# Patient Record
Sex: Female | Born: 1935 | Race: White | Hispanic: No | Marital: Married | Smoking: Never smoker
Health system: Southern US, Community
[De-identification: ages and names within clinical notes are randomized; demographics above are authoritative.]

## PROBLEM LIST (undated history)

## (undated) DIAGNOSIS — N281 Cyst of kidney, acquired: Secondary | ICD-10-CM

## (undated) DIAGNOSIS — I10 Essential (primary) hypertension: Secondary | ICD-10-CM

## (undated) DIAGNOSIS — E785 Hyperlipidemia, unspecified: Secondary | ICD-10-CM

## (undated) DIAGNOSIS — D649 Anemia, unspecified: Secondary | ICD-10-CM

## (undated) DIAGNOSIS — K449 Diaphragmatic hernia without obstruction or gangrene: Secondary | ICD-10-CM

## (undated) DIAGNOSIS — I2510 Atherosclerotic heart disease of native coronary artery without angina pectoris: Secondary | ICD-10-CM

## (undated) DIAGNOSIS — M545 Low back pain, unspecified: Secondary | ICD-10-CM

## (undated) DIAGNOSIS — R51 Headache: Secondary | ICD-10-CM

## (undated) DIAGNOSIS — F419 Anxiety disorder, unspecified: Secondary | ICD-10-CM

## (undated) DIAGNOSIS — K219 Gastro-esophageal reflux disease without esophagitis: Secondary | ICD-10-CM

## (undated) DIAGNOSIS — K5790 Diverticulosis of intestine, part unspecified, without perforation or abscess without bleeding: Secondary | ICD-10-CM

## (undated) DIAGNOSIS — T7840XA Allergy, unspecified, initial encounter: Secondary | ICD-10-CM

## (undated) DIAGNOSIS — I872 Venous insufficiency (chronic) (peripheral): Secondary | ICD-10-CM

## (undated) DIAGNOSIS — D126 Benign neoplasm of colon, unspecified: Secondary | ICD-10-CM

## (undated) DIAGNOSIS — K56609 Unspecified intestinal obstruction, unspecified as to partial versus complete obstruction: Secondary | ICD-10-CM

## (undated) DIAGNOSIS — M1990 Unspecified osteoarthritis, unspecified site: Secondary | ICD-10-CM

## (undated) DIAGNOSIS — G8929 Other chronic pain: Secondary | ICD-10-CM

## (undated) DIAGNOSIS — K589 Irritable bowel syndrome without diarrhea: Secondary | ICD-10-CM

## (undated) HISTORY — DX: Cyst of kidney, acquired: N28.1

## (undated) HISTORY — DX: Gastro-esophageal reflux disease without esophagitis: K21.9

## (undated) HISTORY — DX: Irritable bowel syndrome without diarrhea: K58.9

## (undated) HISTORY — DX: Low back pain, unspecified: M54.50

## (undated) HISTORY — DX: Hyperlipidemia, unspecified: E78.5

## (undated) HISTORY — DX: Low back pain: M54.5

## (undated) HISTORY — DX: Diaphragmatic hernia without obstruction or gangrene: K44.9

## (undated) HISTORY — DX: Headache: R51

## (undated) HISTORY — DX: Essential (primary) hypertension: I10

## (undated) HISTORY — DX: Anemia, unspecified: D64.9

## (undated) HISTORY — DX: Anxiety disorder, unspecified: F41.9

## (undated) HISTORY — DX: Unspecified osteoarthritis, unspecified site: M19.90

## (undated) HISTORY — DX: Atherosclerotic heart disease of native coronary artery without angina pectoris: I25.10

## (undated) HISTORY — DX: Diverticulosis of intestine, part unspecified, without perforation or abscess without bleeding: K57.90

## (undated) HISTORY — PX: ABDOMINAL HYSTERECTOMY: SHX81

## (undated) HISTORY — PX: VARICOSE VEIN SURGERY: SHX832

## (undated) HISTORY — PX: COLONOSCOPY W/ BIOPSIES: SHX1374

## (undated) HISTORY — DX: Benign neoplasm of colon, unspecified: D12.6

## (undated) HISTORY — DX: Allergy, unspecified, initial encounter: T78.40XA

## (undated) HISTORY — DX: Venous insufficiency (chronic) (peripheral): I87.2

## (undated) HISTORY — PX: BLADDER REPAIR: SHX76

## (undated) HISTORY — DX: Unspecified intestinal obstruction, unspecified as to partial versus complete obstruction: K56.609

## (undated) HISTORY — DX: Other chronic pain: G89.29

## (undated) HISTORY — PX: CATARACT EXTRACTION: SUR2

---

## 1997-09-10 ENCOUNTER — Ambulatory Visit (HOSPITAL_COMMUNITY): Admission: RE | Admit: 1997-09-10 | Discharge: 1997-09-10 | Payer: Self-pay | Admitting: *Deleted

## 1998-02-02 ENCOUNTER — Other Ambulatory Visit: Admission: RE | Admit: 1998-02-02 | Discharge: 1998-02-02 | Payer: Self-pay | Admitting: Obstetrics and Gynecology

## 1999-03-12 ENCOUNTER — Encounter: Admission: RE | Admit: 1999-03-12 | Discharge: 1999-03-12 | Payer: Self-pay | Admitting: Obstetrics and Gynecology

## 1999-03-12 ENCOUNTER — Encounter: Payer: Self-pay | Admitting: Obstetrics and Gynecology

## 1999-12-16 ENCOUNTER — Ambulatory Visit (HOSPITAL_COMMUNITY): Admission: RE | Admit: 1999-12-16 | Discharge: 1999-12-16 | Payer: Self-pay

## 2000-01-14 ENCOUNTER — Other Ambulatory Visit: Admission: RE | Admit: 2000-01-14 | Discharge: 2000-01-14 | Payer: Self-pay | Admitting: *Deleted

## 2000-03-13 ENCOUNTER — Encounter: Admission: RE | Admit: 2000-03-13 | Discharge: 2000-03-13 | Payer: Self-pay | Admitting: Obstetrics and Gynecology

## 2000-03-13 ENCOUNTER — Encounter: Payer: Self-pay | Admitting: Obstetrics and Gynecology

## 2000-06-30 ENCOUNTER — Encounter (INDEPENDENT_AMBULATORY_CARE_PROVIDER_SITE_OTHER): Payer: Self-pay | Admitting: Specialist

## 2000-06-30 ENCOUNTER — Other Ambulatory Visit: Admission: RE | Admit: 2000-06-30 | Discharge: 2000-06-30 | Payer: Self-pay | Admitting: Gastroenterology

## 2001-03-15 ENCOUNTER — Encounter: Payer: Self-pay | Admitting: Obstetrics and Gynecology

## 2001-03-15 ENCOUNTER — Encounter: Admission: RE | Admit: 2001-03-15 | Discharge: 2001-03-15 | Payer: Self-pay | Admitting: Obstetrics and Gynecology

## 2001-09-10 ENCOUNTER — Other Ambulatory Visit: Admission: RE | Admit: 2001-09-10 | Discharge: 2001-09-10 | Payer: Self-pay | Admitting: Obstetrics and Gynecology

## 2002-01-12 ENCOUNTER — Ambulatory Visit (HOSPITAL_COMMUNITY): Admission: RE | Admit: 2002-01-12 | Discharge: 2002-01-13 | Payer: Self-pay | Admitting: Cardiology

## 2002-01-12 ENCOUNTER — Encounter: Payer: Self-pay | Admitting: Cardiology

## 2002-01-12 HISTORY — PX: CARDIAC CATHETERIZATION: SHX172

## 2002-03-16 ENCOUNTER — Encounter: Payer: Self-pay | Admitting: Obstetrics and Gynecology

## 2002-03-16 ENCOUNTER — Encounter: Admission: RE | Admit: 2002-03-16 | Discharge: 2002-03-16 | Payer: Self-pay | Admitting: Obstetrics and Gynecology

## 2003-05-12 ENCOUNTER — Encounter: Admission: RE | Admit: 2003-05-12 | Discharge: 2003-05-12 | Payer: Self-pay | Admitting: Obstetrics and Gynecology

## 2003-09-26 ENCOUNTER — Encounter (INDEPENDENT_AMBULATORY_CARE_PROVIDER_SITE_OTHER): Payer: Self-pay | Admitting: Gastroenterology

## 2003-11-17 ENCOUNTER — Ambulatory Visit (HOSPITAL_COMMUNITY): Admission: RE | Admit: 2003-11-17 | Discharge: 2003-11-17 | Payer: Self-pay | Admitting: Gastroenterology

## 2003-11-18 ENCOUNTER — Emergency Department (HOSPITAL_COMMUNITY): Admission: EM | Admit: 2003-11-18 | Discharge: 2003-11-18 | Payer: Self-pay | Admitting: Emergency Medicine

## 2003-11-24 ENCOUNTER — Ambulatory Visit (HOSPITAL_COMMUNITY): Admission: RE | Admit: 2003-11-24 | Discharge: 2003-11-24 | Payer: Self-pay | Admitting: Gastroenterology

## 2004-02-14 ENCOUNTER — Ambulatory Visit: Payer: Self-pay | Admitting: Pulmonary Disease

## 2004-05-13 ENCOUNTER — Encounter: Admission: RE | Admit: 2004-05-13 | Discharge: 2004-05-13 | Payer: Self-pay | Admitting: Obstetrics and Gynecology

## 2004-08-20 ENCOUNTER — Ambulatory Visit: Payer: Self-pay | Admitting: Pulmonary Disease

## 2004-09-02 ENCOUNTER — Ambulatory Visit: Payer: Self-pay | Admitting: Pulmonary Disease

## 2005-02-04 ENCOUNTER — Ambulatory Visit: Payer: Self-pay | Admitting: Pulmonary Disease

## 2005-03-03 ENCOUNTER — Ambulatory Visit: Payer: Self-pay | Admitting: Pulmonary Disease

## 2005-04-30 ENCOUNTER — Ambulatory Visit: Payer: Self-pay | Admitting: Pulmonary Disease

## 2005-05-14 ENCOUNTER — Encounter: Admission: RE | Admit: 2005-05-14 | Discharge: 2005-05-14 | Payer: Self-pay | Admitting: Obstetrics and Gynecology

## 2005-07-14 ENCOUNTER — Ambulatory Visit: Payer: Self-pay | Admitting: Pulmonary Disease

## 2005-07-21 ENCOUNTER — Ambulatory Visit: Payer: Self-pay | Admitting: Pulmonary Disease

## 2005-10-21 ENCOUNTER — Ambulatory Visit: Payer: Self-pay | Admitting: Pulmonary Disease

## 2006-02-03 ENCOUNTER — Ambulatory Visit: Payer: Self-pay | Admitting: Pulmonary Disease

## 2006-02-13 ENCOUNTER — Ambulatory Visit: Payer: Self-pay | Admitting: Pulmonary Disease

## 2006-05-27 ENCOUNTER — Encounter: Admission: RE | Admit: 2006-05-27 | Discharge: 2006-05-27 | Payer: Self-pay | Admitting: Obstetrics and Gynecology

## 2006-07-07 ENCOUNTER — Ambulatory Visit: Payer: Self-pay | Admitting: Pulmonary Disease

## 2006-09-15 ENCOUNTER — Ambulatory Visit: Payer: Self-pay | Admitting: Internal Medicine

## 2006-12-11 ENCOUNTER — Ambulatory Visit: Payer: Self-pay | Admitting: Pulmonary Disease

## 2006-12-28 ENCOUNTER — Ambulatory Visit: Payer: Self-pay | Admitting: Pulmonary Disease

## 2007-02-22 ENCOUNTER — Ambulatory Visit: Payer: Self-pay | Admitting: Pulmonary Disease

## 2007-03-08 ENCOUNTER — Ambulatory Visit: Payer: Self-pay | Admitting: Pulmonary Disease

## 2007-03-22 ENCOUNTER — Encounter: Payer: Self-pay | Admitting: Pulmonary Disease

## 2007-03-22 HISTORY — PX: CARDIOVASCULAR STRESS TEST: SHX262

## 2007-05-07 ENCOUNTER — Encounter: Admission: RE | Admit: 2007-05-07 | Discharge: 2007-05-07 | Payer: Self-pay | Admitting: Specialist

## 2007-06-16 ENCOUNTER — Encounter: Admission: RE | Admit: 2007-06-16 | Discharge: 2007-06-16 | Payer: Self-pay | Admitting: Obstetrics and Gynecology

## 2007-07-19 ENCOUNTER — Ambulatory Visit: Payer: Self-pay | Admitting: Internal Medicine

## 2007-07-19 DIAGNOSIS — M545 Low back pain, unspecified: Secondary | ICD-10-CM | POA: Insufficient documentation

## 2007-07-19 DIAGNOSIS — F411 Generalized anxiety disorder: Secondary | ICD-10-CM

## 2007-07-19 DIAGNOSIS — Z9861 Coronary angioplasty status: Secondary | ICD-10-CM

## 2007-07-19 DIAGNOSIS — E785 Hyperlipidemia, unspecified: Secondary | ICD-10-CM

## 2007-07-19 DIAGNOSIS — I10 Essential (primary) hypertension: Secondary | ICD-10-CM

## 2007-07-19 DIAGNOSIS — I2510 Atherosclerotic heart disease of native coronary artery without angina pectoris: Secondary | ICD-10-CM

## 2007-07-19 DIAGNOSIS — R51 Headache: Secondary | ICD-10-CM

## 2007-07-19 DIAGNOSIS — R519 Headache, unspecified: Secondary | ICD-10-CM | POA: Insufficient documentation

## 2007-07-19 DIAGNOSIS — I872 Venous insufficiency (chronic) (peripheral): Secondary | ICD-10-CM | POA: Insufficient documentation

## 2007-07-19 DIAGNOSIS — M1990 Unspecified osteoarthritis, unspecified site: Secondary | ICD-10-CM | POA: Insufficient documentation

## 2007-07-21 ENCOUNTER — Telehealth: Payer: Self-pay | Admitting: Adult Health

## 2007-08-25 ENCOUNTER — Telehealth: Payer: Self-pay | Admitting: Internal Medicine

## 2007-08-27 ENCOUNTER — Telehealth: Payer: Self-pay | Admitting: Internal Medicine

## 2007-09-03 ENCOUNTER — Telehealth: Payer: Self-pay | Admitting: Internal Medicine

## 2007-10-06 ENCOUNTER — Ambulatory Visit: Payer: Self-pay | Admitting: Internal Medicine

## 2007-10-06 DIAGNOSIS — J309 Allergic rhinitis, unspecified: Secondary | ICD-10-CM

## 2007-11-04 ENCOUNTER — Telehealth: Payer: Self-pay | Admitting: Pulmonary Disease

## 2007-11-09 ENCOUNTER — Ambulatory Visit: Payer: Self-pay | Admitting: Pulmonary Disease

## 2007-11-16 LAB — CONVERTED CEMR LAB
Albumin: 3.9 g/dL (ref 3.5–5.2)
Basophils Absolute: 0 10*3/uL (ref 0.0–0.1)
Cholesterol: 154 mg/dL (ref 0–200)
Eosinophils Absolute: 0.1 10*3/uL (ref 0.0–0.7)
Eosinophils Relative: 2.6 % (ref 0.0–5.0)
HDL: 41.3 mg/dL (ref >39.0)
Hemoglobin, Urine: NEGATIVE
Leukocytes, UA: NEGATIVE
Lymphocytes Relative: 36 % (ref 12.0–46.0)
MCV: 92.1 fL (ref 78.0–100.0)
Monocytes Absolute: 0.4 10*3/uL (ref 0.1–1.0)
Monocytes Relative: 7.9 % (ref 3.0–12.0)
Neutro Abs: 2.8 10*3/uL (ref 1.4–7.7)
Nitrite: NEGATIVE
RDW: 14.1 % (ref 11.5–14.6)
Sodium: 144 meq/L (ref 135–145)
TSH: 1.89 microintl units/mL (ref 0.35–5.50)
Total Protein, Urine: NEGATIVE mg/dL
Total Protein: 6.5 g/dL (ref 6.0–8.3)
VLDL: 26 mg/dL (ref 0–40)
WBC: 5.2 10*3/uL (ref 4.5–10.5)

## 2007-11-18 ENCOUNTER — Ambulatory Visit: Payer: Self-pay | Admitting: Pulmonary Disease

## 2007-11-18 DIAGNOSIS — K5730 Diverticulosis of large intestine without perforation or abscess without bleeding: Secondary | ICD-10-CM | POA: Insufficient documentation

## 2007-11-18 DIAGNOSIS — Z860100 Personal history of colon polyps, unspecified: Secondary | ICD-10-CM | POA: Insufficient documentation

## 2007-11-18 DIAGNOSIS — N281 Cyst of kidney, acquired: Secondary | ICD-10-CM | POA: Insufficient documentation

## 2007-11-18 DIAGNOSIS — M810 Age-related osteoporosis without current pathological fracture: Secondary | ICD-10-CM | POA: Insufficient documentation

## 2007-12-30 ENCOUNTER — Telehealth: Payer: Self-pay | Admitting: Pulmonary Disease

## 2008-01-05 ENCOUNTER — Telehealth (INDEPENDENT_AMBULATORY_CARE_PROVIDER_SITE_OTHER): Payer: Self-pay | Admitting: *Deleted

## 2008-01-12 ENCOUNTER — Ambulatory Visit: Payer: Self-pay | Admitting: Pulmonary Disease

## 2008-01-25 ENCOUNTER — Telehealth (INDEPENDENT_AMBULATORY_CARE_PROVIDER_SITE_OTHER): Payer: Self-pay | Admitting: *Deleted

## 2008-03-09 ENCOUNTER — Ambulatory Visit: Payer: Self-pay | Admitting: Pulmonary Disease

## 2008-03-09 DIAGNOSIS — R059 Cough, unspecified: Secondary | ICD-10-CM | POA: Insufficient documentation

## 2008-03-09 DIAGNOSIS — R05 Cough: Secondary | ICD-10-CM | POA: Insufficient documentation

## 2008-04-25 ENCOUNTER — Telehealth (INDEPENDENT_AMBULATORY_CARE_PROVIDER_SITE_OTHER): Payer: Self-pay | Admitting: *Deleted

## 2008-05-08 ENCOUNTER — Telehealth: Payer: Self-pay | Admitting: Pulmonary Disease

## 2008-05-16 ENCOUNTER — Telehealth: Payer: Self-pay | Admitting: Pulmonary Disease

## 2008-06-23 ENCOUNTER — Encounter: Admission: RE | Admit: 2008-06-23 | Discharge: 2008-06-23 | Payer: Self-pay | Admitting: Obstetrics and Gynecology

## 2008-07-10 ENCOUNTER — Encounter: Payer: Self-pay | Admitting: Pulmonary Disease

## 2008-07-28 ENCOUNTER — Telehealth (INDEPENDENT_AMBULATORY_CARE_PROVIDER_SITE_OTHER): Payer: Self-pay | Admitting: *Deleted

## 2008-08-03 ENCOUNTER — Telehealth (INDEPENDENT_AMBULATORY_CARE_PROVIDER_SITE_OTHER): Payer: Self-pay | Admitting: *Deleted

## 2008-08-03 ENCOUNTER — Ambulatory Visit: Payer: Self-pay | Admitting: Internal Medicine

## 2008-08-10 ENCOUNTER — Encounter: Payer: Self-pay | Admitting: Pulmonary Disease

## 2008-08-17 ENCOUNTER — Encounter: Payer: Self-pay | Admitting: Internal Medicine

## 2008-08-17 ENCOUNTER — Ambulatory Visit: Payer: Self-pay | Admitting: Internal Medicine

## 2008-08-17 HISTORY — PX: COLONOSCOPY: SHX174

## 2008-08-29 ENCOUNTER — Encounter: Payer: Self-pay | Admitting: Internal Medicine

## 2008-08-31 ENCOUNTER — Telehealth (INDEPENDENT_AMBULATORY_CARE_PROVIDER_SITE_OTHER): Payer: Self-pay | Admitting: *Deleted

## 2008-09-01 ENCOUNTER — Ambulatory Visit: Payer: Self-pay | Admitting: Internal Medicine

## 2008-09-04 DIAGNOSIS — J069 Acute upper respiratory infection, unspecified: Secondary | ICD-10-CM | POA: Insufficient documentation

## 2008-09-05 ENCOUNTER — Telehealth: Payer: Self-pay | Admitting: Pulmonary Disease

## 2008-09-26 ENCOUNTER — Telehealth (INDEPENDENT_AMBULATORY_CARE_PROVIDER_SITE_OTHER): Payer: Self-pay | Admitting: *Deleted

## 2008-12-14 ENCOUNTER — Ambulatory Visit: Payer: Self-pay | Admitting: Pulmonary Disease

## 2008-12-19 ENCOUNTER — Ambulatory Visit: Payer: Self-pay | Admitting: Pulmonary Disease

## 2008-12-20 LAB — CONVERTED CEMR LAB
Alkaline Phosphatase: 103 units/L (ref 39–117)
Basophils Absolute: 0 10*3/uL (ref 0.0–0.1)
Basophils Relative: 0.6 % (ref 0.0–3.0)
CO2: 31 meq/L (ref 19–32)
Calcium: 9.1 mg/dL (ref 8.4–10.5)
Chloride: 106 meq/L (ref 96–112)
Cholesterol: 211 mg/dL — ABNORMAL HIGH (ref 0–200)
Direct LDL: 160.4 mg/dL
GFR calc non Af Amer: 86.26 mL/min (ref >60)
HDL: 37.4 mg/dL — ABNORMAL LOW (ref >39.00)
Lymphocytes Relative: 30.9 % (ref 12.0–46.0)
Lymphs Abs: 2.1 10*3/uL (ref 0.7–4.0)
Monocytes Absolute: 0.5 10*3/uL (ref 0.1–1.0)
Monocytes Relative: 7.8 % (ref 3.0–12.0)
Neutro Abs: 4 10*3/uL (ref 1.4–7.7)
Neutrophils Relative %: 57.8 % (ref 43.0–77.0)
Platelets: 224 10*3/uL (ref 150.0–400.0)
Potassium: 4.2 meq/L (ref 3.5–5.1)
RBC: 4.76 M/uL (ref 3.87–5.11)
Sodium: 143 meq/L (ref 135–145)
TSH: 1.07 microintl units/mL (ref 0.35–5.50)
Total Bilirubin: 0.9 mg/dL (ref 0.3–1.2)
Total CHOL/HDL Ratio: 6
VLDL: 27.2 mg/dL (ref 0.0–40.0)

## 2008-12-25 ENCOUNTER — Telehealth: Payer: Self-pay | Admitting: Pulmonary Disease

## 2009-01-15 ENCOUNTER — Telehealth (INDEPENDENT_AMBULATORY_CARE_PROVIDER_SITE_OTHER): Payer: Self-pay | Admitting: *Deleted

## 2009-01-22 ENCOUNTER — Telehealth: Payer: Self-pay | Admitting: Internal Medicine

## 2009-02-08 ENCOUNTER — Ambulatory Visit: Payer: Self-pay | Admitting: Pulmonary Disease

## 2009-02-28 ENCOUNTER — Ambulatory Visit: Payer: Self-pay | Admitting: Internal Medicine

## 2009-02-28 ENCOUNTER — Telehealth: Payer: Self-pay | Admitting: Internal Medicine

## 2009-04-30 ENCOUNTER — Telehealth: Payer: Self-pay | Admitting: Internal Medicine

## 2009-05-14 ENCOUNTER — Ambulatory Visit: Payer: Self-pay | Admitting: Pulmonary Disease

## 2009-05-15 LAB — CONVERTED CEMR LAB
ALT: 21 units/L (ref 0–35)
AST: 24 units/L (ref 0–37)
CO2: 31 meq/L (ref 19–32)
Creatinine, Ser: 0.8 mg/dL (ref 0.4–1.2)
GFR calc non Af Amer: 73.87 mL/min (ref >60)
Glucose, Bld: 102 mg/dL — ABNORMAL HIGH (ref 70–99)
HDL: 49.6 mg/dL (ref >39.00)
Total CHOL/HDL Ratio: 3
VLDL: 24.6 mg/dL (ref 0.0–40.0)

## 2009-06-04 ENCOUNTER — Telehealth: Payer: Self-pay | Admitting: Pulmonary Disease

## 2009-06-13 ENCOUNTER — Encounter: Payer: Self-pay | Admitting: Pulmonary Disease

## 2009-06-26 ENCOUNTER — Encounter: Admission: RE | Admit: 2009-06-26 | Discharge: 2009-06-26 | Payer: Self-pay | Admitting: Obstetrics and Gynecology

## 2009-06-26 ENCOUNTER — Telehealth: Payer: Self-pay | Admitting: Pulmonary Disease

## 2009-08-06 ENCOUNTER — Telehealth (INDEPENDENT_AMBULATORY_CARE_PROVIDER_SITE_OTHER): Payer: Self-pay | Admitting: *Deleted

## 2009-09-05 ENCOUNTER — Encounter: Payer: Self-pay | Admitting: Pulmonary Disease

## 2009-09-26 ENCOUNTER — Telehealth: Payer: Self-pay | Admitting: Pulmonary Disease

## 2009-10-11 ENCOUNTER — Telehealth: Payer: Self-pay | Admitting: Pulmonary Disease

## 2009-11-09 ENCOUNTER — Telehealth: Payer: Self-pay | Admitting: Pulmonary Disease

## 2009-11-20 ENCOUNTER — Emergency Department (HOSPITAL_COMMUNITY): Admission: EM | Admit: 2009-11-20 | Discharge: 2009-11-20 | Payer: Self-pay | Admitting: Emergency Medicine

## 2010-02-05 ENCOUNTER — Ambulatory Visit: Payer: Self-pay | Admitting: Pulmonary Disease

## 2010-02-06 ENCOUNTER — Ambulatory Visit: Payer: Self-pay | Admitting: Pulmonary Disease

## 2010-02-07 LAB — CONVERTED CEMR LAB
ALT: 19 units/L (ref 0–35)
Alkaline Phosphatase: 111 units/L (ref 39–117)
Basophils Absolute: 0 10*3/uL (ref 0.0–0.1)
Basophils Relative: 0.5 % (ref 0.0–3.0)
Bilirubin, Direct: 0.1 mg/dL (ref 0.0–0.3)
CO2: 29 meq/L (ref 19–32)
Chloride: 107 meq/L (ref 96–112)
Cholesterol: 146 mg/dL (ref 0–200)
Eosinophils Absolute: 0.2 10*3/uL (ref 0.0–0.7)
Eosinophils Relative: 2.2 % (ref 0.0–5.0)
Glucose, Bld: 101 mg/dL — ABNORMAL HIGH (ref 70–99)
LDL Cholesterol: 84 mg/dL (ref 0–99)
Lymphocytes Relative: 29.8 % (ref 12.0–46.0)
Lymphs Abs: 2.1 10*3/uL (ref 0.7–4.0)
MCHC: 34.4 g/dL (ref 30.0–36.0)
Monocytes Absolute: 0.5 10*3/uL (ref 0.1–1.0)
Monocytes Relative: 7.1 % (ref 3.0–12.0)
Neutro Abs: 4.2 10*3/uL (ref 1.4–7.7)
Neutrophils Relative %: 60.4 % (ref 43.0–77.0)
Potassium: 6.1 meq/L (ref 3.5–5.1)
RDW: 14.8 % — ABNORMAL HIGH (ref 11.5–14.6)
Sodium: 143 meq/L (ref 135–145)
Total CHOL/HDL Ratio: 4
Total Protein: 6.1 g/dL (ref 6.0–8.3)
Triglycerides: 120 mg/dL (ref 0.0–149.0)
Vit D, 25-Hydroxy: 45 ng/mL (ref 30–89)

## 2010-03-20 ENCOUNTER — Telehealth: Payer: Self-pay | Admitting: Pulmonary Disease

## 2010-05-02 NOTE — Progress Notes (Signed)
Summary: cough  Phone Note Call from Patient   Caller: Patient Call For: Geni Skorupski Summary of Call: need something for cough would like hydrocodone [facility] Initial call taken by: Rickard Patience,  September 26, 2009 1:49 PM  Follow-up for Phone Call        called and spoke with pt and she stated that she still has a cough--dry x 2-3 weeks-- requesting rx for hydromet to be called in.   please advise if ok for the hydromet.  thanks Randell Loop CMA  September 26, 2009 1:54 PM   Additional Follow-up for Phone Call Additional follow up Details #1::        per SN---ok for pt to have hydromet cough meds---#4oz  1 tsp by mouth every 6 hours as needed for cough.  refill x 2.  this has been called into the pharmacy.  called and spoke with pt and she is aware of meds at pharmacy Randell Loop CMA  September 26, 2009 4:16 PM      New/Updated Medications: HYDROMET 5-1.5 MG/5ML SYRP (HYDROCODONE-HOMATROPINE) 1 tsp by mouth every 6 hours as needed for cough Prescriptions: HYDROMET 5-1.5 MG/5ML SYRP (HYDROCODONE-HOMATROPINE) 1 tsp by mouth every 6 hours as needed for cough  #4 oz x 2   Entered by:   Randell Loop CMA   Authorized by:   Michele Mcalpine MD   Signed by:   Randell Loop CMA on 09/26/2009   Method used:   Telephoned to ...       [id number] (retail)       [address]       [place], [place]  [address]       Ph: [phone] or [phone]       Fax: [phone]   RxID:   [id number]

## 2010-05-02 NOTE — Progress Notes (Signed)
Summary: reaction to med  Phone Note Call from Patient   Caller: Patient Call For: nadel Summary of Call: generic for zocor is causing leg cramps and swelling have not taken in 2 days Initial call taken by: Rickard Patience,  Aug 06, 2009 9:06 AM  Follow-up for Phone Call        Spoke with pt.  She states that she has been taking zocor x several months and has been having leg cramps ever since.  Also, she has noticed some swelling in her ankles for the past 2 wks and wants to know if this is coming from med.  She wants to switch back to crestor.  Please advise, thanks Follow-up by: Vernie Murders,  Aug 06, 2009 9:17 AM  Additional Follow-up for Phone Call Additional follow up Details #1::        per SN=---ok to stop the zocor--switch back to crestor---use previous dose please- and call this in for pt.--for the swelling  no salt, elevate legs and use the support hose.  thanks Randell Loop CMA  Aug 06, 2009 11:00 AM     Additional Follow-up for Phone Call Additional follow up Details #2::    Spoke with pt and advised of recs per SN.  Pt verbalized understanding.  Crestor was sent to [facility]. Follow-up by: Vernie Murders,  Aug 06, 2009 11:05 AM  New/Updated Medications: CRESTOR 10 MG TABS (ROSUVASTATIN CALCIUM) 1 by mouth at bedtime Prescriptions: CRESTOR 10 MG TABS (ROSUVASTATIN CALCIUM) 1 by mouth at bedtime  #90 x 3   Entered by:   Vernie Murders   Authorized by:   Michele Mcalpine MD   Signed by:   Vernie Murders on 08/06/2009   Method used:   Electronically to        [facility]* (mail-order)             ,          Ph: [phone]       Fax: [phone]   RxID:   [id number]

## 2010-05-02 NOTE — Assessment & Plan Note (Signed)
Summary: 9 MONTH FOLLOW UP/LA   Primary Care Provider:  Alroy Dust, MD  CC:  9 month ROV & review of mult medical problems....  History of Present Illness: 77 y/o [name] here for a follow up visit... she has multiple medical problems as noted below...    ~  May 14, 2009:  add-on w/ cough, congestion, & yellow sinus drainage... denies fever, no c/s, no CP or SOB, etc... also c/o legs aching "from the Simvastatin 20mg "- but it is tolerable & she had worse symptoms on mult prev statins in the past... BP controlled on meds;  cardiac f/u Krystal [name]- stable;  GI eval Krystal [name] for GERD & bloating (Pantoprazole & Bentyl help)...   ~  February 05, 2010:  she's had a good yr- no new complaints or concerns... saw Krystal [name] 6/11- stable, no angina, BP controlled, no changes made...     Current Problem List   ALLERGIC RHINITIS (ICD-477.9) - she uses OTC antihistamines + saline nasal spray Prn... "I have alot of mucous"- Rx w/ antihist, mucinex, cough syrups (wants Tussionex)... she had some left ear discomfort, dry skin & DrBates suggested mineral oil drop or two on occas Prn.  HYPERTENSION (ICD-401.9) - controlled on TOPROL XL 25mg /d, & NORVASC 10mg /d... BP= 130/86 here today & even better at home- feeling well, tolerating meds well... denies HA, fatigue, visual changes, CP, palipit, dizziness, syncope, dyspnea, edema, etc...  ~  CXR 12/09 showed some hyperaeration w/ flattening of diaphragms, NAD...  CORONARY ARTERY DISEASE (ICD-414.00) - followed by Krystal [name] & [facility]... on the above + ASA 81mg /d, & Statin for her Chol... she denies CP, palpit, change in SOB, edema, etc...  ~  baseline EKG shows a chr LBBB...  ~  cath 10/03 w/ single vessel CAD- 90%CIRC treated w/ PTCA/ stent...  ~  Cardiolite 10/06 showed no ischemia... repeat 12/08 showed no change- no ischemia, +diaph attenuation, EF= 64% w/ LBBB...  ~  2DEcho 12/08 showed mild conc LVH, mild calcif AoV, no stenosis, RVsys sl up at 40-50  mmHg...  ~  2DEcho 4/10 showed borderline conc LVH, norm LVF w/ EF= 55%, mild LA dil, mod MR, RVsys=42...  VENOUS INSUFFICIENCY (ICD-459.81) - she knows to elim sodium, elevate legs, wear support hose (she is not on a diuretic)... she had [facility] surg in 2001 by [facility]...  HYPERLIPIDEMIA (ICD-272.4) -  insurance company will not cover Crestor, only generics... she notes musc "cramps" on mult statins prev... now on SIMVASTATIN 20mg /d w/ some symptoms but tolerable...  ~  FLP 8/09 on Crestor10 = TChol 154, Tg 132, HDL 41, LDL 86 (goal is <70 for secondary prevent).  ~  FLP 9/10 on Prav20 showed TChol 211, TG 136, HDL 37, LDL 160... rec> try to incr Prav to 40mg /d> intol.  ~  FLP 2/11 on Simva20 showed TChol 168, TG 123, HDL 50, LDL 94... continue same.  ~  FLP 11/11 on Simva20 showed TChol 146, TG 120, HDL 38, LDL 84  HIATAL HERNIA (ICD-553.3) & GERD (ICD-530.81) - followed by Krystal [name] on ?LANTOPRAZOLE 15-30mg /d  ~  last EGD 1/98 showed 3-4cmHH, mod reflux esophagitis, mild duodenitis...   ~  12/10:  seen by GI w/ GERD & bloating- Rx w/ Pantoprazole & Bentyl...  DIVERTICULOSIS OF COLON (ICD-562.10) & COLONIC POLYPS (ICD-211.3) - on BENTYL 20mg  Prn.  ~  colonoscopy 6/05 by Krystal [name] showed divertics and two 4-58mm polyps= adenomatous...  ~  colonosco[py 5/10 by Krystal [name] showed divertics, hems, tiny cecal polyp= adenoma... f/u planned 3yrs.  RENAL CYST (ICD-593.2) - [facility]) and last sonar 4/08 was stable...  OSTEOARTHRITIS (ICD-715.90) - eval by Krystal [name] 2/09 w/ stress fracture to 4th metatarsal left foot... she takes MOBIC 7.5mg  Prn for hand arthritis & prev saw DrMyerdierks for this... ***she notes that the Mobic helps her BP!  LOW BACK PAIN SYNDROME (ICD-724.2) - she states that her prev LBP has resolved and she is active w/ exercise 3-4 days per week... she had a partial T7 & T8 compression fractures noted in 2003... she took Forteo but only for  8 months per her GYN.  OSTEOPOROSIS (ICD-733.00) - as above>  this is managed and followed by her GYN Krystal Dus, NP) w/ BMD's and currently on calcium and VitD 2000 daily... we donot have copies of her BMD's...  HEADACHE (ICD-784.0)  ANXIETY (ICD-300.00) - on LORAZEPAM 1mg - 1/2 to 1 tab three times a day as needed.  ANEMIA (ICD-285.9) - hx of anemia in the past... Hg= 14.6 on 11/09/07...  Health Maintenance - she had Pneumovax in the past w/ a reaction that she indicates lasted for 76yr...   Preventive Screening-Counseling & Management  Alcohol-Tobacco     Smoking Status: never  Allergies: 1)  ! Penicillin 2)  ! Prilosec (Omeprazole) 3)  ! Nexium (Esomeprazole Magnesium) 4)  ! Fosamax (Alendronate Sodium) 5)  ! * Cefdinir 6)  ! Levaquin 7)  ! * Shellfish  Comments:  Nurse/Medical Assistant: The patient's medications and allergies were reviewed with the patient and were updated in the Medication and Allergy Lists.  Past History:  Past Medical History: ALLERGIC RHINITIS (ICD-477.9) HYPERTENSION (ICD-401.9) CORONARY ARTERY DISEASE (ICD-414.00) VENOUS INSUFFICIENCY (ICD-459.81) HYPERLIPIDEMIA (ICD-272.4) HIATAL HERNIA (ICD-553.3) GERD (ICD-530.81) DIVERTICULOSIS OF COLON (ICD-562.10) COLONIC POLYPS (ICD-211.3) RENAL CYST (ICD-593.2) OSTEOARTHRITIS (ICD-715.90) LOW BACK PAIN SYNDROME (ICD-724.2) OSTEOPOROSIS (ICD-733.00) HEADACHE (ICD-784.0) ANXIETY (ICD-300.00) ANEMIA (ICD-285.9)   Past Surgical History: S/P hysterectomy Varicous veins Bladder Repair  Family History: Reviewed history from 02/28/2009 and no changes required. Family History of Heart Disease: Father Mother died at 30 of Natural causes  Social History: Reviewed history from 02/28/2009 and no changes required. Occupation: Retired Patient has never smoked.  Alcohol Use - yes Illicit Drug Use - no  Review of Systems      See HPI  The patient denies anorexia, fever, weight loss, weight gain,  vision loss, decreased hearing, hoarseness, chest pain, syncope, dyspnea on exertion, peripheral edema, prolonged cough, headaches, hemoptysis, abdominal pain, melena, hematochezia, severe indigestion/heartburn, hematuria, incontinence, muscle weakness, suspicious skin lesions, transient blindness, difficulty walking, depression, unusual weight change, abnormal bleeding, enlarged lymph nodes, and angioedema.    Vital Signs:  Patient profile:   77 year old female Height:      65 inches Weight:      164 pounds BMI:     27.39 O2 Sat:      96 % on Room air Temp:     97.2 degrees F oral Pulse rate:   60 / minute BP sitting:   130 / 72  (left arm) Cuff size:   regular  Vitals Entered By: Randell Loop CMA (February 05, 2010 1:55 PM)  O2 Sat at Rest %:  96 O2 Flow:  Room air CC: 9 month ROV & review of mult medical problems... Is Patient Diabetic? No Pain Assessment Patient in pain? no      Comments meds updated today with pt   Physical Exam  Additional Exam:  WD, WN, 77 y/o [name]  in NAD... GENERAL:  Alert & oriented; pleasant & cooperative... HEENT:  [place]/AT, EOM-wnl, PERRLA, EACs-clear, TMs-wnl, NOSE-sl drainage, THROAT-clear & wnl. NECK:  Supple w/ fairROM; no JVD; normal carotid impulses w/o bruits; no thyromegaly or nodules palpated; no lymphadenopathy. CHEST:  Clear to P & A; without wheezes/ rales/ or rhonchi heard... HEART:  Regular Rhythm; gr 1/6 SEM at LSB, no rubs, no gallops apprec... ABDOMEN:  Soft & nontender; normal bowel sounds; no organomegaly or masses detected. EXT: without deformities, mild arthritic changes; hx varicose veins/ +venous insuffic/ no edema. no muscle tenderness etc... NEURO:  CN's intact; no focal neuro deficits... DERM:  No lesions noted; no rash etc...    MISC. Report  Procedure date:  02/06/2010  Findings:      Lipid Panel (LIPID)   Cholesterol               146 mg/dL                   1-610   Triglycerides             120.0 mg/dL                  9.6-045.4   HDL                  [L]  09.81 mg/dL                 >19.14   LDL Cholesterol           84 mg/dL                    7-82  BMP (METABOL)   Sodium                    143 mEq/L                   135-145   Potassium            [HH] 6.1 mEq/L                   3.5-5.1   Chloride                  107 mEq/L                   96-112   Carbon Dioxide            29 mEq/L                    19-32   Glucose              [H]  101 mg/dL                   95-62   BUN                       18 mg/dL                    1-30   Creatinine                0.9 mg/dL                   8.6-5.7   Calcium                   9.2 mg/dL  8.4-10.5   GFR                       68.74 mL/min                >60  Hepatic/Liver Function Panel (HEPATIC)   Total Bilirubin           0.6 mg/dL                   7.4-2.5   Direct Bilirubin          0.1 mg/dL                   9.5-6.3   Alkaline Phosphatase      111 U/L                     39-117   AST                       25 U/L                      0-37   ALT                       19 U/L                      0-35   Total Protein             6.1 g/dL                    8.7-5.6   Albumin                   3.8 g/dL                    4.3-3.2  Comments:      CBC Platelet w/Diff (CBCD)   White Cell Count          6.9 K/uL                    4.5-10.5   Red Cell Count            4.40 Mil/uL                 3.87-5.11   Hemoglobin                14.4 g/dL                   95.1-88.4   Hematocrit                41.8 %                      36.0-46.0   MCV                       95.0 fl                     78.0-100.0   Platelet Count            211.0 K/uL                  150.0-400.0   Neutrophil %              60.4 %  43.0-77.0   Lymphocyte %              29.8 %                      12.0-46.0   Monocyte %                7.1 %                       3.0-12.0   Eosinophils%              2.2 %                       0.0-5.0   Basophils  %               0.5 %                       0.0-3.0  TSH (TSH)   FastTSH                   1.72 uIU/mL                 0.35-5.50  Vitamin D (25-Hydroxy) ([address])  Vitamin D (25-Hydroxy)                             45 ng/mL                    30-89   Impression & Recommendations:  Problem # 1:  ALLERGIC RHINITIS (ICD-477.9) Meds refilled>  & she wants Tussionex...  Problem # 2:  HYPERTENSION (ICD-401.9) Controlled>  same Rx... Her updated medication list for this problem includes:    Toprol Xl 25 Mg Tb24 (Metoprolol succinate) .Marland Kitchen... Take 1 tablet by mouth once a day    Norvasc 10 Mg Tabs (Amlodipine besylate) .Marland Kitchen... Take 1 tablet by mouth once a day  Problem # 3:  CORONARY ARTERY DISEASE (ICD-414.00) Followed by DrRW>  stable, no angina, same meds. Her updated medication list for this problem includes:    Aspirin 81 Mg Tabs (Aspirin) .Marland Kitchen... Take one by mouth once daily    Toprol Xl 25 Mg Tb24 (Metoprolol succinate) .Marland Kitchen... Take 1 tablet by mouth once a day    Norvasc 10 Mg Tabs (Amlodipine besylate) .Marland Kitchen... Take 1 tablet by mouth once a day  Problem # 4:  HYPERLIPIDEMIA (ICD-272.4) Stable on the Simva20... Her updated medication list for this problem includes:    Zocor 20 Mg Tabs (Simvastatin) .Marland Kitchen... Take one tablet by mouth at bedtime  Problem # 5:  GERD (ICD-530.81) GI is stable on meds>  continue same. Her updated medication list for this problem includes:    Prevacid 30 Mg Cpdr (Lansoprazole) .Marland Kitchen... Take 1 tablet by mouth once a day    Dicyclomine Hcl 10 Mg Caps (Dicyclomine hcl) .Marland Kitchen... 1 by mouth q 6 hours as needed pain  Problem # 6:  OSTEOARTHRITIS (ICD-715.90) Stable on the Mobic>  reminded to use just Prn... Her updated medication list for this problem includes:    Aspirin 81 Mg Tabs (Aspirin) .Marland Kitchen... Take one by mouth once daily    Meloxicam 7.5 Mg Tabs (Meloxicam) .Marland Kitchen... Take one by mouth once daily as needed pain in joints  Problem # 7:  ANXIETY (ICD-300.00) Meds  refilled... Her updated medication list for this problem includes:  Lorazepam 1 Mg Tabs (Lorazepam) .Marland Kitchen... Take 1 tab by mouth at bedtime as needed for sleep...  Problem # 8:  OTHER MEDICAL PROBLEMS AS NOTED>>>  Complete Medication List: 1)  Mucinex Maximum Strength 1200 Mg Xr12h-tab (Guaifenesin) .... Take 1 tab by mouth two times a day w/ plenty of fluids.Marland KitchenMarland Kitchen 2)  Aspirin 81 Mg Tabs (Aspirin) .... Take one by mouth once daily 3)  Toprol Xl 25 Mg Tb24 (Metoprolol succinate) .... Take 1 tablet by mouth once a day 4)  Norvasc 10 Mg Tabs (Amlodipine besylate) .... Take 1 tablet by mouth once a day 5)  Zocor 20 Mg Tabs (Simvastatin) .... Take one tablet by mouth at bedtime 6)  Prevacid 30 Mg Cpdr (Lansoprazole) .... Take 1 tablet by mouth once a day 7)  Dicyclomine Hcl 10 Mg Caps (Dicyclomine hcl) .Marland Kitchen.. 1 by mouth q 6 hours as needed pain 8)  Meloxicam 7.5 Mg Tabs (Meloxicam) .... Take one by mouth once daily as needed pain in joints 9)  Caltrate 600+d 600-400 Mg-unit Tabs (Calcium carbonate-vitamin d) .... One tablet twice daily 10)  Vitamin D3 2000 Unit Caps (Cholecalciferol) .... Take one by mouth once daily 11)  Lorazepam 1 Mg Tabs (Lorazepam) .... Take 1 tab by mouth at bedtime as needed for sleep... 12)  Tussionex Pennkinetic Er 10-8 Mg/60ml Lqcr (Hydrocod polst-chlorphen polst) .Marland Kitchen.. 1 tsp by mouth every 12 h as needed for cough...  Other Orders: Influenza Vaccine NON MCR ([address])  Patient Instructions: 1)  Today we updated your med list- see below.... 2)  We refilled the meds your equested... 3)  Keep up the good job w/ diet + exercise... 4)  Please return to our lab one morning this week for your FASTING labs... then please call the "phone tree" in a few days for your lab results.Marland KitchenMarland Kitchen  5)  Please schedule a follow-up appointment in 1 year, sooner as needed. Prescriptions: TUSSIONEX PENNKINETIC ER 10-8 MG/5ML LQCR (HYDROCOD POLST-CHLORPHEN POLST) 1 tsp by mouth every 12 H as needed for  cough...  #4 oz x 12   Entered and Authorized by:   Michele Mcalpine MD   Signed by:   Michele Mcalpine MD on 02/05/2010   Method used:   Print then Give to Patient   RxID:   [id number] LORAZEPAM 1 MG  TABS (LORAZEPAM) take 1 tab by mouth at bedtime as needed for sleep...  #90 x 4   Entered and Authorized by:   Michele Mcalpine MD   Signed by:   Michele Mcalpine MD on 02/05/2010   Method used:   Print then Give to Patient   RxID:   [id number] MELOXICAM 7.5 MG  TABS (MELOXICAM) Take one by mouth once daily as needed pain in joints  #90 x 4   Entered and Authorized by:   Michele Mcalpine MD   Signed by:   Michele Mcalpine MD on 02/05/2010   Method used:   Print then Give to Patient   RxID:   [id number]    Immunizations Administered:  Influenza Vaccine # 1:    Vaccine Type: Fluvax Non-MCR    Site: left deltoid    Mfr: novartis    Dose: 0.5 ml    Route: IM    Given by: Randell Loop CMA    Exp. Date: 08/2010    Lot #: [id number]    VIS given: 10/23/09 version given February 05, 2010.  Flu Vaccine Consent Questions:    Do  you have a history of severe allergic reactions to this vaccine? no    Any prior history of allergic reactions to egg and/or gelatin? no    Do you have a sensitivity to the preservative Thimersol? no    Do you have a past history of Guillan-Barre Syndrome? no    Do you currently have an acute febrile illness? no    Have you ever had a severe reaction to latex? no    Vaccine information given and explained to patient? yes    Are you currently pregnant? no

## 2010-05-02 NOTE — Progress Notes (Signed)
Summary: sinus problem  Phone Note Call from Patient   Caller: Patient Call For: nadel Summary of Call: sinus problem would like something called to pharmacy [facility] Initial call taken by: Rickard Patience,  November 09, 2009 11:30 AM  Follow-up for Phone Call        Spoke with pt who c/o pain across sinuses and in forehead and prod cough with thick, clear mucus "off and on all summer."  Using netti pot, saline nasal spray with little relief.  [facility]ing [facility]  States she really thinks she needs prednsione.  Will forward to Dr. Kriste Basque, pls advise.  Gweneth Dimitri RN  November 09, 2009 11:53 AM    Additional Follow-up for Phone Call Additional follow up Details #1::        per SN---zpak  #1  sterapred dosepak  10mg    6 day pack  take as directed.  thanks Randell Loop CMA  November 09, 2009 12:43 PM     Additional Follow-up for Phone Call Additional follow up Details #2::    called and spoke with pt and she is aware of meds sent to the pharmacy. Randell Loop CMA  November 09, 2009 2:25 PM   New/Updated Medications: ZITHROMAX Z-PAK 250 MG TABS (AZITHROMYCIN) take as directed PREDNISONE (PAK) 10 MG TABS (PREDNISONE) take as directed---needs a 6 day pack Prescriptions: PREDNISONE (PAK) 10 MG TABS (PREDNISONE) take as directed---needs a 6 day pack  #1 x 0   Entered by:   Randell Loop CMA   Authorized by:   Michele Mcalpine MD   Signed by:   Randell Loop CMA on 11/09/2009   Method used:   Electronically to        [id number] ([facility]ail)       [address]       [place], [place]  [address]       Ph: [phone] or [phone]       Fax: [phone]   RxID:   [id number] ZITHROMAX Z-PAK 250 MG TABS (AZITHROMYCIN) take as directed  #1 x 0   Entered by:   Vernie Murders   Authorized by:   Michele Mcalpine MD   Signed by:   Vernie Murders on 11/09/2009   Method used:   Electronically to        [id number] ([facility]ail)       [address]  [place], [place]  [address]       Ph: [phone] or [phone]       Fax: [phone]   RxID:   [id number]

## 2010-05-02 NOTE — Progress Notes (Signed)
Summary: exposure to sick friend  Phone Note Call from Patient Call back at [facility] Phone [phone]   Caller: Patient Call For: Kidada Ging Summary of Call: pt was with a friend sun who ended up at the er c/o diarrhea/ vomiting. pt is "fine" but wants to know what she can do to keep from getting this. (i explained that if she were to get this from her friend who she was only around for 10 mins) there's nothing she can do. however, she needs to juast be vigilant re: hand washing, etc. but pt wants to hear from nurse as to how she can boost her immune system, etc.  Initial call taken by: Tivis Ringer, CNA,  June 04, 2009 11:33 AM  Follow-up for Phone Call        Advised pt there was nothing she really could do if she has already been exposed, but she could increase Vit C in  her diet, eat well balanced diet with plenty of veggies and fruit. pt states she is taking airborne. I advised this was fine because it had vit c in it. also advised to make sure she washes her hands instead of using alcohol gel. She states she is doins so. Carron Curie CMA  June 04, 2009 11:50 AM

## 2010-05-02 NOTE — Progress Notes (Signed)
Summary: triage  Medications Added HYOSCYAMINE SULFATE CR 0.375 MG XR12H-TAB (HYOSCYAMINE SULFATE) 1 by mouth two times a day       Phone Note Call from Patient Call back at [facility] Phone [phone]   Caller: Patient Call For: Leone Payor Reason for Call: Talk to Nurse Summary of Call: Flare up diverticulities Initial call taken by: Tawni Levy,  April 30, 2009 8:52 AM  Follow-up for Phone Call        patient having some lower abdominal cramping and has an old expired rx for hyoscyamine 0.375.  Patient  is requesting a refill of this.  [facility] Follow-up by: Darcey Nora RN, CGRN,  April 30, 2009 9:01 AM    New/Updated Medications: HYOSCYAMINE SULFATE CR 0.375 MG XR12H-TAB (HYOSCYAMINE SULFATE) 1 by mouth two times a day Prescriptions: HYOSCYAMINE SULFATE CR 0.375 MG XR12H-TAB (HYOSCYAMINE SULFATE) 1 by mouth two times a day  #60 x 1   Entered by:   Darcey Nora RN, CGRN   Authorized by:   Iva Boop MD, [facility]   Signed by:   Darcey Nora RN, CGRN on 04/30/2009   Method used:   Electronically to        [id number] ([facility]ail[facility]       [address]       [place], [place]  [address]       Ph: [phone] or [phone]       Fax: [phone]   RxID:   [id number]

## 2010-05-02 NOTE — Progress Notes (Signed)
Summary: med change  Phone Note Call from Patient   Caller: Patient Summary of Call: called and spoke with pt about her husband and she stated that her crestor is going to cost her $200 month and she can not afford this.  requesting for zocor 20mg --what she had been on before--to be sent to [facility] for refill--and her toprol for refills.  when pt was on zocor before she c/o leg cramps and swelling, and i brought that up to her and she stated that SN told her to use the coq10 and this has helped.  please advise if this is ok to switch her back.  pt also c/o severe sinus pressure and she has been doing the netti pot, mucinex, 2 nasal sprays but this is not helping.  offered appt today with TP but pt declined.   thanks Randell Loop CMA  October 11, 2009 9:21 AM   Follow-up for Phone Call        it is fine to change to zocor 20mg  at bedtime #30 as needed refills.  have follow up w/ Dr. Kriste Basque as shceduled w/ follow up labs  cont w/ otc meds for sinus congesiton  mucinex, saline , claritin. , if not improving ov as needed  Follow-up by: Tammy Parrett NP,  October 11, 2009 9:40 AM  Additional Follow-up for Phone Call Additional follow up Details #1::        called and spoke with pt and she is aware of TP recs for the sinus pressure--she will cont with these and if not better by tomorrow she will call back. she is aware the crestor has been changed to zocor per her request and has been sent to [facility]. Randell Loop CMA  October 11, 2009 9:49 AM     New/Updated Medications: ZOCOR 20 MG TABS (SIMVASTATIN) take one tablet by mouth at bedtime Prescriptions: TOPROL XL 25 MG  TB24 (METOPROLOL SUCCINATE) Take 1 tablet by mouth once a day  #90 x 3   Entered by:   Randell Loop CMA   Authorized by:   Michele Mcalpine MD   Signed by:   Randell Loop CMA on 10/11/2009   Method used:   Faxed to ...       [facility]* (retail)             ,          Ph: [phone]       Fax: [phone]   RxID:    [id number] ZOCOR 20 MG TABS (SIMVASTATIN) take one tablet by mouth at bedtime  #90 x 3   Entered by:   Randell Loop CMA   Authorized by:   Michele Mcalpine MD   Signed by:   Randell Loop CMA on 10/11/2009   Method used:   Faxed to ...       [facility]* (retail)             ,          Ph: [phone]       Fax: [phone]   RxID:   [id number]

## 2010-05-02 NOTE — Progress Notes (Signed)
Summary: pantoprazole refill  Phone Note From Pharmacy   Caller: [facility]* Summary of Call: received a fax from [facility] requesting a 90 day supply of pantoprazole 20mg .  I do not see where SN has ever filled this med inthe past. Please advise if ok to fill. med is on pt med lsit. Initial call taken by: Carron Curie CMA,  June 26, 2009 11:06 AM  Follow-up for Phone Call        if its on her med list then its ok to fill this since he is her primary care.  thanks Randell Loop CMA  June 26, 2009 11:19 AM   refill sent. Carron Curie CMA  June 26, 2009 11:31 AM     Prescriptions: PROTONIX 20 MG TBEC (PANTOPRAZOLE SODIUM) take 1 tab by mouth once daily - 30 min before the 1st meal of the day...  #90 x 3   Entered by:   Carron Curie CMA   Authorized by:   Michele Mcalpine MD   Signed by:   Carron Curie CMA on 06/26/2009   Method used:   Electronically to        [facility]* (mail-order)             ,          Ph: [phone]       Fax: [phone]   RxID:   [id number]

## 2010-05-02 NOTE — Letter (Signed)
Summary: [facility]   Imported By: Sherian Rein 09/17/2009 11:05:45  _____________________________________________________________________  External Attachment:    Type:   Image     Comment:   External Document

## 2010-05-02 NOTE — Progress Notes (Signed)
Summary: sinus infection  Phone Note Call from Patient Call back at [facility] Phone [phone]   Caller: Patient Call For: nadel Reason for Call: Talk to Nurse Summary of Call: Patient calling asking for zpack. She says she has drainage, cough, weak x3 weeks.  Patient has been taking mucinex which has not been helping.  [facility] Initial call taken by: Lehman Prom,  March 20, 2010 9:39 AM  Follow-up for Phone Call        Called, spoke with pt.  She c/o sinus pressure/congestion and cough - both with dark mucus, HA, PND, some wheezing x 3-4 wks.  Low grade fever yesterday.  Denies chest tightness, increased SOB.  Tried liquid mucinex and moist/warm compresses.    [facility] Allergies (verified):  1.  ! PENICILLIN 2.  ! PRILOSEC (OMEPRAZOLE) 3.  ! NEXIUM (ESOMEPRAZOLE MAGNESIUM) 4.  ! FOSAMAX (ALENDRONATE SODIUM) 5.  ! * CEFDINIR 6.  ! LEVAQUIN 7.  ! * SHELLFISH  Dr. Kriste Basque, pls advise.  Thanks! Follow-up by: Gweneth Dimitri RN,  March 20, 2010 10:06 AM  Additional Follow-up for Phone Call Additional follow up Details #1::        per SN---ok for pt to have zpak #1  take as directed with 2 refills.  this has been sent in to pts pharmacy and pt is aware  Randell Loop CMA  March 20, 2010 4:46 PM     New/Updated Medications: ZITHROMAX Z-PAK 250 MG TABS (AZITHROMYCIN) take as directed Prescriptions: ZITHROMAX Z-PAK 250 MG TABS (AZITHROMYCIN) take as directed  #1 x 2   Entered by:   Randell Loop CMA   Authorized by:   Michele Mcalpine MD   Signed by:   Randell Loop CMA on 03/20/2010   Method used:   Electronically to        [id number] (retail)       [address]       [place], [place]  [address]       Ph: [phone] or [phone]       Fax: [phone]   RxID:   [id number]

## 2010-05-02 NOTE — Progress Notes (Signed)
Summary: ins coverage   Phone Note Call from Patient Call back at Home Phone [phone]   Caller: husband, Chrissie Noa Call For: Dr. Leone Payor Reason for Call: Talk to Nurse Summary of Call: ins will no longer cover Hyoscyamine Initial call taken by: Vallarie Mare,  April 30, 2009 11:09 AM  Follow-up for Phone Call        Insurance will not pay for hyoscyamine.  Pt is willing to switch to Bentyl and receive from [facility] for $4.  Is this OK? Follow-up by: Francee Piccolo CMA Duncan Dull),  May 03, 2009 10:00 AM  Additional Follow-up for Phone Call Additional follow up Details #1::        Dicyclomine 10 mg every 6 hrs as needed for cramps #60 no refills  her problems persist neds REV before more refills etc Additional Follow-up by: Iva Boop MD, Clementeen Graham,  May 03, 2009 10:12 AM    Additional Follow-up for Phone Call Additional follow up Details #2::    patient aware Follow-up by: Darcey Nora RN, CGRN,  May 03, 2009 11:51 AM  New/Updated Medications: DICYCLOMINE HCL 10 MG  CAPS (DICYCLOMINE HCL) 1 by mouth q 6 hours as needed pain Prescriptions: DICYCLOMINE HCL 10 MG  CAPS (DICYCLOMINE HCL) 1 by mouth q 6 hours as needed pain  #60 x 0   Entered by:   Darcey Nora RN, CGRN   Authorized by:   Iva Boop MD, [facility]   Signed by:   Darcey Nora RN, CGRN on 05/03/2009   Method used:   Electronically to        [facility] [address] (retail)       [phone]       [place], [place]  [address]       Ph: [phone]       Fax: [phone]   RxID:   [id number]

## 2010-05-02 NOTE — Consult Note (Signed)
Summary: [facility]  [facility]   Imported By: Sherian Rein 06/20/2009 11:41:38  _____________________________________________________________________  External Attachment:    Type:   Image     Comment:   External Document

## 2010-05-02 NOTE — Assessment & Plan Note (Signed)
Summary: ROV//MBW   Primary Care Provider:  Alroy Dust, MD  CC:  5 month ROV & review of mult medical problems....  History of Present Illness: 77 y/o WF here for a follow up visit... she has multiple medical problems as noted below...    ~  December 14, 2008:  here for a 99yr follow up visit- doing reasonably well overall... she notes some cough & "congestion" & we discussed Mucinex Max Bid + Fluids & cough syrups as needed...  she is followed for Cards by DrWeintraub & last seen 4/10 w/ Crestor intol so he switched her to Pravachol & she is due for f/u labs... she states that she will get the Flu shot in Nov (wants to wait until the heat wave is over)...   ~  May 14, 2009:  add-on w/ cough, congestion, & yellow sinus drainage... denies fever, no c/s, no CP or SOB, etc... also c/o legs aching "from the Simvastatin 20mg "- but it is tolerable & she had worse symptoms on mult prev statins in the past... BP controlled on meds;  cardiac f/u DrWeintraub- stable;  GI eval drGessner for GERD & bloating ()Pantoprazole & Bentyl help...    Current Problem List   ALLERGIC RHINITIS (ICD-477.9) - she uses OTC antihistamines + saline nasal spray Prn... "I have alot of mucous"- Rx w/ antihist, mucinex, cough syrups...  HYPERTENSION (ICD-401.9) - controlled on TOPROL XL 25mg /d, & NORVASC 10mg /d... BP= 130/86 here today & even better at home- feeling well, tolerating meds well... denies HA, fatigue, visual changes, CP, palipit, dizziness, syncope, dyspnea, edema, etc...  ~  CXR 12/09 showed some hyperaeration w/ flattening of diaphragms, NAD...  CORONARY ARTERY DISEASE (ICD-414.00) - followed by DrWeintraub & [facility]... on the above + ASA 81mg /d, & Statin for her Chol... she denies CP, palpit, change in SOB, edema, etc...  ~  baseline EKG shows a chr LBBB...  ~  cath 10/03 w/ single vessel CAD- 90%CIRC treated w/ PTCA/ stent...  ~  Cardiolite 10/06 showed no ischemia... repeat 12/08 showed no change- no  ischemia, +diaph attenuation, EF= 64% w/ LBBB...  ~  2DEcho 12/08 showed mild conc LVH, mild calcif AoV, no stenosis, RVsys sl up at 40-50 mmHg...  ~  saw DrRW 4/10- note reviewed: repeat 2DEcho showed borderline conc LVH, norm LVF w/ EF= 55%, mild LA dil, mod MR, RVsys=42...  VENOUS INSUFFICIENCY (ICD-459.81) - she knows to elim sodium, elevate legs, wear support hose (she is not on a diuretic)... she had VV surg in 2001 by [facility]...  HYPERLIPIDEMIA (ICD-272.4) -  insurance company will not cover Crestor, only generics... she notes musc "cramps" on mult statins prev... no on SIMVASTATIN 20mg /d w/ some symptoms but tolerable...  ~  FLP 8/09 on Crestor10 = TChol 154, Tg 132, HDL 41, LDL 86 (goal is <70 for secondary prevent).  ~  FLP 9/10 on Prav20 showed TChol 211, TG 136, HDL 37, LDL 160... rec> try to incr Prav to 40mg /d> intol.  ~  FLP 2/11 on Simva20 showed TChol 168, TG 123, HDL 50, LDL 94... continue same.  HIATAL HERNIA (ICD-553.3) & GERD (ICD-530.81) - followed by DrGessner on PROTONIX 20mg /d...  ~  last EGD 1/98 showed 3-4cmHH, mod reflux esophagitis, mild duodenitis...   ~  12/10:  seen by GI w/ GERD & bloating- Rx w/ Pantoprazole & Bentyl...  DIVERTICULOSIS OF COLON (ICD-562.10) & COLONIC POLYPS (ICD-211.3) - on BENTYL 20mg  Prn.  ~  colonoscopy 6/05 by DrSam showed divertics and two 4-44mm  polyps= adenomatous...  ~  colonosco[py 5/10 by DrGessner showed divertics, hems, tiny cecal polyp= adenoma... f/u planned 21yrs.  RENAL CYST (ICD-593.2) - [facility] and last sonar 4/08 was stable...  OSTEOARTHRITIS (ICD-715.90) - eval by Susann Givens 2/09 w/ stress fracture to 4th metatarsal left foot... she takes MOBIC 7.5mg  Prn for hand arthritis & prev saw DrMyerdierks for this... ***she notes that the Mobic helps her BP!  LOW BACK PAIN SYNDROME (ICD-724.2) - she states that her prev LBP has resolved and she is active w/ exercise 3-4 days per  week... she had a partial T7 & T8 compression fractures noted in 2003... she took Forteo but only for 8 months per her GYN.  OSTEOPOROSIS (ICD-733.00) - as above>  this is managed and followed by her GYN Debbora Dus, NP) w/ BMD's and currently on calcium and VitD 2000 daily... we donot have copies of her BMD's...  HEADACHE (ICD-784.0)  ANXIETY (ICD-300.00) - on LORAZEPAM 1mg - 1/2 to 1 tab three times a day as needed.  ANEMIA (ICD-285.9) - hx of anemia in the past... Hg= 14.6 on 11/09/07...  Health Maintenance - she had Pneumovax in the past w/ a reaction that she indicates lasted for 60yr...   Allergies: 1)  ! Penicillin 2)  ! Prilosec (Omeprazole) 3)  ! Nexium (Esomeprazole Magnesium) 4)  ! Fosamax (Alendronate Sodium) 5)  ! * Cefdinir 6)  ! Levaquin 7)  ! * Shellfish  Past History:  Past Medical History:  ALLERGIC RHINITIS (ICD-477.9) HYPERTENSION (ICD-401.9) CORONARY ARTERY DISEASE (ICD-414.00) VENOUS INSUFFICIENCY (ICD-459.81) HYPERLIPIDEMIA (ICD-272.4) HIATAL HERNIA (ICD-553.3) GERD (ICD-530.81) DIVERTICULOSIS OF COLON (ICD-562.10) COLONIC POLYPS (ICD-211.3) RENAL CYST (ICD-593.2) OSTEOARTHRITIS (ICD-715.90) LOW BACK PAIN SYNDROME (ICD-724.2) OSTEOPOROSIS (ICD-733.00) HEADACHE (ICD-784.0) ANXIETY (ICD-300.00) ANEMIA (ICD-285.9)  Past Surgical History: S/P hysterectomy Varicous veins Bladder Repair  Family History: Reviewed history from 02/28/2009 and no changes required. Family History of Heart Disease: Father Mother died at 2 of Natural causes  Social History: Reviewed history from 02/28/2009 and no changes required. Occupation: Retired Patient has never smoked.  Alcohol Use - yes Illicit Drug Use - no  Review of Systems       The patient complains of prolonged cough.  The patient denies anorexia, fever, weight loss, weight gain, vision loss, decreased hearing, hoarseness, chest pain, syncope, dyspnea on exertion, peripheral edema, headaches,  hemoptysis, abdominal pain, melena, hematochezia, severe indigestion/heartburn, hematuria, incontinence, muscle weakness, suspicious skin lesions, transient blindness, difficulty walking, depression, unusual weight change, abnormal bleeding, enlarged lymph nodes, and angioedema.         C/o cough, congestion, drainage... occas leg cramps that she thinks are related to Simva20 but "not too bad"...  Vital Signs:  Patient profile:   77 year old female Height:      65 inches Weight:      167.25 pounds O2 Sat:      96 % on Room air Temp:     97.0 degrees F oral Pulse rate:   78 / minute BP sitting:   130 / 86  (right arm) Cuff size:   regular  Vitals Entered By: Randell Loop CMA (May 14, 2009 9:46 AM)  O2 Sat at Rest %:  96 O2 Flow:  Room air CC: 5 month ROV & review of mult medical problems... Is Patient Diabetic? No Pain Assessment Patient in pain? no      Comments MEDS UPDATED TODAY   Physical Exam  Additional Exam:  WD, WN, 77 y/o WF  in NAD... GENERAL:  Alert & oriented; pleasant & cooperative... HEENT:  [place]amp[place]/AT, EOM-wnl, PERRLA, EACs-clear, TMs-wnl, NOSE-sl drainage, THROAT-clear & wnl. NECK:  Supple w/ fairROM; no JVD; normal carotid impulses w/o bruits; no thyromegaly or nodules palpated; no lymphadenopathy. CHEST:  Clear to P & A; without wheezes/ rales/ or rhonchi heard... HEART:  Regular Rhythm; gr 1/6 SEM at LSB, no rubs, no gallops apprec... ABDOMEN:  Soft & nontender; normal bowel sounds; no organomegaly or masses detected. EXT: without deformities, mild arthritic changes; hx varicose veins/ +venous insuffic/ no edema. no muscle tenderness etc... NEURO:  CN's intact; no focal neuro deficits... DERM:  No lesions noted; no rash etc...    MISC. Report  Procedure date:  05/14/2009  Findings:      Lipid Panel (LIPID)   Cholesterol               168 mg/dL                   9-562   Triglycerides             123.0 mg/dL                 1.3-086.5   HDL                        49.60 mg/dL                 >78.46   LDL Cholesterol           94 mg/dL                    9-62  BMP (METABOL)   Sodium                    144 mEq/L                   135-145   Potassium                 4.2 mEq/L                   3.5-5.1   Chloride                  107 mEq/L                   96-112   Carbon Dioxide            31 mEq/L                    19-32   Glucose              [H]  102 mg/dL                   95-28   BUN                       13 mg/dL                    4-13   Creatinine                0.8 mg/dL                   2.4-4.0   Calcium                   9.2 mg/dL  8.4-10.5   GFR                       73.87 mL/min                >60  Hepatic/Liver Function Panel (HEPATIC)   Total Bilirubin           0.6 mg/dL                   4.1-6.6   Direct Bilirubin          0.2 mg/dL                   0.6-3.0   Alkaline Phosphatase      116 U/L                     39-117   AST                       24 U/L                      0-37   ALT                       21 U/L                      0-35   Total Protein             7.0 g/dL                    1.6-0.1   Albumin                   4.1 g/dL                    0.9-3.2   Impression & Recommendations:  Problem # 1:  UPPER RESPIRATORY INFECTION (ICD-465.9) We discussed her URI & rec> rest, fluids, OTC antihist, Mucinex, etc... Her updated medication list for this problem includes:    Mucinex Maximum Strength 1200 Mg Xr12h-tab (Guaifenesin) .Marland Kitchen... Take 1 tab by mouth two times a day w/ plenty of fluids...    Aspirin 81 Mg Tabs (Aspirin) .Marland Kitchen... Take one by mouth once daily    Meloxicam 7.5 Mg Tabs (Meloxicam) .Marland Kitchen... Take one by mouth once daily as needed pain in joints  Problem # 2:  HYPERTENSION (ICD-401.9) Controlled on meds-  continue same... Her updated medication list for this problem includes:    Toprol Xl 25 Mg Tb24 (Metoprolol succinate) .Marland Kitchen... Take 1 tablet by mouth once a day    Norvasc 10 Mg  Tabs (Amlodipine besylate) .Marland Kitchen... Take 1 tablet by mouth once a day  Problem # 3:  CORONARY ARTERY DISEASE (ICD-414.00) Followed by DrRW-  stable, no angina... Her updated medication list for this problem includes:    Aspirin 81 Mg Tabs (Aspirin) .Marland Kitchen... Take one by mouth once daily    Toprol Xl 25 Mg Tb24 (Metoprolol succinate) .Marland Kitchen... Take 1 tablet by mouth once a day    Norvasc 10 Mg Tabs (Amlodipine besylate) .Marland Kitchen... Take 1 tablet by mouth once a day  Problem # 4:  HYPERLIPIDEMIA (ICD-272.4) Improved on the Simva20-  pt strongly encouraged to continue this Rx... she does not want to incr the dose due to leg cramps... Her updated medication list for this problem includes:    Simvastatin  20 Mg Tabs (Simvastatin) .Marland Kitchen... 1 by mouth at bedtime  Orders: TLB-Lipid Panel (80061-LIPID) TLB-BMP (Basic Metabolic Panel-BMET) (80048-METABOL) TLB-Hepatic/Liver Function Pnl (80076-HEPATIC)  Problem # 5:  GERD (ICD-530.81) GI is stable per [employer] [facility]... The following medications were removed from the medication list:    Hyoscyamine Sulfate Cr 0.375 Mg Xr12h-tab (Hyoscyamine sulfate) .Marland Kitchen... 1 by mouth two times a day Her updated medication list for this problem includes:    Protonix 20 Mg Tbec (Pantoprazole sodium) .Marland Kitchen... Take 1 tab by mouth once daily - 30 min before the 1st meal of the day...    Dicyclomine Hcl 10 Mg Caps (Dicyclomine hcl) .Marland Kitchen... 1 by mouth q 6 hours as needed pain  Problem # 6:  OSTEOARTHRITIS (ICD-715.90) Stable w/ the NSAID, exercise program, heat , etc... Her updated medication list for this problem includes:    Aspirin 81 Mg Tabs (Aspirin) .Marland Kitchen... Take one by mouth once daily    Meloxicam 7.5 Mg Tabs (Meloxicam) .Marland Kitchen... Take one by mouth once daily as needed pain in joints  Problem # 7:  OTHER MEDICAL PROBLEMS AS NOTED>>>  Complete Medication List: 1)  Mucinex Maximum Strength 1200 Mg Xr12h-tab (Guaifenesin) .... Take 1 tab by mouth two times a day w/ plenty of fluids.Marland KitchenMarland Kitchen 2)   Aspirin 81 Mg Tabs (Aspirin) .... Take one by mouth once daily 3)  Toprol Xl 25 Mg Tb24 (Metoprolol succinate) .... Take 1 tablet by mouth once a day 4)  Norvasc 10 Mg Tabs (Amlodipine besylate) .... Take 1 tablet by mouth once a day 5)  Simvastatin 20 Mg Tabs (Simvastatin) .Marland Kitchen.. 1 by mouth at bedtime 6)  Protonix 20 Mg Tbec (Pantoprazole sodium) .... Take 1 tab by mouth once daily - 30 min before the 1st meal of the day... 7)  Dicyclomine Hcl 10 Mg Caps (Dicyclomine hcl) .Marland Kitchen.. 1 by mouth q 6 hours as needed pain 8)  Caltrate 600+d 600-400 Mg-unit Tabs (Calcium carbonate-vitamin d) .... One tablet twice daily 9)  Vitamin D3 2000 Unit Caps (Cholecalciferol) .... Take one by mouth once daily 10)  Meloxicam 7.5 Mg Tabs (Meloxicam) .... Take one by mouth once daily as needed pain in joints 11)  Lorazepam 1 Mg Tabs (Lorazepam) .... Take 1 tab by mouth at bedtime as needed for sleep...  Patient Instructions: 1)  Today we updated your med list- see below.... 2)  Try adding CoQ10- 100mg  daily to help w/ the Simvastatin tolerability.Marland KitchenMarland Kitchen 3)  Today we did your follow up fasting blood work... please call the "phone tree" in a few days for your lab results.Marland KitchenMarland Kitchen 4)  Call for any questions.Marland KitchenMarland Kitchen 5)  Please schedule a follow-up appointment in 6 months, sooner as needed.

## 2010-05-13 ENCOUNTER — Telehealth: Payer: Self-pay | Admitting: Pulmonary Disease

## 2010-05-22 ENCOUNTER — Ambulatory Visit: Payer: Self-pay | Admitting: Pulmonary Disease

## 2010-05-22 NOTE — Progress Notes (Signed)
Summary: appt tpday with dr Kriste Basque  Phone Note Call from Krystal [name]   Caller: Krystal [name] Call For: Kriste Basque Summary of Call: Krystal [name] phoned stated that she is having sinus problems and wants to see dr Kriste Basque today. I offered her an appt with Tammy but she states that she has other issues that she wants to discuss with Dr. Kriste Basque  Krystal [name] can be reached at [phone]. Initial call taken by: Vedia Coffer,  May 13, 2010 8:54 AM  Follow-up for Phone Call        Pt c/o "head cold", T-101 last night but T-97.0 when checked while on the phone, productive cough with thick large amounts of yellow mucus worse in the AM and at night not as bad during the day, sinus pressure, headaches, sweats. Denies n/v/d, chills. Has been using Neti Pot, Mucinex Max two times a day last dose yesterday. Can only take Zyrtec x 3 to 4 days before her joints start hurting. Zpak has not worked well in the past. Pt is okay with SN calling in medication if no appointments available. Please advise. Zackery Barefoot CMA  May 13, 2010 10:52 AM   Allergies:  1)  ! Penicillin 2)  ! Prilosec (Omeprazole) 3)  ! Nexium (Esomeprazole Magnesium) 4)  ! Fosamax (Alendronate Sodium) 5)  ! * Cefdinir 6)  ! Levaquin 7)  ! * Shellfish  Additional Follow-up for Phone Call Additional follow up Details #1::        per SN---she will need appt to discuss other issues with SN---ok for her to have doxy 100mg   #20  1 by mouth two times a day  and pred dosepak 5 mg    6 day pack take as directed.  thanks Randell Loop CMA  May 13, 2010 11:28 AM    pt aware of recs and rx sent. Pt set to see SN 05-22-10 at 4pm. Carron Curie CMA  May 13, 2010 11:57 AM     New/Updated Medications: DOXYCYCLINE HYCLATE 100 MG TABS (DOXYCYCLINE HYCLATE) Take 1 tablet by mouth two times a day PREDNISONE 5 MG TABS (PREDNISONE) 6 day pack as directed Prescriptions: PREDNISONE 5 MG TABS (PREDNISONE) 6 day pack as directed  #1 pak x 0   Entered by:    Carron Curie CMA   Authorized by:   Michele Mcalpine MD   Signed by:   Carron Curie CMA on 05/13/2010   Method used:   Electronically to        [facility]       [address]       [place], [place]  [address]       Ph: [phone] or [phone]       Fax: [phone]   RxID:   [id number] DOXYCYCLINE HYCLATE 100 MG TABS (DOXYCYCLINE HYCLATE) Take 1 tablet by mouth two times a day  #20 x 0   Entered by:   Carron Curie CMA   Authorized by:   Michele Mcalpine MD   Signed by:   Carron Curie CMA on 05/13/2010   Method used:   Electronically to        [facility] ([facility]ail)       [address]       [place], [place]  [address]       Ph: [phone] or [phone]       Fax: [phone]   RxID:   [id number]

## 2010-05-23 ENCOUNTER — Other Ambulatory Visit: Payer: Self-pay | Admitting: Obstetrics and Gynecology

## 2010-05-23 DIAGNOSIS — Z1231 Encounter for screening mammogram for malignant neoplasm of breast: Secondary | ICD-10-CM

## 2010-05-24 ENCOUNTER — Ambulatory Visit: Payer: Self-pay | Admitting: Pulmonary Disease

## 2010-06-13 LAB — POCT I-STAT, CHEM 8
Chloride: 105 mEq/L (ref 96–112)
Glucose, Bld: 124 mg/dL — ABNORMAL HIGH (ref 70–99)
Potassium: 3.5 mEq/L (ref 3.5–5.1)

## 2010-06-25 ENCOUNTER — Encounter: Payer: Self-pay | Admitting: Internal Medicine

## 2010-06-25 ENCOUNTER — Ambulatory Visit (INDEPENDENT_AMBULATORY_CARE_PROVIDER_SITE_OTHER): Payer: Medicare Other | Admitting: Internal Medicine

## 2010-06-25 VITALS — BP 146/74 | HR 70 | Ht 66.0 in | Wt 162.0 lb

## 2010-06-25 DIAGNOSIS — R1319 Other dysphagia: Secondary | ICD-10-CM

## 2010-06-25 DIAGNOSIS — K219 Gastro-esophageal reflux disease without esophagitis: Secondary | ICD-10-CM

## 2010-06-25 MED ORDER — LANSOPRAZOLE 30 MG PO TBDP: 30.0000 mg | ORAL_TABLET | Freq: Every day | ORAL | Status: DC

## 2010-06-25 NOTE — Assessment & Plan Note (Signed)
Her symptoms do not appear controlled at this time. There is associated dysphagia. The dysphagia is somewhat vague. Given her symptoms and problems I think it is prudent to pursue an upper GI endoscopy and possible esophageal dilation. Will increase her lansoprazole 30 mg daily on a regular basis. Risks benefits and indications of the upper endoscopy are explained to the patient, including the risk of perforation and the risks of esophageal dilation.   Note some of her symptoms are atypical and could be related to allergies and cells and not necessarily from reflux I.e. The postnasal drip and production of phlegm that she has described to me. Would not go to twice a day PPI at this point that could be a consideration though I think therapy there were more directed at her allergies and rhinitis problems might be more beneficial.

## 2010-06-25 NOTE — Patient Instructions (Addendum)
Your lansoprazole (Prevacid was changed to 30 mg daily. Tale two 15 mg tablets until the 30 mg arrives. See you at your endoscopy.  Endoscopy was scheduled for 07/03/10 at 2:00 pm.  Arrive on 4th floor at 1:00 pm

## 2010-06-25 NOTE — Progress Notes (Signed)
  Subjective:    Patient ID: Krystal [name], female    DOB: 1929/12/06, 77 y.o.   MRN: [id number]  HPI Comments:  77 year old white woman with a history of GERD. She is in today complaining of intermittent dysphagia where she can feel like food is lodged in her suprasternal area. That's not a strong pressure or pain, she feels like if she pushed there in the suprasternal notch it would regurgitate. This is with solids only. She has had this problem for a few months it seems like. There is associated regurgitation of fluid but no classic pyrosis or typical heartburn. She is on lansoprazole 15 mg daily. Medication list indicates 2 tablets daily she is only taking it 1 tablet daily. There's a lot of sinus drainage and postnasal drip though her allergies are flaring and she is taking Zyrtec about 3 times a week, she says she cannot tolerate more than that. She does not believe she's ever had an upper endoscopy, I cannot find one in her records search. CT scan in 2005 did not describe a hiatal hernia though she thinks she may have one. She has not lost weight. There's been no bleeding reported.     Review of Systems  HENT: Positive for congestion and postnasal drip.   Respiratory: Negative for chest tightness and shortness of breath.   Cardiovascular: Negative for chest pain.       Objective:   Physical Exam  Constitutional: She is oriented to person, place, and time. She appears well-developed and well-nourished.  Neck: Neck supple. No thyromegaly present.  Cardiovascular: Normal rate and normal heart sounds.   No murmur heard. Pulmonary/Chest: Effort normal and breath sounds normal. She has no wheezes.  Abdominal: Soft. Bowel sounds are normal. She exhibits no distension and no mass. There is no tenderness.  Lymphadenopathy:    She has no cervical adenopathy.  Neurological: She is alert and oriented to person, place, and time.  Psychiatric: She has a normal mood and affect.           Assessment & Plan:

## 2010-06-26 ENCOUNTER — Telehealth: Payer: Self-pay | Admitting: Internal Medicine

## 2010-06-26 NOTE — Telephone Encounter (Signed)
Patient wanted Lansoprazole 30 mg called into [facility] because Prevacid was to expensive.  I called in RX to [facility] with [facility] for Lansoprazole 30 90 with 4 refills. Called patient and left a message that RX was done and she can call back if I can do anything else or if she does not receive her RX.

## 2010-06-28 ENCOUNTER — Other Ambulatory Visit: Payer: Self-pay | Admitting: Obstetrics and Gynecology

## 2010-06-28 DIAGNOSIS — Z1231 Encounter for screening mammogram for malignant neoplasm of breast: Secondary | ICD-10-CM

## 2010-07-01 ENCOUNTER — Ambulatory Visit
Admission: RE | Admit: 2010-07-01 | Discharge: 2010-07-01 | Disposition: A | Discharge status: Home / Self Care | Payer: Medicare Other | Source: Ambulatory Visit | Attending: Obstetrics and Gynecology | Admitting: Obstetrics and Gynecology

## 2010-07-01 DIAGNOSIS — Z1231 Encounter for screening mammogram for malignant neoplasm of breast: Secondary | ICD-10-CM

## 2010-07-03 ENCOUNTER — Other Ambulatory Visit: Payer: Self-pay | Admitting: Obstetrics and Gynecology

## 2010-07-03 ENCOUNTER — Ambulatory Visit (AMBULATORY_SURGERY_CENTER): Payer: Medicare Other | Admitting: Internal Medicine

## 2010-07-03 ENCOUNTER — Encounter: Payer: Self-pay | Admitting: Internal Medicine

## 2010-07-03 VITALS — BP 144/95 | HR 73 | Temp 98.6°F | Resp 21 | Ht 66.0 in | Wt 160.0 lb

## 2010-07-03 DIAGNOSIS — K449 Diaphragmatic hernia without obstruction or gangrene: Secondary | ICD-10-CM

## 2010-07-03 DIAGNOSIS — R928 Other abnormal and inconclusive findings on diagnostic imaging of breast: Secondary | ICD-10-CM

## 2010-07-03 DIAGNOSIS — K219 Gastro-esophageal reflux disease without esophagitis: Secondary | ICD-10-CM

## 2010-07-03 DIAGNOSIS — R1310 Dysphagia, unspecified: Secondary | ICD-10-CM

## 2010-07-03 HISTORY — PX: UPPER GASTROINTESTINAL ENDOSCOPY: SHX188

## 2010-07-03 MED ORDER — SODIUM CHLORIDE 0.9 % IV SOLN: 500.0000 mL | INTRAVENOUS | Status: DC

## 2010-07-03 NOTE — Patient Instructions (Signed)
Please make an appointment to see Dr. Kriste Basque and discuss your sinus drainage problems. Stay on the higher dose of Prevacid (lansoprazole) as prescribed. If you still have swallowing problems after 2 months and seeing Dr. Kriste Basque come back to Dr. Leone Payor. The esophagus was not dilated today as there was no narrow area seen and you do not have clearcut symptoms of food stopping in esophagus.

## 2010-07-04 ENCOUNTER — Telehealth: Payer: Self-pay | Admitting: *Deleted

## 2010-07-04 NOTE — Telephone Encounter (Signed)
Follow up Call- Patient questions:  Do you have a fever, pain , or abdominal swelling? no Pain Score  0 *  Have you tolerated food without any problems? yes  Have you been able to return to your normal activities? yes  Do you have any questions about your discharge instructions: Diet   no Medications  no Follow up visit  no  Do you have questions or concerns about your Care? yes  Actions: * If pain score is 4 or above: No action needed, pain <4.  Pt had concerns about Dr. Leone Payor prescribing her 30 mg lansoprazole and her legs aching. She said that Dr. Leone Payor encouraged her to follow up with Dr. Kriste Basque and increase her meds reiterated that she should follow Dr. Marvell Fuller instructions.

## 2010-07-05 ENCOUNTER — Ambulatory Visit
Admission: RE | Admit: 2010-07-05 | Discharge: 2010-07-05 | Disposition: A | Discharge status: Home / Self Care | Payer: Medicare Other | Source: Ambulatory Visit | Attending: Obstetrics and Gynecology | Admitting: Obstetrics and Gynecology

## 2010-07-05 DIAGNOSIS — R928 Other abnormal and inconclusive findings on diagnostic imaging of breast: Secondary | ICD-10-CM

## 2010-08-07 ENCOUNTER — Other Ambulatory Visit: Payer: Self-pay | Admitting: Obstetrics and Gynecology

## 2010-08-13 NOTE — Assessment & Plan Note (Signed)
[facility]                         GASTROENTEROLOGY OFFICE NOTE   ARVADA, SEABORN                        MRN:          [id number]  DATE:09/15/2006                            DOB:          1929-04-14    CHIEF COMPLAINT:  Followup of reflux, medication problems.   Ms. Ayler has been on Prevacid 15 mg a day for a number of years with  good control of her heartburn and acid reflux. She was pushed to try  omeprazole, which is a generic, recently. That made her ankles swell and  her feet hurt. Looking back, she has had the same problems with Nexium  and Prilosec. She went back to her Prevacid and she is okay. She would  like refills. She has previously been a patient of Dr.  Blossom Hoops.   MEDICATIONS:  Listed and reviewed in the chart.   PAST MEDICAL HISTORY/PROBLEMS:  1. Adenomatous colon polyps, three were removed in June 2005.  2. She also had diverticulosis.  3. Gastroesophageal reflux disease with hiatal hernia and gastritis.      Last EGD I see is from 1998.  4. Dyslipidemia.  5. Hypertension.  6. Asthmatic bronchitis.  7. Coronary artery disease with percutaneous transluminal coronary      angioplasty with stent.  8. Osteoarthritis.  9. ALLERGY TO OMEPRAZOLE AND ESOMEPRAZOLE.  10.ALLERGY TO PENICILLIN.  11.Presumed esophagitis due to Fosamax in the past.   REVIEW OF SYSTEMS:  As above. She has really done well otherwise.   PHYSICAL EXAMINATION:  Weight is 163 pounds. Pulse 80. Blood pressure  132/72.  ANKLES: No pedal edema. No discoloration of the skin, nontender.   ASSESSMENT:  1. Gastroesophageal reflux disease, well-controlled on Prevacid 15 mg      per day. This is a Tier 2 medicine for her.  2. ALLERGIES/INTOLERANCE TO OMEPRAZOLE AND ESOMEPRAZOLE.  3. She believes she has failed Protonix and other PPIs in the past.  4. History of adenomatous colon polyps. Per Dr.  Blossom Hoops plan, is      due for surveillance colonoscopy next  year.   PLAN:  1. Continue on her Prevacid. I refilled this. Hopefully, alerting the      pharmaceutical benefits      Drew Herman that she is ALLERGIC AND INTOLERANT OF OMEPRAZOLE will      take care of      the problem.  2. Plan for colonoscopy with me in June of 2009.     Iva Boop, MD,FACG  Electronically Signed    CEG/MedQ  DD: 09/15/2006  DT: 09/15/2006  Job #: [id number]

## 2010-08-16 NOTE — Cardiovascular Report (Signed)
Krystal [name], Krystal [name]                           ACCOUNT NO.:  [id number]   MEDICAL RECORD NO.:  [id number]                   PATIENT TYPE:  OIB   LOCATION:  2899                                 FACILITY:  [facility]   PHYSICIAN:  Charlies Constable, MD [facility]                DATE OF BIRTH:  04-Jun-1929   DATE OF PROCEDURE:  01/12/2002  DATE OF DISCHARGE:                              CARDIAC CATHETERIZATION   CLINICAL HISTORY:  The patient is 77 years old and has had exertional angina  for some time.  She has had a stress Cardiolite scan which showed no  definite ischemia.  Because of persistent symptoms, she was seen in  consultation by Dr. Tenny Craw who felt that she should be evaluated with coronary  angiography.   DESCRIPTION OF PROCEDURE:  The procedure was performed via the right femoral  artery using an arterial sheath and #6 [place] preformed coronary catheters.  A front wall arterial puncture was performed and Omnipaque contrast was  used.  At the completion of the diagnostic study, we made a decision to  proceed with intervention of the circumflex artery.   The patient was consented and enrolled in the JUMBO trial.  As part of this  trial, she was to receive either Plavix or a JUMBO study drug at the time of  intervention by mouth.  We also gave her weight-adjusted heparin to prolong  an ACT of greater than 200 seconds and we gave her double bolus Integrilin  and infusion.  We used a #6 [place] guiding catheter with sideholes and  a short luge wire.  We were able to cross the lesion in the proximal  circumflex artery with the wire without difficulty.  We direct-stented with  a 3.0 x 13-mm CYPHER stent deploying this with one inflation of 16  atmospheres for 35 seconds.  Repeat diagnostic studies were then performed  through the guiding catheter.  The patient tolerated the procedure well and  left the laboratory in satisfactory condition.   RESULTS:  1. Left main coronary artery:  The left  main coronary artery was free of     significant disease.  2. Left anterior descending artery:  The left anterior descending artery     gave rise to two diagonal branches and three septal perforators.  These     and the left anterior descending artery proper were free of significant     disease.  3. Left circumflex artery:  The left circumflex artery gave rise to a large     marginal branch and a small AV branch.  There was 90% stenosis right at     the bifurcation of the large marginal branch and small AV branch.  4. Right coronary artery:  The right coronary artery was a large vessel that     gave rise to a right ventricular branch, a posterior descending branch,  and two posterolateral branches.  These vessels were free of significant     disease.  5. Left ventriculogram:  The left ventriculogram performed in the RAO     projection showed overall good wall motion.  There was some mid inferior     wall hypokinesis.  The estimated ejection fraction was 55%.  The left     ventriculogram performed in the LAO projection showed slight hypokinesis     of the inferolateral wall.   Following stenting of the lesion in the proximal circumflex artery, the  stenosis improved from 90% to less than 10%.  We stented across the AV  branch which is a small vessel and did not compromise the flow although it  was pinched.   CONCLUSION:  1. Coronary artery disease with 90% stenosis in the proximal circumflex     artery, no major obstruction in the left anterior descending and right     coronary arteries, and inferior wall hypokinesis.  2.     Successful stenting of the proximal circumflex stenosis with improvement in      percent area narrowing from 90% to less than 10% using a CYPHER stent.   DISPOSITION:  The patient was transferred to the post angioplasty unit for  further observation.                                               Charlies Constable, MD [facility]    BB/MEDQ  D:  01/12/2002  T:   01/12/2002  Job:  [id number]   cc:   Lonzo Cloud. Kriste Basque, M.D. [facility]   Pricilla Riffle, M.D. [facility]   Cardiac Catheterization Lab

## 2010-08-16 NOTE — Discharge Summary (Signed)
NAME:  Krystal [name], DUN                           ACCOUNT NO.:  [id number]   MEDICAL RECORD NO.:  [id number]                   PATIENT TYPE:  OIB   LOCATION:  6529                                 FACILITY:  [facility]   PHYSICIAN:  Pricilla Riffle, M.D. [facility]             DATE OF BIRTH:  06-30-1929   DATE OF ADMISSION:  01/12/2002  DATE OF DISCHARGE:  01/13/2002                           DISCHARGE SUMMARY - REFERRING   PROCEDURES:  Coronary angiogram/stent CFX, January 12, 2002.   REASON FOR ADMISSION:  Please refer to dictated admission note.   LABORATORY DATA:  WBC 10.9, Hgb 10, HCT 31.4, platelets 197 at discharge.  Sodium 136, potassium 3.1, glucose 107, BUN 15, creatinine 0.8 at discharge.  CPK/MB negative x 3.   Admission CXR:  No active disease.   HOSPITAL COURSE:  The patient was admitted for elective coronary  angiography, performed January 12, 2002, by Dr. Charlies Constable (see operative  report for full details), following presentation to the office for  progressive exertional angina.  The patient has several cardiac risk  factors, notable for dyslipidemia and hypertension, and had had two previous  negative pharmacologic stress tests.   Coronary angiogram revealed significant single-vessel coronary artery  disease with normal left ventricular function.  Specifically, there was a  90% proximal CFX lesion which was reduced to less than 10% residual stenosis  after treatment with a CYPHER stent by Dr. Juanda Chance.  Of note, patient was  enrolled in the JUMBO study and will need to be on the study drug for 30  days.  Afterwards, she will need to return to the office for placement on  open-label Plavix for an additional two months.   The patient was cleared for discharge the following morning in  hemodynamically stable condition.  On physical examination, there was  evidence of ecchymosis around the incision site but no hematoma, oozing,  with intact distal pulses.   Of note, the patient  did have hypokalemia (3.1) at time of discharge.  She  was repleted with 40 mEq prior to discharge.   MEDICATION ADJUSTMENTS:  Reinitiation of a statin with plans to start Zocor  20 q.d.  The patient has reported prior history of Zocor and Zetia  intolerance.   MEDICATIONS AT DISCHARGE:  1. JUMBO study drug (x 30 days).  2. Coated aspirin 325 mg q.d.  3. Zocor 20 mg q.d.  4. Toprol XL 25 mg q.d.  5. Altace 2.5 mg b.i.d.  6. Nitrostat 0.4 mg as directed.   DISCHARGE INSTRUCTIONS:  No heavy lifting, driving, or strenuous activity x  2 days; low fat/cholesterol diet; patient is to call the office if there is  any bleeding/swelling of the groin.   FOLLOW UP:  The patient is scheduled to follow up with the [facility] on Friday, January 28, 2002, at 10:30 a.m.   DISCHARGE DIAGNOSES:  1. Single-vessel coronary  artery disease.     a. Status post stent (CYPHER) 90% proximal circumflex, January 12, 2002.     b. Normal left ventricular function.     c. Previous negative pharmacologic Cardiolites.  2. Dyslipidemia.     a. History of Zocor and Zetia intolerance.  3. Hypertension.  4. Hypokalemia.  5. Hiatal hernia/gastroesophageal reflux disease.  6. Chronic LVDD.     Gene Serpe, P.A. [facility]                      Pricilla Riffle, M.D. [facility]    GS/MEDQ  D:  01/13/2002  T:  01/13/2002  Job:  [id number]

## 2010-08-16 NOTE — Op Note (Signed)
[facility]  Patient:    TIANI, Krystal [name]                       MRN: [id number] Proc. Date: 12/16/99 Attending:  Zigmund Daniel, M.D.                           Operative Report  PREOPERATIVE DIAGNOSES:  Varicose veins of the left leg.  POSTOPERATIVE DIAGNOSES:  Varicose veins of the left leg.  OPERATION PERFORMED:  Ligation and division of the left long saphenous vein with removal of varicose vein clusters.  SURGEON:  Dr. Orson Slick.  ANESTHESIA:  General.  DESCRIPTION OF PROCEDURE:  Prior to going into the operating room with the patient erect, I marked all the visible palpable varicosities. After monitoring and adequate general anesthesia and routine preparation and draping of the left lower extremity, I made an incision over the highest varicosity which I felt would be associated with the long saphenous vein. I encountered the long saphenous vein and pulled it down about 3 cm and ligated it cephalad. I then dissected out the branch varicosities after dividing them from the long saphenous vein and ligating the long saphenous vein caudad toward the foot. I gained hemostasis after avulsion of varicose clusters using hemostasis. I cut down on and similarly treated the long saphenous vein in the proximal medial leg. There were other varicose clusters which I similarly removed and got hemostasis with pressure. After getting good hemostasis, I closed all incisions with intracuticular 4-0 Vicryl and Steri-Strips. I wrapped the leg with a bulky compressive bandage. Sponge, needle and instrument counts were correct. She tolerated the operation well. DD:  12/16/99 TD:  12/17/99 Job: [id number]

## 2010-08-23 ENCOUNTER — Telehealth: Payer: Self-pay | Admitting: Pulmonary Disease

## 2010-08-23 MED ORDER — AMLODIPINE BESYLATE 10 MG PO TABS: 10.0000 mg | ORAL_TABLET | Freq: Every day | ORAL | Status: DC

## 2010-08-23 MED ORDER — METOPROLOL SUCCINATE ER 25 MG PO TB24: 25.0000 mg | ORAL_TABLET | Freq: Every day | ORAL | Status: DC

## 2010-08-23 MED ORDER — SIMVASTATIN 20 MG PO TABS: 20.0000 mg | ORAL_TABLET | Freq: Every day | ORAL | Status: DC

## 2010-08-23 NOTE — Telephone Encounter (Signed)
Called pt to make her aware of meds sent in to [facility].  Pt is aware that she will need to follow up with SN in November.

## 2010-10-14 ENCOUNTER — Telehealth: Payer: Self-pay | Admitting: Pulmonary Disease

## 2010-10-14 NOTE — Telephone Encounter (Signed)
Pt aware of recs. Jennifer Castillo, CMA  

## 2010-10-14 NOTE — Telephone Encounter (Signed)
Recommend check b/p prior to walking at rest for at least  Keep log with bp check 3  xweek If >150/90 will need ov.  If symptoms remain will need ov.  Make sure she is drinking enough fluids  Please contact office for sooner follow up if symptoms do not improve or worsen or seek emergency care

## 2010-10-14 NOTE — Telephone Encounter (Signed)
lmtcb

## 2010-10-14 NOTE — Telephone Encounter (Signed)
Spoke with the pt and she states over the last 2 weeks her BP has been running between 160/80 and 140/75. She states she has been having some dizziness, slight headache, and fatigue. She states she checks her BP in the mornings after her morning walk.  I verified pt meds and she is taking  norvasc 10mg  daily and toprol 25mg  daily. I offered pt appt with TP, but pt wants to see only Dr. Kriste Basque.  Please advise. Carron Curie, CMA

## 2010-10-18 ENCOUNTER — Telehealth: Payer: Self-pay | Admitting: Pulmonary Disease

## 2010-10-18 MED ORDER — BUPROPION HCL ER (SR) 100 MG PO TB12: 100.0000 mg | ORAL_TABLET | Freq: Every day | ORAL | Status: DC

## 2010-10-18 MED ORDER — LOSARTAN POTASSIUM 50 MG PO TABS: 50.0000 mg | ORAL_TABLET | Freq: Every day | ORAL | Status: DC

## 2010-10-18 NOTE — Telephone Encounter (Signed)
Per SN--ok to start on wellbutrin sr  100mg    1 daily   #30.  thanks

## 2010-10-18 NOTE — Telephone Encounter (Signed)
Per SN-----she will need ARB added---start on losartan 50mg    1 tab daily #30    rov with SN    In the 2-3 wks.  thanks

## 2010-10-18 NOTE — Telephone Encounter (Signed)
I spoke with the pt and she states that she has been checking her BP as directed by Tammy before she does her morning walk and again after 15 minutes of rest at end of walk and it is still running 150/80's. I advised that Tammy advised to keep a log for 3 weeks and then if symptoms continued then she would need an OV. The pt states she is still having a headache, dizziness and nausea and this concerns her and she does not want to wait 3 weeks and request to be seen today. She only wants to see Dr. Kriste Basque. I advised he does not have any available appts so I will send him the message to get his recs. Pt is ok with this. Pt takes Toprol 25mg  daily and Norvasc 10mg  daily.  Please advise.Carron Curie, CMA Allergies  Allergen Reactions  . Alendronate Sodium     REACTION: pt states INTOL to Fosamax w/ esophagitis  . Cefdinir     REACTION: diarrhea  . Esomeprazole Magnesium     REACTION: pt states INTOL to Nexium  . Levofloxacin     REACTION: diarrhea  . Omeprazole     REACTION: pt states INTOL to Prilosec  . Penicillins   . Shellfish-Derived Products Nausea Only  \

## 2010-10-18 NOTE — Telephone Encounter (Signed)
Called, spoke with pt.  She is aware SN is ok with her starting wellbutrin sr 100 mg qd.  Rx sent to [facility] [address] --pt aware and verbalized understanding of these instructions.

## 2010-10-18 NOTE — Telephone Encounter (Signed)
Called, spoke with pt.  She is aware SN would like to start losartan 50mg  1 tab qd for her bp and see her in 2-3 wks.  His first available is Nov 11, 2010 -- pt ok with this and will call back if anything is needed before this appt.  I did instruct pt to continue to check her blood pressure and definitely call back if her symptoms continue after starting the losartan.  She verbalized understanding of this.  Pt also states she has been under a lot of stress recently .  She has heard Wellbutrin could work to help keep stress levels down.  She would like to know if SN thinks a low dose would work for her - requesting his thoughts on this.  Pls advise.  Thanks!

## 2010-10-24 ENCOUNTER — Telehealth: Payer: Self-pay | Admitting: Pulmonary Disease

## 2010-10-24 NOTE — Telephone Encounter (Signed)
Spoke with the patient and she states her BP is improved but she is still having dizziness. She states it is worse in the mornings when she first gets up. I advised the pt to get to try meclizine OTC. Pt states she will do so. Carron Curie, CMA

## 2010-11-11 ENCOUNTER — Ambulatory Visit (INDEPENDENT_AMBULATORY_CARE_PROVIDER_SITE_OTHER): Payer: Medicare Other | Admitting: Pulmonary Disease

## 2010-11-11 ENCOUNTER — Encounter: Payer: Self-pay | Admitting: Pulmonary Disease

## 2010-11-11 DIAGNOSIS — I2510 Atherosclerotic heart disease of native coronary artery without angina pectoris: Secondary | ICD-10-CM

## 2010-11-11 DIAGNOSIS — E785 Hyperlipidemia, unspecified: Secondary | ICD-10-CM

## 2010-11-11 DIAGNOSIS — K219 Gastro-esophageal reflux disease without esophagitis: Secondary | ICD-10-CM

## 2010-11-11 DIAGNOSIS — J309 Allergic rhinitis, unspecified: Secondary | ICD-10-CM

## 2010-11-11 DIAGNOSIS — F411 Generalized anxiety disorder: Secondary | ICD-10-CM

## 2010-11-11 DIAGNOSIS — D126 Benign neoplasm of colon, unspecified: Secondary | ICD-10-CM

## 2010-11-11 DIAGNOSIS — M545 Low back pain, unspecified: Secondary | ICD-10-CM

## 2010-11-11 DIAGNOSIS — M1990 Unspecified osteoarthritis, unspecified site: Secondary | ICD-10-CM

## 2010-11-11 DIAGNOSIS — M810 Age-related osteoporosis without current pathological fracture: Secondary | ICD-10-CM

## 2010-11-11 DIAGNOSIS — K5730 Diverticulosis of large intestine without perforation or abscess without bleeding: Secondary | ICD-10-CM

## 2010-11-11 DIAGNOSIS — I10 Essential (primary) hypertension: Secondary | ICD-10-CM

## 2010-11-11 DIAGNOSIS — I872 Venous insufficiency (chronic) (peripheral): Secondary | ICD-10-CM

## 2010-11-11 DIAGNOSIS — K449 Diaphragmatic hernia without obstruction or gangrene: Secondary | ICD-10-CM

## 2010-11-11 DIAGNOSIS — R42 Dizziness and giddiness: Secondary | ICD-10-CM

## 2010-11-11 MED ORDER — RANITIDINE HCL 150 MG PO TABS: 150.0000 mg | ORAL_TABLET | Freq: Two times a day (BID) | ORAL | Status: DC

## 2010-11-11 MED ORDER — PREDNISONE (PAK) 5 MG PO TABS: ORAL_TABLET | ORAL | Status: DC

## 2010-11-11 NOTE — Patient Instructions (Signed)
Today we updated your med list in EPIC...    We wrote for a Sterapred dosepak to treat the sinus inflammation...  For your sinus, drainage, dizziness:    Take an ANTIHISTAMINE in the AM (Claritin, Zyrtek, or Allegra)...    Spray a SALINE nasal mist in your nose every 1-2H while awake...    Use the MUCINEX 1-2 tabs twice daily w/ plenty of fluids...    Use the FLONASE 2sprays in each nostril at bedtime...  We also wrote a new prescription for ZANTAC 150mg  twice daily for the stomach acid...  Continue your other meds the same...  Call for any questions...  Let's plan a recheck in 6 months.Marland KitchenMarland Kitchen

## 2010-11-11 NOTE — Progress Notes (Signed)
Subjective:    Patient ID: Krystal [name], female    DOB: 10-15-1929, 77 y.o.   MRN: [id number]  HPI 77 y/o WF here for a follow up visit... she has multiple medical problems as noted below...   ~  May 14, 2009:  add-on w/ cough, congestion, & yellow sinus drainage... denies fever, no c/s, no CP or SOB, etc... also c/o legs aching "from the Simvastatin 20mg "- but it is tolerable & she had worse symptoms on mult prev statins in the past... BP controlled on meds;  cardiac f/u DrWeintraub- stable;  GI eval DrGessner for GERD & bloating (Pantoprazole & Bentyl help)...  ~  February 05, 2010:  she's had a good yr- no new complaints or concerns... saw DrWeintraub 6/11- stable, no angina, BP controlled, no changes made...   ~  November 11, 2010:  81mo ROV     She saw DrGessner 3/12> hx GERD, c/o intermittent dysphagia, & started on Prevacid 30mg /d; EGD 4/12 showed 3cmHH, otherw neg, no stricture found.    She's been c/o inner ear, balance off, dizzy for a month & saw DrBates last week; she reports that he thought it was "coming from my stomach" & gave her Flonase Rx; he also wanted her to stop the Prevacid due to her bones and suggested OTC Zantac ("he scared me"- she wants Rx for Zantac150mg  bid).    She saw DrWeintraub last week and had severe drainage & "he took pity on me" & gave her a Depo shot, sl improved but symptoms persist    We discussed Rx w/ Antihist in AM, spray Saline Q1-2H during the day, take Mucinex 1-2 Bid w/ Fluids and use the Flonase Qhs;  we also agreed to Rx acute symptoms w/ Sterapred dosepak...          PROBLEM LIST:  ALLERGIC RHINITIS (ICD-477.9) - she uses OTC antihistamines + saline nasal spray Prn... "I have alot of mucous"- Rx w/ antihist, mucinex, cough syrups (wants Tussionex)... she had some left ear discomfort, dry skin & DrBates suggested mineral oil drop or two on occas Prn.  HYPERTENSION (ICD-401.9) - controlled on TOPROL XL 25mg /d, NORVASC 10mg /d, & LOSARTAN  50mg - 1/2 tab daily... BP= 136/78 here today & even better at home- feeling well, tolerating meds well... denies HA, fatigue, visual changes, CP, palipit, dizziness, syncope, dyspnea, edema, etc... ~  CXR 12/09 showed some hyperaeration w/ flattening of diaphragms, NAD...  CORONARY ARTERY DISEASE (ICD-414.00) - followed by DrWeintraub & [facility]... on the above + ASA 81mg /d, & Statin for her Chol... she denies CP, palpit, change in SOB, edema, etc; she exercises by walking... ~  baseline EKG shows a chr LBBB... ~  cath 10/03 w/ single vessel CAD- 90%CIRC treated w/ PTCA/ stent... ~  Cardiolite 10/06 showed no ischemia... repeat 12/08 showed no change- no ischemia, +diaph attenuation, EF= 64% w/ LBBB... ~  2DEcho 12/08 showed mild conc LVH, mild calcif AoV, no stenosis, RVsys sl up at 40-50 mmHg... ~  2DEcho 4/10 showed borderline conc LVH, norm LVF w/ EF= 55%, mild LA dil, mod MR, RVsys=42... ~  8/12:  She tells me that she saw DrRW last week & he did LABS & ECHO ==> pending.  VENOUS INSUFFICIENCY (ICD-459.81) - she knows to elim sodium, elevate legs, wear support hose (she is not on a diuretic)... she had VV surg in 2001 by [facility]...  HYPERLIPIDEMIA (ICD-272.4) -  insurance company will not cover Crestor, only generics... she notes musc "cramps" on mult statins  prev... now on SIMVASTATIN 20mg /d w/ some symptoms but tolerable... ~  FLP 8/09 on Crestor10 = TChol 154, Tg 132, HDL 41, LDL 86 (goal is <70 for secondary prevent). ~  FLP 9/10 on Prav20 showed TChol 211, TG 136, HDL 37, LDL 160... rec> try to incr Prav to 40mg /d> intol. ~  FLP 2/11 on Simva20 showed TChol 168, TG 123, HDL 50, LDL 94... continue same. ~  FLP 11/11 on Simva20 showed TChol 146, TG 120, HDL 38, LDL 84  HIATAL HERNIA (ICD-553.3) & GERD (ICD-530.81) - followed by DrGessner & rec to incr PREVACID to 30mg /d; but she says "DrBates scared me & says I shouldn't be taking the Prevacid" therefore she wants ZANTAC 150mg  Bid... ~  last  EGD 1/98 showed 3-4cmHH, mod reflux esophagitis, mild duodenitis...  ~  12/10:  seen by GI w/ GERD & bloating- Rx w/ Pantoprazole & Bentyl... ~  4/12: Hx GERD, c/o intermittent dysphagia, & EGD showed 3cmHH, otherw neg, no stricture found; rec to incr Prevacid to 30mg /d, but she wants top change to [facility] as noted above...  DIVERTICULOSIS OF COLON (ICD-562.10) & COLONIC POLYPS (ICD-211.3) - on BENTYL 20mg  Prn. ~  colonoscopy 6/05 by DrSam showed divertics and two 4-6mm polyps= adenomatous... ~  colonosco[py 5/10 by DrGessner showed divertics, hems, tiny cecal polyp= adenoma... f/u planned 33yrs.  RENAL CYST (ICD-593.2) - [facility] and last sonar 4/08 was stable...  OSTEOARTHRITIS (ICD-715.90) - eval by Susann Givens 2/09 w/ stress fracture to 4th metatarsal left foot... she takes MOBIC 7.5mg  Prn for hand arthritis & prev saw DrMyerdierks for this... she notes that the Mobic helps her BP!  LOW BACK PAIN SYNDROME (ICD-724.2) - she states that her prev LBP has resolved and she is active w/ exercise 3-4 days per week... she had a partial T7 & T8 compression fractures noted in 2003... she took Forteo but only for 8 months per her GYN.  OSTEOPOROSIS (ICD-733.00) - as above>  this is managed and followed by her GYN Debbora Dus, NP) w/ BMD's and currently on calcium and VitD 2000 daily... we donot have copies of her BMD's...  HEADACHE (ICD-784.0)  ANXIETY (ICD-300.00) - on LORAZEPAM 1mg - 1/2 to 1 tab three times a day as needed.  ANEMIA (ICD-285.9) - hx of anemia in the past... Hg= 14.6 on 11/09/07...  Health Maintenance - she had Pneumovax in the past w/ a reaction that she indicates lasted for 21yr...   Past Surgical History  Procedure Date  . Abdominal hysterectomy   . Varicose vein surgery   . Bladder repair   . Colonoscopy 07/2008    small adenoma, previous polyps also  . Cataract extraction     Outpatient Encounter Prescriptions as of  11/11/2010  Medication Sig Dispense Refill  . amLODipine (NORVASC) 10 MG tablet Take 1 tablet (10 mg total) by mouth daily.  90 tablet  3  . aspirin 81 MG tablet Take 81 mg by mouth daily.        . Calcium Carbonate-Vitamin D 600-400 MG-UNIT per tablet Take 1 tablet by mouth 2 (two) times daily.        . cetirizine (ZYRTEC) 10 MG tablet Take 10 mg by mouth daily as needed.        . Cholecalciferol (VITAMIN D3) 2000 UNITS TABS Take 2,000 Units by mouth daily.        Marland Kitchen dicyclomine (BENTYL) 10 MG capsule Take 10 mg by mouth. 1 every 6  hours daily      . LORazepam (ATIVAN) 1 MG tablet Take 1 mg by mouth at bedtime as needed.        Marland Kitchen losartan (COZAAR) 50 MG tablet Take 1/2 tablet by mouth once daily       . meloxicam (MOBIC) 7.5 MG tablet Take 7.5 mg by mouth daily. As needed       . metoprolol succinate (TOPROL XL) 25 MG 24 hr tablet Take 1 tablet (25 mg total) by mouth daily.  90 tablet  3  . ranitidine (ZANTAC) 150 MG tablet Take 150 mg by mouth 2 (two) times daily.        . simvastatin (ZOCOR) 20 MG tablet Take 1 tablet (20 mg total) by mouth at bedtime.  90 tablet  3  . buPROPion (WELLBUTRIN SR) 100 MG 12 hr tablet Take 1 tablet (100 mg total) by mouth daily.  30 tablet  0  . lansoprazole (PREVACID SOLUTAB) 30 MG disintegrating tablet Take 1 tablet (30 mg total) by mouth daily before breakfast.  ==> CHANGED TO ZANTAC per request.     Facility-Administered Encounter Medications as of 11/11/2010  Medication Dose Route Frequency Provider Last Rate Last Dose  . 0.9 %  sodium chloride infusion  500 mL Intravenous Continuous Iva Boop, MD        Allergies  Allergen Reactions  . Alendronate Sodium     REACTION: pt states INTOL to Fosamax w/ esophagitis  . Cefdinir     REACTION: diarrhea  . Esomeprazole Magnesium     REACTION: pt states INTOL to Nexium  . Levofloxacin     REACTION: diarrhea  . Omeprazole     REACTION: pt states INTOL to Prilosec  . Penicillins   . Shellfish-Derived  Products Hives, Nausea Only and Rash    Current Medications, Allergies, Past Medical History, Past Surgical History, Family History, and Social History were reviewed in [employer] record.    Review of Systems      See HPI - all other systems neg except as noted...      The patient denies anorexia, fever, weight loss, weight gain, vision loss, decreased hearing, hoarseness, chest pain, syncope, dyspnea on exertion, peripheral edema, prolonged cough, headaches, hemoptysis, abdominal pain, melena, hematochezia, severe indigestion/heartburn, hematuria, incontinence, muscle weakness, suspicious skin lesions, transient blindness, difficulty walking, depression, unusual weight change, abnormal bleeding, enlarged lymph nodes, and angioedema.     Objective:   Physical Exam    WD, WN, 77 y/o WF in NAD... GENERAL:  Alert & oriented; pleasant & cooperative... HEENT:  [place]/AT, EOM-wnl, PERRLA, EACs-clear, TMs-wnl, NOSE-sl drainage, THROAT-clear & wnl. NECK:  Supple w/ fairROM; no JVD; normal carotid impulses w/o bruits; no thyromegaly or nodules palpated; no lymphadenopathy. CHEST:  Clear to P & A; without wheezes/ rales/ or rhonchi heard... HEART:  Regular Rhythm; gr 1/6 SEM at LSB, no rubs, no gallops apprec... ABDOMEN:  Soft & nontender; normal bowel sounds; no organomegaly or masses detected. EXT: without deformities, mild arthritic changes; hx varicose veins/ +venous insuffic/ no edema. no muscle tenderness etc... NEURO:  CN's intact; no focal neuro deficits... DERM:  No lesions noted; no rash etc...   Assessment & Plan:   AR, Chronic Sinus Problems>  We reviewed regimen w/ Antihist Qam, Saline nasal mist every 1-2H during the day, Mucinex 2 tabsBid, & Flonase Qhs...  HBP>  Controlled on meds, continue same...  CAD>  Followed by DrWeintraub on meds listed; seen recently & he did  labs according to the pt; copies pending.  Ven Insuffic>  She had VV surg in 2001; she  knows to elim salt, elevate, wear support hose...  HYPERLIPID>  Stable on Simva20, recent labs per DrRW pending...  GI> HH, GERD, Divertics, Polyps>  Wants change to H2Blockers eg- ZANTAC Rx...  DJD, LBP>  Evaluated by Susann Givens in past; prev hx partial T7-T8 compression...  Osteoporosis>  Managed by her GYN; on Calcium, MVI, Vit D; w/ the above compression she took Forteo in past but only for 8months...  Anxiety/ Depression>  On Wellbutrin for depression & requests refill;  On Lorazepam for nerves as well.Marland KitchenMarland Kitchen

## 2010-11-22 ENCOUNTER — Telehealth: Payer: Self-pay | Admitting: Pulmonary Disease

## 2010-11-22 MED ORDER — BUPROPION HCL ER (SR) 100 MG PO TB12: 100.0000 mg | ORAL_TABLET | Freq: Every day | ORAL | Status: DC

## 2010-11-22 MED ORDER — AZELASTINE HCL 0.1 % NA SOLN: NASAL | Status: DC

## 2010-11-22 MED ORDER — LOSARTAN POTASSIUM 50 MG PO TABS: ORAL_TABLET | ORAL | Status: DC

## 2010-11-22 NOTE — Telephone Encounter (Signed)
I spoke with the pt and she states she needs refill on cozaar and Wellbutrin sent to [facility]. This has been done. The pt also states she is still having a lot of nasal drainage and is requesting an rx for azelastine nasal spray to see if this helps. Please advise.Carron Curie, CMA

## 2010-11-22 NOTE — Telephone Encounter (Signed)
Per Sn---ok for astelin nasal spray  1-2 sprays in each nostril bid prn. thanks

## 2010-11-22 NOTE — Telephone Encounter (Signed)
Rx sent to [facility] -- pt aware.

## 2010-11-24 ENCOUNTER — Encounter: Payer: Self-pay | Admitting: Pulmonary Disease

## 2010-12-05 ENCOUNTER — Encounter: Payer: Self-pay | Admitting: Pulmonary Disease

## 2010-12-13 ENCOUNTER — Telehealth: Payer: Self-pay | Admitting: Pulmonary Disease

## 2010-12-13 MED ORDER — PREDNISONE 20 MG PO TABS: ORAL_TABLET | ORAL | Status: DC

## 2010-12-13 MED ORDER — AZITHROMYCIN 250 MG PO TABS: ORAL_TABLET | ORAL | Status: DC

## 2010-12-13 NOTE — Telephone Encounter (Signed)
Pt is aware of SN recs and prescriptions have been sent to [facility] on [address]. Pt will call if symptoms persist for ENT referral.

## 2010-12-13 NOTE — Telephone Encounter (Signed)
Per SN---zpak #1  Take as directed, pred 20mg    #15  1 po bid x 3 days, 1 daily x 3 days, 1/2 daily until gone, saline nasal spray every 1-2 hours prn and mucinex 600mg   2 po bid .  If this does not resolve then she will need eval with ENT.  thanks

## 2010-12-13 NOTE — Telephone Encounter (Signed)
Spoke with the pt and she was seen on 11-11-10 and given pred pak for sinus and she staets while she was on the medication she felt better, but once she stopped her symptoms returned. She is c/o sinus congestion and pressure, headache, nasal drainage, PND, and felt "feverish". Symptoms have gotten a lot worse over last 2 days. She states she is taking claritin, saline nasal mist, mucinex, and flonase daily as directed. Please advise. Carron Curie, CMA Allergies  Allergen Reactions  . Alendronate Sodium     REACTION: pt states INTOL to Fosamax w/ esophagitis  . Cefdinir     REACTION: diarrhea  . Esomeprazole Magnesium     REACTION: pt states INTOL to Nexium  . Levofloxacin     REACTION: diarrhea  . Omeprazole     REACTION: pt states INTOL to Prilosec  . Penicillins   . Shellfish-Derived Products Hives, Nausea Only and Rash

## 2010-12-17 ENCOUNTER — Ambulatory Visit (INDEPENDENT_AMBULATORY_CARE_PROVIDER_SITE_OTHER): Payer: Medicare Other | Admitting: Adult Health

## 2010-12-17 ENCOUNTER — Telehealth: Payer: Self-pay | Admitting: Pulmonary Disease

## 2010-12-17 ENCOUNTER — Encounter: Payer: Self-pay | Admitting: Adult Health

## 2010-12-17 DIAGNOSIS — J309 Allergic rhinitis, unspecified: Secondary | ICD-10-CM

## 2010-12-17 NOTE — Patient Instructions (Addendum)
Change Claritin to Zyrtec 10mg  At bedtime   Saline nasal Twice daily   Begin Patanase Nasal 2 puffs At bedtime   Continue on Flonase 2 puffs in am  Begin Pataday 1 drop to eye.  Mucinex DM Twice daily  As needed  Cough/congestion  Please contact office for sooner follow up if symptoms do not improve or worsen or seek emergency care

## 2010-12-17 NOTE — Telephone Encounter (Signed)
I spoke with pt and she states she thinks she may have the flu but is not sure. Pt is coming in to see TP at 3:30

## 2010-12-21 NOTE — Progress Notes (Signed)
Subjective:    Patient ID: Krystal [name], female    DOB: 06-Jan-1930, 77 y.o.   MRN: [id number]  HPI  77 y/o WF here for a follow up visit... she has multiple medical problems as noted below...   ~  May 14, 2009:  add-on w/ cough, congestion, & yellow sinus drainage... denies fever, no c/s, no CP or SOB, etc... also c/o legs aching "from the Simvastatin 20mg "- but it is tolerable & she had worse symptoms on mult prev statins in the past... BP controlled on meds;  cardiac f/u DrWeintraub- stable;  GI eval DrGessner for GERD & bloating (Pantoprazole & Bentyl help)...  ~  February 05, 2010:  she's had a good yr- no new complaints or concerns... saw DrWeintraub 6/11- stable, no angina, BP controlled, no changes made...   ~  November 11, 2010:  23mo ROV     She saw DrGessner 3/12> hx GERD, c/o intermittent dysphagia, & started on Prevacid 30mg /d; EGD 4/12 showed 3cmHH, otherw neg, no stricture found.    She's been c/o inner ear, balance off, dizzy for a month & saw DrBates last week; she reports that he thought it was "coming from my stomach" & gave her Flonase Rx; he also wanted her to stop the Prevacid due to her bones and suggested OTC Zantac ("he scared me"- she wants Rx for Zantac150mg  bid).    She saw DrWeintraub last week and had severe drainage & "he took pity on me" & gave her a Depo shot, sl improved but symptoms persist    We discussed Rx w/ Antihist in AM, spray Saline Q1-2H during the day, take Mucinex 1-2 Bid w/ Fluids and use the Flonase Qhs;  we also agreed to Rx acute symptoms w/ Sterapred dosepak...  12/17/10 Acute OV  Complains of cough w/ white phlem, post nasal drip, watery eyes, sinus headache, sore throat, body aches x 1 month. pt finished prednisone and zpak yesterday. Still has runny nose and itchy eyes.  No discolored mucus or fever.              PROBLEM LIST:  ALLERGIC RHINITIS (ICD-477.9) - she uses OTC antihistamines + saline nasal spray Prn...    HYPERTENSION  (ICD-401.9) - controlled on TOPROL XL 25mg /d, NORVASC 10mg /d, & LOSARTAN 50mg - 1/2 tab daily... ~  CXR 12/09 showed some hyperaeration w/ flattening of diaphragms, NAD...  CORONARY ARTERY DISEASE (ICD-414.00) - followed by DrWeintraub & [facility]... on the above + ASA 81mg /d, & Statin for her Chol...  ~  baseline EKG shows a chr LBBB... ~  cath 10/03 w/ single vessel CAD- 90%CIRC treated w/ PTCA/ stent... ~  Cardiolite 10/06 showed no ischemia... repeat 12/08 showed no change- no ischemia, +diaph attenuation, EF= 64% w/ LBBB... ~  2DEcho 12/08 showed mild conc LVH, mild calcif AoV, no stenosis, RVsys sl up at 40-50 mmHg... ~  2DEcho 4/10 showed borderline conc LVH, norm LVF w/ EF= 55%, mild LA dil, mod MR, RVsys=42... ~  8/12:  She tells me that she saw DrRW last week & he did LABS & ECHO ==> pending.  VENOUS INSUFFICIENCY (ICD-459.81) - she knows to elim sodium, elevate legs, wear support hose (she is not on a diuretic)... she had VV surg in 2001 by [facility]...  HYPERLIPIDEMIA (ICD-272.4) -  insurance company will not cover Crestor, only generics.  now on SIMVASTATIN 20mg /d w/ some symptoms but tolerable... ~  FLP 8/09 on Crestor10 = TChol 154, Tg 132, HDL 41, LDL 86 (goal  is <70 for secondary prevent). ~  FLP 9/10 on Prav20 showed TChol 211, TG 136, HDL 37, LDL 160... rec> try to incr Prav to 40mg /d> intol. ~  FLP 2/11 on Simva20 showed TChol 168, TG 123, HDL 50, LDL 94... continue same. ~  FLP 11/11 on Simva20 showed TChol 146, TG 120, HDL 38, LDL 84  HIATAL HERNIA (ICD-553.3) & GERD (ICD-530.81) - followed by DrGessner & rec to incr PREVACID to 30mg /d; but she says "DrBates scared me & says I shouldn't be taking the Prevacid" therefore she wants ZANTAC 150mg  Bid... ~  last EGD 1/98 showed 3-4cmHH, mod reflux esophagitis, mild duodenitis...  ~  12/10:  seen by GI w/ GERD & bloating- Rx w/ Pantoprazole & Bentyl... ~  4/12: Hx GERD, c/o intermittent dysphagia, & EGD showed 3cmHH, otherw neg, no  stricture found; rec to incr Prevacid to 30mg /d, but she wants top change to [facility] as noted above...  DIVERTICULOSIS OF COLON (ICD-562.10) & COLONIC POLYPS (ICD-211.3) - on BENTYL 20mg  Prn. ~  colonoscopy 6/05 by DrSam showed divertics and two 4-12mm polyps= adenomatous... ~  colonosco[py 5/10 by DrGessner showed divertics, hems, tiny cecal polyp= adenoma... f/u planned 45yrs.  RENAL CYST (ICD-593.2) - [facility] and last sonar 4/08 was stable...  OSTEOARTHRITIS (ICD-715.90) - eval by Susann Givens 2/09 w/ stress fracture to 4th metatarsal left foot... she takes [facility] 7.5mg  Prn for hand arthritis & prev saw DrMyerdierks for this... she notes that the Mobic helps her BP!  LOW BACK PAIN SYNDROME (ICD-724.2) - she states that her prev LBP has resolved and she is active w/ exercise 3-4 days per week... she had a partial T7 & T8 compression fractures noted in 2003... she took Forteo but only for 8 months per her GYN.  OSTEOPOROSIS (ICD-733.00) - as above>  this is managed and followed by her GYN Debbora Dus, NP) w/ BMD's and currently on calcium and VitD 2000 daily... we donot have copies of her BMD's...  HEADACHE (ICD-784.0)  ANXIETY (ICD-300.00) - on LORAZEPAM 1mg - 1/2 to 1 tab three times a day as needed.  ANEMIA (ICD-285.9) - hx of anemia in the past... Hg= 14.6 on 11/09/07...  Health Maintenance - she had Pneumovax in the past w/ a reaction that she indicates lasted for 73yr...   Past Surgical History  Procedure Date  . Abdominal hysterectomy   . Varicose vein surgery   . Bladder repair   . Colonoscopy 07/2008    small adenoma, previous polyps also  . Cataract extraction     Outpatient Encounter Prescriptions as of 11/11/2010  Medication Sig Dispense Refill  . amLODipine (NORVASC) 10 MG tablet Take 1 tablet (10 mg total) by mouth daily.  90 tablet  3  . aspirin 81 MG tablet Take 81 mg by mouth daily.        . Calcium Carbonate-Vitamin D  600-400 MG-UNIT per tablet Take 1 tablet by mouth 2 (two) times daily.        . cetirizine (ZYRTEC) 10 MG tablet Take 10 mg by mouth daily as needed.        . Cholecalciferol (VITAMIN D3) 2000 UNITS TABS Take 2,000 Units by mouth daily.        Marland Kitchen dicyclomine (BENTYL) 10 MG capsule Take 10 mg by mouth. 1 every 6 hours daily      . LORazepam (ATIVAN) 1 MG tablet Take 1 mg by mouth at bedtime as needed.        Marland Kitchen  losartan (COZAAR) 50 MG tablet Take 1/2 tablet by mouth once daily       . meloxicam (MOBIC) 7.5 MG tablet Take 7.5 mg by mouth daily. As needed       . metoprolol succinate (TOPROL XL) 25 MG 24 hr tablet Take 1 tablet (25 mg total) by mouth daily.  90 tablet  3  . ranitidine (ZANTAC) 150 MG tablet Take 150 mg by mouth 2 (two) times daily.        . simvastatin (ZOCOR) 20 MG tablet Take 1 tablet (20 mg total) by mouth at bedtime.  90 tablet  3  . buPROPion (WELLBUTRIN SR) 100 MG 12 hr tablet Take 1 tablet (100 mg total) by mouth daily.  30 tablet  0  . lansoprazole (PREVACID SOLUTAB) 30 MG disintegrating tablet Take 1 tablet (30 mg total) by mouth daily before breakfast.  ==> CHANGED TO ZANTAC per request.     Facility-Administered Encounter Medications as of 11/11/2010  Medication Dose Route Frequency Provider Last Rate Last Dose  . 0.9 %  sodium chloride infusion  500 mL Intravenous Continuous Iva Boop, MD        Allergies  Allergen Reactions  . Alendronate Sodium     REACTION: pt states INTOL to Fosamax w/ esophagitis  . Cefdinir     REACTION: diarrhea  . Esomeprazole Magnesium     REACTION: pt states INTOL to Nexium  . Levofloxacin     REACTION: diarrhea  . Omeprazole     REACTION: pt states INTOL to Prilosec  . Penicillins   . Shellfish-Derived Products Hives, Nausea Only and Rash    Current Medications, Allergies, Past Medical History, Past Surgical History, Family History, and Social History were reviewed in [employer] record.    Review of  Systems       See HPI - all other systems neg except as noted...      The patient denies anorexia, fever, weight loss, weight gain, vision loss, decreased hearing, hoarseness, chest pain, syncope, dyspnea on exertion, peripheral edema, prolonged cough, headaches, hemoptysis, abdominal pain, melena, hematochezia, severe indigestion/heartburn, hematuria, incontinence, muscle weakness, suspicious skin lesions, transient blindness, difficulty walking, depression, unusual weight change, abnormal bleeding, enlarged lymph nodes, and angioedema.     Objective:   Physical Exam     WD, WN, 76 y/o WF in NAD... GENERAL:  Alert & oriented; pleasant & cooperative... HEENT:  [place]/AT, EOM-wnl, PERRLA, EACs-clear, TMs-wnl, NOSE-sl drainage-clear  THROAT-clear & wnl. NECK:  Supple w/ fairROM; no JVD; normal carotid impulses w/o bruits; no thyromegaly or nodules palpated; no lymphadenopathy. CHEST:  Clear to P & A; without wheezes/ rales/ or rhonchi heard... HEART:  Regular Rhythm; gr 1/6 SEM at LSB, no rubs, no gallops apprec... ABDOMEN:  Soft & nontender; normal bowel sounds; no organomegaly or masses detected. EXT: without deformities, mild arthritic changes; hx varicose veins/ +venous insuffic/ no edema. no muscle tenderness etc... NEURO:  CN's intact; no focal neuro deficits... DERM:  No lesions noted; no rash etc...   Assessment & Plan:

## 2010-12-21 NOTE — Assessment & Plan Note (Signed)
Flare :   Change Claritin to Zyrtec 10mg  At bedtime   Saline nasal Twice daily   Begin Patanase Nasal 2 puffs At bedtime   Continue on Flonase 2 puffs in am  Begin Pataday 1 drop to eye.  Mucinex DM Twice daily  As needed  Cough/congestion  Please contact office for sooner follow up if symptoms do not improve or worsen or seek emergency care

## 2010-12-23 ENCOUNTER — Telehealth: Payer: Self-pay | Admitting: Pulmonary Disease

## 2010-12-23 MED ORDER — PREDNISONE (PAK) 5 MG PO TABS: ORAL_TABLET | ORAL | Status: DC

## 2010-12-23 NOTE — Telephone Encounter (Signed)
Spoke with pt and notified of recs per SN. Pt verbalized understanding and rx was sent to pharm.  

## 2010-12-23 NOTE — Telephone Encounter (Signed)
Per SN---sounds like severe allergy problems----recs for pred dosepak  5 mg   6 day pack  To take as directed.  thanks

## 2010-12-23 NOTE — Telephone Encounter (Signed)
Spoke with pt. She states not doing much better since last seen on 12/17/10- was advised to start taking zyrtec, pataday eye drops, saline rinse bid, mucinex and flonase. She states that she is taking all of these meds and still has terrible PND and watery eyes. She wants to know if ENT ref is needed and if SN would want to call in clarinex for her to try. Please advise, thanks! Allergies  Allergen Reactions  . Alendronate Sodium     REACTION: pt states INTOL to Fosamax w/ esophagitis  . Cefdinir     REACTION: diarrhea  . Esomeprazole Magnesium     REACTION: pt states INTOL to Nexium  . Levofloxacin     REACTION: diarrhea  . Omeprazole     REACTION: pt states INTOL to Prilosec  . Penicillins   . Shellfish-Derived Products Hives, Nausea Only and Rash

## 2011-02-04 ENCOUNTER — Ambulatory Visit (INDEPENDENT_AMBULATORY_CARE_PROVIDER_SITE_OTHER): Payer: Medicare Other

## 2011-02-04 DIAGNOSIS — Z23 Encounter for immunization: Secondary | ICD-10-CM

## 2011-02-18 ENCOUNTER — Telehealth: Payer: Self-pay | Admitting: Internal Medicine

## 2011-02-18 NOTE — Telephone Encounter (Signed)
Pt aware.

## 2011-02-18 NOTE — Telephone Encounter (Signed)
Patient reports that she is having a lot of gas, diarrhea,mucus and lower abdominal pain and cramping. She has had 3 episodes today. She reports mostly cramping , gas then a very small amount of loose stool.  She reports that this started about a week ago.  She denies rectal bleeding.  C/O temp of 100 yesterday, but no fever today.  She reports that she was treated about a month ago for a sinus problem with a medrol dose pack, but no antibiotics.  Patient reports she had a similar problem several years ago when she saw Dr Corinda Gubler  he treated her with antibiotics at the time, she has scheduled an appt for December and is requesting antibiotics.  She does have hyoscyamine and I have asked her to start on it.  Dr Leone Payor please advise

## 2011-02-18 NOTE — Telephone Encounter (Signed)
I recommend a probiotic at this time She should reduce fiber in her diet Use her dicyclomine Sounds like a flare of IBS - not convinced antibiotics are appropriate at this time Keep REV  Call back sooner if true diarrhea problems occur

## 2011-03-03 ENCOUNTER — Ambulatory Visit
Admission: RE | Admit: 2011-03-03 | Discharge: 2011-03-03 | Disposition: A | Discharge status: Home / Self Care | Payer: Medicare Other | Source: Ambulatory Visit | Attending: Obstetrics and Gynecology | Admitting: Obstetrics and Gynecology

## 2011-03-17 ENCOUNTER — Ambulatory Visit: Payer: Medicare Other | Admitting: Internal Medicine

## 2011-03-26 ENCOUNTER — Telehealth: Payer: Self-pay | Admitting: Pulmonary Disease

## 2011-03-26 MED ORDER — AZITHROMYCIN 250 MG PO TABS: ORAL_TABLET | ORAL | Status: AC

## 2011-03-26 NOTE — Telephone Encounter (Signed)
I spoke with pt and is aware of MW recs. She voiced ehr understanding and would like rx called into [facility] [address]

## 2011-03-26 NOTE — Telephone Encounter (Signed)
I spoke with pt and she c/o cough w/ green to yellow phlem, body aches, sore throat, headache, nasal congestion, chest congestion, chest feels sore from the cough x 1 day. Pt denies any nausea, vomiting, fever. Pt has been taking mucinex, delsym, and advil. Pt states her husband had the flu last week and was given tamiflu but she didn;t think she would get it. SN nor TP is here this week and no available openings. Will forward to soc of the day. Please advise Dr. Sherene Sires, thanks  Allergies  Allergen Reactions  . Alendronate Sodium     REACTION: pt states INTOL to Fosamax w/ esophagitis  . Cefdinir     REACTION: diarrhea  . Esomeprazole Magnesium     REACTION: pt states INTOL to Nexium  . Levofloxacin     REACTION: diarrhea  . Omeprazole     REACTION: pt states INTOL to Prilosec  . Penicillins   . Shellfish-Derived Products Hives, Nausea Only and Rash

## 2011-03-26 NOTE — Telephone Encounter (Signed)
Green mucus not typical of flu, rz zpack and ov by end of week if not turning the cornder

## 2011-04-04 ENCOUNTER — Ambulatory Visit: Payer: Medicare Other | Admitting: Adult Health

## 2011-04-04 ENCOUNTER — Telehealth: Payer: Self-pay | Admitting: Pulmonary Disease

## 2011-04-04 MED ORDER — PREDNISONE (PAK) 5 MG PO TABS: ORAL_TABLET | ORAL | Status: DC

## 2011-04-04 MED ORDER — SULFAMETHOXAZOLE-TRIMETHOPRIM 800-160 MG PO TABS: 1.0000 | ORAL_TABLET | Freq: Two times a day (BID) | ORAL | Status: AC

## 2011-04-04 NOTE — Telephone Encounter (Signed)
Pt c/o cough and congestion for 2-3 weeks. Dr. Sherene Sires had sent her a Zpak before Christmas and then she also went to [facility] and was prescribed Prednisone taper, Amoxicillin and Hycodan. She says her sxs never got any better. She still has cough with green mucus, headache and sinus congestion. She denies any fever or sore throat. She is using Mucinex DM once dailyand taking Advil as needed. She wants to see SN bur will take any recs he has for her via phone. Pls advise. Allergies  Allergen Reactions  . Alendronate Sodium     REACTION: pt states INTOL to Fosamax w/ esophagitis  . Cefdinir     REACTION: diarrhea  . Esomeprazole Magnesium     REACTION: pt states INTOL to Nexium  . Levofloxacin     REACTION: diarrhea  . Omeprazole     REACTION: pt states INTOL to Prilosec  . Penicillins   . Shellfish-Derived Products Hives, Nausea Only and Rash

## 2011-04-04 NOTE — Telephone Encounter (Signed)
Per SN--needs ov with SN next week---can use 1-8 AT 12.   Ok to call in septra ds  #14  1 po bid and increase the mucinex 2 po bid with plenty of fluids, dexpak 6 day pack to take as directed.  thanks

## 2011-04-04 NOTE — Telephone Encounter (Signed)
Spoke with pt and notified of recs per SN. She took the appt for 04/08/11. Rx for septra and dosepack were sent.

## 2011-04-08 ENCOUNTER — Encounter: Payer: Self-pay | Admitting: Pulmonary Disease

## 2011-04-08 ENCOUNTER — Ambulatory Visit (INDEPENDENT_AMBULATORY_CARE_PROVIDER_SITE_OTHER)
Admission: RE | Admit: 2011-04-08 | Discharge: 2011-04-08 | Disposition: A | Discharge status: Home / Self Care | Payer: Medicare Other | Source: Ambulatory Visit | Attending: Pulmonary Disease | Admitting: Pulmonary Disease

## 2011-04-08 ENCOUNTER — Other Ambulatory Visit (INDEPENDENT_AMBULATORY_CARE_PROVIDER_SITE_OTHER): Payer: Medicare Other

## 2011-04-08 ENCOUNTER — Ambulatory Visit (INDEPENDENT_AMBULATORY_CARE_PROVIDER_SITE_OTHER): Payer: Medicare Other | Admitting: Pulmonary Disease

## 2011-04-08 VITALS — BP 128/64 | HR 78 | Temp 97.4°F | Ht 65.0 in | Wt 165.2 lb

## 2011-04-08 DIAGNOSIS — E785 Hyperlipidemia, unspecified: Secondary | ICD-10-CM

## 2011-04-08 DIAGNOSIS — M545 Low back pain: Secondary | ICD-10-CM

## 2011-04-08 DIAGNOSIS — I2510 Atherosclerotic heart disease of native coronary artery without angina pectoris: Secondary | ICD-10-CM

## 2011-04-08 DIAGNOSIS — M810 Age-related osteoporosis without current pathological fracture: Secondary | ICD-10-CM

## 2011-04-08 DIAGNOSIS — K219 Gastro-esophageal reflux disease without esophagitis: Secondary | ICD-10-CM

## 2011-04-08 DIAGNOSIS — D126 Benign neoplasm of colon, unspecified: Secondary | ICD-10-CM

## 2011-04-08 DIAGNOSIS — J209 Acute bronchitis, unspecified: Secondary | ICD-10-CM

## 2011-04-08 DIAGNOSIS — I872 Venous insufficiency (chronic) (peripheral): Secondary | ICD-10-CM

## 2011-04-08 DIAGNOSIS — F411 Generalized anxiety disorder: Secondary | ICD-10-CM

## 2011-04-08 DIAGNOSIS — I10 Essential (primary) hypertension: Secondary | ICD-10-CM

## 2011-04-08 DIAGNOSIS — M1990 Unspecified osteoarthritis, unspecified site: Secondary | ICD-10-CM

## 2011-04-08 DIAGNOSIS — K5730 Diverticulosis of large intestine without perforation or abscess without bleeding: Secondary | ICD-10-CM

## 2011-04-08 LAB — CBC WITH DIFFERENTIAL/PLATELET
Basophils Relative: 0.4 % (ref 0.0–3.0)
Eosinophils Relative: 1.6 % (ref 0.0–5.0)
Hemoglobin: 14.4 g/dL (ref 12.0–15.0)
Lymphocytes Relative: 32.2 % (ref 12.0–46.0)
MCHC: 33.5 g/dL (ref 30.0–36.0)
Neutro Abs: 6.8 10*3/uL (ref 1.4–7.7)
Neutrophils Relative %: 60.1 % (ref 43.0–77.0)
RBC: 4.49 Mil/uL (ref 3.87–5.11)
WBC: 11.3 10*3/uL — ABNORMAL HIGH (ref 4.5–10.5)

## 2011-04-08 LAB — HEPATIC FUNCTION PANEL
ALT: 19 U/L (ref 0–35)
Bilirubin, Direct: 0.1 mg/dL (ref 0.0–0.3)
Total Bilirubin: 0.5 mg/dL (ref 0.3–1.2)
Total Protein: 6.8 g/dL (ref 6.0–8.3)

## 2011-04-08 LAB — BASIC METABOLIC PANEL
BUN: 18 mg/dL (ref 6–23)
Calcium: 8.6 mg/dL (ref 8.4–10.5)
Chloride: 102 mEq/L (ref 96–112)
Creatinine, Ser: 1 mg/dL (ref 0.4–1.2)

## 2011-04-08 MED ORDER — PREDNISONE 10 MG PO TABS: ORAL_TABLET | ORAL | Status: DC

## 2011-04-08 MED ORDER — LORAZEPAM 1 MG PO TABS: 1.0000 mg | ORAL_TABLET | Freq: Every evening | ORAL | Status: DC | PRN

## 2011-04-08 MED ORDER — HYDROCOD POLST-CHLORPHEN POLST 10-8 MG/5ML PO LQCR
5.0000 mL | Freq: Two times a day (BID) | ORAL | Status: DC
Start: 2011-04-08 — End: 2012-03-16

## 2011-04-08 MED ORDER — METHYLPREDNISOLONE ACETATE 80 MG/ML IJ SUSP
80.0000 mg | Freq: Once | INTRAMUSCULAR | Status: AC
  Administered 2011-04-08: 80 mg via INTRAMUSCULAR

## 2011-04-08 NOTE — Progress Notes (Signed)
Subjective:    Patient ID: Krystal [name], female    DOB: [birth year], 78 y.o.   MRN: [id number]  HPI 78 y/o WF here for a follow up visit... she has multiple medical problems as noted below...   ~  May 14, 2009:  add-on w/ cough, congestion, & yellow sinus drainage... denies fever, no c/s, no CP or SOB, etc... also c/o legs aching "from the Simvastatin 20mg "- but it is tolerable & she had worse symptoms on mult prev statins in the past... BP controlled on meds;  cardiac f/u DrWeintraub- stable;  GI eval DrGessner for GERD & bloating (Pantoprazole & Bentyl help)...  ~  February 05, 2010:  she's had a good yr- no new complaints or concerns... saw DrWeintraub 6/11- stable, no angina, BP controlled, no changes made...   ~  November 11, 2010:  40mo ROV     She saw DrGessner 3/12> hx GERD, c/o intermittent dysphagia, & started on Prevacid 30mg /d; EGD 4/12 showed 3cmHH, otherw neg, no stricture found.    She's been c/o inner ear, balance off, dizzy for a month & saw DrBates last week; she reports that he thought it was "coming from my stomach" & gave her Flonase Rx; he also wanted her to stop the Prevacid due to her bones and suggested OTC Zantac ("he scared me"- she wants Rx for Zantac150mg  bid).    She saw DrWeintraub last week and had severe drainage & "he took pity on me" & gave her a Depo shot, sl improved but symptoms persist    We discussed Rx w/ Antihist in AM, spray Saline Q1-2H during the day, take Mucinex 1-2 Bid w/ Fluids and use the Flonase Qhs;  we also agreed to Rx acute symptoms w/ Sterapred dosepak...  ~  April 08, 2011:  76mo ROV & add-on for bronchitic symptoms> c/o cough, congestion, green sput, some wheezing x 3wks; notes assoc HA, aches/ pains, & low grade temp ~100; denies CP, SOB, etc; ZPak called in + Mucinex but no better; went to [facility] & given Pred, ?Augmentin, cough syrup; now c/o persistent deep cough esp at night;  We discussed further eval w/ CXR (?COPD, chr changes, NAD) &  LABS (wnl);  Rx w/ SeptraDS (she lists allergy to Levaquin), Tussionex, Mucinex + Fluids, & Depo80/ Pred 10mg - 3d taper...    >She saw DrESL 10/12 for recurrent sinus infections, frontal HA, PND, dry cough; he did allergy testing- ?results (not indicated in his note); rec to take saline wash, Claritin, Flonase, Patanase, Medrol 4mg  prolonged course...    >She saw ENT DrByers 10/12 w/ CT sinuses reported to be normal & nothing found, he rec allergy eval...    >She is followed by DrGessner & needs a much more vigorous antireflux regimen w/ her chr symptoms...          PROBLEM LIST:  ALLERGIC RHINITIS (ICD-477.9) - she uses OTC antihistamines + saline nasal spray Prn... "I have alot of mucous"- Rx w/ antihist, mucinex, cough syrups (wants Tussionex)... she had some left ear discomfort, dry skin & DrBates suggested mineral oil drop or two on occas Prn. ~  10/12:  She saw DrByers for ENT eval- neg w/ normal CT sinuses;  She saw DrESL for allergy eval & ?result of skin tests but not rec for allergy shots- given saline wash, Claritin, Flonase, Patanase, Medrol 4mg  prolonged course...  HYPERTENSION (ICD-401.9) - controlled on TOPROL XL 25mg /d, NORVASC 10mg /d, & LOSARTAN 50mg - 1/2 tab daily... ~  CXR 12/09  showed some hyperaeration w/ flattening of diaphragms, NAD.Marland Kitchen. ~  8/12: BP= 136/78 here today & even better at home- feeling well, tolerating meds well; denies HA, fatigue, visual changes, CP, palipit, dizziness, syncope, dyspnea, edema, etc... ~  1/13: BP= 128/64 & she is essent asymptomatic...  CORONARY ARTERY DISEASE (ICD-414.00) - followed by DrWeintraub & [facility]... on the above + ASA 81mg /d, & Statin for her Chol... she denies CP, palpit, change in SOB, edema, etc; she exercises by walking... ~  baseline EKG shows a chr LBBB... ~  cath 10/03 w/ single vessel CAD- 90%CIRC treated w/ PTCA/ stent... ~  Cardiolite 10/06 showed no ischemia... repeat 12/08 showed no change- no ischemia, +diaph attenuation,  EF= 64% w/ LBBB... ~  2DEcho 12/08 showed mild conc LVH, mild calcif AoV, no stenosis, RVsys sl up at 40-50 mmHg... ~  2DEcho 4/10 showed borderline conc LVH, norm LVF w/ EF= 55%, mild LA dil, mod MR, RVsys=42... ~  8/12:  She tells me that she saw DrRW last week & he did LABS & ECHO ==> pending.  VENOUS INSUFFICIENCY (ICD-459.81) - she knows to elim sodium, elevate legs, wear support hose (she is not on a diuretic)... she had VV surg in 2001 by [facility]...  HYPERLIPIDEMIA (ICD-272.4) -  insurance company will not cover Crestor, only generics... she notes musc "cramps" on mult statins prev... now on SIMVASTATIN 20mg /d w/ some symptoms but tolerable... ~  FLP 8/09 on Crestor10 = TChol 154, Tg 132, HDL 41, LDL 86 (goal is <70 for secondary prevent). ~  FLP 9/10 on Prav20 showed TChol 211, TG 136, HDL 37, LDL 160... rec> try to incr Prav to 40mg /d> intol. ~  FLP 2/11 on Simva20 showed TChol 168, TG 123, HDL 50, LDL 94... continue same. ~  FLP 11/11 on Simva20 showed TChol 146, TG 120, HDL 38, LDL 84  HIATAL HERNIA (ICD-553.3) & GERD (ICD-530.81) - followed by DrGessner & rec to incr PREVACID to 30mg /d; but she says "DrBates scared me & says I shouldn't be taking the Prevacid" therefore she wants ZANTAC 150mg  Bid... ~  last EGD 1/98 showed 3-4cmHH, mod reflux esophagitis, mild duodenitis...  ~  12/10:  seen by GI w/ GERD & bloating- Rx w/ Pantoprazole & Bentyl... ~  4/12: Hx GERD, c/o intermittent dysphagia, & EGD showed 3cmHH, otherw neg, no stricture found; rec to incr Prevacid to 30mg /d, but she wants top change to [facility] as noted above...  DIVERTICULOSIS OF COLON (ICD-562.10) & COLONIC POLYPS (ICD-211.3) - on BENTYL 20mg  Prn. ~  colonoscopy 6/05 by DrSam showed divertics and two 4-29mm polyps= adenomatous... ~  colonosco[py 5/10 by DrGessner showed divertics, hems, tiny cecal polyp= adenoma... f/u planned 45yrs.  RENAL CYST (ICD-593.2) - [facility]  [facility] and last sonar 4/08 was stable...  OSTEOARTHRITIS (ICD-715.90) - eval by Susann Givens 2/09 w/ stress fracture to 4th metatarsal left foot... she takes [facility] 7.5mg  Prn for hand arthritis & prev saw DrMyerdierks for this... she notes that the Mobic helps her BP!  LOW BACK PAIN SYNDROME (ICD-724.2) - she states that her prev LBP has resolved and she is active w/ exercise 3-4 days per week... she had a partial T7 & T8 compression fractures noted in 2003... she took Forteo but only for 8 months per her GYN.  OSTEOPOROSIS (ICD-733.00) - as above>  this is managed and followed by her GYN Debbora Dus, NP) w/ BMD's and currently on calcium and VitD 2000 daily... we donot have  copies of her BMD's...  HEADACHE (ICD-784.0)  ANXIETY (ICD-300.00) - on LORAZEPAM 1mg - 1/2 to 1 tab three times a day as needed.  Hx of ANEMIA - hx of anemia in the past... Hg= 14.6 on 11/09/07... Hg= 14.4 on 04/08/11...  Health Maintenance - she had Pneumovax in the past w/ a reaction that she indicates lasted for 22yr...   Past Surgical History  Procedure Date  . Abdominal hysterectomy   . Varicose vein surgery   . Bladder repair   . Colonoscopy 08/17/2008    adenomatous polyp, diverticulosis, external hemorrhoids  . Cataract extraction   . Upper gastrointestinal endoscopy 07/03/2010    hiatal hernia     Outpatient Encounter Prescriptions as of 04/08/2011  Medication Sig Dispense Refill  . amLODipine (NORVASC) 10 MG tablet Take 1 tablet (10 mg total) by mouth daily.  90 tablet  3  . aspirin 81 MG tablet Take 81 mg by mouth daily.        Marland Kitchen azelastine (ASTELIN) 137 MCG/SPRAY nasal spray Use 1-2 sprays in each nostril two times daily as needed  30 mL  2  . buPROPion (WELLBUTRIN SR) 100 MG 12 hr tablet Take 1 tablet (100 mg total) by mouth daily.  90 tablet  3  . Calcium Carbonate-Vitamin D 600-400 MG-UNIT per tablet Take 1 tablet by mouth 2 (two) times daily.        . cetirizine (ZYRTEC) 10 MG tablet Take 10 mg by mouth daily as  needed.        . Cholecalciferol (VITAMIN D3) 2000 UNITS TABS Take 2,000 Units by mouth daily.        Marland Kitchen dicyclomine (BENTYL) 10 MG capsule Take 10 mg by mouth. 1 every 6 hours daily      . fluticasone (FLONASE) 50 MCG/ACT nasal spray Place 2 sprays into the nose daily.        . lansoprazole (PREVACID) 15 MG capsule Take 15 mg by mouth 2 (two) times daily.        Marland Kitchen LORazepam (ATIVAN) 1 MG tablet Take 1 tablet (1 mg total) by mouth at bedtime as needed.  30 tablet  5  . losartan (COZAAR) 50 MG tablet Take 1/2 tablet by mouth once daily  45 tablet  3  . meloxicam (MOBIC) 7.5 MG tablet Take 7.5 mg by mouth daily. As needed       . metoprolol succinate (TOPROL XL) 25 MG 24 hr tablet Take 1 tablet (25 mg total) by mouth daily.  90 tablet  3  . simvastatin (ZOCOR) 20 MG tablet Take 1 tablet (20 mg total) by mouth at bedtime.  90 tablet  3  . chlorpheniramine-HYDROcodone (TUSSIONEX PENNKINETIC ER) 10-8 MG/5ML LQCR Take 5 mLs by mouth every 12 (twelve) hours.  140 mL  2  . predniSONE (DELTASONE) 10 MG tablet Take 1 tab bid x 3 days, 1 daily x 3 days, 1/2 tablet daily until gone  12 tablet  0  . sulfamethoxazole-trimethoprim (BACTRIM DS) 800-160 MG per tablet Take 1 tablet by mouth 2 (two) times daily.  14 tablet  0    Allergies  Allergen Reactions  . Alendronate Sodium     REACTION: pt states INTOL to Fosamax w/ esophagitis  . Cefdinir     REACTION: diarrhea  . Esomeprazole Magnesium     REACTION: pt states INTOL to Nexium  . Levofloxacin     REACTION: diarrhea  . Omeprazole     REACTION: pt states INTOL to Prilosec  .  Penicillins   . Shellfish-Derived Products Hives, Nausea Only and Rash    Current Medications, Allergies, Past Medical History, Past Surgical History, Family History, and Social History were reviewed in [employer] record.    Review of Systems      See HPI - all other systems neg except as noted...      The patient denies anorexia, fever, weight loss,  weight gain, vision loss, decreased hearing, hoarseness, chest pain, syncope, dyspnea on exertion, peripheral edema, prolonged cough, headaches, hemoptysis, abdominal pain, melena, hematochezia, severe indigestion/heartburn, hematuria, incontinence, muscle weakness, suspicious skin lesions, transient blindness, difficulty walking, depression, unusual weight change, abnormal bleeding, enlarged lymph nodes, and angioedema.     Objective:   Physical Exam    WD, WN, 78 y/o WF in NAD... GENERAL:  Alert & oriented; pleasant & cooperative... HEENT:  [place]/AT, EOM-wnl, PERRLA, EACs-clear, TMs-wnl, NOSE-sl drainage, THROAT-clear & wnl. NECK:  Supple w/ fairROM; no JVD; normal carotid impulses w/o bruits; no thyromegaly or nodules palpated; no lymphadenopathy. CHEST:  Clear to P & A; without wheezes/ rales/ or rhonchi heard... HEART:  Regular Rhythm; gr 1/6 SEM at LSB, no rubs, no gallops apprec... ABDOMEN:  Soft & nontender; normal bowel sounds; no organomegaly or masses detected. EXT: without deformities, mild arthritic changes; hx varicose veins/ +venous insuffic/ no edema. no muscle tenderness etc... NEURO:  CN's intact; no focal neuro deficits... DERM:  No lesions noted; no rash etc...  RADIOLOGY DATA:  Reviewed in the EPIC EMR & discussed w/ the patient...    >>CXR today shows some hyperinflation, ectasia & tort Ao, some minor scarring, osteopenia w/ chr compression in mid TSpine, NAD...  LABORATORY DATA:  Reviewed in the EPIC EMR & discussed w/ the patient...    >>CBC, CMet, TSH> all essent wnl...   Assessment & Plan:   AR, Chronic Sinus Problems>  We reviewed regimen w/ Antihist Qam, Saline nasal mist every 1-2H during the day, Mucinex 2 tabsBid, & Flonase Qhs... She has now had both ENT & Allergy evals...  HBP>  Controlled on meds, continue same...  CAD>  Followed by DrWeintraub on meds listed; seen recently & he did labs according to the pt; copies pending.  Ven Insuffic>  She had VV  surg in 2001; she knows to elim salt, elevate, wear support hose...  HYPERLIPID>  Stable on Simva20, recent labs per DrRW pending...  GI> HH, GERD, Divertics, Polyps>  Wants change to H2Blockers eg- ZANTAC Rx...  DJD, LBP>  Evaluated by Susann Givens in past; prev hx partial T7-T8 compression...  Osteoporosis>  Managed by her GYN; on Calcium, MVI, Vit D; w/ the above compression she took Forteo in past but only for 8months...  Anxiety/ Depression>  On Wellbutrin for depression & requests refill;  On Lorazepam for nerves as well...   Patient's Medications  New Prescriptions   CHLORPHENIRAMINE-HYDROCODONE (TUSSIONEX PENNKINETIC ER) 10-8 MG/5ML LQCR    Take 5 mLs by mouth every 12 (twelve) hours.   PREDNISONE (DELTASONE) 10 MG TABLET    Take 1 tab bid x 3 days, 1 daily x 3 days, 1/2 tablet daily until gone  Previous Medications   AMLODIPINE (NORVASC) 10 MG TABLET    Take 1 tablet (10 mg total) by mouth daily.   ASPIRIN 81 MG TABLET    Take 81 mg by mouth daily.     AZELASTINE (ASTELIN) 137 MCG/SPRAY NASAL SPRAY    Use 1-2 sprays in each nostril two times daily as needed   BUPROPION [facility]  SR) 100 MG 12 HR TABLET    Take 1 tablet (100 mg total) by mouth daily.   CALCIUM CARBONATE-VITAMIN D 600-400 MG-UNIT PER TABLET    Take 1 tablet by mouth 2 (two) times daily.     CETIRIZINE (ZYRTEC) 10 MG TABLET    Take 10 mg by mouth daily as needed.     CHOLECALCIFEROL (VITAMIN D3) 2000 UNITS TABS    Take 2,000 Units by mouth daily.     DICYCLOMINE (BENTYL) 10 MG CAPSULE    Take 10 mg by mouth. 1 every 6 hours daily   FLUTICASONE (FLONASE) 50 MCG/ACT NASAL SPRAY    Place 2 sprays into the nose daily.     LANSOPRAZOLE (PREVACID) 15 MG CAPSULE    Take 15 mg by mouth 2 (two) times daily.     LOSARTAN (COZAAR) 50 MG TABLET    Take 1/2 tablet by mouth once daily   MELOXICAM (MOBIC) 7.5 MG TABLET    Take 7.5 mg by mouth daily. As needed    METOPROLOL SUCCINATE (TOPROL XL) 25 MG 24 HR TABLET    Take 1 tablet  (25 mg total) by mouth daily.   SIMVASTATIN (ZOCOR) 20 MG TABLET    Take 1 tablet (20 mg total) by mouth at bedtime.  Modified Medications   Modified Medication Previous Medication   LORAZEPAM (ATIVAN) 1 MG TABLET LORazepam (ATIVAN) 1 MG tablet      Take 1 tablet (1 mg total) by mouth at bedtime as needed.    Take 1 mg by mouth at bedtime as needed.    Discontinued Medications   PREDNISONE (STERAPRED UNI-PAK) 5 MG TABS    Take as directed   PREDNISONE (STERAPRED UNI-PAK) 5 MG TABS    6 day dose pack as directed   RANITIDINE (ZANTAC) 150 MG TABLET    Take 1 tablet (150 mg total) by mouth 2 (two) times daily.   RANITIDINE (ZANTAC) 150 MG TABLET    Take 1 tablet (150 mg total) by mouth 2 (two) times daily.

## 2011-04-08 NOTE — Patient Instructions (Signed)
Today we updated your med list in our EPIC system...    Continue your current medications the same...  We decided to treat the airway inflammation w/ a Depo shot & Prednisone tapering schedule...    We also wrote a prescription for TUSSIONEX to take every 12H as needed for cough...    You may take tylenol etc as needed...  Today we did your follow up CXR & blood work...    Please call the PHONE TREE in a few days for your results...    Dial [id number] & when prompted enter your patient number followed by the # symbol...    Your patient number is:  [phone]  Call for any questions...  Let's plan our routine f/u visit in 6 month's time.Marland KitchenMarland Kitchen

## 2011-04-30 ENCOUNTER — Other Ambulatory Visit: Payer: Self-pay | Admitting: Internal Medicine

## 2011-04-30 MED ORDER — LANSOPRAZOLE 15 MG PO CPDR: 15.0000 mg | DELAYED_RELEASE_CAPSULE | Freq: Two times a day (BID) | ORAL | Status: DC

## 2011-04-30 NOTE — Telephone Encounter (Signed)
Lansoprazole refilled for 1 year sent to [facility].

## 2011-05-01 NOTE — Telephone Encounter (Signed)
Medication came back as an e-prescribing error. Called in the rx to [facility] at [phone]. For Lansoprazole 15 mg 1 twice a day #180 with 3 refills.

## 2011-05-12 ENCOUNTER — Ambulatory Visit: Payer: Medicare Other | Admitting: Pulmonary Disease

## 2011-06-02 ENCOUNTER — Other Ambulatory Visit: Payer: Self-pay | Admitting: Obstetrics

## 2011-06-02 DIAGNOSIS — Z1231 Encounter for screening mammogram for malignant neoplasm of breast: Secondary | ICD-10-CM

## 2011-06-17 ENCOUNTER — Other Ambulatory Visit: Payer: Self-pay | Admitting: Orthopaedic Surgery

## 2011-06-17 DIAGNOSIS — M79606 Pain in leg, unspecified: Secondary | ICD-10-CM

## 2011-06-18 ENCOUNTER — Ambulatory Visit
Admission: RE | Admit: 2011-06-18 | Discharge: 2011-06-18 | Disposition: A | Discharge status: Home / Self Care | Payer: Medicare Other | Source: Ambulatory Visit | Attending: Orthopaedic Surgery | Admitting: Orthopaedic Surgery

## 2011-06-18 DIAGNOSIS — M79606 Pain in leg, unspecified: Secondary | ICD-10-CM

## 2011-06-18 HISTORY — PX: OTHER SURGICAL HISTORY: SHX169

## 2011-07-07 ENCOUNTER — Ambulatory Visit: Payer: Medicare Other

## 2011-07-28 ENCOUNTER — Telehealth: Payer: Self-pay | Admitting: Pulmonary Disease

## 2011-07-28 MED ORDER — SULFAMETHOXAZOLE-TMP DS 800-160 MG PO TABS: 1.0000 | ORAL_TABLET | Freq: Two times a day (BID) | ORAL | Status: AC

## 2011-07-28 NOTE — Telephone Encounter (Signed)
Per SN---ok for pt to have bactrim ds  #20  1 po bid until gone.  Called and left message with family for the pt to call back.

## 2011-07-28 NOTE — Telephone Encounter (Signed)
Pt  Called me back and she is aware of SN recs.

## 2011-07-28 NOTE — Telephone Encounter (Signed)
I spoke with pt and she c/o cough w/ thick yellow phlem, some wheezing, runny nose, nasal congestion, PND x about 3 weeks. Denies any f/c/s/n/v. Pt has been taking zyrtec. Pt is wanting bactrim ds called in for her and she does not want prednisone called in for her. Please advise Dr. Kriste Basque, thanks  Allergies  Allergen Reactions  . Alendronate Sodium     REACTION: pt states INTOL to Fosamax w/ esophagitis  . Cefdinir     REACTION: diarrhea  . Esomeprazole Magnesium     REACTION: pt states INTOL to Nexium  . Levofloxacin     REACTION: diarrhea  . Omeprazole     REACTION: pt states INTOL to Prilosec  . Penicillins   . Shellfish-Derived Products Hives, Nausea Only and Rash      [facility] [address]

## 2011-08-04 ENCOUNTER — Ambulatory Visit
Admission: RE | Admit: 2011-08-04 | Discharge: 2011-08-04 | Disposition: A | Discharge status: Home / Self Care | Payer: Medicare Other | Source: Ambulatory Visit | Attending: Obstetrics | Admitting: Obstetrics

## 2011-08-04 DIAGNOSIS — Z1231 Encounter for screening mammogram for malignant neoplasm of breast: Secondary | ICD-10-CM

## 2011-08-06 ENCOUNTER — Other Ambulatory Visit: Payer: Self-pay | Admitting: *Deleted

## 2011-08-06 MED ORDER — AZELASTINE HCL 0.1 % NA SOLN: NASAL | Status: DC

## 2011-09-04 ENCOUNTER — Telehealth: Payer: Self-pay | Admitting: Internal Medicine

## 2011-09-04 NOTE — Telephone Encounter (Signed)
Patient with mid abdominal pain for the last few weeks.  She reports that she has tried dicyclomine with no relief.  She reports pain is worse this week than it was last.  She will come in and see Mike Gip PA tomorrow.

## 2011-09-05 ENCOUNTER — Encounter: Payer: Self-pay | Admitting: Physician Assistant

## 2011-09-05 ENCOUNTER — Ambulatory Visit (INDEPENDENT_AMBULATORY_CARE_PROVIDER_SITE_OTHER): Payer: Medicare Other | Admitting: Physician Assistant

## 2011-09-05 VITALS — BP 130/70 | HR 64 | Ht 66.0 in | Wt 161.2 lb

## 2011-09-05 DIAGNOSIS — R109 Unspecified abdominal pain: Secondary | ICD-10-CM

## 2011-09-05 DIAGNOSIS — R197 Diarrhea, unspecified: Secondary | ICD-10-CM

## 2011-09-05 DIAGNOSIS — R1030 Lower abdominal pain, unspecified: Secondary | ICD-10-CM

## 2011-09-05 DIAGNOSIS — K5792 Diverticulitis of intestine, part unspecified, without perforation or abscess without bleeding: Secondary | ICD-10-CM

## 2011-09-05 DIAGNOSIS — K5732 Diverticulitis of large intestine without perforation or abscess without bleeding: Secondary | ICD-10-CM

## 2011-09-05 MED ORDER — DICYCLOMINE HCL 10 MG PO CAPS: ORAL_CAPSULE | ORAL | Status: DC

## 2011-09-05 MED ORDER — CIPROFLOXACIN HCL 500 MG PO TABS: 500.0000 mg | ORAL_TABLET | Freq: Two times a day (BID) | ORAL | Status: AC

## 2011-09-05 NOTE — Patient Instructions (Signed)
We have sent prescriptions to [facility] [address] 1. Dicyclomine 2. Cipro Continue the Align once tablet daily.  We have given you more samples.  Call us back when you have finished the antibiotic if you are not feeling better.

## 2011-09-05 NOTE — Progress Notes (Signed)
Agree with Ms. Esterwood's assessment and plan. Kaydyn Chism E. [name], MD, FACG   

## 2011-09-05 NOTE — Progress Notes (Signed)
Subjective:    Patient ID: Krystal [name], female    DOB: 18-Feb-1930, 78 y.o.   MRN: [id number]  HPI Krystal [name] is very nice 78 year old white female known to Dr. Leone Payor, and more remotely Dr. Victorino Dike. She has history of chronic GERD, currently controlled on Prevacid. She had last colonoscopy in April of 2010 at 1 diminutive cecal polyp which was adenomatous and moderate sigmoid diverticulosis. She is scheduled for 5 year followup.  She comes in today stating that she's been having problems over the past month or so with lower  Abdominal  Pain, mucus in her stools and more frequent bowel movements as well as some queasiness. She says she has had similar symptoms in the past and that this usually bothers her more in the spring time. She's also noted a lot of gas. He is not had any fever or chills and says that her appetite has been okay her weight has been stable. She describes her abdominal  pain as being in the lower abdomen and crampy and sore in nature. She has not noted any melena or hematochezia. She says she's been treated with antibiotics in the past and this has helped quite a bit.    Review of Systems  Constitutional: Negative.   HENT: Negative.   Eyes: Negative.   Respiratory: Negative.   Cardiovascular: Negative.   Gastrointestinal: Positive for abdominal pain and diarrhea.  Genitourinary: Negative.   Musculoskeletal: Negative.   Neurological: Negative.   Hematological: Negative.   Psychiatric/Behavioral: Negative.    Outpatient Prescriptions Prior to Visit  Medication Sig Dispense Refill  . amLODipine (NORVASC) 10 MG tablet Take 1 tablet (10 mg total) by mouth daily.  90 tablet  3  . aspirin 81 MG tablet Take 81 mg by mouth daily.        Marland Kitchen azelastine (ASTELIN) 137 MCG/SPRAY nasal spray Use 1-2 sprays in each nostril two times daily as needed  30 mL  5  . buPROPion (WELLBUTRIN SR) 100 MG 12 hr tablet Take 1 tablet (100 mg total) by mouth daily.  90 tablet  3  . Calcium  Carbonate-Vitamin D 600-400 MG-UNIT per tablet Take 1 tablet by mouth 2 (two) times daily.        . cetirizine (ZYRTEC) 10 MG tablet Take 10 mg by mouth daily as needed.        . chlorpheniramine-HYDROcodone (TUSSIONEX PENNKINETIC ER) 10-8 MG/5ML LQCR Take 5 mLs by mouth every 12 (twelve) hours.  140 mL  2  . Cholecalciferol (VITAMIN D3) 2000 UNITS TABS Take 2,000 Units by mouth daily.        Marland Kitchen dicyclomine (BENTYL) 10 MG capsule Take 10 mg by mouth. 1 every 6 hours daily      . fluticasone (FLONASE) 50 MCG/ACT nasal spray Place 2 sprays into the nose daily.        . lansoprazole (PREVACID) 15 MG capsule Take 1 capsule (15 mg total) by mouth 2 (two) times daily.  90 capsule  3  . LORazepam (ATIVAN) 1 MG tablet Take 1 tablet (1 mg total) by mouth at bedtime as needed.  30 tablet  5  . losartan (COZAAR) 50 MG tablet Take 1/2 tablet by mouth once daily  45 tablet  3  . meloxicam (MOBIC) 7.5 MG tablet Take 7.5 mg by mouth daily. As needed       . simvastatin (ZOCOR) 20 MG tablet Take 1 tablet (20 mg total) by mouth at bedtime.  90 tablet  3  .  metoprolol succinate (TOPROL XL) 25 MG 24 hr tablet Take 1 tablet (25 mg total) by mouth daily.  90 tablet  3  . predniSONE (DELTASONE) 10 MG tablet Take 1 tab bid x 3 days, 1 daily x 3 days, 1/2 tablet daily until gone  12 tablet  0   Facility-Administered Medications Prior to Visit  Medication Dose Route Frequency Provider Last Rate Last Dose  . 0.9 %  sodium chloride infusion  500 mL Intravenous Continuous Iva Boop, MD       Allergies  Allergen Reactions  . Alendronate Sodium     REACTION: pt states INTOL to Fosamax w/ esophagitis  . Cefdinir     REACTION: diarrhea  . Esomeprazole Magnesium     REACTION: pt states INTOL to Nexium  . Levofloxacin     REACTION: diarrhea  . Omeprazole     REACTION: pt states INTOL to Prilosec  . Penicillins   . Shellfish-Derived Products Hives, Nausea Only and Rash      Patient Active Problem List    Diagnoses  . COLONIC POLYPS  . HYPERLIPIDEMIA  . ANEMIA  . ANXIETY  . HYPERTENSION  . CORONARY ARTERY DISEASE  . VENOUS INSUFFICIENCY  . UPPER RESPIRATORY INFECTION  . ALLERGIC RHINITIS  . HIATAL HERNIA  . DIVERTICULOSIS OF COLON  . RENAL CYST  . OSTEOARTHRITIS  . LOW BACK PAIN SYNDROME  . OSTEOPOROSIS  . HEADACHE  . COUGH  . GERD (gastroesophageal reflux disease)  . Dizziness     History   Social History  . Marital Status: Married    Spouse Name: Chrissie Noa    Number of Children: N/A  . Years of Education: N/A   Occupational History  . retired    Social History Main Topics  . Smoking status: Never Smoker   . Smokeless tobacco: Never Used  . Alcohol Use: 0.0 oz/week     occasionally  . Drug Use: No  . Sexually Active: Not on file   Other Topics Concern  . Not on file   Social History Narrative  . No narrative on file    Objective:   Physical Exam well-developed elderly white female in no acute distress, pleasant blood pressure 130/70 pulse 64 height 5 foot 6 weight 161. HEENT; nontraumatic normocephalic EOMI PERRLA sclera anicteric,Neck; Supple no JVD, Cardiovascular; regular rate and rhythm with S1-S2 no murmur or gallop, Pulmonary; clear bilaterally, Abdomen; soft, she has mild tenderness in the right and left lower quadrants and across the suprapubic area- no guarding, no rebound ,no palpable mass or hepatosplenomegaly bowel sounds are active, Rectal; not done, Extremities; no clubbing cyanosis or edema skin warm dry, Psych; mood and affect normal and appropriate.        Assessment & Plan:  #41 78 year old female with history of chronic GERD, adenomatous colon polyps and diverticular disease with complaints of one month of lower Donald discomfort cramping soreness and more frequent mucoid stools. Suspect she may have a mild smoldering diverticulitis, other possibility is IBS and/or bacterial overgrowth.  Plan; Align  one by mouth daily, samples given Add  Bentyl 10 mg by mouth twice daily as needed for cramping/diarrhea  Trial of Cipro 500 mg by mouth twice daily x10 days. Patient is asked to call after she completes the course of antibiotic, if her symptoms have not improved she will need further diagnostic workup.

## 2011-09-10 ENCOUNTER — Telehealth: Payer: Self-pay | Admitting: Pulmonary Disease

## 2011-09-10 MED ORDER — LOSARTAN POTASSIUM 50 MG PO TABS: ORAL_TABLET | ORAL | Status: DC

## 2011-09-10 MED ORDER — AMLODIPINE BESYLATE 10 MG PO TABS: 10.0000 mg | ORAL_TABLET | Freq: Every day | ORAL | Status: DC

## 2011-09-10 NOTE — Telephone Encounter (Signed)
Refills have been sent in to [facility] per pts request.  Called and spoke with pt and she is aware.   Nothing further needed.

## 2011-10-08 ENCOUNTER — Ambulatory Visit (INDEPENDENT_AMBULATORY_CARE_PROVIDER_SITE_OTHER): Payer: Medicare Other | Admitting: Pulmonary Disease

## 2011-10-08 ENCOUNTER — Encounter: Payer: Self-pay | Admitting: Pulmonary Disease

## 2011-10-08 ENCOUNTER — Other Ambulatory Visit (INDEPENDENT_AMBULATORY_CARE_PROVIDER_SITE_OTHER): Payer: Medicare Other

## 2011-10-08 VITALS — BP 122/84 | HR 77 | Temp 97.0°F | Ht 65.0 in | Wt 164.2 lb

## 2011-10-08 DIAGNOSIS — D126 Benign neoplasm of colon, unspecified: Secondary | ICD-10-CM

## 2011-10-08 DIAGNOSIS — K5730 Diverticulosis of large intestine without perforation or abscess without bleeding: Secondary | ICD-10-CM

## 2011-10-08 DIAGNOSIS — J309 Allergic rhinitis, unspecified: Secondary | ICD-10-CM

## 2011-10-08 DIAGNOSIS — E785 Hyperlipidemia, unspecified: Secondary | ICD-10-CM

## 2011-10-08 DIAGNOSIS — N281 Cyst of kidney, acquired: Secondary | ICD-10-CM

## 2011-10-08 DIAGNOSIS — M1990 Unspecified osteoarthritis, unspecified site: Secondary | ICD-10-CM

## 2011-10-08 DIAGNOSIS — M545 Low back pain: Secondary | ICD-10-CM

## 2011-10-08 DIAGNOSIS — I10 Essential (primary) hypertension: Secondary | ICD-10-CM

## 2011-10-08 DIAGNOSIS — K219 Gastro-esophageal reflux disease without esophagitis: Secondary | ICD-10-CM

## 2011-10-08 DIAGNOSIS — F411 Generalized anxiety disorder: Secondary | ICD-10-CM

## 2011-10-08 DIAGNOSIS — K449 Diaphragmatic hernia without obstruction or gangrene: Secondary | ICD-10-CM

## 2011-10-08 DIAGNOSIS — I2510 Atherosclerotic heart disease of native coronary artery without angina pectoris: Secondary | ICD-10-CM

## 2011-10-08 LAB — LIPID PANEL
HDL: 48.4 mg/dL (ref >39.00)
LDL Cholesterol: 111 mg/dL — ABNORMAL HIGH (ref 0–99)
Total CHOL/HDL Ratio: 4
Triglycerides: 151 mg/dL — ABNORMAL HIGH (ref 0.0–149.0)
VLDL: 30.2 mg/dL (ref 0.0–40.0)

## 2011-10-08 NOTE — Patient Instructions (Addendum)
Today we updated your med list in our EPIC system...    Continue your current medications the same...  Today we did your follow up Fasting lipid profile...    We will call you w/ the results...  For your abdominal symptoms>    Take the Bentyl (Dicyclomine) up to 4 times daily...    Use the PHAZYME anti-gas med (avil OTC) up to 4 times daily for the gas...  Keep up the good work w/ diet & exercise...  Call for any questions...  Let's plan another follow up visit in  6 month's time.Marland KitchenMarland Kitchen

## 2011-10-08 NOTE — Progress Notes (Signed)
Subjective:    Patient ID: Krystal [name], female    DOB: 10/30/1929, 78 y.o.   MRN: [id number]  HPI 78 y/o WF here for a follow up visit... she has multiple medical problems as noted below...   ~  May 14, 2009:  add-on w/ cough, congestion, & yellow sinus drainage... denies fever, no c/s, no CP or SOB, etc... also c/o legs aching "from the Simvastatin 20mg "- but it is tolerable & she had worse symptoms on mult prev statins in the past... BP controlled on meds;  cardiac f/u DrWeintraub- stable;  GI eval DrGessner for GERD & bloating (Pantoprazole & Bentyl help)...  ~  February 05, 2010:  she's had a good yr- no new complaints or concerns... saw DrWeintraub 6/11- stable, no angina, BP controlled, no changes made...   ~  November 11, 2010:  70mo ROV     She saw DrGessner 3/12> hx GERD, c/o intermittent dysphagia, & started on Prevacid 30mg /d; EGD 4/12 showed 3cmHH, otherw neg, no stricture found.    She's been c/o inner ear, balance off, dizzy for a month & saw DrBates last week; she reports that he thought it was "coming from my stomach" & gave her Flonase Rx; he also wanted her to stop the Prevacid due to her bones and suggested OTC Zantac ("he scared me"- she wants Rx for Zantac150mg  bid).    She saw DrWeintraub last week and had severe drainage & "he took pity on me" & gave her a Depo shot, sl improved but symptoms persist    We discussed Rx w/ Antihist in AM, spray Saline Q1-2H during the day, take Mucinex 1-2 Bid w/ Fluids and use the Flonase Qhs;  we also agreed to Rx acute symptoms w/ Sterapred dosepak...  ~  April 08, 2011:  34mo ROV & add-on for bronchitic symptoms> c/o cough, congestion, green sput, some wheezing x 3wks; notes assoc HA, aches/ pains, & low grade temp ~100; denies CP, SOB, etc; ZPak called in + Mucinex but no better; went to [facility] & given Pred, ?Augmentin, cough syrup; now c/o persistent deep cough esp at night;  We discussed further eval w/ CXR (?COPD, chr changes, NAD) &  LABS (wnl);  Rx w/ SeptraDS (she lists allergy to Levaquin), Tussionex, Mucinex + Fluids, & Depo80/ Pred 10mg - 3d taper...    >She saw DrESL 10/12 for recurrent sinus infections, frontal HA, PND, dry cough; he did allergy testing- ?results (not indicated in his note); rec to take saline wash, Claritin, Flonase, Patanase, Medrol 4mg  prolonged course...    >She saw ENT DrByers 10/12 w/ CT sinuses reported to be normal & nothing found, he rec allergy eval...    >She is followed by DrGessner & needs a much more vigorous antireflux regimen w/ her chr symptoms...  ~  October 08, 2011:  14mo ROV & Krystal [name] is stable w/o new complaints or concerns; she had some sinus congestion but the recent rain has helped...    She saw GI 6/13 w/ chr GERD controlled on Prevacid; hx colon polyps w/ colon 4/10 showing divertics & one sm adenoma removed; c/o abd pain, incr BMs & mucus in stool> treated w/ Cipro, Bentyl, Align and improved overall...    She saw the Urology PA 5/13> f/u renal cysts & she uses Estrace cream w/ improved lower tract symptoms; Sonar was essentially unchanged, she voided well, no signif PVR; no meds- rec f/u one yr...    We reviewed prob list, meds, xrays and labs>  see below for updates>> LABS 7/13:  FLP- no at goals on Simva20 w/ TG=151 LDL 111...          PROBLEM LIST:  ALLERGIC RHINITIS (ICD-477.9) - she uses OTC antihistamines + saline nasal spray Prn... "I have alot of mucous"- Rx w/ antihist, mucinex, cough syrups (wants Tussionex)... she had some left ear discomfort, dry skin & DrBates suggested mineral oil drop or two on occas Prn. ~  10/12:  She saw DrByers for ENT eval- neg w/ normal CT sinuses;  She saw DrESL for allergy eval & ?result of skin tests but not rec for allergy shots- given saline wash, Claritin, Flonase, Patanase, Medrol 4mg  prolonged course...  HYPERTENSION (ICD-401.9) - controlled on TOPROL XL 25mg /d, NORVASC 10mg /d, & LOSARTAN 50mg - 1/2 tab daily... ~  CXR 12/09 showed some  hyperaeration w/ flattening of diaphragms, NAD.Marland Kitchen. ~  8/12: BP= 136/78 here today & even better at home- feeling well, tolerating meds well; denies HA, fatigue, visual changes, CP, palipit, dizziness, syncope, dyspnea, edema, etc... ~  CXR 1/13 showed heart at the upper lim of norm, tortuous/ ectatic Ao, underlying COPD w/ mild basilar scarring, osteopenia w/ compression fxs, degen spondylosis... ~  1/13: BP= 128/64 & she is essent asymptomatic... ~  7/13:  BP= 122/84 & she denies CP, palpit, dizzy, SOB, edema, etc...  CORONARY ARTERY DISEASE (ICD-414.00) - followed by DrWeintraub & [facility]... on the above + ASA 81mg /d, & Statin for her Chol... she denies CP, palpit, change in SOB, edema, etc; she exercises by walking... ~  baseline EKG shows a chr LBBB... ~  cath 10/03 w/ single vessel CAD- 90%CIRC treated w/ PTCA/ stent... ~  Cardiolite 10/06 showed no ischemia... repeat 12/08 showed no change- no ischemia, +diaph attenuation, EF= 64% w/ LBBB... ~  2DEcho 12/08 showed mild conc LVH, mild calcif AoV, no stenosis, RVsys sl up at 40-50 mmHg... ~  2DEcho 4/10 showed borderline conc LVH, norm LVF w/ EF= 55%, mild LA dil, mod MR, RVsys=42... ~  8/12:  She tells me that she saw DrRW last week & he did LABS & ECHO ==> pending. ~  7/13:  We do not have any recent notes from [facility]...  VENOUS INSUFFICIENCY (ICD-459.81) - she knows to elim sodium, elevate legs, wear support hose (she is not on a diuretic)... she had VV surg in 2001 by [facility]... ~  She had Ven Dopplers per DrBlackman 3/13> neg for DVT  HYPERLIPIDEMIA (ICD-272.4) -  insurance company will not cover Crestor, only generics... she notes musc "cramps" on mult statins prev... now on SIMVASTATIN 20mg /d w/ some symptoms but tolerable... ~  FLP 8/09 on Crestor10 = TChol 154, Tg 132, HDL 41, LDL 86 (goal is <70 for secondary prevent). ~  FLP 9/10 on Prav20 showed TChol 211, TG 136, HDL 37, LDL 160... rec> try to incr Prav to 40mg /d> intol. ~  FLP 2/11  on Simva20 showed TChol 168, TG 123, HDL 50, LDL 94... continue same. ~  FLP 11/11 on Simva20 showed TChol 146, TG 120, HDL 38, LDL 84 ~  FLP 7/13 on Simva20 showed TChol 190, TG 151, HDL 48, LDL 111  HIATAL HERNIA (ICD-553.3) & GERD (ICD-530.81) - followed by DrGessner & rec to incr PREVACID to 30mg /d; but she says "DrBates scared me & says I shouldn't be taking the Prevacid" therefore she wants ZANTAC 150mg  Bid... ~  last EGD 1/98 showed 3-4cmHH, mod reflux esophagitis, mild duodenitis...  ~  12/10:  seen by GI w/ GERD &  bloating- Rx w/ Pantoprazole & Bentyl... ~  4/12: Hx GERD, c/o intermittent dysphagia, & EGD showed 3cmHH, otherw neg, no stricture found; rec to incr Prevacid to 30mg /d, but she wants top change to [facility] as noted above...  DIVERTICULOSIS OF COLON (ICD-562.10) & COLONIC POLYPS (ICD-211.3) - on BENTYL 20mg  Prn. ~  colonoscopy 6/05 by DrSam showed divertics and two 4-39mm polyps= adenomatous... ~  colonosco[py 5/10 by DrGessner showed divertics, hems, tiny cecal polyp= adenoma... f/u planned 78yrs.  RENAL CYST (ICD-593.2) - [facility]) and sonars have been stable... ~  She saw the Urology PA 5/13> f/u renal cysts & she uses Estrace cream w/ improved lower tract symptoms; Sonar was essentially unchanged, she voided well, no signif PVR; no meds- rec f/u one yr...  OSTEOARTHRITIS (ICD-715.90) - eval by Susann Givens 2/09 w/ stress fracture to 4th metatarsal left foot... she takes MOBIC 7.5mg  Prn for hand arthritis & prev saw DrMyerdierks for this... she notes that the Mobic helps her BP!  LOW BACK PAIN SYNDROME (ICD-724.2) - she states that her prev LBP has resolved and she is active w/ exercise 3-4 days per week... she had a partial T7 & T8 compression fractures noted in 2003... she took Forteo but only for 8 months per her GYN.  OSTEOPOROSIS (ICD-733.00) - as above>  this is managed and followed by her GYN Debbora Dus, NP) w/ BMD's and  currently on calcium and VitD 2000 daily... we do not have copies of her recent BMD's...  HEADACHE (ICD-784.0)  ANXIETY (ICD-300.00) - on LORAZEPAM 1mg - 1/2 to 1 tab three times a day as needed.  Hx of ANEMIA - hx of anemia in the past... Hg= 14.6 on 11/09/07... Hg= 14.4 on 04/08/11...  Health Maintenance - she had Pneumovax in the past w/ a reaction that she indicates lasted for 34yr... ~  Mammogram 5/13 was neg, & f/u rec in 77yr...   Past Surgical History  Procedure Date  . Abdominal hysterectomy   . Varicose vein surgery   . Bladder repair   . Colonoscopy 08/17/2008    adenomatous polyp, diverticulosis, external hemorrhoids  . Cataract extraction     bilateral  . Upper gastrointestinal endoscopy 07/03/2010    hiatal hernia     Outpatient Encounter Prescriptions as of 10/08/2011  Medication Sig Dispense Refill  . amLODipine (NORVASC) 10 MG tablet Take 1 tablet (10 mg total) by mouth daily.  90 tablet  3  . aspirin 81 MG tablet Take 81 mg by mouth daily.        Marland Kitchen azelastine (ASTELIN) 137 MCG/SPRAY nasal spray Use 1-2 sprays in each nostril two times daily as needed  30 mL  5  . buPROPion (WELLBUTRIN SR) 100 MG 12 hr tablet Take 1 tablet (100 mg total) by mouth daily.  90 tablet  3  . Calcium Carbonate-Vitamin D 600-400 MG-UNIT per tablet Take 1 tablet by mouth 2 (two) times daily.        . chlorpheniramine-HYDROcodone (TUSSIONEX PENNKINETIC ER) 10-8 MG/5ML LQCR Take 5 mLs by mouth every 12 (twelve) hours.  140 mL  2  . Cholecalciferol (VITAMIN D3) 2000 UNITS TABS Take 2,000 Units by mouth daily.        Marland Kitchen dicyclomine (BENTYL) 10 MG capsule Take 1 tab twice daily as needed for cramping and spasms.  60 capsule  2  . fluticasone (FLONASE) 50 MCG/ACT nasal spray Place 2 sprays into the nose daily.        Marland Kitchen  lansoprazole (PREVACID) 15 MG capsule Take 1 capsule (15 mg total) by mouth 2 (two) times daily.  90 capsule  3  . loratadine (CLARITIN) 10 MG tablet Take 10 mg by mouth daily.      Marland Kitchen  LORazepam (ATIVAN) 1 MG tablet Take 1 tablet (1 mg total) by mouth at bedtime as needed.  30 tablet  5  . losartan (COZAAR) 50 MG tablet Take 1/2 tablet by mouth once daily  45 tablet  3  . meloxicam (MOBIC) 7.5 MG tablet Take 7.5 mg by mouth daily. As needed       . metoprolol succinate (TOPROL XL) 25 MG 24 hr tablet Take 1 tablet (25 mg total) by mouth daily.  90 tablet  3  . simvastatin (ZOCOR) 20 MG tablet Take 1 tablet (20 mg total) by mouth at bedtime.  90 tablet  3  . DISCONTD: cetirizine (ZYRTEC) 10 MG tablet Take 10 mg by mouth daily as needed.        Marland Kitchen DISCONTD: dicyclomine (BENTYL) 10 MG capsule Take 10 mg by mouth. 1 every 6 hours daily      . DISCONTD: predniSONE (DELTASONE) 10 MG tablet Take 1 tab bid x 3 days, 1 daily x 3 days, 1/2 tablet daily until gone  12 tablet  0   Facility-Administered Encounter Medications as of 10/08/2011  Medication Dose Route Frequency Provider Last Rate Last Dose  . 0.9 %  sodium chloride infusion  500 mL Intravenous Continuous Iva Boop, MD        Allergies  Allergen Reactions  . Alendronate Sodium     REACTION: pt states INTOL to Fosamax w/ esophagitis  . Cefdinir     REACTION: diarrhea  . Esomeprazole Magnesium     REACTION: pt states INTOL to Nexium  . Levofloxacin     REACTION: diarrhea  . Omeprazole     REACTION: pt states INTOL to Prilosec  . Penicillins   . Shellfish-Derived Products Hives, Nausea Only and Rash    Current Medications, Allergies, Past Medical History, Past Surgical History, Family History, and Social History were reviewed in [employer] record.    Review of Systems      See HPI - all other systems neg except as noted...      The patient denies anorexia, fever, weight loss, weight gain, vision loss, decreased hearing, hoarseness, chest pain, syncope, dyspnea on exertion, peripheral edema, prolonged cough, headaches, hemoptysis, abdominal pain, melena, hematochezia, severe  indigestion/heartburn, hematuria, incontinence, muscle weakness, suspicious skin lesions, transient blindness, difficulty walking, depression, unusual weight change, abnormal bleeding, enlarged lymph nodes, and angioedema.     Objective:   Physical Exam    WD, WN, 78 y/o WF in NAD... GENERAL:  Alert & oriented; pleasant & cooperative... HEENT:  Fowler/AT, EOM-wnl, PERRLA, EACs-clear, TMs-wnl, NOSE-sl drainage, THROAT-clear & wnl. NECK:  Supple w/ fairROM; no JVD; normal carotid impulses w/o bruits; no thyromegaly or nodules palpated; no lymphadenopathy. CHEST:  Clear to P & A; without wheezes/ rales/ or rhonchi heard... HEART:  Regular Rhythm; gr 1/6 SEM at LSB, no rubs, no gallops apprec... ABDOMEN:  Soft & nontender; normal bowel sounds; no organomegaly or masses detected. EXT: without deformities, mild arthritic changes; hx varicose veins/ +venous insuffic/ no edema. no muscle tenderness etc... NEURO:  CN's intact; no focal neuro deficits... DERM:  No lesions noted; no rash etc...  RADIOLOGY DATA:  Reviewed in the EPIC EMR & discussed w/ the patient...  LABORATORY DATA:  Reviewed  in the [facility] EMR & discussed w/ the patient...   Assessment & Plan:   AR, Chronic Sinus Problems>  We reviewed regimen w/ Antihist Qam, Saline nasal mist every 1-2H during the day, Mucinex 2 tabsBid, & Flonase Qhs... She has now had both ENT & Allergy evals...  HBP>  Controlled on meds, continue same...  CAD>  Followed by DrWeintraub on meds listed; seen recently & he did labs according to the pt; copies pending.  Ven Insuffic>  She had VV surg in 2001; she knows to elim salt, elevate, wear support hose...  HYPERLIPID>  Stable on Simva20, recent labs per DrRW pending...  GI> HH, GERD, Divertics, Polyps>  Wants change to H2Blockers eg- ZANTAC Rx...  DJD, LBP>  Evaluated by Susann Givens in past; prev hx partial T7-T8 compression...  Osteoporosis>  Managed by her GYN; on Calcium, MVI, Vit D; w/ the above  compression she took Forteo in past but only for 8months...  Anxiety/ Depression>  On Wellbutrin for depression & requests refill;  On Lorazepam for nerves as well...   Patient's Medications  New Prescriptions   METRONIDAZOLE (FLAGYL) 250 MG TABLET    Take 1 tablet (250 mg total) by mouth 3 (three) times daily.  Previous Medications   AMLODIPINE (NORVASC) 10 MG TABLET    Take 1 tablet (10 mg total) by mouth daily.   ASPIRIN 81 MG TABLET    Take 81 mg by mouth daily.     AZELASTINE (ASTELIN) 137 MCG/SPRAY NASAL SPRAY    Use 1-2 sprays in each nostril two times daily as needed   BUPROPION (WELLBUTRIN SR) 100 MG 12 HR TABLET    Take 1 tablet (100 mg total) by mouth daily.   CALCIUM CARBONATE-VITAMIN D 600-400 MG-UNIT PER TABLET    Take 1 tablet by mouth 2 (two) times daily.     CHLORPHENIRAMINE-HYDROCODONE (TUSSIONEX PENNKINETIC ER) 10-8 MG/5ML LQCR    Take 5 mLs by mouth every 12 (twelve) hours.   CHOLECALCIFEROL (VITAMIN D3) 2000 UNITS TABS    Take 2,000 Units by mouth daily.     DICYCLOMINE (BENTYL) 10 MG CAPSULE    Take 1 tab twice daily as needed for cramping and spasms.   FLUTICASONE (FLONASE) 50 MCG/ACT NASAL SPRAY    Place 2 sprays into the nose daily.     LANSOPRAZOLE (PREVACID) 15 MG CAPSULE    Take 1 capsule (15 mg total) by mouth 2 (two) times daily.   LORATADINE (CLARITIN) 10 MG TABLET    Take 10 mg by mouth daily.   LORAZEPAM (ATIVAN) 1 MG TABLET    Take 1 tablet (1 mg total) by mouth at bedtime as needed.   LOSARTAN (COZAAR) 50 MG TABLET    Take 1/2 tablet by mouth once daily   MELOXICAM (MOBIC) 7.5 MG TABLET    Take 7.5 mg by mouth daily. As needed    METOPROLOL SUCCINATE (TOPROL XL) 25 MG 24 HR TABLET    Take 1 tablet (25 mg total) by mouth daily.   PROBIOTIC PRODUCT (ALIGN) 4 MG CAPS    Take 1 capsule by mouth daily.   SIMETHICONE (MYLICON) 125 MG CHEWABLE TABLET    Chew 125 mg by mouth every 6 (six) hours as needed.   SIMVASTATIN (ZOCOR) 20 MG TABLET    Take 1 tablet (20 mg  total) by mouth at bedtime.  Modified Medications   No medications on file  Discontinued Medications   CETIRIZINE (ZYRTEC) 10 MG TABLET    Take 10  mg by mouth daily as needed.     DICYCLOMINE (BENTYL) 10 MG CAPSULE    Take 10 mg by mouth. 1 every 6 hours daily   PREDNISONE (DELTASONE) 10 MG TABLET    Take 1 tab bid x 3 days, 1 daily x 3 days, 1/2 tablet daily until gone

## 2011-10-10 ENCOUNTER — Telehealth: Payer: Self-pay | Admitting: Internal Medicine

## 2011-10-10 NOTE — Telephone Encounter (Signed)
Patient c/o continued LLQ pain, gas and bloating and mucous in her stool.  She was treated in early May for ? Diverticulitis with Cipro.  Her symptoms have never completely resolved.  She will come in on Monday and see Dr Leone Payor on 10/13/11

## 2011-10-13 ENCOUNTER — Encounter: Payer: Self-pay | Admitting: Internal Medicine

## 2011-10-13 ENCOUNTER — Ambulatory Visit (INDEPENDENT_AMBULATORY_CARE_PROVIDER_SITE_OTHER): Payer: Medicare Other | Admitting: Internal Medicine

## 2011-10-13 VITALS — BP 148/70 | HR 83 | Ht 65.0 in | Wt 162.0 lb

## 2011-10-13 DIAGNOSIS — R1030 Lower abdominal pain, unspecified: Secondary | ICD-10-CM

## 2011-10-13 DIAGNOSIS — K589 Irritable bowel syndrome without diarrhea: Secondary | ICD-10-CM

## 2011-10-13 DIAGNOSIS — R195 Other fecal abnormalities: Secondary | ICD-10-CM

## 2011-10-13 DIAGNOSIS — R109 Unspecified abdominal pain: Secondary | ICD-10-CM

## 2011-10-13 HISTORY — DX: Irritable bowel syndrome, unspecified: K58.9

## 2011-10-13 NOTE — Patient Instructions (Addendum)
Dr. Stan Head will be in touch and if you haven't heard from [place] in a week call us.   Thank you for choosing me and [facility].

## 2011-10-13 NOTE — Progress Notes (Signed)
  Subjective:    Patient ID: Krystal [name], female    DOB: 1930/01/14, 78 y.o.   MRN: [id number]  HPI The patient is an elderly white woman who presents for followup after an appointment with the physician assistant regarding abdominal pain and loose stools. She was prescribed align and Cipro. She had some partial benefit while on Cipro but essentially is about the same at this point, 6 weeks after that visit. She has loose gassy stools are somewhat explosive, especially upon arising in the morning. She may have some normal stools as well. Moving her bowels several times a day but not having nocturnal symptoms. Crampy bilateral lower quadrant, pain occurs, dicyclomine provide some relief for that. These symptoms have been chronic, recurrent though intermittent over the years it sounds like. She recall taking a certain antibiotic but is unable or the name, many years ago, prescribed by Dr. Corinda [name]. She is convinced that that will provide relief and would like me to look for that in the records.  She denies fever, chills or rectal bleeding.  Medications, allergies, past medical history, past surgical history, family history and social history are reviewed and updated in the EMR.   Review of Systems As per history of present illness    Objective:   Physical Exam General:  NAD Lungs:  clear Abdomen:  soft and nontender, BS+     Data Reviewed:  GI  office visits for the last 10 years, in the paper chart.     Assessment & Plan:   1. Loose stools   2. Lower abdominal pain   3. IBS (irritable bowel syndrome)    1. I will check the records, request the paper chart to see if I can determine what antibiotics she took in the past. If unable to do so, Xifaxan might be a good choice as it sounds like she has bacterial overgrowth associated with IBS.- This was done subsequent to her leaving the off this, in 2005 he gave her a course of metronidazole. I will ask her if she thinks that was what was  and we can try that again. 2. Consider pancreatic enzyme supplements as an alternative therapy 3. Lansoprazole controls her GERD symptoms quite well. It has been associated with loose watery diarrhea at times and we'll need to keep this in mind that she's been on that for years and these are intermittent episodic symptoms.

## 2011-10-14 ENCOUNTER — Telehealth: Payer: Self-pay

## 2011-10-14 MED ORDER — METRONIDAZOLE 250 MG PO TABS: 250.0000 mg | ORAL_TABLET | Freq: Three times a day (TID) | ORAL | Status: AC

## 2011-10-14 NOTE — Telephone Encounter (Signed)
Message copied by [place], Titus Drone E on Tue Oct 14, 2011 11:55 AM ------      Message from: Stan Head E      Created: Mon Oct 13, 2011  5:20 PM      Regarding: prior antibiotic       Ask her if it was metronidazole (Flagyl) that she was given by Dr. Corinda Gubler years ago - I found that he prescribed that in 2005            If so            Rx metronidazole 250 mg tid x 10 days and she can call us back with results of treatment

## 2011-10-14 NOTE — Telephone Encounter (Signed)
Pt informed that Dr. Doreatha Martin had given her this years ago, she is still unsure of the name.  She willing to try the Flagyl.  Sent to [facility].

## 2011-10-24 ENCOUNTER — Telehealth: Payer: Self-pay | Admitting: Internal Medicine

## 2011-10-24 NOTE — Telephone Encounter (Signed)
Patient calling to report she felt better after taking the Flagyl. Reports she did not feel as well on Wednesday but she thinks she picked up a stomach bug. She is doing better now, just a little nausea. She is eating a bland diet. She will call back if this continues next week

## 2011-10-24 NOTE — Telephone Encounter (Signed)
Patient has finished her antibiotics and was told to call with how she is doing.

## 2011-10-30 ENCOUNTER — Other Ambulatory Visit: Payer: Self-pay | Admitting: *Deleted

## 2011-10-30 MED ORDER — LORAZEPAM 1 MG PO TABS: 1.0000 mg | ORAL_TABLET | Freq: Every evening | ORAL | Status: DC | PRN

## 2011-12-08 ENCOUNTER — Telehealth: Payer: Self-pay | Admitting: Pulmonary Disease

## 2011-12-08 MED ORDER — METOPROLOL SUCCINATE ER 25 MG PO TB24: 25.0000 mg | ORAL_TABLET | Freq: Every day | ORAL | Status: DC

## 2011-12-08 NOTE — Telephone Encounter (Signed)
Pt notified that refill for Toprol was sent to [facility].

## 2011-12-18 ENCOUNTER — Telehealth: Payer: Self-pay | Admitting: Pulmonary Disease

## 2011-12-18 MED ORDER — SULFAMETHOXAZOLE-TRIMETHOPRIM 800-160 MG PO TABS: 1.0000 | ORAL_TABLET | Freq: Two times a day (BID) | ORAL | Status: DC

## 2011-12-18 NOTE — Telephone Encounter (Signed)
lmomtcb x1 for pt rx has been sent 

## 2011-12-18 NOTE — Telephone Encounter (Signed)
Per SN---ok to call in bactrim ds  #14  1 po bid until gone.  thanks

## 2011-12-18 NOTE — Telephone Encounter (Signed)
Pt returned triage's call.  Advised SN called in Rx for bactrim ds #14 - 1 tab by mouth 2 (two) times daily-take until finished/[facility] [address]  Pt verbalized understanding & stated nothing further needed at this time.  Antionette Fairy

## 2011-12-18 NOTE — Telephone Encounter (Signed)
I spoke with pt and she c/o slight wheezing, sneezing, PND, cough w/ clear phlem, HA, sinus pressure, nasal congestion, blowing out clear phlem x this week. Denies any f/c/s/n/v.  She has been taking mucinex daily. Pt does not want prednisone called in but she does want Bactrim DS. Please advise SN thanks  Allergies  Allergen Reactions  . Alendronate Sodium     REACTION: pt states INTOL to Fosamax w/ esophagitis  . Cefdinir     REACTION: diarrhea  . Esomeprazole Magnesium     REACTION: pt states INTOL to Nexium  . Levofloxacin     REACTION: diarrhea  . Omeprazole     REACTION: pt states INTOL to Prilosec  . Penicillins   . Shellfish-Derived Products Hives, Nausea Only and Rash

## 2012-01-22 ENCOUNTER — Ambulatory Visit (INDEPENDENT_AMBULATORY_CARE_PROVIDER_SITE_OTHER): Payer: Medicare Other

## 2012-01-22 DIAGNOSIS — Z23 Encounter for immunization: Secondary | ICD-10-CM

## 2012-03-02 ENCOUNTER — Telehealth: Payer: Self-pay | Admitting: Pulmonary Disease

## 2012-03-02 MED ORDER — AZITHROMYCIN 250 MG PO TABS: ORAL_TABLET | ORAL | Status: DC

## 2012-03-02 MED ORDER — METHYLPREDNISOLONE 4 MG PO KIT: PACK | ORAL | Status: DC

## 2012-03-02 NOTE — Telephone Encounter (Signed)
Per SN---ok to call in zpak #1  Take as directed with no refills and medrol dosepak  #1  Take as directed.  thanks

## 2012-03-02 NOTE — Telephone Encounter (Signed)
Pt called back and added that she had a shot for her bones (PROLIA) in Oct. Krystal [name]

## 2012-03-02 NOTE — Telephone Encounter (Signed)
i spoke with pt and is aware of SN recs. She voiced her understanding and needed nothing further. rx's have been sent to the pharmacy

## 2012-03-02 NOTE — Telephone Encounter (Signed)
I spoke with pt and she c/o cough w/ slight yellow phlem, PND, nasal congestion, facial pressure x 3 weeks off and on. No wheezing, chest tx, f/c/s/n/v. She has been using saline nasal psray and mucinex. She would like something called in. Please advise thanks Last OV 10/08/11 Pending 04/13/12  Allergies  Allergen Reactions  . Alendronate Sodium     REACTION: pt states INTOL to Fosamax w/ esophagitis  . Cefdinir     REACTION: diarrhea  . Esomeprazole Magnesium     REACTION: pt states INTOL to Nexium  . Levofloxacin     REACTION: diarrhea  . Omeprazole     REACTION: pt states INTOL to Prilosec  . Penicillins   . Shellfish-Derived Products Hives, Nausea Only and Rash

## 2012-03-16 ENCOUNTER — Other Ambulatory Visit: Payer: Self-pay | Admitting: Pulmonary Disease

## 2012-04-09 ENCOUNTER — Ambulatory Visit: Payer: Medicare Other | Admitting: Pulmonary Disease

## 2012-04-13 ENCOUNTER — Encounter: Payer: Self-pay | Admitting: Pulmonary Disease

## 2012-04-13 ENCOUNTER — Other Ambulatory Visit (INDEPENDENT_AMBULATORY_CARE_PROVIDER_SITE_OTHER): Payer: Medicare Other

## 2012-04-13 ENCOUNTER — Ambulatory Visit (INDEPENDENT_AMBULATORY_CARE_PROVIDER_SITE_OTHER): Payer: Medicare Other | Admitting: Pulmonary Disease

## 2012-04-13 VITALS — BP 132/70 | HR 78 | Temp 97.3°F | Ht 65.0 in | Wt 165.6 lb

## 2012-04-13 DIAGNOSIS — M1990 Unspecified osteoarthritis, unspecified site: Secondary | ICD-10-CM

## 2012-04-13 DIAGNOSIS — I10 Essential (primary) hypertension: Secondary | ICD-10-CM

## 2012-04-13 DIAGNOSIS — F411 Generalized anxiety disorder: Secondary | ICD-10-CM

## 2012-04-13 DIAGNOSIS — E785 Hyperlipidemia, unspecified: Secondary | ICD-10-CM

## 2012-04-13 DIAGNOSIS — I2510 Atherosclerotic heart disease of native coronary artery without angina pectoris: Secondary | ICD-10-CM

## 2012-04-13 DIAGNOSIS — K5730 Diverticulosis of large intestine without perforation or abscess without bleeding: Secondary | ICD-10-CM

## 2012-04-13 DIAGNOSIS — M810 Age-related osteoporosis without current pathological fracture: Secondary | ICD-10-CM

## 2012-04-13 DIAGNOSIS — K589 Irritable bowel syndrome without diarrhea: Secondary | ICD-10-CM

## 2012-04-13 DIAGNOSIS — K219 Gastro-esophageal reflux disease without esophagitis: Secondary | ICD-10-CM

## 2012-04-13 DIAGNOSIS — I872 Venous insufficiency (chronic) (peripheral): Secondary | ICD-10-CM

## 2012-04-13 DIAGNOSIS — M545 Low back pain: Secondary | ICD-10-CM

## 2012-04-13 DIAGNOSIS — J309 Allergic rhinitis, unspecified: Secondary | ICD-10-CM

## 2012-04-13 LAB — BASIC METABOLIC PANEL
BUN: 17 mg/dL (ref 6–23)
CO2: 26 mEq/L (ref 19–32)
Chloride: 105 mEq/L (ref 96–112)
GFR: 65.68 mL/min (ref >60.00)
Glucose, Bld: 107 mg/dL — ABNORMAL HIGH (ref 70–99)
Potassium: 4.6 mEq/L (ref 3.5–5.1)
Sodium: 139 mEq/L (ref 135–145)

## 2012-04-13 LAB — CBC WITH DIFFERENTIAL/PLATELET
Basophils Absolute: 0 10*3/uL (ref 0.0–0.1)
Eosinophils Relative: 1.2 % (ref 0.0–5.0)
HCT: 44.5 % (ref 36.0–46.0)
Lymphs Abs: 2.5 10*3/uL (ref 0.7–4.0)
MCV: 93.3 fl (ref 78.0–100.0)
Monocytes Absolute: 0.6 10*3/uL (ref 0.1–1.0)
Monocytes Relative: 6.7 % (ref 3.0–12.0)
Neutrophils Relative %: 64.4 % (ref 43.0–77.0)
Platelets: 232 10*3/uL (ref 150.0–400.0)
RDW: 14.5 % (ref 11.5–14.6)
WBC: 9 10*3/uL (ref 4.5–10.5)

## 2012-04-13 LAB — HEPATIC FUNCTION PANEL
ALT: 22 U/L (ref 0–35)
AST: 26 U/L (ref 0–37)
Albumin: 4.3 g/dL (ref 3.5–5.2)
Alkaline Phosphatase: 78 U/L (ref 39–117)

## 2012-04-13 LAB — TSH: TSH: 0.94 u[IU]/mL (ref 0.35–5.50)

## 2012-04-13 LAB — LIPID PANEL: Cholesterol: 161 mg/dL (ref 0–200)

## 2012-04-13 MED ORDER — MELOXICAM 7.5 MG PO TABS: 7.5000 mg | ORAL_TABLET | Freq: Every day | ORAL | Status: DC

## 2012-04-13 MED ORDER — LOSARTAN POTASSIUM 50 MG PO TABS: 50.0000 mg | ORAL_TABLET | Freq: Every day | ORAL | Status: DC

## 2012-04-13 NOTE — Patient Instructions (Addendum)
Today we updated your med list in our EPIC system...    Continue your current medications the same...    We refilled the meds you requested...  Today we did your follow up FASTING blood work...    We will contact you w/ the results when avail...  We wrote a prescription for the SHINGLES vaccine should you decide to get it...  Call for any questiopns or if we can be of service in any way...  Let's plan a similar follow up visit in 6 months.Marland KitchenMarland Kitchen

## 2012-04-13 NOTE — Progress Notes (Signed)
Subjective:    Patient ID: Krystal [name], female    DOB: [birth year], 79 y.o.   MRN: [id number]  HPI 79 y/o WF here for a follow up visit... she has multiple medical problems as noted below...   ~  April 08, 2011:  35mo ROV & add-on for bronchitic symptoms> c/o cough, congestion, green sput, some wheezing x 3wks; notes assoc HA, aches/ pains, & low grade temp ~100; denies CP, SOB, etc; ZPak called in + Mucinex but no better; went to [facility] & given Pred, ?Augmentin, cough syrup; now c/o persistent deep cough esp at night;  We discussed further eval w/ CXR (?COPD, chr changes, NAD) & LABS (wnl);  Rx w/ SeptraDS (she lists allergy to Levaquin), Tussionex, Mucinex + Fluids, & Depo80/ Pred 10mg - 3d taper...    >She saw Krystal [name] 10/12 for recurrent sinus infections, frontal HA, PND, dry cough; he did allergy testing- ?results (not indicated in his note); rec to take saline wash, Claritin, Flonase, Patanase, Medrol 4mg  prolonged course...    >She saw ENT Krystal [name] 10/12 w/ CT sinuses reported to be normal & nothing found, he rec allergy eval...    >She is followed by Krystal [name] & needs a much more vigorous antireflux regimen w/ her chr symptoms...  ~  October 08, 2011:  682mo ROV & Astra is stable w/o new complaints or concerns; she had some sinus congestion but the recent rain has helped...    She saw GI 6/13 w/ chr GERD controlled on Prevacid; hx colon polyps w/ colon 4/10 showing divertics & one sm adenoma removed; c/o abd pain, incr BMs & mucus in stool> treated w/ Cipro, Bentyl, Align and improved overall...    She saw the Urology PA 5/13> f/u renal cysts & she uses Estrace cream w/ improved lower tract symptoms; Sonar was essentially unchanged, she voided well, no signif PVR; no meds- rec f/u one yr...    We reviewed prob list, meds, xrays and labs> see below for updates>> LABS 7/13:  FLP- no at goals on Simva20 w/ TG=151 LDL 111...  ~  April 13, 2012:  682mo ROV & Krystal [name] persists w/ complaints of sinus  drainage, some cough, etc; she's been using the Astelin, Saline, & Zyrtek (hx intol to Allegra) and has seen Allergist & ENT in the past; offered change to [facility] but she felt everything is about stable & content to continue same Rx... We reviewed the following medical problems during today's office visit >> she did not bring her med bottles to review >>    HBP> on MetopER25, Amlod10, Losar50; BP= 132/70 & she denies CP, palpit, dizzy, SOB, edema...    CAD> on ASA81; followed by Krystal [name]- seen 8/13 & stable w/o angina, he increased her Simva20=>40mg /d...    VenInsuffic> on low sodium diet, not requiring diuretics; she knows to elev legs, wear support hose prn...    Hyperlipid> on Simva40 now; FLP on this dose shows TChol 161, TG 133, HDL 44, LDL 91    GI- GERD, Divertics, IBS, Polyps> on Prev15Bid, Bentyl20mg  prn, Align daily; states she is doing well w/o abd pain, n/v, c/d, blood seen...    DJD, LBP> on Mobic7.5 prn; all symptoms improved & exercising daily...     Osteoporosis> followed & managed by Gyn- part T7&8 compression fx, prev Rx w/ Forteo, now on Calcium, MVI, VitD; she reports that Krystal [name] treated her w/ Prolia but ?she states she only takes it when she needs it?    Anxiety> on Ativan 1mg -  takes it Qhs for sleep... We reviewed prob list, meds, xrays and labs> see below for updates >> she had the Flu shot in Oct 7 Rx written for Shingles vaccine should she decide to get it... LABS 1/14:  FLP- at goals on Simva40;  Chems- wnl;  CBC- wnl;  TSH=0.94;  VitD=41 on 2000u/d...          PROBLEM LIST:  ALLERGIC RHINITIS (ICD-477.9) - she uses OTC antihistamines + saline nasal spray Prn... "I have alot of mucous"- Rx w/ antihist, mucinex, cough syrups (wants Tussionex)... she had some left ear discomfort, dry skin & Krystal [name] suggested mineral oil drop or two on occas Prn. ~  10/12:  She saw Krystal [name] for ENT eval- neg w/ normal CT sinuses;  She saw Krystal [name] for allergy eval & ?result of skin tests but not  rec for allergy shots- given saline wash, Claritin, Flonase, Patanase, Medrol 4mg  prolonged course... ~  We discussed Rx w/ Antihist in AM, spray Saline Q1-2H during the day, take Mucinex 1-2 Bid w/ Fluids and use the Flonase Qhs;  we also agreed to Rx acute symptoms w/ Sterapred dosepak...  HYPERTENSION (ICD-401.9) - controlled on TOPROL XL 25mg /d, NORVASC 10mg /d, & LOSARTAN 50/d... ~  CXR 12/09 showed some hyperaeration w/ flattening of diaphragms, NAD.Marland Kitchen. ~  8/12: BP= 136/78 here today & even better at home- feeling well, tolerating meds well; denies HA, fatigue, visual changes, CP, palipit, dizziness, syncope, dyspnea, edema, etc... ~  CXR 1/13 showed heart at the upper lim of norm, tortuous/ ectatic Ao, underlying COPD w/ mild basilar scarring, osteopenia w/ compression fxs, degen spondylosis... ~  1/13: BP= 128/64 & she is essent asymptomatic... ~  7/13:  BP= 122/84 & she denies CP, palpit, dizzy, SOB, edema, etc... ~  1/14: on MetopER25, Amlod10, Losar50; BP= 132/70 & she denies CP, palpit, dizzy, SOB, edema...  CORONARY ARTERY DISEASE (ICD-414.00) - followed by Krystal [name] & [facility]... on the above + ASA 81mg /d, & Statin for her Chol... she denies CP, palpit, change in SOB, edema, etc; she exercises by walking... ~  baseline EKG shows a chr LBBB... ~  cath 10/03 w/ single vessel CAD- 90%CIRC treated w/ PTCA/ stent... ~  Cardiolite 10/06 showed no ischemia... repeat 12/08 showed no change- no ischemia, +diaph attenuation, EF= 64% w/ LBBB... ~  2DEcho 12/08 showed mild conc LVH, mild calcif AoV, no stenosis, RVsys sl up at 40-50 mmHg... ~  2DEcho 4/10 showed borderline conc LVH, norm LVF w/ EF= 55%, mild LA dil, mod MR, RVsys=42... ~  8/12:  She tells me that she saw Krystal [name] last week & he did LABS & ECHO ==> pending. ~  8/13:  She saw Krystal [name] for f/u HBP, CAD, LBBB, Hyperlipid; cath in 2003 w/ 90%LAD w/ stenting & doing well w/o angina; Myoview 2008 w/o ischemia; 2DEcho 8/12 w/ mild conc LVH, no valv  dis, norm LVF, & mild DD... He increased her Simva20=>40mg /d...  VENOUS INSUFFICIENCY (ICD-459.81) - she knows to elim sodium, elevate legs, wear support hose (she is not on a diuretic)... she had [facility] surg in [facility] by [facility]... ~  She had Ven Dopplers per DrBlackman 3/13> neg for DVT  HYPERLIPIDEMIA (ICD-272.4) -  insurance company will not cover Crestor, only generics... she notes musc "cramps" on mult statins prev... now on SIMVASTATIN 20mg /d w/ some symptoms but tolerable... ~  FLP 8/09 on Crestor10 = TChol 154, Tg 132, HDL 41, LDL 86 (goal is <70 for secondary prevent). ~  FLP 9/10 on Prav20  showed TChol 211, TG 136, HDL 37, LDL 160... rec> try to incr Prav to 40mg /d> intol. ~  FLP 2/11 on Simva20 showed TChol 168, TG 123, HDL 50, LDL 94... continue same. ~  FLP 11/11 on Simva20 showed TChol 146, TG 120, HDL 38, LDL 84 ~  FLP 7/13 on Simva20 showed TChol 190, TG 151, HDL 48, LDL 111 ~  FLP 1/14 on simva40 showed TChol 161, TG 133, HDL 44, LDL 91   HIATAL HERNIA (ICD-553.3) & GERD (ICD-530.81) - followed by Krystal [name] & rec to incr PREVACID to 30mg /d; but she says "Krystal [name] scared me & says I shouldn't be taking the Prevacid" therefore she wants ZANTAC 150mg  Bid... ~  last EGD 1/98 showed 3-4cmHH, mod reflux esophagitis, mild duodenitis...  ~  12/10:  seen by GI w/ GERD & bloating- Rx w/ Pantoprazole & Bentyl... ~  4/12: Hx GERD, c/o intermittent dysphagia, & EGD showed 3cmHH, otherw neg, no stricture found; rec to incr Prevacid to 30mg /d, but she wants top change to [facility] as noted above... ~  1/14: on Prev15Bid, Bentyl20mg  prn, Align daily; states she is doing well w/o abd pain, n/v, c/d, blood seen.   DIVERTICULOSIS OF COLON (ICD-562.10) & COLONIC POLYPS (ICD-211.3) - on BENTYL 20mg  Prn. ~  colonoscopy 6/05 by DrSam showed divertics and two 4-63mm polyps= adenomatous... ~  colonosco[py 5/10 by Krystal [name] showed divertics, hems, tiny cecal polyp= adenoma... f/u planned 71yrs.  RENAL CYST  (ICD-593.2) - [facility], [facility]) and sonars have been stable... ~  She saw the Urology PA 5/13> f/u renal cysts & she uses Estrace cream w/ improved lower tract symptoms; Sonar was essentially unchanged, she voided well, no signif PVR; no meds- rec f/u one yr...  OSTEOARTHRITIS (ICD-715.90) - eval by Susann Givens 2/09 w/ stress fracture to 4th metatarsal left foot... she takes MOBIC 7.5mg  Prn for hand arthritis & prev saw DrMyerdierks for this... she notes that the Mobic helps her BP!  LOW BACK PAIN SYNDROME (ICD-724.2) - she states that her prev LBP has resolved and she is active w/ exercise 3-4 days per week... she had a partial T7 & T8 compression fractures noted in 2003... she took Forteo but only for 8 months per her GYN.  OSTEOPOROSIS (ICD-733.00) - as above>  this is managed and followed by her GYN Debbora Dus, NP) w/ BMD's and currently on calcium and VitD 2000 daily... we do not have copies of her recent BMD's... ~  1/14: followed & managed by Gyn- part T7&8 compression fx, prev Rx w/ Forteo, now on Calcium, MVI, VitD; she reports that Krystal [name] treated her w/ Prolia but ?she states she only takes it when she needs it?   HEADACHE (ICD-784.0)  ANXIETY (ICD-300.00) - on LORAZEPAM 1mg - 1/2 to 1 tab three times a day as needed.  Hx of ANEMIA - hx of anemia in the past... Hg= 14.6 on 11/09/07... Hg= 14.4 on 04/08/11...  Health Maintenance - she had Pneumovax in the past w/ a reaction that she indicates lasted for 36yr... ~  Mammogram 5/13 was neg, & f/u rec in 41yr...   Past Surgical History  Procedure Date  . Abdominal hysterectomy   . Varicose vein surgery   . Bladder repair   . Colonoscopy 08/17/2008    adenomatous polyp, diverticulosis, external hemorrhoids  . Cataract extraction     bilateral  . Upper gastrointestinal endoscopy 07/03/2010    hiatal hernia     Outpatient Encounter Prescriptions as  of 04/13/2012  Medication Sig Dispense Refill    . amLODipine (NORVASC) 10 MG tablet Take 1 tablet (10 mg total) by mouth daily.  90 tablet  3  . aspirin 81 MG tablet Take 81 mg by mouth daily.        Marland Kitchen azelastine (ASTELIN) 137 MCG/SPRAY nasal spray Use 1-2 sprays in each nostril two times daily as needed  30 mL  5  . Calcium Carbonate-Vitamin D 600-400 MG-UNIT per tablet Take 1 tablet by mouth 2 (two) times daily.        . chlorpheniramine-HYDROcodone (TUSSIONEX) 10-8 MG/5ML LQCR TAKE 1 TEASPOONFUL EVERY 12 HOURS  140 mL  0  . Cholecalciferol (VITAMIN D3) 2000 UNITS TABS Take 2,000 Units by mouth daily.        Marland Kitchen dicyclomine (BENTYL) 10 MG capsule Take 1 tab twice daily as needed for cramping and spasms.  60 capsule  2  . lansoprazole (PREVACID) 15 MG capsule Take 1 capsule (15 mg total) by mouth 2 (two) times daily.  90 capsule  3  . LORazepam (ATIVAN) 1 MG tablet Take 1 tablet (1 mg total) by mouth at bedtime as needed.  30 tablet  5  . losartan (COZAAR) 50 MG tablet Take 50 mg by mouth daily.      . meloxicam (MOBIC) 7.5 MG tablet Take 7.5 mg by mouth daily. As needed       . metoprolol succinate (TOPROL XL) 25 MG 24 hr tablet Take 1 tablet (25 mg total) by mouth daily.  90 tablet  3  . Probiotic Product (ALIGN) 4 MG CAPS Take 1 capsule by mouth daily.      . simethicone (MYLICON) 125 MG chewable tablet Chew 125 mg by mouth every 6 (six) hours as needed.      . simvastatin (ZOCOR) 40 MG tablet Take 40 mg by mouth every evening.      . [DISCONTINUED] losartan (COZAAR) 50 MG tablet Take 1/2 tablet by mouth once daily  45 tablet  3  . [DISCONTINUED] azithromycin (ZITHROMAX) 250 MG tablet Take as directed  6 tablet  0  . [DISCONTINUED] buPROPion (WELLBUTRIN SR) 100 MG 12 hr tablet Take 1 tablet (100 mg total) by mouth daily.  90 tablet  3  . [DISCONTINUED] fluticasone (FLONASE) 50 MCG/ACT nasal spray Place 2 sprays into the nose daily.        . [DISCONTINUED] loratadine (CLARITIN) 10 MG tablet Take 10 mg by mouth daily.      . [DISCONTINUED]  methylPREDNISolone (MEDROL, PAK,) 4 MG tablet Take as directed  21 tablet  0  . [DISCONTINUED] simvastatin (ZOCOR) 20 MG tablet Take 1 tablet (20 mg total) by mouth at bedtime.  90 tablet  3  . [DISCONTINUED] sulfamethoxazole-trimethoprim (BACTRIM DS) 800-160 MG per tablet Take 1 tablet by mouth 2 (two) times daily.  14 tablet  0   Facility-Administered Encounter Medications as of 04/13/2012  Medication Dose Route Frequency Provider Last Rate Last Dose  . 0.9 %  sodium chloride infusion  500 mL Intravenous Continuous Iva Boop, MD        Allergies  Allergen Reactions  . Alendronate Sodium     REACTION: pt states INTOL to Fosamax w/ esophagitis  . Cefdinir     REACTION: diarrhea  . Esomeprazole Magnesium     REACTION: pt states INTOL to Nexium  . Levofloxacin     REACTION: diarrhea  . Omeprazole     REACTION: pt states INTOL to Prilosec  .  Penicillins   . Shellfish-Derived Products Hives, Nausea Only and Rash    Current Medications, Allergies, Past Medical History, Past Surgical History, Family History, and Social History were reviewed in [employer] record.    Review of Systems      See HPI - all other systems neg except as noted...      The patient denies anorexia, fever, weight loss, weight gain, vision loss, decreased hearing, hoarseness, chest pain, syncope, dyspnea on exertion, peripheral edema, prolonged cough, headaches, hemoptysis, abdominal pain, melena, hematochezia, severe indigestion/heartburn, hematuria, incontinence, muscle weakness, suspicious skin lesions, transient blindness, difficulty walking, depression, unusual weight change, abnormal bleeding, enlarged lymph nodes, and angioedema.     Objective:   Physical Exam    WD, WN, 79 y/o WF in NAD... GENERAL:  Alert & oriented; pleasant & cooperative... HEENT:  [place]/AT, EOM-wnl, PERRLA, EACs-clear, TMs-wnl, NOSE-sl drainage, THROAT-clear & wnl. NECK:  Supple w/ fairROM; no JVD; normal  carotid impulses w/o bruits; no thyromegaly or nodules palpated; no lymphadenopathy. CHEST:  Clear to P & A; without wheezes/ rales/ or rhonchi heard... HEART:  Regular Rhythm; gr 1/6 SEM at LSB, no rubs, no gallops apprec... ABDOMEN:  Soft & nontender; normal bowel sounds; no organomegaly or masses detected. EXT: without deformities, mild arthritic changes; hx varicose veins/ +venous insuffic/ no edema. no muscle tenderness etc... NEURO:  CN's intact; no focal neuro deficits... DERM:  No lesions noted; no rash etc...  RADIOLOGY DATA:  Reviewed in the EPIC EMR & discussed w/ the patient...  LABORATORY DATA:  Reviewed in the EPIC EMR & discussed w/ the patient...   Assessment & Plan:    AR, Chronic Sinus Problems>  We reviewed regimen w/ Antihist Qam, Saline nasal mist every 1-2H during the day, Mucinex 2 tabsBid, & Flonase Qhs... She has now had both ENT & Allergy evals...  HBP>  Controlled on meds, continue same...  CAD>  Followed by Krystal [name] on meds listed; seen recently & he did labs according to the pt; copies pending.  Ven Insuffic>  She had VV surg in 2001; she knows to elim salt, elevate, wear support hose...  HYPERLIPID>  Improved on Simva40, labs here 1/14 looked good...  GI> HH, GERD, Divertics, Polyps>  Wants change to H2Blockers eg- ZANTAC Rx...  DJD, LBP>  Evaluated by Susann Givens in past; prev hx partial T7-T8 compression...  Osteoporosis>  Managed by her GYN; on Calcium, MVI, Vit D; w/ the above compression she took Forteo in past but only for 8months...  Anxiety/ Depression>  On Wellbutrin for depression & requests refill;  On Lorazepam for nerves as well.Marland KitchenMarland Kitchen

## 2012-04-14 LAB — VITAMIN D 25 HYDROXY (VIT D DEFICIENCY, FRACTURES): Vit D, 25-Hydroxy: 41 ng/mL (ref 30–89)

## 2012-05-17 ENCOUNTER — Telehealth: Payer: Self-pay | Admitting: Pulmonary Disease

## 2012-05-17 MED ORDER — METHYLPREDNISOLONE 4 MG PO KIT: PACK | ORAL | Status: DC

## 2012-05-17 MED ORDER — DOXYCYCLINE HYCLATE 100 MG PO TABS: 100.0000 mg | ORAL_TABLET | Freq: Two times a day (BID) | ORAL | Status: DC

## 2012-05-17 NOTE — Telephone Encounter (Signed)
Spoke with pt c/o ST,Deep productive cough,headache,wheezing,denies any fever,sob,has been Using mucinex dm and delsym cough with no relief. Allergies  Allergen Reactions  . Alendronate Sodium     REACTION: pt states INTOL to Fosamax w/ esophagitis  . Cefdinir     REACTION: diarrhea  . Esomeprazole Magnesium     REACTION: pt states INTOL to Nexium  . Levofloxacin     REACTION: diarrhea  . Omeprazole     REACTION: pt states INTOL to Prilosec  . Penicillins   . Shellfish-Derived Products Hives, Nausea Only and Rash   Dr Kriste Basque please advise. Thank you

## 2012-05-17 NOTE — Telephone Encounter (Signed)
Per SN---  Ok for doxycycline 100 mg  #20  1 po bid mucinex 600 mg  2 po bid with plenty of fluids Medrol dosepak.  thanks

## 2012-05-17 NOTE — Telephone Encounter (Signed)
Spoke with pt and notified of recs per SN She verbalized understanding and states nothing further needed Rxs were sent to pharm   

## 2012-05-21 ENCOUNTER — Emergency Department (HOSPITAL_COMMUNITY)
Admission: EM | Admit: 2012-05-21 | Discharge: 2012-05-21 | Disposition: A | Discharge status: Home / Self Care | Payer: Medicare Other | Attending: Emergency Medicine | Admitting: Emergency Medicine

## 2012-05-21 ENCOUNTER — Emergency Department (HOSPITAL_COMMUNITY): Payer: Medicare Other

## 2012-05-21 ENCOUNTER — Encounter (HOSPITAL_COMMUNITY): Payer: Self-pay | Admitting: *Deleted

## 2012-05-21 DIAGNOSIS — M1990 Unspecified osteoarthritis, unspecified site: Secondary | ICD-10-CM | POA: Insufficient documentation

## 2012-05-21 DIAGNOSIS — Z8669 Personal history of other diseases of the nervous system and sense organs: Secondary | ICD-10-CM | POA: Insufficient documentation

## 2012-05-21 DIAGNOSIS — Z862 Personal history of diseases of the blood and blood-forming organs and certain disorders involving the immune mechanism: Secondary | ICD-10-CM | POA: Insufficient documentation

## 2012-05-21 DIAGNOSIS — R0989 Other specified symptoms and signs involving the circulatory and respiratory systems: Secondary | ICD-10-CM | POA: Insufficient documentation

## 2012-05-21 DIAGNOSIS — Z87448 Personal history of other diseases of urinary system: Secondary | ICD-10-CM | POA: Insufficient documentation

## 2012-05-21 DIAGNOSIS — R062 Wheezing: Secondary | ICD-10-CM | POA: Insufficient documentation

## 2012-05-21 DIAGNOSIS — R0682 Tachypnea, not elsewhere classified: Secondary | ICD-10-CM | POA: Insufficient documentation

## 2012-05-21 DIAGNOSIS — M545 Low back pain, unspecified: Secondary | ICD-10-CM | POA: Insufficient documentation

## 2012-05-21 DIAGNOSIS — F411 Generalized anxiety disorder: Secondary | ICD-10-CM | POA: Insufficient documentation

## 2012-05-21 DIAGNOSIS — I10 Essential (primary) hypertension: Secondary | ICD-10-CM | POA: Insufficient documentation

## 2012-05-21 DIAGNOSIS — Z860100 Personal history of colon polyps, unspecified: Secondary | ICD-10-CM | POA: Insufficient documentation

## 2012-05-21 DIAGNOSIS — E785 Hyperlipidemia, unspecified: Secondary | ICD-10-CM | POA: Insufficient documentation

## 2012-05-21 DIAGNOSIS — I2510 Atherosclerotic heart disease of native coronary artery without angina pectoris: Secondary | ICD-10-CM | POA: Insufficient documentation

## 2012-05-21 DIAGNOSIS — G8929 Other chronic pain: Secondary | ICD-10-CM | POA: Insufficient documentation

## 2012-05-21 DIAGNOSIS — J209 Acute bronchitis, unspecified: Secondary | ICD-10-CM | POA: Insufficient documentation

## 2012-05-21 DIAGNOSIS — R079 Chest pain, unspecified: Secondary | ICD-10-CM | POA: Insufficient documentation

## 2012-05-21 DIAGNOSIS — Z8679 Personal history of other diseases of the circulatory system: Secondary | ICD-10-CM | POA: Insufficient documentation

## 2012-05-21 DIAGNOSIS — R0609 Other forms of dyspnea: Secondary | ICD-10-CM | POA: Insufficient documentation

## 2012-05-21 DIAGNOSIS — J40 Bronchitis, not specified as acute or chronic: Secondary | ICD-10-CM

## 2012-05-21 DIAGNOSIS — Z79899 Other long term (current) drug therapy: Secondary | ICD-10-CM | POA: Insufficient documentation

## 2012-05-21 DIAGNOSIS — Z7982 Long term (current) use of aspirin: Secondary | ICD-10-CM | POA: Insufficient documentation

## 2012-05-21 DIAGNOSIS — Z8719 Personal history of other diseases of the digestive system: Secondary | ICD-10-CM | POA: Insufficient documentation

## 2012-05-21 DIAGNOSIS — K219 Gastro-esophageal reflux disease without esophagitis: Secondary | ICD-10-CM | POA: Insufficient documentation

## 2012-05-21 DIAGNOSIS — M810 Age-related osteoporosis without current pathological fracture: Secondary | ICD-10-CM | POA: Insufficient documentation

## 2012-05-21 LAB — BASIC METABOLIC PANEL
Calcium: 8.8 mg/dL (ref 8.4–10.5)
Chloride: 102 mEq/L (ref 96–112)
Creatinine, Ser: 0.72 mg/dL (ref 0.50–1.10)
GFR calc Af Amer: >90 mL/min (ref >90)

## 2012-05-21 LAB — CBC
MCV: 94.1 fL (ref 78.0–100.0)
Platelets: 213 10*3/uL (ref 150–400)
RDW: 13.7 % (ref 11.5–15.5)
WBC: 7.3 10*3/uL (ref 4.0–10.5)

## 2012-05-21 LAB — PRO B NATRIURETIC PEPTIDE: Pro B Natriuretic peptide (BNP): 370.6 pg/mL (ref 0–450)

## 2012-05-21 MED ORDER — PREDNISONE 20 MG PO TABS: 40.0000 mg | ORAL_TABLET | Freq: Once | ORAL | Status: AC

## 2012-05-21 MED ORDER — ALBUTEROL SULFATE (5 MG/ML) 0.5% IN NEBU
2.5000 mg | INHALATION_SOLUTION | RESPIRATORY_TRACT | Status: AC
  Administered 2012-05-21: 2.5 mg via RESPIRATORY_TRACT
  Filled 2012-05-21: qty 0.5

## 2012-05-21 MED ORDER — PREDNISONE 20 MG PO TABS
40.0000 mg | ORAL_TABLET | Freq: Once | ORAL | Status: AC
  Administered 2012-05-21: 40 mg via ORAL
  Filled 2012-05-21: qty 2

## 2012-05-21 MED ORDER — ALBUTEROL SULFATE HFA 108 (90 BASE) MCG/ACT IN AERS
2.0000 | INHALATION_SPRAY | Freq: Four times a day (QID) | RESPIRATORY_TRACT | Status: DC
  Administered 2012-05-21: 2 via RESPIRATORY_TRACT
  Filled 2012-05-21: qty 6.7

## 2012-05-21 MED ORDER — IPRATROPIUM BROMIDE 0.02 % IN SOLN
0.5000 mg | RESPIRATORY_TRACT | Status: AC
  Administered 2012-05-21: 0.5 mg via RESPIRATORY_TRACT
  Filled 2012-05-21: qty 2.5

## 2012-05-21 NOTE — ED Notes (Addendum)
Incentive spirometer education given and pt tolerated use well. Reached 

## 2012-05-21 NOTE — ED Provider Notes (Signed)
History     CSN: [id number]  Arrival date & time 05/21/12  0814   First MD Initiated Contact with Patient 05/21/12 [phone]      Chief Complaint  Patient presents with  . Cough  . chest tightness from wheezing     (Consider location/radiation/quality/duration/timing/severity/associated sxs/prior treatment) HPI The patient presents with dyspnea.  She states that symptoms began about one week ago, without clear precipitant.  Since onset she has had persistent dyspnea, wheezing, cough.  One week ago she started amoxicillin and low-dose steroids from her physician.  She states that over the interval week she continues to have the aforementioned complaints.  There is minimal pain, no fever, no vomiting.  The patient does endorse nausea.  She denies pleuritic or exertional changes.  She does also complain of mild headache, mild diffuse discomfort. Past Medical History  Diagnosis Date  . Hypertension   . CAD (coronary artery disease)   . Venous insufficiency   . Hyperlipidemia   . Hiatal hernia   . GERD (gastroesophageal reflux disease)   . Diverticulosis   . Adenomatous colon polyp   . Renal cysts, acquired, bilateral   . Osteoarthritis   . Chronic lower back pain   . Headache   . Anxiety   . Allergy     seasonal  . Anemia     pt. denies  . Cataract     cataracts removed left eye.  . Osteoporosis   . IBS (irritable bowel syndrome) 10/13/2011    Past Surgical History  Procedure Laterality Date  . Abdominal hysterectomy    . Varicose vein surgery    . Bladder repair    . Colonoscopy  08/17/2008    adenomatous polyp, diverticulosis, external hemorrhoids  . Cataract extraction      bilateral  . Upper gastrointestinal endoscopy  07/03/2010    hiatal hernia    Family History  Problem Relation Age of Onset  . Heart disease Father   . Colon cancer Neg Hx     History  Substance Use Topics  . Smoking status: Never Smoker   . Smokeless tobacco: Never Used  . Alcohol Use: 0.0  oz/week     Comment: occasionally    OB History   Grav Para Term Preterm Abortions TAB SAB Ect Mult Living                  Review of Systems  Constitutional:       Per HPI, otherwise negative  HENT:       Per HPI, otherwise negative  Respiratory:       Per HPI, otherwise negative  Cardiovascular:       Per HPI, otherwise negative  Gastrointestinal: Negative for vomiting.  Endocrine:       Negative aside from HPI  Genitourinary:       Neg aside from HPI   Musculoskeletal:       Per HPI, otherwise negative  Skin: Negative.   Neurological: Negative for syncope.    Allergies  Alendronate sodium; Cefdinir; Esomeprazole magnesium; Levofloxacin; Omeprazole; Penicillins; and Shellfish-derived products  Home Medications   Current Outpatient Rx  Name  Route  Sig  Dispense  Refill  . amLODipine (NORVASC) 10 MG tablet   Oral   Take 1 tablet (10 mg total) by mouth daily.   90 tablet   3   . aspirin 81 MG tablet   Oral   Take 81 mg by mouth daily.           Marland Kitchen  azelastine (ASTELIN) 137 MCG/SPRAY nasal spray      Use 1-2 sprays in each nostril two times daily as needed   30 mL   5   . Calcium Carbonate-Vitamin D 600-400 MG-UNIT per tablet   Oral   Take 1 tablet by mouth 2 (two) times daily.           . chlorpheniramine-HYDROcodone (TUSSIONEX) 10-8 MG/5ML LQCR      TAKE 1 TEASPOONFUL EVERY 12 HOURS   140 mL   0   . Cholecalciferol (VITAMIN D3) 2000 UNITS TABS   Oral   Take 2,000 Units by mouth daily.           Marland Kitchen dicyclomine (BENTYL) 10 MG capsule      Take 1 tab twice daily as needed for cramping and spasms.   60 capsule   2   . doxycycline (VIBRA-TABS) 100 MG tablet   Oral   Take 1 tablet (100 mg total) by mouth 2 (two) times daily.   20 tablet   0   . lansoprazole (PREVACID) 15 MG capsule   Oral   Take 1 capsule (15 mg total) by mouth 2 (two) times daily.   90 capsule   3   . LORazepam (ATIVAN) 1 MG tablet   Oral   Take 1 tablet (1 mg  total) by mouth at bedtime as needed.   30 tablet   5   . losartan (COZAAR) 50 MG tablet   Oral   Take 1 tablet (50 mg total) by mouth daily.   90 tablet   3   . meloxicam (MOBIC) 7.5 MG tablet   Oral   Take 1 tablet (7.5 mg total) by mouth daily. As needed   90 tablet   3   . methylPREDNISolone (MEDROL, PAK,) 4 MG tablet      follow package directions   21 tablet   0   . metoprolol succinate (TOPROL XL) 25 MG 24 hr tablet   Oral   Take 1 tablet (25 mg total) by mouth daily.   90 tablet   3   . Probiotic Product (ALIGN) 4 MG CAPS   Oral   Take 1 capsule by mouth daily.         . simethicone (MYLICON) 125 MG chewable tablet   Oral   Chew 125 mg by mouth every 6 (six) hours as needed.         . simvastatin (ZOCOR) 40 MG tablet   Oral   Take 40 mg by mouth every evening.           BP 145/98  Pulse 30  Temp(Src) 97.8 F (36.6 C) (Oral)  Resp 16  SpO2 95%  Physical Exam  Nursing note and vitals reviewed. Constitutional: She is oriented to person, place, and time. She appears well-developed and well-nourished. No distress.  HENT:  Head: Normocephalic and atraumatic.  Eyes: Conjunctivae and EOM are normal.  Cardiovascular: Normal rate and regular rhythm.   Pulmonary/Chest: No stridor. Tachypnea noted. No respiratory distress. She has wheezes.  Abdominal: She exhibits no distension.  Musculoskeletal: She exhibits no edema.  Neurological: She is alert and oriented to person, place, and time. No cranial nerve deficit.  Skin: Skin is warm and dry.  Psychiatric: She has a normal mood and affect.    ED Course  Procedures (including critical care time)  Labs Reviewed  CBC  BASIC METABOLIC PANEL  PRO B NATRIURETIC PEPTIDE  TROPONIN I   No results  found.   No diagnosis found.  Cardiac 85 sinus rhythm normal Pulse ox 95%ra, borderline   Date: 05/21/2012  Rate: 89  Rhythm: normal sinus rhythm  QRS Axis: normal  Intervals: QT prolonged  ST/T  Wave abnormalities: nonspecific T wave changes  Conduction Disutrbances:pvc, artefact  Narrative Interpretation:   Old EKG Reviewed: unchanged  ABNORMAL   10:31 AM BS slightly improved.  She states that she feels better.  Update: Breath sounds significantly better, patient remains in no distress.  She states that she feels substantially improved MDM  This pleasant elderly female presents with ongoing cough, wheeze, chest discomfort.  On initial exam the patient is diffuse wheezing.  This improved substantially.  The patient assessment stable, and her x-ray does not demonstrate pneumonia.  With a nonischemic EKG, no ongoing chest pain or discomfort, and a significant improvement, the patient was discharged in stable condition with primary care followup.     Gerhard Munch, MD 05/21/12 1301

## 2012-05-21 NOTE — ED Notes (Addendum)
Pt reports has been on abx for 1 week for flu like symptoms, pt reports s/s have gotten worse. "Feels congestion in chest that she cant cough up". Headache, body aches, cough. Reports burning and wheezing noise in central chest. "cant breathe at night". Reports SOB. Expiratory wheezing heard in all lung fields  Has cardiac hx, stent placed in 2003

## 2012-05-21 NOTE — ED Notes (Signed)
Pt educated on use of inhaler- administered two puffs without any issues.

## 2012-05-24 ENCOUNTER — Telehealth: Payer: Self-pay | Admitting: Pulmonary Disease

## 2012-05-24 MED ORDER — PREDNISONE (PAK) 5 MG PO TABS: ORAL_TABLET | ORAL | Status: DC

## 2012-05-24 NOTE — Telephone Encounter (Signed)
Pt is aware of recs and RX from SN. Pt was transferred to schedulers to schedule her 6 month ROV with SN from her last visit in 03-2012.  Rx sent to [facility] [address]

## 2012-05-24 NOTE — Telephone Encounter (Signed)
Per SN----call in pred dosepak  5 mg  6 day pack and ok to restart the mucinex 1-2 po bid with plenty of fluids.  thanks

## 2012-05-24 NOTE — Telephone Encounter (Signed)
Pt states she went to ER Friday for what was dx'd as bronchitis; was given Prednisone 40 mg QD for 3 days-she finished this today. Pt was told to call and follow up with SN for this illness. Pt was seen 03/2012 by SN; was offered to come in to see TP but patient declined stating she only wants to see SN and really doesn't want to have to come in if not needed. Pt would also like to know if she should go back on Mucinex DM as she stopped taking it. Please advise. Thanks.

## 2012-06-01 ENCOUNTER — Telehealth: Payer: Self-pay | Admitting: Pulmonary Disease

## 2012-06-01 NOTE — Telephone Encounter (Signed)
Per SN---  Give it some time otc mucinex 600 mg  2 po bid with plenty of fluids Tylenol daily  Called and spoke with pt and she is aware of SN recs.  Nothing further is needed.

## 2012-06-01 NOTE — Telephone Encounter (Signed)
Called and spoke with pt and she stated that she has finished the pred taper and the doxy---still not better.  Still coughing with yellow sputum and nasal congestion that is yellow.  Pt stated that she really did not want to take any further meds but is not sure if this will clear up without anything.  SN please advise. Thanks  Allergies  Allergen Reactions  . Alendronate Sodium     REACTION: pt states INTOL to Fosamax w/ esophagitis  . Cefdinir     REACTION: diarrhea  . Esomeprazole Magnesium     REACTION: pt states INTOL to Nexium  . Levofloxacin     REACTION: diarrhea  . Omeprazole     REACTION: pt states INTOL to Prilosec  . Penicillins   . Shellfish-Derived Products Hives, Nausea Only and Rash

## 2012-06-07 ENCOUNTER — Telehealth: Payer: Self-pay | Admitting: Pulmonary Disease

## 2012-06-07 MED ORDER — SULFAMETHOXAZOLE-TRIMETHOPRIM 800-160 MG PO TABS: 1.0000 | ORAL_TABLET | Freq: Two times a day (BID) | ORAL | Status: DC

## 2012-06-07 NOTE — Telephone Encounter (Signed)
Per SN---  Septra ds  #14  1 po bid.  thanks

## 2012-06-07 NOTE — Telephone Encounter (Signed)
I spoke with pt and is aware of SN recs. She voiced her understanding. rx has been sent. Nothing further was needed

## 2012-06-07 NOTE — Telephone Encounter (Signed)
I spoke with pt. She c/o cough w/ green phlem, facial pressure, PND, blowing out stringy green phlem mixed with blood, watery/itchy eyes, dizzy spells x 1 week, chills, not sleeping well. She has been taking tylenol for this. On 2/05/04/12 she was giving doxy and pred for bronchitis. Please advise SN thanks  Last OV 04/13/12 Pending 09-30-12 Allergies  Allergen Reactions  . Alendronate Sodium     REACTION: pt states INTOL to Fosamax w/ esophagitis  . Cefdinir     REACTION: diarrhea  . Esomeprazole Magnesium     REACTION: pt states INTOL to Nexium  . Levofloxacin     REACTION: diarrhea  . Omeprazole     REACTION: pt states INTOL to Prilosec  . Penicillins   . Shellfish-Derived Products Hives, Nausea Only and Rash

## 2012-07-05 ENCOUNTER — Other Ambulatory Visit: Payer: Self-pay

## 2012-07-05 ENCOUNTER — Other Ambulatory Visit: Payer: Self-pay | Admitting: Obstetrics

## 2012-07-05 DIAGNOSIS — Z1231 Encounter for screening mammogram for malignant neoplasm of breast: Secondary | ICD-10-CM

## 2012-07-29 ENCOUNTER — Telehealth: Payer: Self-pay | Admitting: Internal Medicine

## 2012-07-29 MED ORDER — LANSOPRAZOLE 15 MG PO CPDR: 15.0000 mg | DELAYED_RELEASE_CAPSULE | Freq: Two times a day (BID) | ORAL | Status: DC

## 2012-07-29 NOTE — Telephone Encounter (Signed)
Rx sent to [facility] as requested.  

## 2012-08-05 ENCOUNTER — Ambulatory Visit: Payer: Medicare Other

## 2012-09-01 ENCOUNTER — Ambulatory Visit
Admission: RE | Admit: 2012-09-01 | Discharge: 2012-09-01 | Disposition: A | Discharge status: Home / Self Care | Payer: Medicare Other | Source: Ambulatory Visit

## 2012-09-01 DIAGNOSIS — Z1231 Encounter for screening mammogram for malignant neoplasm of breast: Secondary | ICD-10-CM

## 2012-09-13 ENCOUNTER — Telehealth: Payer: Self-pay | Admitting: Pulmonary Disease

## 2012-09-13 MED ORDER — AMLODIPINE BESYLATE 10 MG PO TABS: 10.0000 mg | ORAL_TABLET | Freq: Every day | ORAL | Status: DC

## 2012-09-13 NOTE — Telephone Encounter (Signed)
Pt is aware RX sent to [facility] for 90 day supply. Nothing further was needed

## 2012-09-30 ENCOUNTER — Ambulatory Visit (INDEPENDENT_AMBULATORY_CARE_PROVIDER_SITE_OTHER): Payer: Medicare Other | Admitting: Pulmonary Disease

## 2012-09-30 ENCOUNTER — Encounter: Payer: Self-pay | Admitting: Pulmonary Disease

## 2012-09-30 VITALS — BP 128/80 | HR 78 | Temp 97.0°F | Ht 64.0 in | Wt 163.2 lb

## 2012-09-30 DIAGNOSIS — K219 Gastro-esophageal reflux disease without esophagitis: Secondary | ICD-10-CM

## 2012-09-30 DIAGNOSIS — N281 Cyst of kidney, acquired: Secondary | ICD-10-CM

## 2012-09-30 DIAGNOSIS — I10 Essential (primary) hypertension: Secondary | ICD-10-CM

## 2012-09-30 DIAGNOSIS — I2510 Atherosclerotic heart disease of native coronary artery without angina pectoris: Secondary | ICD-10-CM

## 2012-09-30 DIAGNOSIS — E785 Hyperlipidemia, unspecified: Secondary | ICD-10-CM

## 2012-09-30 DIAGNOSIS — F411 Generalized anxiety disorder: Secondary | ICD-10-CM

## 2012-09-30 DIAGNOSIS — M1990 Unspecified osteoarthritis, unspecified site: Secondary | ICD-10-CM

## 2012-09-30 DIAGNOSIS — I872 Venous insufficiency (chronic) (peripheral): Secondary | ICD-10-CM

## 2012-09-30 DIAGNOSIS — K5730 Diverticulosis of large intestine without perforation or abscess without bleeding: Secondary | ICD-10-CM

## 2012-09-30 DIAGNOSIS — M810 Age-related osteoporosis without current pathological fracture: Secondary | ICD-10-CM

## 2012-09-30 DIAGNOSIS — J309 Allergic rhinitis, unspecified: Secondary | ICD-10-CM

## 2012-09-30 NOTE — Progress Notes (Signed)
Subjective:    Patient ID: Krystal [name], female    DOB: [birth year], 79 y.o.   MRN: [id number]  HPI 79 y/o WF here for a follow up visit... she has multiple medical problems as noted below...   ~  April 08, 2011:  100mo ROV & add-on for bronchitic symptoms> c/o cough, congestion, green sput, some wheezing x 3wks; notes assoc HA, aches/ pains, & low grade temp ~100; denies CP, SOB, etc; ZPak called in + Mucinex but no better; went to [facility] & given Pred, ?Augmentin, cough syrup; now c/o persistent deep cough esp at night;  We discussed further eval w/ CXR (?COPD, chr changes, NAD) & LABS (wnl);  Rx w/ SeptraDS (she lists allergy to Levaquin), Tussionex, Mucinex + Fluids, & Depo80/ Pred 10mg - 3d taper...    >She saw DrESL 10/12 for recurrent sinus infections, frontal HA, PND, dry cough; he did allergy testing- ?results (not indicated in his note); rec to take saline wash, Claritin, Flonase, Patanase, Medrol 4mg  prolonged course...    >She saw ENT DrByers 10/12 w/ CT sinuses reported to be normal & nothing found, he rec allergy eval...    >She is followed by DrGessner & needs a much more vigorous antireflux regimen w/ her chr symptoms...  ~  October 08, 2011:  31mo ROV & [name] is stable w/o new complaints or concerns; she had some sinus congestion but the recent rain has helped...    She saw GI 6/13 w/ chr GERD controlled on Prevacid; hx colon polyps w/ colon 4/10 showing divertics & one sm adenoma removed; c/o abd pain, incr BMs & mucus in stool> treated w/ Cipro, Bentyl, Align and improved overall...    She saw the Urology PA 5/13> f/u renal cysts & she uses Estrace cream w/ improved lower tract symptoms; Sonar was essentially unchanged, she voided well, no signif PVR; no meds- rec f/u one yr...    We reviewed prob list, meds, xrays and labs> see below for updates>> LABS 7/13:  FLP- no at goals on Simva20 w/ TG=151 LDL 111...  ~  April 13, 2012:  31mo ROV & [name] persists w/ complaints of sinus  drainage, some cough, etc; she's been using the Astelin, Saline, & Zyrtek (hx intol to Allegra) and has seen Allergist & ENT in the past; offered change to [facility] but she felt everything is about stable & content to continue same Rx... We reviewed the following medical problems during today's office visit >> she did not bring her med bottles to review >>    HBP> on MetopER25, Amlod10, Losar50; BP= 132/70 & she denies CP, palpit, dizzy, SOB, edema...    CAD> on ASA81; followed by DrRW- seen 8/13 & stable w/o angina, he increased her Simva20=>40mg /d...    VenInsuffic> on low sodium diet, not requiring diuretics; she knows to elev legs, wear support hose prn...    Hyperlipid> on Simva40 now; FLP on this dose shows TChol 161, TG 133, HDL 44, LDL 91    GI- GERD, Divertics, IBS, Polyps> on Prev15Bid, Bentyl20mg  prn, Align daily; states she is doing well w/o abd pain, n/v, c/d, blood seen...    DJD, LBP> on Mobic7.5 prn; all symptoms improved & exercising daily...     Osteoporosis> followed & managed by Gyn- part T7&8 compression fx, prev Rx w/ Forteo, now on Calcium, MVI, VitD; she reports that Drfogelman treated her w/ Prolia but ?she states she only takes it when she needs it?    Anxiety> on Ativan 1mg -  takes it Qhs for sleep... We reviewed prob list, meds, xrays and labs> see below for updates >> she had the Flu shot in Oct 7 Rx written for Shingles vaccine should she decide to get it... LABS 1/14:  FLP- at goals on Simva40;  Chems- wnl;  CBC- wnl;  TSH=0.94;  VitD=41 on 2000u/d...  ~  September 30, 2012:  15mo ROV and [name] reports doing well- no new complaints or concerns; she reports being started on Prolia shots Q70mo per Gyn for her osteoporosis + compression fxs; We reviewed the following medical problems during today's office visit >>     HBP> on MetopER25, Amlod10, Losar50; BP= 128/80 & she denies CP, palpit, dizzy, SOB, edema...    CAD> on ASA81; followed by DrRW- seen 8/13 & stable w/o angina, he  increased her Simva20=>40mg /d...    VenInsuffic> on low sodium diet, not requiring diuretics; she knows to elev legs, wear support hose prn...    Hyperlipid> on Simva40 now; FLP 1/14 showed TChol 161, TG 133, HDL 44, LDL 91    GI- GERD, Divertics, IBS, Polyps> on Prev15Bid, Bentyl10mg  prn, Align daily; states she is doing well w/o abd pain, n/v, c/d, blood seen...    DJD, LBP> on Mobic7.5 prn; all symptoms improved & exercising daily...     Osteoporosis> followed & managed by Gyn- part T7&8 compression fx, prev Rx w/ Forteo, now on Calcium, MVI, VitD; she reports that DrFogelman treated her w/ Prolia and now Q65mo...    Anxiety> on Ativan 1mg - takes it Qhs for sleep as needed... We reviewed prob list, meds, xrays and labs> see below for updates >>            PROBLEM LIST:  ALLERGIC RHINITIS (ICD-477.9) - she uses OTC antihistamines + saline nasal spray Prn... "I have alot of mucous"- Rx w/ antihist, mucinex, cough syrups (wants Tussionex)... she had some left ear discomfort, dry skin & DrBates suggested mineral oil drop or two on occas Prn. ~  10/12:  She saw DrByers for ENT eval- neg w/ normal CT sinuses;  She saw DrESL for allergy eval & ?result of skin tests but not rec for allergy shots- given saline wash, Claritin, Flonase, Patanase, Medrol 4mg  prolonged course... ~  We discussed Rx w/ Antihist in AM, spray Saline Q1-2H during the day, take Mucinex 1-2 Bid w/ Fluids and use the Flonase Qhs;  we also agreed to Rx acute symptoms w/ Sterapred dosepak... ~  3/14: she had f/u DrVanWinkle> treated for sinusitis w/ Omnicef, & her allergies w/ Depo, Claritin, Flonase, Patanase...  HYPERTENSION (ICD-401.9) - controlled on TOPROL XL 25mg /d, NORVASC 10mg /d, & LOSARTAN 50/d... ~  CXR 12/09 showed some hyperaeration w/ flattening of diaphragms, NAD.Marland Kitchen. ~  8/12: BP= 136/78 here today & even better at home- feeling well, tolerating meds well; denies HA, fatigue, visual changes, CP, palipit, dizziness,  syncope, dyspnea, edema, etc... ~  CXR 1/13 showed heart at the upper lim of norm, tortuous/ ectatic Ao, underlying COPD w/ mild basilar scarring, osteopenia w/ compression fxs, degen spondylosis... ~  1/13: BP= 128/64 & she is essent asymptomatic... ~  7/13:  BP= 122/84 & she denies CP, palpit, dizzy, SOB, edema, etc... ~  1/14: on MetopER25, Amlod10, Losar50; BP= 132/70 & she denies CP, palpit, dizzy, SOB, edema...  CORONARY ARTERY DISEASE (ICD-414.00) - followed by DrWeintraub & [facility]... on the above + ASA 81mg /d, & Statin for her Chol... she denies CP, palpit, change in SOB, edema, etc; she exercises by walking... ~  baseline EKG shows a chr LBBB... ~  cath 10/03 w/ single vessel CAD- 90%CIRC treated w/ PTCA/ stent... ~  Cardiolite 10/06 showed no ischemia... repeat 12/08 showed no change- no ischemia, +diaph attenuation, EF= 64% w/ LBBB... ~  2DEcho 12/08 showed mild conc LVH, mild calcif AoV, no stenosis, RVsys sl up at 40-50 mmHg... ~  2DEcho 4/10 showed borderline conc LVH, norm LVF w/ EF= 55%, mild LA dil, mod MR, RVsys=42... ~  8/12:  She tells me that she saw DrRW last week & he did LABS & ECHO ==> pending. ~  8/13:  She saw DrRW for f/u HBP, CAD, LBBB, Hyperlipid; cath in 2003 w/ 90%LAD w/ stenting & doing well w/o angina; Myoview 2008 w/o ischemia; 2DEcho 8/12 w/ mild conc LVH, no valv dis, norm LVF, & mild DD... He increased her Simva20=>40mg /d...  VENOUS INSUFFICIENCY (ICD-459.81) - she knows to elim sodium, elevate legs, wear support hose (she is not on a diuretic)... she had VV surg in 2001 by [facility]... ~  She had Ven Dopplers per DrBlackman 3/13> neg for DVT  HYPERLIPIDEMIA (ICD-272.4) -  insurance company will not cover Crestor, only generics... she notes musc "cramps" on mult statins prev... now on SIMVASTATIN 20mg /d w/ some symptoms but tolerable... ~  FLP 8/09 on Crestor10 = TChol 154, Tg 132, HDL 41, LDL 86 (goal is <70 for secondary prevent). ~  FLP 9/10 on Prav20 showed  TChol 211, TG 136, HDL 37, LDL 160... rec> try to incr Prav to 40mg /d> intol. ~  FLP 2/11 on Simva20 showed TChol 168, TG 123, HDL 50, LDL 94... continue same. ~  FLP 11/11 on Simva20 showed TChol 146, TG 120, HDL 38, LDL 84 ~  FLP 7/13 on Simva20 showed TChol 190, TG 151, HDL 48, LDL 111 ~  FLP 1/14 on simva40 showed TChol 161, TG 133, HDL 44, LDL 91   HIATAL HERNIA (ICD-553.3) & GERD (ICD-530.81) - followed by DrGessner & rec to incr PREVACID to 30mg /d; but she says "DrBates scared me & says I shouldn't be taking the Prevacid" therefore she wants ZANTAC 150mg  Bid... ~  last EGD 1/98 showed 3-4cmHH, mod reflux esophagitis, mild duodenitis...  ~  12/10:  seen by GI w/ GERD & bloating- Rx w/ Pantoprazole & Bentyl... ~  4/12: Hx GERD, c/o intermittent dysphagia, & EGD showed 3cmHH, otherw neg, no stricture found; rec to incr Prevacid to 30mg /d, but she wants top change to [facility] as noted above... ~  1/14: on Prev15Bid, Bentyl20mg  prn, Align daily; states she is doing well w/o abd pain, n/v, c/d, blood seen.   DIVERTICULOSIS OF COLON (ICD-562.10) & COLONIC POLYPS (ICD-211.3) - on BENTYL 20mg  Prn. ~  colonoscopy 6/05 by DrSam showed divertics and two 4-69mm polyps= adenomatous... ~  colonosco[py 5/10 by DrGessner showed divertics, hems, tiny cecal polyp= adenoma... f/u planned 32yrs.  RENAL CYST (ICD-593.2) - [facility], [facility]) and sonars have been stable... ~  She saw the Urology PA 5/13> f/u renal cysts & she uses Estrace cream w/ improved lower tract symptoms; Sonar was essentially unchanged, she voided well, no signif PVR; no meds- rec f/u one yr... ~  6/14: she had f/u DrWrenn> chr cystitis, atrophic vaginitis, kid stones, renal cyst; on Estrace cream & periodic sonars; stable & f/u 74yr...  OSTEOARTHRITIS (ICD-715.90) - eval by Susann Givens 2/09 w/ stress fracture to 4th metatarsal left foot... she takes MOBIC 7.5mg  Prn for hand arthritis & prev saw  DrMyerdierks  for this... she notes that the Mobic helps her BP!  LOW BACK PAIN SYNDROME (ICD-724.2) - she states that her prev LBP has resolved and she is active w/ exercise 3-4 days per week... she had a partial T7 & T8 compression fractures noted in 2003... she took Forteo but only for 8 months per her GYN.  OSTEOPOROSIS (ICD-733.00) - as above>  this is managed and followed by her GYN Debbora Dus, NP) w/ BMD's and currently on calcium and VitD 2000 daily... we do not have copies of her recent BMD's... ~  1/14: followed & managed by Gyn- part T7&8 compression fx, prev Rx w/ Forteo, now on Calcium, MVI, VitD; she reports that DrFogelman treated her w/ Prolia & now takes it Q10mo...  HEADACHE (ICD-784.0)  ANXIETY (ICD-300.00) - on LORAZEPAM 1mg - 1/2 to 1 tab three times a day as needed.  Hx of ANEMIA - hx of anemia in the past... Hg= 14.6 on 11/09/07... Hg= 14.4 on 04/08/11...  Health Maintenance - she had Pneumovax in the past w/ a reaction that she indicates lasted for 44yr... ~  Mammogram 5/13 was neg, & f/u rec in 60yr...   Past Surgical History  Procedure Laterality Date  . Abdominal hysterectomy    . Varicose vein surgery    . Bladder repair    . Colonoscopy  08/17/2008    adenomatous polyp, diverticulosis, external hemorrhoids  . Cataract extraction      bilateral  . Upper gastrointestinal endoscopy  07/03/2010    hiatal hernia     Outpatient Encounter Prescriptions as of 09/30/2012  Medication Sig Dispense Refill  . amLODipine (NORVASC) 10 MG tablet Take 1 tablet (10 mg total) by mouth daily.  90 tablet  3  . aspirin EC 81 MG tablet Take 81 mg by mouth daily.      Marland Kitchen azelastine (ASTELIN) 137 MCG/SPRAY nasal spray Place 1-2 sprays into the nose 2 (two) times daily as needed for rhinitis. Use in each nostril as directed      . Calcium Carbonate-Vitamin D 600-400 MG-UNIT per tablet Take 1 tablet by mouth 2 (two) times daily.        . Cholecalciferol (VITAMIN D3) 2000 UNITS TABS Take 2,000  Units by mouth daily.        Marland Kitchen dicyclomine (BENTYL) 10 MG capsule Take 10 mg by mouth 2 (two) times daily as needed (for cramping and spasms.).      Marland Kitchen lansoprazole (PREVACID) 15 MG capsule Take 1 capsule (15 mg total) by mouth 2 (two) times daily.  180 capsule  3  . LORazepam (ATIVAN) 1 MG tablet Take 0.5 mg by mouth at bedtime as needed for anxiety.      Marland Kitchen losartan (COZAAR) 50 MG tablet Take 1 tablet (50 mg total) by mouth daily.  90 tablet  3  . metoprolol succinate (TOPROL XL) 25 MG 24 hr tablet Take 1 tablet (25 mg total) by mouth daily.  90 tablet  3  . Probiotic Product (ALIGN) 4 MG CAPS Take 1 capsule by mouth daily.      . simethicone (MYLICON) 125 MG chewable tablet Chew 125 mg by mouth every 6 (six) hours as needed for flatulence.       . simvastatin (ZOCOR) 40 MG tablet Take 40 mg by mouth every evening.      . [DISCONTINUED] doxycycline (VIBRA-TABS) 100 MG tablet Take 100 mg by mouth 2 (two) times daily. 10 day course of therapy started 05/17/12.      . [  DISCONTINUED] guaiFENesin (MUCINEX) 600 MG 12 hr tablet Take 1,200 mg by mouth 2 (two) times daily as needed for congestion.      . [DISCONTINUED] predniSONE (STERAPRED UNI-PAK) 5 MG TABS Take as directed on package  21 tablet  0  . [DISCONTINUED] sulfamethoxazole-trimethoprim (SEPTRA DS) 800-160 MG per tablet Take 1 tablet by mouth 2 (two) times daily.  14 tablet  0   Facility-Administered Encounter Medications as of 09/30/2012  Medication Dose Route Frequency Provider Last Rate Last Dose  . 0.9 %  sodium chloride infusion  500 mL Intravenous Continuous Iva Boop, MD        Allergies  Allergen Reactions  . Alendronate Sodium     REACTION: pt states INTOL to Fosamax w/ esophagitis  . Cefdinir     REACTION: diarrhea  . Esomeprazole Magnesium     REACTION: pt states INTOL to Nexium  . Levofloxacin     REACTION: diarrhea  . Omeprazole     REACTION: pt states INTOL to Prilosec  . Other     Pneumonia vaccine---felt tired and  achy and sick x 6 months to get over this.  . Penicillins   . Shellfish-Derived Products Hives, Nausea Only and Rash    Current Medications, Allergies, Past Medical History, Past Surgical History, Family History, and Social History were reviewed in [employer] record.    Review of Systems      See HPI - all other systems neg except as noted...      The patient denies anorexia, fever, weight loss, weight gain, vision loss, decreased hearing, hoarseness, chest pain, syncope, dyspnea on exertion, peripheral edema, prolonged cough, headaches, hemoptysis, abdominal pain, melena, hematochezia, severe indigestion/heartburn, hematuria, incontinence, muscle weakness, suspicious skin lesions, transient blindness, difficulty walking, depression, unusual weight change, abnormal bleeding, enlarged lymph nodes, and angioedema.     Objective:   Physical Exam    WD, WN, 79 y/o WF in NAD... GENERAL:  Alert & oriented; pleasant & cooperative... HEENT:  [place]/AT, EOM-wnl, PERRLA, EACs-clear, TMs-wnl, NOSE-sl drainage, THROAT-clear & wnl. NECK:  Supple w/ fairROM; no JVD; normal carotid impulses w/o bruits; no thyromegaly or nodules palpated; no lymphadenopathy. CHEST:  Clear to P & A; without wheezes/ rales/ or rhonchi heard... HEART:  Regular Rhythm; gr 1/6 SEM at LSB, no rubs, no gallops apprec... ABDOMEN:  Soft & nontender; normal bowel sounds; no organomegaly or masses detected. EXT: without deformities, mild arthritic changes; hx varicose veins/ +venous insuffic/ no edema. no muscle tenderness etc... NEURO:  CN's intact; no focal neuro deficits... DERM:  No lesions noted; no rash etc...  RADIOLOGY DATA:  Reviewed in the EPIC EMR & discussed w/ the patient...  LABORATORY DATA:  Reviewed in the EPIC EMR & discussed w/ the patient...   Assessment & Plan:    AR, Chronic Sinus Problems>  We reviewed regimen w/ Antihist Qam, Saline nasal mist every 1-2H during the day, Mucinex 2  tabsBid, & Flonase Qhs... She has now had both ENT & Allergy evals...  HBP>  Controlled on meds, continue same...  CAD>  Followed by DrWeintraub on meds listed; seen recently & he did labs according to the pt; copies pending.  Ven Insuffic>  She had VV surg in 2001; she knows to elim salt, elevate, wear support hose...  HYPERLIPID>  Improved on Simva40, labs here 1/14 looked good...  GI> HH, GERD, Divertics, Polyps>  Wants change to H2Blockers eg- ZANTAC Rx...  DJD, LBP>  Evaluated by Susann Givens in past; prev  hx partial T7-T8 compression...  Osteoporosis>  Managed by her GYN; on Calcium, MVI, Vit D; w/ the above compression she took Forteo in past but only for 8months...  Anxiety/ Depression>  On Wellbutrin for depression & requests refill;  On Lorazepam for nerves as well...   Patient's Medications  New Prescriptions   No medications on file  Previous Medications   AMLODIPINE (NORVASC) 10 MG TABLET    Take 1 tablet (10 mg total) by mouth daily.   ASPIRIN EC 81 MG TABLET    Take 81 mg by mouth daily.   AZELASTINE (ASTELIN) 137 MCG/SPRAY NASAL SPRAY    Place 1-2 sprays into the nose 2 (two) times daily as needed for rhinitis. Use in each nostril as directed   CALCIUM CARBONATE-VITAMIN D 600-400 MG-UNIT PER TABLET    Take 1 tablet by mouth 2 (two) times daily.     CHOLECALCIFEROL (VITAMIN D3) 2000 UNITS TABS    Take 2,000 Units by mouth daily.     DICYCLOMINE (BENTYL) 10 MG CAPSULE    Take 10 mg by mouth 2 (two) times daily as needed (for cramping and spasms.).   LANSOPRAZOLE (PREVACID) 15 MG CAPSULE    Take 1 capsule (15 mg total) by mouth 2 (two) times daily.   LORAZEPAM (ATIVAN) 1 MG TABLET    Take 0.5 mg by mouth at bedtime as needed for anxiety.   LOSARTAN (COZAAR) 50 MG TABLET    Take 1 tablet (50 mg total) by mouth daily.   METOPROLOL SUCCINATE (TOPROL XL) 25 MG 24 HR TABLET    Take 1 tablet (25 mg total) by mouth daily.   PROBIOTIC PRODUCT (ALIGN) 4 MG CAPS    Take 1 capsule by  mouth daily.   SIMETHICONE (MYLICON) 125 MG CHEWABLE TABLET    Chew 125 mg by mouth every 6 (six) hours as needed for flatulence.    SIMVASTATIN (ZOCOR) 40 MG TABLET    Take 40 mg by mouth every evening.  Modified Medications   No medications on file  Discontinued Medications   DOXYCYCLINE (VIBRA-TABS) 100 MG TABLET    Take 100 mg by mouth 2 (two) times daily. 10 day course of therapy started 05/17/12.   GUAIFENESIN (MUCINEX) 600 MG 12 HR TABLET    Take 1,200 mg by mouth 2 (two) times daily as needed for congestion.   PREDNISONE (STERAPRED UNI-PAK) 5 MG TABS    Take as directed on package   SULFAMETHOXAZOLE-TRIMETHOPRIM (SEPTRA DS) 800-160 MG PER TABLET    Take 1 tablet by mouth 2 (two) times daily.

## 2012-09-30 NOTE — Patient Instructions (Addendum)
Today we updated your med list in our EPIC system...    Continue your current medications the same...  Call for any questions...  Let's plan a follow up visit in 6mo, sooner if needed for problems...   

## 2012-10-25 ENCOUNTER — Telehealth: Payer: Self-pay | Admitting: Pulmonary Disease

## 2012-10-25 MED ORDER — LOSARTAN POTASSIUM 50 MG PO TABS: 50.0000 mg | ORAL_TABLET | Freq: Every day | ORAL | Status: DC

## 2012-10-25 MED ORDER — METOPROLOL SUCCINATE ER 25 MG PO TB24: 25.0000 mg | ORAL_TABLET | Freq: Every day | ORAL | Status: DC

## 2012-10-25 MED ORDER — SIMVASTATIN 40 MG PO TABS: 40.0000 mg | ORAL_TABLET | Freq: Every evening | ORAL | Status: DC

## 2012-10-25 NOTE — Telephone Encounter (Signed)
Refills sent and the pt is aware. Jennifer Castillo, CMA  

## 2012-11-11 ENCOUNTER — Telehealth: Payer: Self-pay | Admitting: Internal Medicine

## 2012-11-11 MED ORDER — METRONIDAZOLE 250 MG PO TABS: 250.0000 mg | ORAL_TABLET | Freq: Three times a day (TID) | ORAL | Status: DC

## 2012-11-11 NOTE — Telephone Encounter (Signed)
Metronidazole 250 mg tid x 10 days If not better in 5 days call back

## 2012-11-11 NOTE — Telephone Encounter (Signed)
Patient advised of Dr. Leone Payor orders, she will call back if no improvement in 5 days

## 2012-11-11 NOTE — Telephone Encounter (Signed)
Patient is having loose stools since Sunday. Symptoms similar to what she expereinced last year.  She was treated then with metronidazole.  She is requesting a refill.  Would this be ok?

## 2012-11-12 MED ORDER — METRONIDAZOLE 250 MG PO TABS: 250.0000 mg | ORAL_TABLET | Freq: Three times a day (TID) | ORAL | Status: DC

## 2012-11-12 NOTE — Addendum Note (Signed)
Addended by: Annett Fabian on: 11/12/2012 08:47 AM   Modules accepted: Orders

## 2012-11-15 ENCOUNTER — Other Ambulatory Visit: Payer: Self-pay | Admitting: *Deleted

## 2012-11-15 DIAGNOSIS — I447 Left bundle-branch block, unspecified: Secondary | ICD-10-CM

## 2012-11-16 ENCOUNTER — Other Ambulatory Visit: Payer: Self-pay | Admitting: Cardiovascular Disease

## 2012-11-16 LAB — COMPREHENSIVE METABOLIC PANEL
AST: 22 U/L (ref 0–37)
Albumin: 4.2 g/dL (ref 3.5–5.2)
BUN: 16 mg/dL (ref 6–23)
CO2: 30 mEq/L (ref 19–32)
Calcium: 9.2 mg/dL (ref 8.4–10.5)
Chloride: 106 mEq/L (ref 96–112)
Creat: 0.79 mg/dL (ref 0.50–1.10)
Glucose, Bld: 91 mg/dL (ref 70–99)
Potassium: 4.3 mEq/L (ref 3.5–5.3)

## 2012-11-16 LAB — CBC WITH DIFFERENTIAL/PLATELET
Eosinophils Relative: 3 % (ref 0–5)
Lymphocytes Relative: 39 % (ref 12–46)
Lymphs Abs: 2.9 10*3/uL (ref 0.7–4.0)
MCH: 30.2 pg (ref 26.0–34.0)
MCHC: 32.8 g/dL (ref 30.0–36.0)
Monocytes Absolute: 0.6 10*3/uL (ref 0.1–1.0)
Monocytes Relative: 8 % (ref 3–12)
Neutro Abs: 3.6 10*3/uL (ref 1.7–7.7)
Neutrophils Relative %: 50 % (ref 43–77)
Platelets: 239 10*3/uL (ref 150–400)
RBC: 4.83 MIL/uL (ref 3.87–5.11)
RDW: 14.1 % (ref 11.5–15.5)

## 2012-11-16 LAB — LIPID PANEL
Cholesterol: 142 mg/dL (ref 0–200)
HDL: 46 mg/dL (ref >39)
Triglycerides: 98 mg/dL (ref <150)

## 2012-11-16 LAB — TSH: TSH: 1.415 u[IU]/mL (ref 0.350–4.500)

## 2012-11-18 ENCOUNTER — Telehealth: Payer: Self-pay | Admitting: Internal Medicine

## 2012-11-18 ENCOUNTER — Ambulatory Visit (HOSPITAL_COMMUNITY)
Admission: RE | Admit: 2012-11-18 | Discharge: 2012-11-18 | Disposition: A | Discharge status: Home / Self Care | Payer: Medicare Other | Source: Ambulatory Visit | Attending: Internal Medicine | Admitting: Internal Medicine

## 2012-11-18 DIAGNOSIS — R0609 Other forms of dyspnea: Secondary | ICD-10-CM | POA: Insufficient documentation

## 2012-11-18 DIAGNOSIS — R0989 Other specified symptoms and signs involving the circulatory and respiratory systems: Secondary | ICD-10-CM | POA: Insufficient documentation

## 2012-11-18 DIAGNOSIS — I447 Left bundle-branch block, unspecified: Secondary | ICD-10-CM

## 2012-11-18 DIAGNOSIS — R011 Cardiac murmur, unspecified: Secondary | ICD-10-CM | POA: Insufficient documentation

## 2012-11-18 HISTORY — PX: TRANSTHORACIC ECHOCARDIOGRAM: SHX275

## 2012-11-18 NOTE — Progress Notes (Signed)
2D Echo Performed 11/18/2012    Thorsten Climer, RCS  

## 2012-11-18 NOTE — Telephone Encounter (Signed)
Patient is advised to finish metronidazole that she started on last week, she stopped it earlier in the week.  She is advised to keep the appt on 12/22/12 11:00

## 2012-11-24 ENCOUNTER — Encounter: Payer: Self-pay | Admitting: Cardiovascular Disease

## 2012-12-10 ENCOUNTER — Encounter: Payer: Self-pay | Admitting: *Deleted

## 2012-12-22 ENCOUNTER — Ambulatory Visit: Payer: Medicare Other | Admitting: Internal Medicine

## 2013-01-06 ENCOUNTER — Other Ambulatory Visit: Payer: Self-pay | Admitting: Obstetrics and Gynecology

## 2013-01-06 ENCOUNTER — Ambulatory Visit (INDEPENDENT_AMBULATORY_CARE_PROVIDER_SITE_OTHER): Payer: Medicare Other

## 2013-01-06 DIAGNOSIS — M810 Age-related osteoporosis without current pathological fracture: Secondary | ICD-10-CM

## 2013-01-06 DIAGNOSIS — Z23 Encounter for immunization: Secondary | ICD-10-CM

## 2013-03-04 ENCOUNTER — Ambulatory Visit
Admission: RE | Admit: 2013-03-04 | Discharge: 2013-03-04 | Disposition: A | Discharge status: Home / Self Care | Payer: Medicare Other | Source: Ambulatory Visit | Attending: Obstetrics and Gynecology | Admitting: Obstetrics and Gynecology

## 2013-03-04 DIAGNOSIS — M810 Age-related osteoporosis without current pathological fracture: Secondary | ICD-10-CM

## 2013-03-11 ENCOUNTER — Telehealth: Payer: Self-pay | Admitting: Pulmonary Disease

## 2013-03-11 NOTE — Telephone Encounter (Signed)
ATC PT NA WCB 

## 2013-03-11 NOTE — Telephone Encounter (Signed)
Per SN-Night time leg cramping can be a tough problem; [facility] recommends 1 glass tonic water at bedtime vs 1 tsp yellow mustard at bedtime; they even swear by the old bar of soap in the bed trick.   Shanda Bumps called and spoke with patient about the above. Pt stated she already started taking tonic water and uses old trick of bar of soap in bed; will see how that works first. Nothing more needed.

## 2013-03-11 NOTE — Telephone Encounter (Signed)
Pt returned call stating that she has been having some cramping and "drawing up" of her left foot/ankle x3-4 days.  This only happens at night.  Pt denies any symptoms during the day and walked for about 45 minutes today without any issues.  Denies any swelling, redness or heat in the area, or pain.  Pt was concerned about a return of phlebitis, but advised this is unlikely with no swelling, redness, heat, or pain. Pt does eat bananas but has not had any in the last couple of days.  Dr Kriste Basque please advise, thank you.

## 2013-03-25 ENCOUNTER — Telehealth: Payer: Self-pay | Admitting: Pulmonary Disease

## 2013-03-25 NOTE — Telephone Encounter (Signed)
Spoke with pt and appt scheduled for pt. Nothing further needed 

## 2013-03-29 ENCOUNTER — Telehealth: Payer: Self-pay | Admitting: Pulmonary Disease

## 2013-03-29 MED ORDER — ONDANSETRON HCL 4 MG PO TABS: 4.0000 mg | ORAL_TABLET | ORAL | Status: DC | PRN

## 2013-03-29 MED ORDER — PROMETHAZINE HCL 25 MG PO TABS: ORAL_TABLET | ORAL | Status: DC

## 2013-03-29 MED ORDER — DICYCLOMINE HCL 10 MG PO CAPS: 10.0000 mg | ORAL_CAPSULE | Freq: Four times a day (QID) | ORAL | Status: DC | PRN

## 2013-03-29 NOTE — Telephone Encounter (Signed)
Please advise as zofran 4 mg is not covered as rec from earlier phone note. Thanks  Allergies  Allergen Reactions  . Alendronate Sodium     REACTION: pt states INTOL to Fosamax w/ esophagitis  . Cefdinir     REACTION: diarrhea  . Esomeprazole Magnesium     REACTION: pt states INTOL to Nexium  . Levofloxacin     REACTION: diarrhea  . Omeprazole     REACTION: pt states INTOL to Prilosec  . Other     Pneumonia vaccine---felt tired and achy and sick x 6 months to get over this.  . Penicillins   . Shellfish-Derived Products Hives, Nausea Only and Rash

## 2013-03-29 NOTE — Telephone Encounter (Signed)
Per SN: phenergan 25 mg 1/2-1 tab q4hrs prn #12  Spoke with pt and aware of recs. Nothing further needed

## 2013-03-29 NOTE — Telephone Encounter (Signed)
Pt is aware of SN recs. Rx's have been sent to the pharmacy.

## 2013-03-29 NOTE — Telephone Encounter (Signed)
Per SN---  Clear liquid diet zofran 4 mg  #25  1 po every 4 hours as needed for nausea Bentyl 10 mg  #25  1 po every 6 hours as needed for abd cramping immodium prn for diarrhea Align once daily

## 2013-03-29 NOTE — Telephone Encounter (Signed)
Last visit 10-05-12. Pt is c/o having nausea, stomach cramps and diarrhea x 1 day. She states she has been taking imodium and Pepto without relief. She denies any fever, vomiting, abdominal pain. Please advise. Carron Curie, CMA Allergies  Allergen Reactions  . Alendronate Sodium     REACTION: pt states INTOL to Fosamax w/ esophagitis  . Cefdinir     REACTION: diarrhea  . Esomeprazole Magnesium     REACTION: pt states INTOL to Nexium  . Levofloxacin     REACTION: diarrhea  . Omeprazole     REACTION: pt states INTOL to Prilosec  . Other     Pneumonia vaccine---felt tired and achy and sick x 6 months to get over this.  . Penicillins   . Shellfish-Derived Products Hives, Nausea Only and Rash

## 2013-04-05 ENCOUNTER — Ambulatory Visit: Payer: Medicare Other | Admitting: Pulmonary Disease

## 2013-04-28 ENCOUNTER — Ambulatory Visit (INDEPENDENT_AMBULATORY_CARE_PROVIDER_SITE_OTHER): Payer: Medicare Other | Admitting: Pulmonary Disease

## 2013-04-28 ENCOUNTER — Encounter: Payer: Self-pay | Admitting: Pulmonary Disease

## 2013-04-28 VITALS — BP 140/76 | HR 71 | Temp 97.9°F | Ht 64.0 in | Wt 164.0 lb

## 2013-04-28 DIAGNOSIS — M1990 Unspecified osteoarthritis, unspecified site: Secondary | ICD-10-CM

## 2013-04-28 DIAGNOSIS — E785 Hyperlipidemia, unspecified: Secondary | ICD-10-CM

## 2013-04-28 DIAGNOSIS — I2510 Atherosclerotic heart disease of native coronary artery without angina pectoris: Secondary | ICD-10-CM

## 2013-04-28 DIAGNOSIS — N281 Cyst of kidney, acquired: Secondary | ICD-10-CM

## 2013-04-28 DIAGNOSIS — K5730 Diverticulosis of large intestine without perforation or abscess without bleeding: Secondary | ICD-10-CM

## 2013-04-28 DIAGNOSIS — I10 Essential (primary) hypertension: Secondary | ICD-10-CM

## 2013-04-28 DIAGNOSIS — M810 Age-related osteoporosis without current pathological fracture: Secondary | ICD-10-CM

## 2013-04-28 DIAGNOSIS — K219 Gastro-esophageal reflux disease without esophagitis: Secondary | ICD-10-CM

## 2013-04-28 DIAGNOSIS — I872 Venous insufficiency (chronic) (peripheral): Secondary | ICD-10-CM

## 2013-04-28 DIAGNOSIS — F411 Generalized anxiety disorder: Secondary | ICD-10-CM

## 2013-04-28 DIAGNOSIS — K589 Irritable bowel syndrome without diarrhea: Secondary | ICD-10-CM

## 2013-04-28 MED ORDER — LORAZEPAM 1 MG PO TABS: 0.5000 mg | ORAL_TABLET | Freq: Every evening | ORAL | Status: DC | PRN

## 2013-04-28 NOTE — Patient Instructions (Signed)
Today we updated your med list in our EPIC system...    Continue your current medications the same...  We discussed taking the Lorazepam at night to help you rest & consider the Tonic water for the leg cramps...  Call for any questions...  It has been a pleasure to serve you & your family, please let us know who you have chosen for primary care going forward.Marland KitchenMarland Kitchen

## 2013-04-28 NOTE — Progress Notes (Signed)
Subjective:    Patient ID: Krystal [name], female    DOB: Aug 12, 1929, 80 y.o.   MRN: [id number]  HPI 80 y/o WF here for a follow up visit... she has multiple medical problems as noted below...   ~  October 08, 2011:  52mo ROV & Krystal [name] is stable w/o new complaints or concerns; she had some sinus congestion but the recent rain has helped...    She saw GI 6/13 w/ chr GERD controlled on Prevacid; hx colon polyps w/ colon 4/10 showing divertics & one sm adenoma removed; c/o abd pain, incr BMs & mucus in stool> treated w/ Cipro, Bentyl, Align and improved overall...    She saw the Urology PA 5/13> f/u renal cysts & she uses Estrace cream w/ improved lower tract symptoms; Sonar was essentially unchanged, she voided well, no signif PVR; no meds- rec f/u one yr...    We reviewed prob list, meds, xrays and labs> see below for updates>> LABS 7/13:  FLP- no at goals on Simva20 w/ TG=151 LDL 111...  ~  April 13, 2012:  17mo ROV & [place] persists w/ complaints of sinus drainage, some cough, etc; she's been using the Astelin, Saline, & Zyrtek (hx intol to [place]) and has seen Allergist & ENT in the past; offered change to [facility] but she felt everything is about stable & content to continue same Rx... We reviewed the following medical problems during today's office visit >> she did not bring her med bottles to review >>    HBP> on MetopER25, Amlod10, Losar50; BP= 132/70 & she denies CP, palpit, dizzy, SOB, edema...    CAD> on ASA81; followed by DrRW- seen 8/13 & stable w/o angina, he increased her Simva20=>40mg /d...    VenInsuffic> on low sodium diet, not requiring diuretics; she knows to elev legs, wear support hose prn...    Hyperlipid> on Simva40 now; FLP on this dose shows TChol 161, TG 133, HDL 44, LDL 91    GI- GERD, Divertics, IBS, Polyps> on Prev15Bid, Bentyl20mg  prn, Align daily; states she is doing well w/o abd pain, n/v, c/d, blood seen...    DJD, LBP> on Mobic7.5 prn; all symptoms improved & exercising  daily...     Osteoporosis> followed & managed by Gyn- part T7&8 compression fx, prev Rx w/ Forteo, now on Calcium, MVI, VitD; she reports that Drfogelman treated her w/ Prolia but ?she states she only takes it when she needs it?    Anxiety> on Ativan 1mg - takes it Qhs for sleep... We reviewed prob list, meds, xrays and labs> see below for updates >> she had the Flu shot in Oct 7 Rx written for Shingles vaccine should she decide to get it... LABS 1/14:  FLP- at goals on Simva40;  Chems- wnl;  CBC- wnl;  TSH=0.94;  VitD=41 on 2000u/d...  ~  September 30, 2012:  11mo ROV and Krystal [name] reports doing well- no new complaints or concerns; she reports being started on Prolia shots Q32mo per Gyn for her osteoporosis + compression fxs; We reviewed the following medical problems during today's office visit >>     HBP> on MetopER25, Amlod10, Losar50; BP= 128/80 & she denies CP, palpit, dizzy, SOB, edema...    CAD> on ASA81; followed by DrRW- seen 8/13 & stable w/o angina, he increased her Simva20=>40mg /d...    VenInsuffic> on low sodium diet, not requiring diuretics; she knows to elev legs, wear support hose prn...    Hyperlipid> on Simva40 now; FLP 1/14 showed TChol 161, TG 133,  HDL 44, LDL 91    GI- GERD, Divertics, IBS, Polyps> on Prev15Bid, Bentyl10mg  prn, Align daily; states she is doing well w/o abd pain, n/v, c/d, blood seen...    DJD, LBP> on Mobic7.5 prn; all symptoms improved & exercising daily...     Osteoporosis> followed & managed by Gyn- part T7&8 compression fx, prev Rx w/ Forteo, now on Calcium, MVI, VitD; she reports that DrFogelman treated her w/ Prolia and now Q43mo...    Anxiety> on Ativan 1mg - takes it Qhs for sleep as needed... We reviewed prob list, meds, xrays and labs> see below for updates >>   ~  April 28, 2013:  76mo ROV & Krystal [name] is oerall stable, her CC= insomnia & she has Lorazepam to use prn; it may partly be due to legs cramping at night but she says aspercream helps and she doesn't want new  meds, rec to try tonic water...     BP controlled on ToprolXL25, Amlod10, Losar50; BP= 140/76 & she denies CP, palpit, SOB, edema, etc...     She was followed by DrRW at [facility] until his retirement; she will estab w/ new Cards in their officew soon...     Gi is stable & followed by Rance Muir- his p[rev notes reviewed...    She has DJD, LBP, Osteoporosis- on Prolia Q34mo from [place]... We reviewed prob list, meds, xrays and labs> see below for updates >> SHE WILL ESTABLISH w/ DrFRY FOR PRIMARY CARE...            PROBLEM LIST:  ALLERGIC RHINITIS (ICD-477.9) - she uses OTC antihistamines + saline nasal spray Prn... "I have alot of mucous"- Rx w/ antihist, mucinex, cough syrups (wants Tussionex)... she had some left ear discomfort, dry skin & DrBates suggested mineral oil drop or two on occas Prn. ~  10/12:  She saw DrByers for ENT eval- neg w/ normal CT sinuses;  She saw DrESL for allergy eval & ?result of skin tests but not rec for allergy shots- given saline wash, Claritin, Flonase, Patanase, Medrol 4mg  prolonged course... ~  We discussed Rx w/ Antihist in AM, spray Saline Q1-2H during the day, take Mucinex 1-2 Bid w/ Fluids and use the Flonase Qhs;  we also agreed to Rx acute symptoms w/ Sterapred dosepak... ~  3/14: she had f/u DrVanWinkle> treated for sinusitis w/ Omnicef, & her allergies w/ Depo, Claritin, Flonase, Patanase...  HYPERTENSION (ICD-401.9) - controlled on TOPROL XL 25mg /d, NORVASC 10mg /d, & LOSARTAN 50/d... ~  CXR 12/09 showed some hyperaeration w/ flattening of diaphragms, NAD.Marland Kitchen. ~  8/12: BP= 136/78 here today & even better at home- feeling well, tolerating meds well; denies HA, fatigue, visual changes, CP, palipit, dizziness, syncope, dyspnea, edema, etc... ~  CXR 1/13 showed heart at the upper lim of norm, tortuous/ ectatic Ao, underlying COPD w/ mild basilar scarring, osteopenia w/ compression fxs, degen spondylosis... ~  1/13: BP= 128/64 & she is essent asymptomatic... ~  7/13:   BP= 122/84 & she denies CP, palpit, dizzy, SOB, edema, etc... ~  1/14: on MetopER25, Amlod10, Losar50; BP= 132/70 & she denies CP, palpit, dizzy, SOB, edema...  CORONARY ARTERY DISEASE (ICD-414.00) - followed by DrWeintraub & [facility]... on the above + ASA 81mg /d, & Statin for her Chol... she denies CP, palpit, change in SOB, edema, etc; she exercises by walking... ~  baseline EKG shows a chr LBBB... ~  cath 10/03 w/ single vessel CAD- 90%CIRC treated w/ PTCA/ stent... ~  Cardiolite 10/06 showed no ischemia... repeat 12/08 showed  no change- no ischemia, +diaph attenuation, EF= 64% w/ LBBB... ~  2DEcho 12/08 showed mild conc LVH, mild calcif AoV, no stenosis, RVsys sl up at 40-50 mmHg... ~  2DEcho 4/10 showed borderline conc LVH, norm LVF w/ EF= 55%, mild LA dil, mod MR, RVsys=42... ~  8/12:  She tells me that she saw DrRW last week & he did LABS & ECHO ==> pending. ~  8/13:  She saw DrRW for f/u HBP, CAD, LBBB, Hyperlipid; cath in 2003 w/ 90%LAD w/ stenting & doing well w/o angina; Myoview 2008 w/o ischemia; 2DEcho 8/12 w/ mild conc LVH, no valv dis, norm LVF, & mild DD... He increased her Simva20=>40mg /d...  VENOUS INSUFFICIENCY (ICD-459.81) - she knows to elim sodium, elevate legs, wear support hose (she is not on a diuretic)... she had VV surg in 2001 by [facility]... ~  She had Ven Dopplers per DrBlackman 3/13> neg for DVT  HYPERLIPIDEMIA (ICD-272.4) -  insurance company will not cover Crestor, only generics... she notes musc "cramps" on mult statins prev... now on SIMVASTATIN 20mg /d w/ some symptoms but tolerable... ~  [place] 8/09 on Crestor10 = TChol 154, Tg 132, HDL 41, LDL 86 (goal is <70 for secondary prevent). ~  FLP 9/10 on Prav20 showed TChol 211, TG 136, HDL 37, LDL 160... rec> try to incr Prav to 40mg /d> intol. ~  FLP 2/11 on Simva20 showed TChol 168, TG 123, HDL 50, LDL 94... continue same. ~  FLP 11/11 on Simva20 showed TChol 146, TG 120, HDL 38, LDL 84 ~  FLP 7/13 on Simva20 showed TChol  190, TG 151, HDL 48, LDL 111 ~  FLP 1/14 on simva40 showed TChol 161, TG 133, HDL 44, LDL 91   HIATAL HERNIA (ICD-553.3) & GERD (ICD-530.81) - followed by DrGessner & rec to incr PREVACID to 30mg /d; but she says "DrBates scared me & says I shouldn't be taking the Prevacid" therefore she wants ZANTAC 150mg  Bid... ~  last EGD 1/98 showed 3-4cmHH, mod reflux esophagitis, mild duodenitis...  ~  12/10:  seen by GI w/ GERD & bloating- Rx w/ Pantoprazole & Bentyl... ~  4/12: Hx GERD, c/o intermittent dysphagia, & EGD showed 3cmHH, otherw neg, no stricture found; rec to incr Prevacid to 30mg /d, but she wants top change to [facility] as noted above... ~  1/14: on Prev15Bid, Bentyl20mg  prn, Align daily; states she is doing well w/o abd pain, n/v, c/d, blood seen.   DIVERTICULOSIS OF COLON (ICD-562.10) & COLONIC POLYPS (ICD-211.3) - on BENTYL 20mg  Prn. ~  colonoscopy 6/05 by DrSam showed divertics and two 4-45mm polyps= adenomatous... ~  colonosco[py 5/10 by DrGessner showed divertics, hems, tiny cecal polyp= adenoma... f/u planned 51yrs.  RENAL CYST (ICD-593.2) - [facility]) and sonars have been stable... ~  She saw the Urology PA 5/13> f/u renal cysts & she uses Estrace cream w/ improved lower tract symptoms; Sonar was essentially unchanged, she voided well, no signif PVR; no meds- rec f/u one yr... ~  6/14: she had f/u DrWrenn> chr cystitis, atrophic vaginitis, kid stones, renal cyst; on Estrace cream & periodic sonars; stable & f/u 48yr...  OSTEOARTHRITIS (ICD-715.90) - eval by Shara Blazing 2/09 w/ stress fracture to 4th metatarsal left foot... she takes MOBIC 7.5mg  Prn for hand arthritis & prev saw DrMyerdierks for this... she notes that the Mobic helps her BP!  LOW BACK PAIN SYNDROME (ICD-724.2) - she states that her prev LBP has resolved and she is active w/  exercise 3-4 days per week... she had a partial T7 & T8 compression fractures noted in 2003... she took  Forteo but only for 8 months per her GYN.  OSTEOPOROSIS (ICD-733.00) - as above>  this is managed and followed by her GYN Laurin Coder, NP) w/ BMD's and currently on calcium and VitD 2000 daily... we do not have copies of her recent BMD's... ~  1/14: followed & managed by Gyn- part T7&8 compression fx, prev Rx w/ Forteo, now on Calcium, MVI, VitD; she reports that DrFogelman treated her w/ Prolia & now takes it Q78mo...  HEADACHE (ICD-784.0)  ANXIETY (ICD-300.00) - on LORAZEPAM 1mg - 1/2 to 1 tab three times a day as needed.  Hx of ANEMIA - hx of anemia in the past... Hg= 14.6 on 11/09/07... Hg= 14.4 on 04/08/11...  Health Maintenance - she had Pneumovax in the past w/ a reaction that she indicates lasted for 55yr... ~  Mammogram 5/13 was neg, & f/u rec in 15yr...   Past Surgical History  Procedure Laterality Date  . Abdominal hysterectomy    . Varicose vein surgery    . Bladder repair    . Colonoscopy  08/17/2008    adenomatous polyp, diverticulosis, external hemorrhoids  . Cataract extraction      bilateral  . Upper gastrointestinal endoscopy  07/03/2010    hiatal hernia  . Colonoscopy w/ biopsies      multiple      Outpatient Encounter Prescriptions as of 04/28/2013  Medication Sig  . amLODipine (NORVASC) 10 MG tablet Take 1 tablet (10 mg total) by mouth daily.  Marland Kitchen aspirin EC 81 MG tablet Take 81 mg by mouth daily.  . Calcium Carbonate-Vitamin D 600-400 MG-UNIT per tablet Take 1 tablet by mouth 2 (two) times daily.    . Cholecalciferol (VITAMIN D3) 2000 UNITS TABS Take 2,000 Units by mouth daily.    Marland Kitchen dicyclomine (BENTYL) 10 MG capsule Take 1 capsule (10 mg total) by mouth every 6 (six) hours as needed (for cramping and spasms.).  Marland Kitchen fluticasone (FLONASE) 50 MCG/ACT nasal spray Place 2 sprays into both nostrils daily.  . lansoprazole (PREVACID) 15 MG capsule Take 1 capsule (15 mg total) by mouth 2 (two) times daily.  Marland Kitchen LORazepam (ATIVAN) 1 MG tablet Take 0.5 mg by mouth at bedtime as  needed for anxiety.  Marland Kitchen losartan (COZAAR) 50 MG tablet Take 1 tablet (50 mg total) by mouth daily.  . metoprolol succinate (TOPROL XL) 25 MG 24 hr tablet Take 1 tablet (25 mg total) by mouth daily.  . ondansetron (ZOFRAN) 4 MG tablet Take 1 tablet (4 mg total) by mouth every 4 (four) hours as needed for nausea or vomiting.  . Probiotic Product (ALIGN) 4 MG CAPS Take 1 capsule by mouth daily.  . promethazine (PHENERGAN) 25 MG tablet 1/2-1 tab po every 4 hours as needed  . simethicone (MYLICON) 161 MG chewable tablet Chew 125 mg by mouth every 6 (six) hours as needed for flatulence.   . simvastatin (ZOCOR) 40 MG tablet Take 1 tablet (40 mg total) by mouth every evening.  . [DISCONTINUED] azelastine (ASTELIN) 137 MCG/SPRAY nasal spray Place 1-2 sprays into the nose 2 (two) times daily as needed for rhinitis. Use in each nostril as directed  . [DISCONTINUED] metroNIDAZOLE (FLAGYL) 250 MG tablet Take 1 tablet (250 mg total) by mouth 3 (three) times daily.    Allergies  Allergen Reactions  . Alendronate Sodium     REACTION: pt states INTOL to Fosamax w/  esophagitis  . Cefdinir     REACTION: diarrhea  . Esomeprazole Magnesium     REACTION: pt states INTOL to Nexium  . Levofloxacin     REACTION: diarrhea  . Omeprazole     REACTION: pt states INTOL to Prilosec  . Other     Pneumonia vaccine---felt tired and achy and sick x 6 months to get over this.  . Penicillins   . Shellfish-Derived Products Hives, Nausea Only and Rash    Current Medications, Allergies, Past Medical History, Past Surgical History, Family History, and Social History were reviewed in [employer] record.    Review of Systems      See HPI - all other systems neg except as noted...      The patient denies anorexia, fever, weight loss, weight gain, vision loss, decreased hearing, hoarseness, chest pain, syncope, dyspnea on exertion, peripheral edema, prolonged cough, headaches, hemoptysis, abdominal  pain, melena, hematochezia, severe indigestion/heartburn, hematuria, incontinence, muscle weakness, suspicious skin lesions, transient blindness, difficulty walking, depression, unusual weight change, abnormal bleeding, enlarged lymph nodes, and angioedema.     Objective:   Physical Exam    WD, WN, 80 y/o WF in NAD... GENERAL:  Alert & oriented; pleasant & cooperative... HEENT:  [place]/AT, EOM-wnl, PERRLA, EACs-clear, TMs-wnl, NOSE-sl drainage, THROAT-clear & wnl. NECK:  Supple w/ fairROM; no JVD; normal carotid impulses w/o bruits; no thyromegaly or nodules palpated; no lymphadenopathy. CHEST:  Clear to P & A; without wheezes/ rales/ or rhonchi heard... HEART:  Regular Rhythm; gr 1/6 SEM at LSB, no rubs, no gallops apprec... ABDOMEN:  Soft & nontender; normal bowel sounds; no organomegaly or masses detected. EXT: without deformities, mild arthritic changes; hx varicose veins/ +venous insuffic/ no edema. no muscle tenderness etc... NEURO:  CN's intact; no focal neuro deficits... DERM:  No lesions noted; no rash etc...  RADIOLOGY DATA:  Reviewed in the EPIC EMR & discussed w/ the patient...  LABORATORY DATA:  Reviewed in the EPIC EMR & discussed w/ the patient...   Assessment & Plan:    AR, Chronic Sinus Problems>  We reviewed regimen w/ Antihist Qam, Saline nasal mist every 1-2H during the day, Mucinex 2 tabsBid, & Flonase Qhs... She has now had both ENT & Allergy evals...  HBP>  Controlled on meds, continue same...  CAD>  Followed by DrWeintraub on meds listed; seen recently & he did labs according to the pt; copies pending.  Ven Insuffic>  She had VV surg in 2001; she knows to elim salt, elevate, wear support hose...  HYPERLIPID>  Improved on Simva40, labs here 1/14 looked good...  GI> HH, GERD, Divertics, Polyps>  Wants change to H2Blockers eg- ZANTAC Rx...  DJD, LBP>  Evaluated by Shara Blazing in past; prev hx partial T7-T8 compression...  Osteoporosis>  Managed by her GYN; on  Calcium, MVI, Vit D; w/ the above compression she took Forteo in past but only for 49months...  Anxiety/ Depression>  On Wellbutrin for depression & requests refill;  On Lorazepam for nerves as well.Marland KitchenMarland Kitchen

## 2013-06-06 ENCOUNTER — Telehealth: Payer: Self-pay | Admitting: Pulmonary Disease

## 2013-06-06 NOTE — Telephone Encounter (Signed)
Received notice from [employer] stating that pt's Lorazepam 1mg  is requiring PA Called the number provided [phone] and was on hold [phone] waiting to speak with a nurse to determine coverage Spoke with Adonis Brook, coverage determination nurse and questions Per Adonis Brook, [employer] will be in touch about decision Case # is pt's ID # [id number]

## 2013-06-07 NOTE — Telephone Encounter (Signed)
Katrina called from [employer] and said that mrs lowery's medication for lorazepam has been approved should you have any more questions pls call here at [phone].Hillery Hunter

## 2013-06-07 NOTE — Telephone Encounter (Signed)
Spoke with pharmacy to advise, rx was filled and pt is aware.  Bing, CMA

## 2013-06-08 ENCOUNTER — Telehealth: Payer: Self-pay | Admitting: Family Medicine

## 2013-06-08 ENCOUNTER — Encounter: Payer: Self-pay | Admitting: Family Medicine

## 2013-06-08 ENCOUNTER — Ambulatory Visit (INDEPENDENT_AMBULATORY_CARE_PROVIDER_SITE_OTHER): Payer: Medicare Other | Admitting: Family Medicine

## 2013-06-08 VITALS — BP 126/80 | HR 95 | Temp 99.1°F | Ht 65.25 in | Wt 162.0 lb

## 2013-06-08 DIAGNOSIS — M1990 Unspecified osteoarthritis, unspecified site: Secondary | ICD-10-CM

## 2013-06-08 DIAGNOSIS — I10 Essential (primary) hypertension: Secondary | ICD-10-CM

## 2013-06-08 DIAGNOSIS — K219 Gastro-esophageal reflux disease without esophagitis: Secondary | ICD-10-CM

## 2013-06-08 DIAGNOSIS — I2510 Atherosclerotic heart disease of native coronary artery without angina pectoris: Secondary | ICD-10-CM

## 2013-06-08 DIAGNOSIS — F411 Generalized anxiety disorder: Secondary | ICD-10-CM

## 2013-06-08 DIAGNOSIS — E785 Hyperlipidemia, unspecified: Secondary | ICD-10-CM

## 2013-06-08 MED ORDER — MELOXICAM 7.5 MG PO TABS: 7.5000 mg | ORAL_TABLET | Freq: Every day | ORAL | Status: DC | PRN

## 2013-06-08 NOTE — Progress Notes (Signed)
   Subjective:    Patient ID: Krystal [name], female    DOB: 1929/05/22, 80 y.o.   MRN: [id number]  HPI Here to establish after transfering form Dr. Lenna Gilford. She is doing well and has no concerns. She checks her BP at home frequently and it is stable. She [place] at the mall for an hour about 4 days a week. She had seen Dr. Rollene Fare once a year, the last time being last August.    Review of Systems  Constitutional: Negative.   HENT: Negative.   Respiratory: Negative.   Cardiovascular: Negative.   Neurological: Negative.        Objective:   Physical Exam  Constitutional: She is oriented to person, place, and time. She appears well-developed and well-nourished.  Neck: No thyromegaly present.  Cardiovascular: Normal rate, regular rhythm, normal heart sounds and intact distal pulses.   Pulmonary/Chest: Effort normal and breath sounds normal.  Lymphadenopathy:    She has no cervical adenopathy.  Neurological: She is alert and oriented to person, place, and time.          Assessment & Plan:  She seems to be doing well. We will arrange for her to see one of Dr. Lowella Fairy former partners later this summer.

## 2013-06-08 NOTE — Progress Notes (Signed)
Pre visit review using our clinic review tool, if applicable. No additional management support is needed unless otherwise documented below in the visit note. 

## 2013-06-08 NOTE — Telephone Encounter (Signed)
Relevant patient education mailed to patient.  

## 2013-07-11 ENCOUNTER — Other Ambulatory Visit: Payer: Self-pay

## 2013-07-11 MED ORDER — LANSOPRAZOLE 15 MG PO CPDR: 15.0000 mg | DELAYED_RELEASE_CAPSULE | Freq: Two times a day (BID) | ORAL | Status: DC

## 2013-07-13 ENCOUNTER — Telehealth: Payer: Self-pay

## 2013-07-13 NOTE — Telephone Encounter (Signed)
Spoke with Krystal [name] at [facility] # [phone], he faxed over a form which I filled out and faxed to [phone] for a drug quantity limitations request for Lansoprazole 15mg , Krystal [name] takes one capsule by mouth twice a day.  Dx-GERD 530.81.  We will await a response.

## 2013-07-15 NOTE — Telephone Encounter (Signed)
Krystal [name] with [facility] [employer] called to inform us that her Lansoprazole has been approved, they will notify patient by mail.

## 2013-07-18 ENCOUNTER — Encounter: Payer: Self-pay | Admitting: Cardiovascular Disease

## 2013-07-18 ENCOUNTER — Ambulatory Visit (INDEPENDENT_AMBULATORY_CARE_PROVIDER_SITE_OTHER): Payer: Medicare Other | Admitting: Cardiovascular Disease

## 2013-07-18 VITALS — BP 138/84 | HR 70 | Ht 65.0 in | Wt 163.2 lb

## 2013-07-18 DIAGNOSIS — E785 Hyperlipidemia, unspecified: Secondary | ICD-10-CM

## 2013-07-18 DIAGNOSIS — I2510 Atherosclerotic heart disease of native coronary artery without angina pectoris: Secondary | ICD-10-CM

## 2013-07-18 DIAGNOSIS — I10 Essential (primary) hypertension: Secondary | ICD-10-CM

## 2013-07-18 NOTE — Patient Instructions (Signed)
Your physician wants you to follow-up in: 1 year with Dr Gwenlyn Found. You will receive a reminder letter in the mail two months in advance. If you don't receive a letter, please call our office to schedule the follow-up appointment.  I will mail you a lab slip at the end of July to have your cholesterol checked in August.  This needs to be done fasting

## 2013-07-18 NOTE — Progress Notes (Signed)
07/18/2013 Krystal [name]   05-28-1929  [id number]  Primary Physician Laurey Morale, MD Primary Cardiologist: Lorretta Harp MD Renae Gloss   HPI:  Krystal [name] is a 80 year old mildly overweight married Caucasian female mother of 2 children, grandmother of 3 grandchildren, whose husband all is also a patient of mine. They were both totally patient of Dr. Georgiann Mccoy. Her past history is remarkable for treated hypertension and hyperlipidemia. Her father did die of a myocardial infarction. She has never had a heart attack or stroke. She does not smoke. She had a Cypher drug-eluting stent placed in her circumflex coronary artery by Dr. Eustace Quail in 2003. Her other arteries were normal as was her LV function. She denies chest pain or shortness of breath. She is very active and walks for an hour every morning. She has noticed some evening and ankle edema.   Current Outpatient Prescriptions  Medication Sig Dispense Refill  . amLODipine (NORVASC) 10 MG tablet Take 1 tablet (10 mg total) by mouth daily.  90 tablet  3  . aspirin EC 81 MG tablet Take 81 mg by mouth daily.      . Calcium Carbonate-Vitamin D 600-400 MG-UNIT per tablet Take 1 tablet by mouth 2 (two) times daily.        . Cholecalciferol (VITAMIN D3) 2000 UNITS TABS Take 2,000 Units by mouth daily.        Marland Kitchen dicyclomine (BENTYL) 10 MG capsule Take 1 capsule (10 mg total) by mouth every 6 (six) hours as needed (for cramping and spasms.).  25 capsule  0  . fluocinonide cream (LIDEX) 4.09 % Apply 1 application topically daily.      . fluticasone (FLONASE) 50 MCG/ACT nasal spray Place 2 sprays into both nostrils daily as needed.       . lansoprazole (PREVACID) 15 MG capsule Take 1 capsule (15 mg total) by mouth 2 (two) times daily.  180 capsule  0  . LORazepam (ATIVAN) 1 MG tablet Take 0.5 tablets (0.5 mg total) by mouth at bedtime as needed for anxiety.  30 tablet  5  . losartan (COZAAR) 50 MG tablet Take 1 tablet  (50 mg total) by mouth daily.  90 tablet  3  . meloxicam (MOBIC) 7.5 MG tablet Take 1 tablet (7.5 mg total) by mouth daily as needed for pain.  90 tablet  3  . metoprolol succinate (TOPROL XL) 25 MG 24 hr tablet Take 1 tablet (25 mg total) by mouth daily.  90 tablet  3  . ondansetron (ZOFRAN) 4 MG tablet Take 1 tablet (4 mg total) by mouth every 4 (four) hours as needed for nausea or vomiting.  25 tablet  0  . Probiotic Product (ALIGN) 4 MG CAPS Take 1 capsule by mouth daily.      . promethazine (PHENERGAN) 25 MG tablet 1/2-1 tab po every 4 hours as needed  12 tablet  0  . simethicone (MYLICON) 811 MG chewable tablet Chew 125 mg by mouth every 6 (six) hours as needed for flatulence.       . simvastatin (ZOCOR) 40 MG tablet Take 1 tablet (40 mg total) by mouth every evening.  90 tablet  3   Current Facility-Administered Medications  Medication Dose Route Frequency Provider Last Rate Last Dose  . 0.9 %  sodium chloride infusion  500 mL Intravenous Continuous Gatha Mayer, MD        Allergies  Allergen Reactions  . Alendronate Sodium  REACTION: pt states INTOL to Fosamax w/ esophagitis  . Cefdinir     REACTION: diarrhea  . Esomeprazole Magnesium     REACTION: pt states INTOL to Nexium  . Levofloxacin     REACTION: diarrhea  . Omeprazole     REACTION: pt states INTOL to Prilosec  . Other     Pneumonia vaccine---felt tired and achy and sick x 6 months to get over this.  . Penicillins   . Shellfish-Derived Products Hives, Nausea Only and Rash    History   Social History  . Marital Status: Married    Spouse Name: Krystal [name]    Number of Children: N/A  . Years of Education: N/A   Occupational History  . retired    Social History Main Topics  . Smoking status: Never Smoker   . Smokeless tobacco: Never Used  . Alcohol Use: 0.0 oz/week     Comment: occasionally  . Drug Use: No  . Sexual Activity: Not on file   Other Topics Concern  . Not on file   Social History Narrative   . No narrative on file     Review of Systems: General: negative for chills, fever, night sweats or weight changes.  Cardiovascular: negative for chest pain, dyspnea on exertion, edema, orthopnea, palpitations, paroxysmal nocturnal dyspnea or shortness of breath Dermatological: negative for rash Respiratory: negative for cough or wheezing Urologic: negative for hematuria Abdominal: negative for nausea, vomiting, diarrhea, bright red blood per rectum, melena, or hematemesis Neurologic: negative for visual changes, syncope, or dizziness All other systems reviewed and are otherwise negative except as noted above.    Blood pressure 138/84, pulse 70, height 5\' 5"  (1.651 m), weight 163 lb 3.2 oz (74.027 kg).  General appearance: alert and no distress Neck: no adenopathy, no carotid bruit, no JVD, supple, symmetrical, trachea midline and thyroid not enlarged, symmetric, no tenderness/mass/nodules Lungs: clear to auscultation bilaterally Heart: regular rate and rhythm, S1, S2 normal, no murmur, click, rub or gallop Extremities: extremities normal, atraumatic, no cyanosis or edema and 2+ pedal pulses without edema  EKG normal sinus rhythm at 70 with bipolar branch block and occasional PVC  ASSESSMENT AND PLAN:   CORONARY ARTERY DISEASE Status post stenting of the circumflex coronary artery by Dr. Eustace Quail n 2003 with a Cypher drug-eluting stent. Her other arteries were normal as was her LV function. She denies chest pain or shortness of breath. Her last Myoview performed in 2008 was nonischemic.  HYPERLIPIDEMIA On statin therapy with her most recent lipid profile performed 11/16/12 revealing a total cholesterol of 142, LDL 76 and HDL of 46  HYPERTENSION Controlled on current medications      Lorretta Harp MD [facility], [facility] 07/18/2013 9:55 AM

## 2013-07-18 NOTE — Assessment & Plan Note (Signed)
On statin therapy with her most recent lipid profile performed 11/16/12 revealing a total cholesterol of 142, LDL 76 and HDL of 46

## 2013-07-18 NOTE — Assessment & Plan Note (Signed)
Controlled on current medications 

## 2013-07-18 NOTE — Assessment & Plan Note (Signed)
Status post stenting of the circumflex coronary artery by Dr. Eustace Quail n 2003 with a Cypher drug-eluting stent. Her other arteries were normal as was her LV function. She denies chest pain or shortness of breath. Her last Myoview performed in 2008 was nonischemic.

## 2013-07-22 ENCOUNTER — Telehealth: Payer: Self-pay | Admitting: Family Medicine

## 2013-07-22 NOTE — Telephone Encounter (Signed)
Please add the following re-fills to re-fill list:  simvastatin (ZOCOR) 40 MG tablet metoprolol succinate (TOPROL XL) 25 MG 24 hr tablet amLODipine (NORVASC) 10 MG tablet

## 2013-07-22 NOTE — Telephone Encounter (Signed)
[facility] (MAIL ORDER) [facility] - [place], [place] is requesting a re-fill on losartan (COZAAR) 50 MG tablet

## 2013-07-26 MED ORDER — AMLODIPINE BESYLATE 10 MG PO TABS: 10.0000 mg | ORAL_TABLET | Freq: Every day | ORAL | Status: DC

## 2013-07-26 MED ORDER — LOSARTAN POTASSIUM 50 MG PO TABS: 50.0000 mg | ORAL_TABLET | Freq: Every day | ORAL | Status: DC

## 2013-07-26 MED ORDER — METOPROLOL SUCCINATE ER 25 MG PO TB24: 25.0000 mg | ORAL_TABLET | Freq: Every day | ORAL | Status: DC

## 2013-07-26 MED ORDER — SIMVASTATIN 40 MG PO TABS: 40.0000 mg | ORAL_TABLET | Freq: Every evening | ORAL | Status: DC

## 2013-07-26 NOTE — Telephone Encounter (Signed)
I sent 4 scripts e-scribe.

## 2013-07-28 ENCOUNTER — Other Ambulatory Visit: Payer: Self-pay

## 2013-07-28 DIAGNOSIS — Z1231 Encounter for screening mammogram for malignant neoplasm of breast: Secondary | ICD-10-CM

## 2013-08-25 ENCOUNTER — Encounter: Payer: Self-pay | Admitting: Internal Medicine

## 2013-09-06 ENCOUNTER — Encounter (INDEPENDENT_AMBULATORY_CARE_PROVIDER_SITE_OTHER): Payer: Self-pay

## 2013-09-06 ENCOUNTER — Ambulatory Visit
Admission: RE | Admit: 2013-09-06 | Discharge: 2013-09-06 | Disposition: A | Discharge status: Home / Self Care | Payer: Medicare Other | Source: Ambulatory Visit

## 2013-09-06 DIAGNOSIS — Z1231 Encounter for screening mammogram for malignant neoplasm of breast: Secondary | ICD-10-CM

## 2013-09-14 ENCOUNTER — Encounter: Payer: Self-pay | Admitting: Family Medicine

## 2013-09-14 ENCOUNTER — Ambulatory Visit (INDEPENDENT_AMBULATORY_CARE_PROVIDER_SITE_OTHER): Payer: Medicare Other | Admitting: Family Medicine

## 2013-09-14 VITALS — BP 139/78 | HR 79 | Temp 99.3°F | Ht 65.0 in | Wt 162.0 lb

## 2013-09-14 DIAGNOSIS — J019 Acute sinusitis, unspecified: Secondary | ICD-10-CM

## 2013-09-14 MED ORDER — FLUTICASONE PROPIONATE 50 MCG/ACT NA SUSP: 2.0000 | Freq: Every day | NASAL | Status: DC | PRN

## 2013-09-14 MED ORDER — SULFAMETHOXAZOLE-TMP DS 800-160 MG PO TABS: 1.0000 | ORAL_TABLET | Freq: Two times a day (BID) | ORAL | Status: DC

## 2013-09-14 NOTE — Progress Notes (Signed)
Pre visit review using our clinic review tool, if applicable. No additional management support is needed unless otherwise documented below in the visit note. 

## 2013-09-14 NOTE — Progress Notes (Signed)
   Subjective:    Patient ID: Krystal [name], female    DOB: 12-26-1929, 80 y.o.   MRN: [id number]  HPI Here for one week of stuffy head, PND, and coughing up yellow sputum. No fever. On Claritin and Delsym.    Review of Systems  Constitutional: Negative.   HENT: Positive for congestion, postnasal drip and sinus pressure.   Eyes: Negative.   Respiratory: Positive for cough.        Objective:   Physical Exam  Constitutional: She appears well-developed and well-nourished.  HENT:  Right Ear: External ear normal.  Left Ear: External ear normal.  Nose: Nose normal.  Mouth/Throat: Oropharynx is clear and moist.  Eyes: Conjunctivae are normal.  Pulmonary/Chest: Effort normal and breath sounds normal.  Lymphadenopathy:    She has no cervical adenopathy.          Assessment & Plan:  Recheck prn

## 2013-10-18 ENCOUNTER — Other Ambulatory Visit: Payer: Self-pay | Admitting: Internal Medicine

## 2013-10-18 MED ORDER — DICYCLOMINE HCL 10 MG PO CAPS: 10.0000 mg | ORAL_CAPSULE | Freq: Four times a day (QID) | ORAL | Status: DC | PRN

## 2013-10-31 ENCOUNTER — Telehealth: Payer: Self-pay | Admitting: Family Medicine

## 2013-10-31 NOTE — Telephone Encounter (Signed)
Should pt have any labs ordered, if not can I refill these medications?

## 2013-10-31 NOTE — Telephone Encounter (Signed)
Please refill all these for one year  

## 2013-10-31 NOTE — Telephone Encounter (Signed)
Pt is new to dr fry and request 90 day refill amLODipine (NORVASC) 10 MG tablet metoprolol succinate (TOPROL XL) 25 MG 24 hr tablet simvastatin (ZOCOR) 40 MG tablet  losartan (COZAAR) 50 MG tablet [facility]                              My Last Outpatient Progress NoteYou have written no Outpatient Progress Notes for this patient

## 2013-10-31 NOTE — Telephone Encounter (Signed)
Please refill these for one year  

## 2013-11-01 MED ORDER — METOPROLOL SUCCINATE ER 25 MG PO TB24: 25.0000 mg | ORAL_TABLET | Freq: Every day | ORAL | Status: DC

## 2013-11-01 MED ORDER — AMLODIPINE BESYLATE 10 MG PO TABS: 10.0000 mg | ORAL_TABLET | Freq: Every day | ORAL | Status: DC

## 2013-11-01 MED ORDER — LOSARTAN POTASSIUM 50 MG PO TABS: 50.0000 mg | ORAL_TABLET | Freq: Every day | ORAL | Status: DC

## 2013-11-01 MED ORDER — SIMVASTATIN 40 MG PO TABS: 40.0000 mg | ORAL_TABLET | Freq: Every evening | ORAL | Status: DC

## 2013-11-01 NOTE — Addendum Note (Signed)
Addended by: Aggie Hacker A on: 11/01/2013 01:08 PM   Modules accepted: Orders

## 2013-11-01 NOTE — Telephone Encounter (Signed)
I sent all 4 scripts e-scribe to [facility].

## 2013-11-09 ENCOUNTER — Telehealth: Payer: Self-pay | Admitting: *Deleted

## 2013-11-09 DIAGNOSIS — E782 Mixed hyperlipidemia: Secondary | ICD-10-CM

## 2013-11-09 DIAGNOSIS — Z79899 Other long term (current) drug therapy: Secondary | ICD-10-CM

## 2013-11-09 NOTE — Telephone Encounter (Signed)
Lab slip mailed to patient for routine lipid and liver

## 2013-11-16 ENCOUNTER — Other Ambulatory Visit: Payer: Self-pay

## 2013-11-16 MED ORDER — LANSOPRAZOLE 15 MG PO CPDR: 15.0000 mg | DELAYED_RELEASE_CAPSULE | Freq: Two times a day (BID) | ORAL | Status: DC

## 2013-11-16 NOTE — Telephone Encounter (Signed)
Patient came in with husband today who was seeing Dr. Carlean Purl.  She requested a refill on her PPI.  She is doing well and Dr. Carlean Purl said ok to refill.

## 2013-11-21 LAB — LIPID PANEL
CHOL/HDL RATIO: 3.6 ratio
CHOLESTEROL: 164 mg/dL (ref 0–200)
HDL: 45 mg/dL (ref >39)
LDL Cholesterol: 89 mg/dL (ref 0–99)
TRIGLYCERIDES: 151 mg/dL — AB (ref <150)
VLDL: 30 mg/dL (ref 0–40)

## 2013-11-21 LAB — HEPATIC FUNCTION PANEL
ALBUMIN: 4.5 g/dL (ref 3.5–5.2)
ALT: 18 U/L (ref 0–35)
AST: 20 U/L (ref 0–37)
Alkaline Phosphatase: 117 U/L (ref 39–117)
Bilirubin, Direct: 0.1 mg/dL (ref 0.0–0.3)
Indirect Bilirubin: 0.4 mg/dL (ref 0.2–1.2)
TOTAL PROTEIN: 6.8 g/dL (ref 6.0–8.3)
Total Bilirubin: 0.5 mg/dL (ref 0.2–1.2)

## 2013-11-22 ENCOUNTER — Encounter: Payer: Self-pay | Admitting: *Deleted

## 2013-11-25 ENCOUNTER — Encounter: Payer: Self-pay | Admitting: Internal Medicine

## 2014-01-02 ENCOUNTER — Telehealth: Payer: Self-pay | Admitting: Internal Medicine

## 2014-01-02 NOTE — Telephone Encounter (Signed)
Patient will come in and see Tye Savoy RNP tomorrow at 2:00

## 2014-01-03 ENCOUNTER — Encounter: Payer: Self-pay | Admitting: Nurse Practitioner

## 2014-01-03 ENCOUNTER — Ambulatory Visit (INDEPENDENT_AMBULATORY_CARE_PROVIDER_SITE_OTHER): Payer: Medicare Other | Admitting: Nurse Practitioner

## 2014-01-03 VITALS — BP 148/72 | HR 76 | Ht 64.5 in | Wt 162.0 lb

## 2014-01-03 DIAGNOSIS — K589 Irritable bowel syndrome without diarrhea: Secondary | ICD-10-CM

## 2014-01-03 MED ORDER — DICYCLOMINE HCL 10 MG PO CAPS: 10.0000 mg | ORAL_CAPSULE | Freq: Two times a day (BID) | ORAL | Status: DC

## 2014-01-03 MED ORDER — METRONIDAZOLE 250 MG PO TABS: 250.0000 mg | ORAL_TABLET | Freq: Three times a day (TID) | ORAL | Status: DC

## 2014-01-03 NOTE — Patient Instructions (Addendum)
We have sent the following medications to your pharmacy for you to pick up at your convenience: Flagyl 250 mg, please take one tablet by mouth three times daily for ten days  Increase your Bentyl to twice daily, new prescription was sent to your pharmacy   Resume Align   Please call and ask for [place] if diarrhea is not better so we can obtain stool studies

## 2014-01-04 ENCOUNTER — Encounter: Payer: Self-pay | Admitting: Nurse Practitioner

## 2014-01-04 NOTE — Progress Notes (Signed)
     History of Present Illness:  Patient is an 80 year old female known to Dr. Carlean Purl. She has chronic intermittent loose stools / lower abdominal pain. She comes in today with a 1.5 week history of crampy diarrhea. These are same symptoms as all other episodes. Stool non-bloody. No associated fever. Bentyl helps but only taking it once a day. She took antibiotics two months back for dental work. Antibiotics gave her diarrhea but she got better after completion of antibiotics.    Current Medications, Allergies, Past Medical History, Past Surgical History, Family History and Social History were reviewed in [employer] record.  Physical Exam: General: Pleasant, well developed , white female in no acute distress Head: Normocephalic and atraumatic Eyes:  sclerae anicteric, conjunctiva pink  Ears: Normal auditory acuity Lungs: Clear throughout to auscultation Heart: Regular rate and rhythm Abdomen: Soft, non distended, non-tender. No masses, no hepatomegaly. Normal bowel sounds Musculoskeletal: Symmetrical with no gross deformities  Extremities: No edema  Neurological: Alert oriented x 4, grossly nonfocal Psychological:  Alert and cooperative. Normal mood and affect  Assessment and Recommendations:   80 year old female with acute on chronic intermittent loose stool and lower abdominal pain. Patient usually gets relief with antibiotics and wants a refill on Flagyl. We discussed "waiting this out" since stools have decreased in frequency and are not as loose now but she really wants to proceed with treatment. Patient will call if symptoms don't resolve. Advised her to increase Bentyl to BID as needed.  Patient recently stopped Align, I think she should restart it .

## 2014-01-05 NOTE — Progress Notes (Signed)
Agree with Ms. Guenther's assessment and plan. Tyresa Prindiville E. Donal Lynam, MD, FACG   

## 2014-01-20 ENCOUNTER — Ambulatory Visit (INDEPENDENT_AMBULATORY_CARE_PROVIDER_SITE_OTHER): Payer: Medicare Other | Admitting: *Deleted

## 2014-01-20 DIAGNOSIS — Z23 Encounter for immunization: Secondary | ICD-10-CM

## 2014-01-27 ENCOUNTER — Ambulatory Visit: Payer: Medicare Other | Admitting: Family Medicine

## 2014-04-06 ENCOUNTER — Encounter: Payer: Self-pay | Admitting: Internal Medicine

## 2014-04-06 ENCOUNTER — Ambulatory Visit (INDEPENDENT_AMBULATORY_CARE_PROVIDER_SITE_OTHER): Payer: PPO | Admitting: Internal Medicine

## 2014-04-06 VITALS — BP 148/68 | HR 64 | Ht 64.5 in | Wt 161.0 lb

## 2014-04-06 DIAGNOSIS — K219 Gastro-esophageal reflux disease without esophagitis: Secondary | ICD-10-CM

## 2014-04-06 NOTE — Progress Notes (Signed)
   Subjective:    Patient ID: Krystal [name], female    DOB: 04-12-1929, 81 y.o.   MRN: [id number]  HPI Patient is here for follow-up with questions about reflux disease today. She is having some increasing belching and some heartburn after meals. She drinks one or 2 cups of half decaf half regular coffee a day. Otherwise avoids reflux of genic foods for the most part. She has been on Prevacid 15 mg once a day with some evening doses at times on a when necessary basis which generally controls things. She is not complaining of dysphagia. She is questioning whether she should go to a higher dose of the Prevacid i.e. taking 30 mg in the morning. Medications, allergies, past medical history, past surgical history, family history and social history are reviewed and updated in the EMR.   Review of Systems As above    Objective:   Physical Exam She is well-developed well-nourished and looks younger than stated age    Assessment & Plan:  GERD (gastroesophageal reflux disease) Will try 30 mg lansoprazole in AM to see if that improves her sxs F/U prn

## 2014-04-06 NOTE — Assessment & Plan Note (Signed)
Will try 30 mg lansoprazole in AM to see if that improves her sxs F/U prn

## 2014-04-06 NOTE — Patient Instructions (Signed)
Per Dr Carlean Purl take 2 capsules of your prevacid every AM thirty minutes before breakfast.  Follow up with Dr Carlean Purl as needed.   I appreciate the opportunity to care for you. Silvano Rusk, M.D., [facility]

## 2014-05-09 ENCOUNTER — Ambulatory Visit (INDEPENDENT_AMBULATORY_CARE_PROVIDER_SITE_OTHER): Payer: PPO | Admitting: Family Medicine

## 2014-05-09 ENCOUNTER — Encounter: Payer: Self-pay | Admitting: Family Medicine

## 2014-05-09 VITALS — BP 147/78 | HR 75 | Temp 98.7°F | Ht 64.5 in | Wt 161.0 lb

## 2014-05-09 DIAGNOSIS — I2510 Atherosclerotic heart disease of native coronary artery without angina pectoris: Secondary | ICD-10-CM

## 2014-05-09 DIAGNOSIS — E785 Hyperlipidemia, unspecified: Secondary | ICD-10-CM

## 2014-05-09 DIAGNOSIS — M150 Primary generalized (osteo)arthritis: Secondary | ICD-10-CM

## 2014-05-09 DIAGNOSIS — I10 Essential (primary) hypertension: Secondary | ICD-10-CM

## 2014-05-09 DIAGNOSIS — M159 Polyosteoarthritis, unspecified: Secondary | ICD-10-CM

## 2014-05-09 MED ORDER — ROSUVASTATIN CALCIUM 10 MG PO TABS: 10.0000 mg | ORAL_TABLET | Freq: Every day | ORAL | Status: DC

## 2014-05-09 NOTE — Progress Notes (Signed)
   Subjective:    Patient ID: Krystal [name], female    DOB: Aug 20, 1929, 82 y.o.   MRN: [id number]  HPI Here to discuss her medications. She had been taking 20 mg of Zocor for several years and had done well, but last fall her cardiologist increased this to 40 mg daily. Now for the past 2 months she has had achy pains in her muscles and joints. She had taken Crestor several years ago and had done well with that. She is currently taking Meloxicam for arthritis pains.    Review of Systems  Constitutional: Negative.   Respiratory: Negative.   Cardiovascular: Negative.   Musculoskeletal: Positive for arthralgias. Negative for back pain, joint swelling and gait problem.       Objective:   Physical Exam  Constitutional: She appears well-developed and well-nourished.  Cardiovascular: Normal rate, regular rhythm, normal heart sounds and intact distal pulses.   Pulmonary/Chest: Effort normal and breath sounds normal.  Musculoskeletal: Normal range of motion. She exhibits no edema or tenderness.          Assessment & Plan:  Her muscle aches may well be related to the higher dose of Zocor. We will stop this and switch to Crestor 10 mg daily. Recheck fasting labs in 90 days. Her HTN is stable.

## 2014-05-09 NOTE — Progress Notes (Signed)
Pre visit review using our clinic review tool, if applicable. No additional management support is needed unless otherwise documented below in the visit note. 

## 2014-05-10 ENCOUNTER — Telehealth: Payer: Self-pay | Admitting: Family Medicine

## 2014-05-10 NOTE — Telephone Encounter (Signed)
[facility] following up on  Drug interaction between simvastatin (ZOCOR) 40 MG tablet and amLODipine (NORVASC) 10 MG tablet They cannot dispense until they here from you. They state a fax was sent. [phone] direct line to pharm

## 2014-05-10 NOTE — Telephone Encounter (Signed)
Please call the pharmacy and tell them to fill the prescriptions, that I am aware of the situation

## 2014-05-11 NOTE — Telephone Encounter (Signed)
I left a voice message with the below information.

## 2014-05-20 ENCOUNTER — Ambulatory Visit (INDEPENDENT_AMBULATORY_CARE_PROVIDER_SITE_OTHER): Payer: PPO | Admitting: Internal Medicine

## 2014-05-20 ENCOUNTER — Encounter: Payer: Self-pay | Admitting: Internal Medicine

## 2014-05-20 VITALS — BP 138/70 | HR 78 | Temp 97.8°F | Ht 64.5 in | Wt 163.2 lb

## 2014-05-20 DIAGNOSIS — J309 Allergic rhinitis, unspecified: Secondary | ICD-10-CM

## 2014-05-20 DIAGNOSIS — J329 Chronic sinusitis, unspecified: Secondary | ICD-10-CM

## 2014-05-20 DIAGNOSIS — J069 Acute upper respiratory infection, unspecified: Secondary | ICD-10-CM

## 2014-05-20 MED ORDER — DOXYCYCLINE HYCLATE 100 MG PO TABS: 100.0000 mg | ORAL_TABLET | Freq: Two times a day (BID) | ORAL | Status: DC

## 2014-05-20 MED ORDER — HYDROCODONE-HOMATROPINE 5-1.5 MG/5ML PO SYRP: ORAL_SOLUTION | ORAL | Status: DC

## 2014-05-20 MED ORDER — ALBUTEROL SULFATE HFA 108 (90 BASE) MCG/ACT IN AERS: 2.0000 | INHALATION_SPRAY | Freq: Four times a day (QID) | RESPIRATORY_TRACT | Status: DC | PRN

## 2014-05-20 NOTE — Progress Notes (Signed)
Pre visit review using our clinic review tool, if applicable. No additional management support is needed unless otherwise documented below in the visit note.   Chief Complaint  Patient presents with  . Sinusitis    C/O facial pressure, headache, PND, dry cough & chest tighness x 1 week    HPI: Patient comes in today for SDA Saturday clinic for  new problem evaluation. See above  A week .  "Living on zyrtec "and not helping drainage .  suddnetly got worse this week and feeling chest  Not right and drainage is "awful"   coughin g up.  No fever but nauseas.  No high fever chills ROS: See pertinent positives and negatives per HPI. No sob hemoptysis pcn allergy   Past Medical History  Diagnosis Date  . Hypertension   . CAD (coronary artery disease)   . Venous insufficiency   . Hyperlipidemia   . Hiatal hernia   . GERD (gastroesophageal reflux disease)   . Diverticulosis   . Adenomatous colon polyp   . Renal cysts, acquired, bilateral   . Osteoarthritis   . Chronic lower back pain   . Headache(784.0)   . Anxiety   . Allergy     seasonal  . Anemia     pt. denies  . Cataract     cataracts removed left eye.  . Osteoporosis   . IBS (irritable bowel syndrome) 10/13/2011    Family History  Problem Relation Age of Onset  . Heart disease Father   . Colon cancer Neg Hx   . Stroke Sister     History   Social History  . Marital Status: Married    Spouse Name: [name]  . Number of Children: N/A  . Years of Education: N/A   Occupational History  . retired    Social History Main Topics  . Smoking status: Never Smoker   . Smokeless tobacco: Never Used  . Alcohol Use: 0.0 oz/week    0 Standard drinks or equivalent per week     Comment: occasionally  . Drug Use: No  . Sexual Activity: Not on file   Other Topics Concern  . None   Social History Narrative    Outpatient Encounter Prescriptions as of 05/20/2014  Medication Sig  . amLODipine (NORVASC) 10 MG tablet Take 1  tablet (10 mg total) by mouth daily.  Marland Kitchen aspirin EC 81 MG tablet Take 81 mg by mouth daily.  . Calcium Carbonate-Vitamin D 600-400 MG-UNIT per tablet Take 1 tablet by mouth 2 (two) times daily.    . cetirizine (ZYRTEC) 10 MG tablet Take 10 mg by mouth daily.  . Cholecalciferol (VITAMIN D3) 2000 UNITS TABS Take 2,000 Units by mouth daily.    . fluocinonide cream (LIDEX) 4.69 % Apply 1 application topically daily.  . fluticasone (FLONASE) 50 MCG/ACT nasal spray Place 2 sprays into both nostrils daily as needed.  . lansoprazole (PREVACID) 15 MG capsule Take 30 mg by mouth daily. Take 30 minutes prior to breakfast  . LORazepam (ATIVAN) 1 MG tablet Take 0.5 tablets (0.5 mg total) by mouth at bedtime as needed for anxiety.  Marland Kitchen losartan (COZAAR) 50 MG tablet Take 1 tablet (50 mg total) by mouth daily.  . meloxicam (MOBIC) 7.5 MG tablet Take 1 tablet (7.5 mg total) by mouth daily as needed for pain.  . metoprolol succinate (TOPROL XL) 25 MG 24 hr tablet Take 1 tablet (25 mg total) by mouth daily.  . ondansetron (ZOFRAN) 4 MG tablet Take  1 tablet (4 mg total) by mouth every 4 (four) hours as needed for nausea or vomiting.  . Probiotic Product (ALIGN) 4 MG CAPS Take 1 capsule by mouth daily.  . promethazine (PHENERGAN) 25 MG tablet 1/2-1 tab po every 4 hours as needed  . rosuvastatin (CRESTOR) 10 MG tablet Take 1 tablet (10 mg total) by mouth daily.  . simethicone (MYLICON) 536 MG chewable tablet Chew 125 mg by mouth every 6 (six) hours as needed for flatulence.   Marland Kitchen albuterol (PROVENTIL HFA;VENTOLIN HFA) 108 (90 BASE) MCG/ACT inhaler Inhale 2 puffs into the lungs every 6 (six) hours as needed for wheezing or shortness of breath.  . doxycycline (VIBRA-TABS) 100 MG tablet Take 1 tablet (100 mg total) by mouth 2 (two) times daily. For sinus infection  . HYDROcodone-homatropine (HYCODAN) 5-1.5 MG/5ML syrup 1 tsp at night or every 4-6 hours if needed for cough  . [DISCONTINUED] fexofenadine-pseudoephedrine  (ALLEGRA-D 24) 180-240 MG per 24 hr tablet Take 1 tablet by mouth as needed.    EXAM:  BP 138/70 mmHg  Pulse 78  Temp(Src) 97.8 F (36.6 C) (Oral)  Ht 5' 4.5" (1.638 m)  Wt 163 lb 4 oz (74.05 kg)  BMI 27.60 kg/m2  SpO2 95%  Body mass index is 27.6 kg/(m^2). WDWN in NAD  quiet respirations; mildly congested not hoarse. Non toxic . HEENT: Normocephalic ;atraumatic , Eyes;  PERRL, EOMs  Full, lids and conjunctiva clear,,Ears: no deformities, canals nl, TM landmarks normal, Nose: no deformity or discharge but congested;face minimally tender Mouth : OP clear without lesion or edema . Neck: Supple without adenopathy or masses or bruits Chest:  bs = ocass res[ sound  But no active wheezes rales or rhonchi CV:  S1-S2 no gallops or murmurs peripheral perfusion is normal Skin :nl perfusion and no acute rashes  MS: moves all extremities without noticeable focal  abnormality PSYCH: pleasant and cooperative, no obvious depression or anxiety  ASSESSMENT AND PLAN:  Discussed the following assessment and plan:  Allergic sinusitis  Sinusitis, unspecified chronicity, unspecified location - prob viral  expectant managment  if alarm sx then can add antibiotic  s rx for now  Acute upper respiratory infection of multiple sites  -Patient advised to return or notify health care team  if symptoms worsen ,persist or new concerns arise.  Patient Instructions  This may be a viral sinus infection and allergy   Could  med and inhaler if needed.Then  Can add antibiotic if pain not improving  in the next  Few . Fu with pcp.  If  persistent or progressive     Standley Brooking. [place] M.D.

## 2014-05-20 NOTE — Patient Instructions (Signed)
This may be a viral sinus infection and allergy   Could  med and inhaler if needed.Then  Can add antibiotic if pain not improving  in the next  Few . Fu with pcp.  If  persistent or progressive

## 2014-06-09 ENCOUNTER — Encounter (HOSPITAL_COMMUNITY): Payer: Self-pay | Admitting: Emergency Medicine

## 2014-06-09 ENCOUNTER — Emergency Department (HOSPITAL_COMMUNITY)
Admission: EM | Admit: 2014-06-09 | Discharge: 2014-06-09 | Disposition: A | Discharge status: Home / Self Care | Payer: PPO | Attending: Emergency Medicine | Admitting: Emergency Medicine

## 2014-06-09 DIAGNOSIS — Z110 Encounter for screening for intestinal infectious diseases: Secondary | ICD-10-CM | POA: Insufficient documentation

## 2014-06-09 DIAGNOSIS — R0602 Shortness of breath: Secondary | ICD-10-CM | POA: Diagnosis not present

## 2014-06-09 DIAGNOSIS — R42 Dizziness and giddiness: Secondary | ICD-10-CM | POA: Diagnosis not present

## 2014-06-09 DIAGNOSIS — G8929 Other chronic pain: Secondary | ICD-10-CM | POA: Insufficient documentation

## 2014-06-09 DIAGNOSIS — Z90710 Acquired absence of both cervix and uterus: Secondary | ICD-10-CM | POA: Insufficient documentation

## 2014-06-09 DIAGNOSIS — I10 Essential (primary) hypertension: Secondary | ICD-10-CM | POA: Insufficient documentation

## 2014-06-09 DIAGNOSIS — R197 Diarrhea, unspecified: Secondary | ICD-10-CM | POA: Insufficient documentation

## 2014-06-09 DIAGNOSIS — Z8669 Personal history of other diseases of the nervous system and sense organs: Secondary | ICD-10-CM | POA: Insufficient documentation

## 2014-06-09 DIAGNOSIS — I2510 Atherosclerotic heart disease of native coronary artery without angina pectoris: Secondary | ICD-10-CM | POA: Diagnosis not present

## 2014-06-09 DIAGNOSIS — Z880 Allergy status to penicillin: Secondary | ICD-10-CM | POA: Diagnosis not present

## 2014-06-09 DIAGNOSIS — Z7982 Long term (current) use of aspirin: Secondary | ICD-10-CM | POA: Insufficient documentation

## 2014-06-09 DIAGNOSIS — Z87448 Personal history of other diseases of urinary system: Secondary | ICD-10-CM | POA: Insufficient documentation

## 2014-06-09 DIAGNOSIS — K219 Gastro-esophageal reflux disease without esophagitis: Secondary | ICD-10-CM | POA: Diagnosis not present

## 2014-06-09 DIAGNOSIS — Z7952 Long term (current) use of systemic steroids: Secondary | ICD-10-CM | POA: Diagnosis not present

## 2014-06-09 DIAGNOSIS — Z862 Personal history of diseases of the blood and blood-forming organs and certain disorders involving the immune mechanism: Secondary | ICD-10-CM | POA: Insufficient documentation

## 2014-06-09 DIAGNOSIS — E785 Hyperlipidemia, unspecified: Secondary | ICD-10-CM | POA: Insufficient documentation

## 2014-06-09 DIAGNOSIS — Z86010 Personal history of colonic polyps: Secondary | ICD-10-CM | POA: Insufficient documentation

## 2014-06-09 DIAGNOSIS — M1990 Unspecified osteoarthritis, unspecified site: Secondary | ICD-10-CM | POA: Insufficient documentation

## 2014-06-09 DIAGNOSIS — Z79899 Other long term (current) drug therapy: Secondary | ICD-10-CM | POA: Insufficient documentation

## 2014-06-09 LAB — TROPONIN I: Troponin I: <0.03 ng/mL (ref <0.031)

## 2014-06-09 LAB — CBC
HCT: 44.3 % (ref 36.0–46.0)
Hemoglobin: 14.6 g/dL (ref 12.0–15.0)
MCH: 31.3 pg (ref 26.0–34.0)
MCHC: 33 g/dL (ref 30.0–36.0)
MCV: 95.1 fL (ref 78.0–100.0)
Platelets: 263 10*3/uL (ref 150–400)
RBC: 4.66 MIL/uL (ref 3.87–5.11)
RDW: 14 % (ref 11.5–15.5)
WBC: 10.1 10*3/uL (ref 4.0–10.5)

## 2014-06-09 LAB — URINALYSIS, ROUTINE W REFLEX MICROSCOPIC
BILIRUBIN URINE: NEGATIVE
Glucose, UA: NEGATIVE mg/dL
Hgb urine dipstick: NEGATIVE
Ketones, ur: NEGATIVE mg/dL
Leukocytes, UA: NEGATIVE
Nitrite: NEGATIVE
PH: 7 (ref 5.0–8.0)
Protein, ur: NEGATIVE mg/dL
SPECIFIC GRAVITY, URINE: 1.006 (ref 1.005–1.030)
Urobilinogen, UA: 0.2 mg/dL (ref 0.0–1.0)

## 2014-06-09 LAB — BASIC METABOLIC PANEL
Anion gap: 10 (ref 5–15)
BUN: 23 mg/dL (ref 6–23)
CALCIUM: 9.3 mg/dL (ref 8.4–10.5)
CHLORIDE: 104 mmol/L (ref 96–112)
CO2: 23 mmol/L (ref 19–32)
CREATININE: 0.84 mg/dL (ref 0.50–1.10)
GFR calc non Af Amer: 63 mL/min — ABNORMAL LOW (ref >90)
GFR, EST AFRICAN AMERICAN: 73 mL/min — AB (ref >90)
Glucose, Bld: 119 mg/dL — ABNORMAL HIGH (ref 70–99)
Potassium: 3.6 mmol/L (ref 3.5–5.1)
Sodium: 137 mmol/L (ref 135–145)

## 2014-06-09 LAB — CBG MONITORING, ED: Glucose-Capillary: 103 mg/dL — ABNORMAL HIGH (ref 70–99)

## 2014-06-09 LAB — I-STAT TROPONIN, ED: TROPONIN I, POC: 0 ng/mL (ref 0.00–0.08)

## 2014-06-09 LAB — BRAIN NATRIURETIC PEPTIDE: B NATRIURETIC PEPTIDE 5: 98.2 pg/mL (ref 0.0–100.0)

## 2014-06-09 MED ORDER — SODIUM CHLORIDE 0.9 % IV BOLUS (SEPSIS)
1000.0000 mL | Freq: Once | INTRAVENOUS | Status: AC
  Administered 2014-06-09: 1000 mL via INTRAVENOUS

## 2014-06-09 NOTE — Discharge Instructions (Signed)
All the results in the ER are normal, labs and imaging. We are not sure what is causing your symptoms. The workup in the ER is not complete, and is limited to screening for life threatening and emergent conditions only, Please see your primary care doctor if diarrhea persists or gets worse. Because of recent antibiotics, you can have infectious cause of diarrhea. Come to the Er if there is severe pain, fevers, worsening diarrhea, dizziness.   Diarrhea Diarrhea is frequent loose and watery bowel movements. It can cause you to feel weak and dehydrated. Dehydration can cause you to become tired and thirsty, have a dry mouth, and have decreased urination that often is dark yellow. Diarrhea is a sign of another problem, most often an infection that will not last long. In most cases, diarrhea typically lasts 2-3 days. However, it can last longer if it is a sign of something more serious. It is important to treat your diarrhea as directed by your caregiver to lessen or prevent future episodes of diarrhea. CAUSES  Some common causes include:  Gastrointestinal infections caused by viruses, bacteria, or parasites.  Food poisoning or food allergies.  Certain medicines, such as antibiotics, chemotherapy, and laxatives.  Artificial sweeteners and fructose.  Digestive disorders. HOME CARE INSTRUCTIONS  Ensure adequate fluid intake (hydration): Have 1 cup (8 oz) of fluid for each diarrhea episode. Avoid fluids that contain simple sugars or sports drinks, fruit juices, whole milk products, and sodas. Your urine should be clear or pale yellow if you are drinking enough fluids. Hydrate with an oral rehydration solution that you can purchase at pharmacies, retail stores, and online. You can prepare an oral rehydration solution at home by mixing the following ingredients together:   - tsp table salt.   tsp baking soda.   tsp salt substitute containing potassium chloride.  1  tablespoons sugar.  1 L (34  oz) of water.  Certain foods and beverages may increase the speed at which food moves through the gastrointestinal (GI) tract. These foods and beverages should be avoided and include:  Caffeinated and alcoholic beverages.  High-fiber foods, such as raw fruits and vegetables, nuts, seeds, and whole grain breads and cereals.  Foods and beverages sweetened with sugar alcohols, such as xylitol, sorbitol, and mannitol.  Some foods may be well tolerated and may help thicken stool including:  Starchy foods, such as rice, toast, pasta, low-sugar cereal, oatmeal, grits, baked potatoes, crackers, and bagels.  Bananas.  Applesauce.  Add probiotic-rich foods to help increase healthy bacteria in the GI tract, such as yogurt and fermented milk products.  Wash your hands well after each diarrhea episode.  Only take over-the-counter or prescription medicines as directed by your caregiver.  Take a warm bath to relieve any burning or pain from frequent diarrhea episodes. SEEK IMMEDIATE MEDICAL CARE IF:   You are unable to keep fluids down.  You have persistent vomiting.  You have blood in your stool, or your stools are black and tarry.  You do not urinate in 6-8 hours, or there is only a small amount of very dark urine.  You have abdominal pain that increases or localizes.  You have weakness, dizziness, confusion, or light-headedness.  You have a severe headache.  Your diarrhea gets worse or does not get better.  You have a fever or persistent symptoms for more than 2-3 days.  You have a fever and your symptoms suddenly get worse. MAKE SURE YOU:   Understand these instructions.  Will watch  your condition.  Will get help right away if you are not doing well or get worse. Document Released: 03/07/2002 Document Revised: 08/01/2013 Document Reviewed: 11/23/2011 [facility] Patient Information 2015 [place], [place]. This information is not intended to replace advice given to you by your  health care provider. Make sure you discuss any questions you have with your health care provider.  Dizziness Dizziness is a common problem. It is a feeling of unsteadiness or light-headedness. You may feel like you are about to faint. Dizziness can lead to injury if you stumble or fall. A person of any age group can suffer from dizziness, but dizziness is more common in older adults. CAUSES  Dizziness can be caused by many different things, including:  Middle ear problems.  Standing for too long.  Infections.  An allergic reaction.  Aging.  An emotional response to something, such as the sight of blood.  Side effects of medicines.  Tiredness.  Problems with circulation or blood pressure.  Excessive use of alcohol or medicines, or illegal drug use.  Breathing too fast (hyperventilation).  An irregular heart rhythm (arrhythmia).  A low red blood cell count (anemia).  Pregnancy.  Vomiting, diarrhea, fever, or other illnesses that cause body fluid loss (dehydration).  Diseases or conditions such as Parkinson's disease, high blood pressure (hypertension), diabetes, and thyroid problems.  Exposure to extreme heat. DIAGNOSIS  Your health care provider will ask about your symptoms, perform a physical exam, and perform an electrocardiogram (ECG) to record the electrical activity of your heart. Your health care provider may also perform other heart or blood tests to determine the cause of your dizziness. These may include:  Transthoracic echocardiogram (TTE). During echocardiography, sound waves are used to evaluate how blood flows through your heart.  Transesophageal echocardiogram (TEE).  Cardiac monitoring. This allows your health care provider to monitor your heart rate and rhythm in real time.  Holter monitor. This is a portable device that records your heartbeat and can help diagnose heart arrhythmias. It allows your health care provider to track your heart activity for  several days if needed.  Stress tests by exercise or by giving medicine that makes the heart beat faster. TREATMENT  Treatment of dizziness depends on the cause of your symptoms and can vary greatly. HOME CARE INSTRUCTIONS   Drink enough fluids to keep your urine clear or pale yellow. This is especially important in very hot weather. In older adults, it is also important in cold weather.  Take your medicine exactly as directed if your dizziness is caused by medicines. When taking blood pressure medicines, it is especially important to get up slowly.  Rise slowly from chairs and steady yourself until you feel okay.  In the morning, first sit up on the side of the bed. When you feel okay, stand slowly while holding onto something until you know your balance is fine.  Move your legs often if you need to stand in one place for a long time. Tighten and relax your muscles in your legs while standing.  Have someone stay with you for 1-2 days if dizziness continues to be a problem. Do this until you feel you are well enough to stay alone. Have the person call your health care provider if he or she notices changes in you that are concerning.  Do not drive or use heavy machinery if you feel dizzy.  Do not drink alcohol. SEEK IMMEDIATE MEDICAL CARE IF:   Your dizziness or light-headedness gets worse.  You  feel nauseous or vomit.  You have problems talking, walking, or using your arms, hands, or legs.  You feel weak.  You are not thinking clearly or you have trouble forming sentences. It may take a friend or family member to notice this.  You have chest pain, abdominal pain, shortness of breath, or sweating.  Your vision changes.  You notice any bleeding.  You have side effects from medicine that seems to be getting worse rather than better. MAKE SURE YOU:   Understand these instructions.  Will watch your condition.  Will get help right away if you are not doing well or get  worse. Document Released: 09/10/2000 Document Revised: 03/22/2013 Document Reviewed: 10/04/2010 [facility] Patient Information 2015 [place], [place]. This information is not intended to replace advice given to you by your health care provider. Make sure you discuss any questions you have with your health care provider.

## 2014-06-09 NOTE — ED Notes (Signed)
MD at bedside. 

## 2014-06-09 NOTE — ED Notes (Signed)
Pt c/o SOB, n/d x 1 hour, states she took a nausea pill about 1 hour ago. Pt states she thought it was her heart.

## 2014-06-09 NOTE — ED Notes (Signed)
Bed: WA17 Expected date:  Expected time:  Means of arrival:  Comments: resus A when monitor returns

## 2014-06-14 ENCOUNTER — Telehealth: Payer: Self-pay | Admitting: Family Medicine

## 2014-06-14 NOTE — Telephone Encounter (Signed)
Pt said she went to the ER at [facility] and had a bad stomach virus. They told her to fup with her pcp but she don't think she need to said she is feeling better. Pt said Dr Sarajane Jews put her on Crestor and wanted to let him know that she is no longer taking it because it was to expensive and she could not tell the difference and is now taking Zocor

## 2014-06-15 NOTE — Telephone Encounter (Signed)
Noted  

## 2014-06-15 NOTE — ED Provider Notes (Signed)
CSN: [id number]     Arrival date & time 06/09/14  1241 History   First MD Initiated Contact with Patient 06/09/14 1335     Chief Complaint  Patient presents with  . Nausea  . Diarrhea  . Shortness of Breath     (Consider location/radiation/quality/duration/timing/severity/associated sxs/prior Treatment) HPI Comments: Pt comes in with cc of dib. Pt has hx of CAD, HL, HTN. Pt reports that she started getting dib last night. She also has complains of nausea and diarrhea. No chest pain associated with this. The DIB is better now, and there is no wheezing, cough. PT has no orthopnea, pnd like sx. Pt has no abd pain with nausea and diarrhea. No bloody stools, no fevers. PT also c/o dizzness, while she was up standing. No vertigo. No numbness, tingling.  Patient is a 81 y.o. female presenting with diarrhea and shortness of breath. The history is provided by the patient.  Diarrhea Associated symptoms: no abdominal pain, no headaches and no vomiting   Shortness of Breath Associated symptoms: no abdominal pain, no chest pain, no headaches, no neck pain and no vomiting     Past Medical History  Diagnosis Date  . Hypertension   . CAD (coronary artery disease)   . Venous insufficiency   . Hyperlipidemia   . Hiatal hernia   . GERD (gastroesophageal reflux disease)   . Diverticulosis   . Adenomatous colon polyp   . Renal cysts, acquired, bilateral   . Osteoarthritis   . Chronic lower back pain   . Headache(784.0)   . Anxiety   . Allergy     seasonal  . Anemia     pt. denies  . Cataract     cataracts removed left eye.  . Osteoporosis   . IBS (irritable bowel syndrome) 10/13/2011   Past Surgical History  Procedure Laterality Date  . Abdominal hysterectomy    . Varicose vein surgery    . Bladder repair    . Colonoscopy  08/17/2008    adenomatous polyp, diverticulosis, external hemorrhoids  . Cataract extraction      bilateral  . Upper gastrointestinal endoscopy  07/03/2010    hiatal  hernia  . Colonoscopy w/ biopsies      multiple   . Left lower extremity venous doppler  06/18/2011    No evidence of left lower extremity DVT  . Cardiac catheterization  01/12/2002    90% stenosis of the left circumflex, stented with a 3x15mm Cypher stent, resulting in reduction of 90% to 10%   . Cardiovascular stress test  03/22/2007    Mild inferolateral thinning toward the apex without significant ischemia. Nondiagnostic electrocardiogram.  . Transthoracic echocardiogram  11/18/2012    EF 42-87%, LV systolic mild-moderately reduced, mild-moderate mitral valve regurg, mild-moderate tricuspid valve regurg. NSR-LBBB with occas PVCs   Family History  Problem Relation Age of Onset  . Heart disease Father   . Colon cancer Neg Hx   . Stroke Sister    History  Substance Use Topics  . Smoking status: Never Smoker   . Smokeless tobacco: Never Used  . Alcohol Use: 0.0 oz/week    0 Standard drinks or equivalent per week     Comment: occasionally   OB History    No data available     Review of Systems  Constitutional: Positive for activity change.  Respiratory: Positive for shortness of breath.   Cardiovascular: Negative for chest pain.  Gastrointestinal: Positive for nausea and diarrhea. Negative for vomiting and abdominal  pain.  Genitourinary: Negative for dysuria.  Musculoskeletal: Negative for neck pain.  Neurological: Negative for headaches.      Allergies  Alendronate sodium; Cefdinir; Esomeprazole magnesium; Levofloxacin; Omeprazole; Other; Penicillins; Simvastatin; and Shellfish-derived products  Home Medications   Prior to Admission medications   Medication Sig Start Date End Date Taking? Authorizing Provider  albuterol (PROVENTIL HFA;VENTOLIN HFA) 108 (90 BASE) MCG/ACT inhaler Inhale 2 puffs into the lungs every 6 (six) hours as needed for wheezing or shortness of breath. 05/20/14   Burnis Medin, MD  amLODipine (NORVASC) 10 MG tablet Take 1 tablet (10 mg total) by  mouth daily. 11/01/13  Yes Laurey Morale, MD  aspirin EC 81 MG tablet Take 81 mg by mouth daily.   Yes Historical Provider, MD  Calcium Carbonate-Vitamin D 600-400 MG-UNIT per tablet Take 1 tablet by mouth 2 (two) times daily.     Yes Historical Provider, MD  Cholecalciferol (VITAMIN D3) 2000 UNITS TABS Take 2,000 Units by mouth daily.     Yes Historical Provider, MD  doxycycline (VIBRA-TABS) 100 MG tablet Take 1 tablet (100 mg total) by mouth 2 (two) times daily. For sinus infection 05/20/14   Burnis Medin, MD  fluocinonide cream (LIDEX) 1.32 % Apply 1 application topically daily. 07/07/13  Yes Historical Provider, MD  fluticasone (FLONASE) 50 MCG/ACT nasal spray Place 2 sprays into both nostrils daily as needed. 09/14/13  Yes Laurey Morale, MD  HYDROcodone-homatropine [facility]) 5-1.5 MG/5ML syrup 1 tsp at night or every 4-6 hours if needed for cough 05/20/14   Burnis Medin, MD  lansoprazole (PREVACID) 15 MG capsule Take 30 mg by mouth daily. Take 30 minutes prior to breakfast   Yes Historical Provider, MD  LORazepam (ATIVAN) 1 MG tablet Take 0.5 tablets (0.5 mg total) by mouth at bedtime as needed for anxiety. 04/28/13  Yes Noralee Space, MD  losartan (COZAAR) 50 MG tablet Take 1 tablet (50 mg total) by mouth daily. 11/01/13  Yes Laurey Morale, MD  meloxicam (MOBIC) 7.5 MG tablet Take 1 tablet (7.5 mg total) by mouth daily as needed for pain. 06/08/13   Laurey Morale, MD  metoprolol succinate (TOPROL XL) 25 MG 24 hr tablet Take 1 tablet (25 mg total) by mouth daily. 11/01/13  Yes Laurey Morale, MD  ondansetron (ZOFRAN) 4 MG tablet Take 1 tablet (4 mg total) by mouth every 4 (four) hours as needed for nausea or vomiting. 03/29/13  Yes Noralee Space, MD  Probiotic Product (ALIGN) 4 MG CAPS Take 1 capsule by mouth daily.   Yes Historical Provider, MD  promethazine (PHENERGAN) 25 MG tablet 1/2-1 tab po every 4 hours as needed 03/29/13   Noralee Space, MD  rosuvastatin (CRESTOR) 10 MG tablet Take 1 tablet (10 mg  total) by mouth daily. 05/09/14  Yes Laurey Morale, MD   BP 163/74 mmHg  Pulse 72  Temp(Src) 97.4 F (36.3 C) (Oral)  Resp 14  SpO2 98% Physical Exam  Constitutional: She is oriented to person, place, and time. She appears well-developed and well-nourished.  HENT:  Head: Normocephalic and atraumatic.  Eyes: EOM are normal. Pupils are equal, round, and reactive to light.  Neck: Neck supple.  Cardiovascular: Normal rate and regular rhythm.   Pulmonary/Chest: Effort normal. No respiratory distress. She has no wheezes. She has no rales.  Abdominal: Soft. She exhibits no distension. There is no tenderness. There is no rebound and no guarding.  Neurological: She is alert and  oriented to person, place, and time.  Skin: Skin is warm and dry.  Nursing note and vitals reviewed.   ED Course  Procedures (including critical care time) Labs Review Labs Reviewed  BASIC METABOLIC PANEL - Abnormal; Notable for the following:    Glucose, Bld 119 (*)    GFR calc non Af Amer 63 (*)    GFR calc Af Amer 73 (*)    All other components within normal limits  CBG MONITORING, ED - Abnormal; Notable for the following:    Glucose-Capillary 103 (*)    All other components within normal limits  CLOSTRIDIUM DIFFICILE BY PCR  CBC  BRAIN NATRIURETIC PEPTIDE  URINALYSIS, ROUTINE W REFLEX MICROSCOPIC  TROPONIN I  I-STAT TROPOININ, ED    Imaging Review No results found.   EKG Interpretation   Date/Time:  Friday June 09 2014 12:51:18 EST Ventricular Rate:  92 PR Interval:    QRS Duration: 163 QT Interval:  455 QTC Calculation: 563 R Axis:   60 Text Interpretation:  Normal sinus rhythm Paired ventricular premature  complexes Left  fasciluar block No significant change since last tracing  Confirmed by Kathrynn Humble, MD, Thelma Comp [id number]) on 06/09/2014 2:14:42 PM      MDM   Final diagnoses:  Dizziness  Diarrhea    PT with dizziness, nausea, shortness of breath, diarrhea. Vitals are stable and  WNL. PT has no abd pain/tenderness. She has no risk for cdiff. CBC is normal. Neuro exam is normal - with all the symptoms present, stroke appears unlikely. With the dib - there is no rales, no wheezing. Trops and Bnp are normal. Anticipate d/c with close f/u.   Varney Biles, MD 06/15/14 1315

## 2014-06-26 ENCOUNTER — Telehealth: Payer: Self-pay | Admitting: Family Medicine

## 2014-06-26 NOTE — Telephone Encounter (Signed)
Pt request refill LORazepam (ATIVAN) 1 MG tablet Pt takes PRN [facility] [employer]

## 2014-06-26 NOTE — Telephone Encounter (Signed)
PCP NA Can disp 14# refill  Further  Refills from dr Sarajane Jews

## 2014-06-27 MED ORDER — LORAZEPAM 1 MG PO TABS: 0.5000 mg | ORAL_TABLET | Freq: Every evening | ORAL | Status: DC | PRN

## 2014-06-27 NOTE — Telephone Encounter (Signed)
I called in script 

## 2014-06-27 NOTE — Addendum Note (Signed)
Addended by: Aggie Hacker A on: 06/27/2014 10:50 AM   Modules accepted: Orders

## 2014-09-08 ENCOUNTER — Encounter: Payer: Self-pay | Admitting: Family Medicine

## 2014-09-08 ENCOUNTER — Ambulatory Visit (INDEPENDENT_AMBULATORY_CARE_PROVIDER_SITE_OTHER): Payer: PPO | Admitting: Family Medicine

## 2014-09-08 VITALS — BP 148/88 | HR 72 | Temp 98.2°F | Resp 20 | Ht 64.5 in | Wt 163.5 lb

## 2014-09-08 DIAGNOSIS — J069 Acute upper respiratory infection, unspecified: Secondary | ICD-10-CM

## 2014-09-08 MED ORDER — METHYLPREDNISOLONE ACETATE 80 MG/ML IJ SUSP
120.0000 mg | Freq: Once | INTRAMUSCULAR | Status: AC
  Administered 2014-09-08: 120 mg via INTRAMUSCULAR

## 2014-09-08 MED ORDER — HYDROCODONE-HOMATROPINE 5-1.5 MG/5ML PO SYRP: 5.0000 mL | ORAL_SOLUTION | ORAL | Status: DC | PRN

## 2014-09-08 NOTE — Progress Notes (Signed)
   Subjective:    Patient ID: Krystal [name], female    DOB: 1929-05-17, 81 y.o.   MRN: [id number]  HPI Here for 2 days of PND and a dry cough. No fever or ST. She is drinking fluids and she took some Mucinex DM with little benefit. Her husband has had similar sx for the past week.    Review of Systems  Constitutional: Negative.   HENT: Positive for congestion and postnasal drip. Negative for sinus pressure.   Eyes: Negative.   Respiratory: Positive for cough. Negative for chest tightness, shortness of breath and wheezing.        Objective:   Physical Exam  Constitutional: She appears well-developed and well-nourished.  HENT:  Right Ear: External ear normal.  Left Ear: External ear normal.  Nose: Nose normal.  Mouth/Throat: Oropharynx is clear and moist.  Eyes: Conjunctivae are normal.  Neck: No thyromegaly present.  Pulmonary/Chest: Effort normal and breath sounds normal. No respiratory distress. She has no wheezes. She has no rales.  Lymphadenopathy:    She has no cervical adenopathy.          Assessment & Plan:  Viral URI. Given a steroid shot and cough meds.

## 2014-09-08 NOTE — Progress Notes (Signed)
Pre visit review using our clinic review tool, if applicable. No additional management support is needed unless otherwise documented below in the visit note. 

## 2014-09-11 ENCOUNTER — Telehealth: Payer: Self-pay | Admitting: Family Medicine

## 2014-09-11 MED ORDER — AZITHROMYCIN 250 MG PO TABS: ORAL_TABLET | ORAL | Status: DC

## 2014-09-11 MED ORDER — METHYLPREDNISOLONE 4 MG PO TBPK: ORAL_TABLET | ORAL | Status: DC

## 2014-09-11 NOTE — Telephone Encounter (Signed)
Pt seen Friday and is not any better. Pt got better Sat and then it came right back and started all over again. Pt has productive cough w/ lots of mucus. Pt would like to  Know if dr fry will send in a RX. [facility]

## 2014-09-11 NOTE — Telephone Encounter (Signed)
I spoke with pt and send new script e-scribe.

## 2014-09-11 NOTE — Telephone Encounter (Signed)
Cancel the Zpack and call in a Medrol dose pack

## 2014-09-11 NOTE — Telephone Encounter (Signed)
Call in a Zpack  ?

## 2014-09-11 NOTE — Telephone Encounter (Signed)
Pt prefers prednisone.

## 2014-09-12 ENCOUNTER — Ambulatory Visit (INDEPENDENT_AMBULATORY_CARE_PROVIDER_SITE_OTHER): Payer: PPO | Admitting: Family Medicine

## 2014-09-12 ENCOUNTER — Ambulatory Visit (INDEPENDENT_AMBULATORY_CARE_PROVIDER_SITE_OTHER): Payer: PPO

## 2014-09-12 VITALS — BP 148/68 | HR 93 | Temp 98.0°F | Resp 17 | Ht 66.0 in | Wt 162.8 lb

## 2014-09-12 DIAGNOSIS — J069 Acute upper respiratory infection, unspecified: Secondary | ICD-10-CM

## 2014-09-12 DIAGNOSIS — R062 Wheezing: Secondary | ICD-10-CM

## 2014-09-12 DIAGNOSIS — J22 Unspecified acute lower respiratory infection: Secondary | ICD-10-CM

## 2014-09-12 DIAGNOSIS — R05 Cough: Secondary | ICD-10-CM | POA: Diagnosis not present

## 2014-09-12 DIAGNOSIS — R059 Cough, unspecified: Secondary | ICD-10-CM

## 2014-09-12 DIAGNOSIS — J0120 Acute ethmoidal sinusitis, unspecified: Secondary | ICD-10-CM

## 2014-09-12 DIAGNOSIS — J988 Other specified respiratory disorders: Secondary | ICD-10-CM | POA: Diagnosis not present

## 2014-09-12 MED ORDER — BENZONATATE 100 MG PO CAPS: 200.0000 mg | ORAL_CAPSULE | Freq: Two times a day (BID) | ORAL | Status: DC | PRN

## 2014-09-12 MED ORDER — DOXYCYCLINE HYCLATE 100 MG PO TABS
100.0000 mg | ORAL_TABLET | Freq: Two times a day (BID) | ORAL | Status: DC
Start: 2014-09-12 — End: 2015-05-23

## 2014-09-12 MED ORDER — METHYLPREDNISOLONE 4 MG PO TBPK
ORAL_TABLET | ORAL | Status: DC
Start: 2014-09-12 — End: 2015-02-20

## 2014-09-12 NOTE — Progress Notes (Signed)
Chief Complaint:  Chief Complaint  Patient presents with  . Cough    started 3 days ago. coughs up mucus   . Chest Pain    due to cough    HPI: Krystal [name] is a 81 y.o. female who is here for  7 day history of coughing and URI sxs, she currently has thick green mucus, and also has wheezing, saw PCP Dr Sarajane Jews on 6/10 and was given a steroid injection. She wanted another steroid pack and also abx to be called in but she did not get a response and was not able to see him. But based on epic he did send in by e fax a medrol dose pack and azithromycin but she states she did not know this.   She has some chest pain with coughing only, she has sinus pressure. She was given a steroid injection and that helped but now all of her sxs are back. She has some night time wheezing. + fevers and chills, all subjective. She denise ear pain, SOB, has had some nause with excessive cough. NO pedal edema. No vomiting no abd pain.  Has tried the hydromet but that makes her sick, she also does not like azithromycin because "it does not work " for her.   Past Medical History  Diagnosis Date  . Hypertension   . CAD (coronary artery disease)   . Venous insufficiency   . Hyperlipidemia   . Hiatal hernia   . GERD (gastroesophageal reflux disease)   . Diverticulosis   . Adenomatous colon polyp   . Renal cysts, acquired, bilateral   . Osteoarthritis   . Chronic lower back pain   . Headache(784.0)   . Anxiety   . Allergy     seasonal  . Anemia     pt. denies  . Cataract     cataracts removed left eye.  . Osteoporosis   . IBS (irritable bowel syndrome) 10/13/2011   Past Surgical History  Procedure Laterality Date  . Abdominal hysterectomy    . Varicose vein surgery    . Bladder repair    . Colonoscopy  08/17/2008    adenomatous polyp, diverticulosis, external hemorrhoids  . Cataract extraction      bilateral  . Upper gastrointestinal endoscopy  07/03/2010    hiatal hernia  . Colonoscopy w/  biopsies      multiple   . Left lower extremity venous doppler  06/18/2011    No evidence of left lower extremity DVT  . Cardiac catheterization  01/12/2002    90% stenosis of the left circumflex, stented with a 3x92mm Cypher stent, resulting in reduction of 90% to 10%   . Cardiovascular stress test  03/22/2007    Mild inferolateral thinning toward the apex without significant ischemia. Nondiagnostic electrocardiogram.  . Transthoracic echocardiogram  11/18/2012    EF 72-53%, LV systolic mild-moderately reduced, mild-moderate mitral valve regurg, mild-moderate tricuspid valve regurg. NSR-LBBB with occas PVCs   History   Social History  . Marital Status: Married    Spouse Name: [name]  . Number of Children: N/A  . Years of Education: N/A   Occupational History  . retired    Social History Main Topics  . Smoking status: Never Smoker   . Smokeless tobacco: Never Used  . Alcohol Use: 0.0 oz/week    0 Standard drinks or equivalent per week     Comment: occasionally  . Drug Use: No  . Sexual Activity: Not on file  Other Topics Concern  . None   Social History Narrative   Family History  Problem Relation Age of Onset  . Heart disease Father   . Colon cancer Neg Hx   . Stroke Sister    Allergies  Allergen Reactions  . Alendronate Sodium     REACTION: pt states INTOL to Fosamax w/ esophagitis  . Cefdinir     REACTION: diarrhea  . Esomeprazole Magnesium     REACTION: pt states INTOL to Nexium  . Levofloxacin     REACTION: diarrhea  . Omeprazole     REACTION: pt states INTOL to Prilosec  . Other     Pneumonia vaccine---felt tired and achy and sick x 6 months to get over this.  . Penicillins Hives  . Simvastatin     Myalgias   . Shellfish-Derived Products Hives, Nausea Only and Rash   Prior to Admission medications   Medication Sig Start Date End Date Taking? Authorizing Provider  albuterol (PROVENTIL HFA;VENTOLIN HFA) 108 (90 BASE) MCG/ACT inhaler Inhale 2 puffs  into the lungs every 6 (six) hours as needed for wheezing or shortness of breath. 05/20/14  Yes Burnis Medin, MD  amLODipine (NORVASC) 10 MG tablet Take 1 tablet (10 mg total) by mouth daily. 11/01/13  Yes Laurey Morale, MD  aspirin EC 81 MG tablet Take 81 mg by mouth daily.   Yes Historical Provider, MD  azithromycin (ZITHROMAX Z-PAK) 250 MG tablet Take as directed 09/11/14  Yes Laurey Morale, MD  Calcium Carbonate-Vitamin D 600-400 MG-UNIT per tablet Take 1 tablet by mouth 2 (two) times daily.     Yes Historical Provider, MD  Cholecalciferol (VITAMIN D3) 2000 UNITS TABS Take 2,000 Units by mouth daily.     Yes Historical Provider, MD  doxycycline (VIBRA-TABS) 100 MG tablet Take 1 tablet (100 mg total) by mouth 2 (two) times daily. For sinus infection 05/20/14  Yes Burnis Medin, MD  fluocinonide cream (LIDEX) 6.81 % Apply 1 application topically daily. 07/07/13  Yes Historical Provider, MD  fluticasone (FLONASE) 50 MCG/ACT nasal spray Place 2 sprays into both nostrils daily as needed. 09/14/13  Yes Laurey Morale, MD  HYDROcodone-homatropine [facility]) 5-1.5 MG/5ML syrup 1 tsp at night or every 4-6 hours if needed for cough 05/20/14  Yes Burnis Medin, MD  HYDROcodone-homatropine (HYDROMET) 5-1.5 MG/5ML syrup Take 5 mLs by mouth every 4 (four) hours as needed. 09/08/14  Yes Laurey Morale, MD  lansoprazole (PREVACID) 15 MG capsule Take 30 mg by mouth daily. Take 30 minutes prior to breakfast   Yes Historical Provider, MD  LORazepam (ATIVAN) 1 MG tablet Take 0.5 tablets (0.5 mg total) by mouth at bedtime as needed for anxiety. 06/27/14  Yes Laurey Morale, MD  losartan (COZAAR) 50 MG tablet Take 1 tablet (50 mg total) by mouth daily. 11/01/13  Yes Laurey Morale, MD  methylPREDNISolone (MEDROL DOSEPAK) 4 MG TBPK tablet Take as directed 09/11/14  Yes Laurey Morale, MD  metoprolol succinate (TOPROL XL) 25 MG 24 hr tablet Take 1 tablet (25 mg total) by mouth daily. 11/01/13  Yes Laurey Morale, MD  ondansetron (ZOFRAN)  4 MG tablet Take 1 tablet (4 mg total) by mouth every 4 (four) hours as needed for nausea or vomiting. 03/29/13  Yes Noralee Space, MD  Probiotic Product (ALIGN) 4 MG CAPS Take 1 capsule by mouth daily.   Yes Historical Provider, MD  promethazine (PHENERGAN) 25 MG tablet 1/2-1 tab po every  4 hours as needed 03/29/13  Yes Noralee Space, MD  rosuvastatin (CRESTOR) 10 MG tablet Take 1 tablet (10 mg total) by mouth daily. 05/09/14  Yes Laurey Morale, MD     ROS: The patient denies night sweats, unintentional weight loss, chest pain, palpitations, wheezing, dyspnea on exertion, vomiting, abdominal pain, dysuria, hematuria, melena, numbness, weakness, or tingling.  + nausea due to cough, chills   All other systems have been reviewed and were otherwise negative with the exception of those mentioned in the HPI and as above.    PHYSICAL EXAM: Filed Vitals:   09/12/14 1653  BP: 148/68  Pulse: 93  Temp: 98 F (36.7 C)  Resp: 17   Filed Vitals:   09/12/14 1653  Height: 5\' 6"  (1.676 m)  Weight: 162 lb 12.8 oz (73.846 kg)   Body mass index is 26.29 kg/(m^2).   General: Alert, no acute distress HEENT:  Normocephalic, atraumatic, oropharynx patent. EOMI, PERRLA Erythematous throat, no exudates, TM normal, + sinus tenderness, + erythematous/boggy nasal mucosa Cardiovascular:  Regular rate and rhythm, no rubs murmurs or gallops.  No Carotid bruits, radial pulse intact. No pedal edema.  Respiratory: Clear to auscultation bilaterally.  No wheezes, rales, or rhonchi.  No cyanosis, no use of accessory musculature GI: No organomegaly, abdomen is soft and non-tender, positive bowel sounds.  No masses. Skin: No rashes. Neurologic: Facial musculature symmetric. Psychiatric: Patient is appropriate throughout our interaction. Lymphatic: No cervical lymphadenopathy Musculoskeletal: Gait intact.   LABS: Results for orders placed or performed during the hospital encounter of 06/09/14  CBC  Result Value  Ref Range   WBC 10.1 4.0 - 10.5 K/uL   RBC 4.66 3.87 - 5.11 MIL/uL   Hemoglobin 14.6 12.0 - 15.0 g/dL   HCT 44.3 36.0 - 46.0 %   MCV 95.1 78.0 - 100.0 fL   MCH 31.3 26.0 - 34.0 pg   MCHC 33.0 30.0 - 36.0 g/dL   RDW 14.0 11.5 - 15.5 %   Platelets 263 150 - 400 K/uL  BNP (order ONLY if patient complains of dyspnea/SOB AND you have documented it for THIS visit)  Result Value Ref Range   B Natriuretic Peptide 98.2 0.0 - 100.0 pg/mL  Basic metabolic panel  Result Value Ref Range   Sodium 137 135 - 145 mmol/L   Potassium 3.6 3.5 - 5.1 mmol/L   Chloride 104 96 - 112 mmol/L   CO2 23 19 - 32 mmol/L   Glucose, Bld 119 (H) 70 - 99 mg/dL   BUN 23 6 - 23 mg/dL   Creatinine, Ser 0.84 0.50 - 1.10 mg/dL   Calcium 9.3 8.4 - 10.5 mg/dL   GFR calc non Af Amer 63 (L) >90 mL/min   GFR calc Af Amer 73 (L) >90 mL/min   Anion gap 10 5 - 15  Urinalysis, Routine w reflex microscopic  Result Value Ref Range   Color, Urine YELLOW YELLOW   APPearance CLEAR CLEAR   Specific Gravity, Urine 1.006 1.005 - 1.030   pH 7.0 5.0 - 8.0   Glucose, UA NEGATIVE NEGATIVE mg/dL   Hgb urine dipstick NEGATIVE NEGATIVE   Bilirubin Urine NEGATIVE NEGATIVE   Ketones, ur NEGATIVE NEGATIVE mg/dL   Protein, ur NEGATIVE NEGATIVE mg/dL   Urobilinogen, UA 0.2 0.0 - 1.0 mg/dL   Nitrite NEGATIVE NEGATIVE   Leukocytes, UA NEGATIVE NEGATIVE  Troponin I  Result Value Ref Range   Troponin I <0.03 <0.031 ng/mL  I-stat troponin, ED (not at [facility])  Result Value Ref Range   Troponin i, poc 0.00 0.00 - 0.08 ng/mL   Comment 3          CBG monitoring, ED  Result Value Ref Range   Glucose-Capillary 103 (H) 70 - 99 mg/dL     EKG/XRAY:   Primary read interpreted by Dr. Marin Comment at [facility]. Increase right lower lobe markings vs PNA.  Left upper lobe increase vascular markings   ASSESSMENT/PLAN: Encounter Diagnoses  Name Primary?  . Cough   . Wheezing   . Lower respiratory infection Yes  . Acute ethmoidal sinusitis, recurrence not  specified    Acute bronchitis Rx medrol dose pack, doxycycline, tessalon perles Fu prn   Gross sideeffects, risk and benefits, and alternatives of medications d/w patient. Patient is aware that all medications have potential sideeffects and we are unable to predict every sideeffect or drug-drug interaction that may occur.  Leotis Pain, DO 09/13/2014 11:54 AM  Spoke with patient about official xray results.

## 2014-09-14 ENCOUNTER — Emergency Department (HOSPITAL_COMMUNITY): Payer: PPO

## 2014-09-14 ENCOUNTER — Encounter (HOSPITAL_COMMUNITY): Payer: Self-pay | Admitting: Family Medicine

## 2014-09-14 ENCOUNTER — Emergency Department (HOSPITAL_COMMUNITY)
Admission: EM | Admit: 2014-09-14 | Discharge: 2014-09-14 | Disposition: A | Discharge status: Home / Self Care | Payer: PPO | Attending: Emergency Medicine | Admitting: Emergency Medicine

## 2014-09-14 DIAGNOSIS — E785 Hyperlipidemia, unspecified: Secondary | ICD-10-CM | POA: Diagnosis not present

## 2014-09-14 DIAGNOSIS — G8929 Other chronic pain: Secondary | ICD-10-CM | POA: Diagnosis not present

## 2014-09-14 DIAGNOSIS — Z880 Allergy status to penicillin: Secondary | ICD-10-CM | POA: Diagnosis not present

## 2014-09-14 DIAGNOSIS — K219 Gastro-esophageal reflux disease without esophagitis: Secondary | ICD-10-CM | POA: Insufficient documentation

## 2014-09-14 DIAGNOSIS — Z9889 Other specified postprocedural states: Secondary | ICD-10-CM | POA: Diagnosis not present

## 2014-09-14 DIAGNOSIS — I10 Essential (primary) hypertension: Secondary | ICD-10-CM | POA: Diagnosis not present

## 2014-09-14 DIAGNOSIS — R0989 Other specified symptoms and signs involving the circulatory and respiratory systems: Secondary | ICD-10-CM | POA: Diagnosis not present

## 2014-09-14 DIAGNOSIS — R05 Cough: Secondary | ICD-10-CM | POA: Diagnosis present

## 2014-09-14 DIAGNOSIS — M810 Age-related osteoporosis without current pathological fracture: Secondary | ICD-10-CM | POA: Diagnosis not present

## 2014-09-14 DIAGNOSIS — J209 Acute bronchitis, unspecified: Secondary | ICD-10-CM | POA: Diagnosis not present

## 2014-09-14 DIAGNOSIS — F419 Anxiety disorder, unspecified: Secondary | ICD-10-CM | POA: Diagnosis not present

## 2014-09-14 DIAGNOSIS — Z87448 Personal history of other diseases of urinary system: Secondary | ICD-10-CM | POA: Diagnosis not present

## 2014-09-14 DIAGNOSIS — Z7952 Long term (current) use of systemic steroids: Secondary | ICD-10-CM | POA: Diagnosis not present

## 2014-09-14 DIAGNOSIS — I2510 Atherosclerotic heart disease of native coronary artery without angina pectoris: Secondary | ICD-10-CM | POA: Diagnosis not present

## 2014-09-14 DIAGNOSIS — M1990 Unspecified osteoarthritis, unspecified site: Secondary | ICD-10-CM | POA: Diagnosis not present

## 2014-09-14 DIAGNOSIS — Z86010 Personal history of colonic polyps: Secondary | ICD-10-CM | POA: Diagnosis not present

## 2014-09-14 DIAGNOSIS — Z792 Long term (current) use of antibiotics: Secondary | ICD-10-CM | POA: Insufficient documentation

## 2014-09-14 DIAGNOSIS — Z7951 Long term (current) use of inhaled steroids: Secondary | ICD-10-CM | POA: Diagnosis not present

## 2014-09-14 DIAGNOSIS — R062 Wheezing: Secondary | ICD-10-CM | POA: Insufficient documentation

## 2014-09-14 DIAGNOSIS — Z79899 Other long term (current) drug therapy: Secondary | ICD-10-CM | POA: Insufficient documentation

## 2014-09-14 DIAGNOSIS — Z862 Personal history of diseases of the blood and blood-forming organs and certain disorders involving the immune mechanism: Secondary | ICD-10-CM | POA: Diagnosis not present

## 2014-09-14 LAB — CBC WITH DIFFERENTIAL/PLATELET
Basophils Absolute: 0 10*3/uL (ref 0.0–0.1)
Basophils Relative: 0 % (ref 0–1)
EOS ABS: 0 10*3/uL (ref 0.0–0.7)
EOS PCT: 0 % (ref 0–5)
HCT: 45.9 % (ref 36.0–46.0)
HEMOGLOBIN: 14.8 g/dL (ref 12.0–15.0)
LYMPHS PCT: 27 % (ref 12–46)
Lymphs Abs: 1.3 10*3/uL (ref 0.7–4.0)
MCH: 30.6 pg (ref 26.0–34.0)
MCHC: 32.2 g/dL (ref 30.0–36.0)
MCV: 94.8 fL (ref 78.0–100.0)
MONO ABS: 0.1 10*3/uL (ref 0.1–1.0)
MONOS PCT: 2 % — AB (ref 3–12)
Neutro Abs: 3.4 10*3/uL (ref 1.7–7.7)
Neutrophils Relative %: 71 % (ref 43–77)
Platelets: 210 10*3/uL (ref 150–400)
RBC: 4.84 MIL/uL (ref 3.87–5.11)
RDW: 13.6 % (ref 11.5–15.5)
WBC: 4.8 10*3/uL (ref 4.0–10.5)

## 2014-09-14 LAB — I-STAT TROPONIN, ED: Troponin i, poc: 0 ng/mL (ref 0.00–0.08)

## 2014-09-14 LAB — BASIC METABOLIC PANEL
Anion gap: 10 (ref 5–15)
BUN: 19 mg/dL (ref 6–20)
CALCIUM: 9.1 mg/dL (ref 8.9–10.3)
CO2: 25 mmol/L (ref 22–32)
Chloride: 106 mmol/L (ref 101–111)
Creatinine, Ser: 0.73 mg/dL (ref 0.44–1.00)
GFR calc Af Amer: >60 mL/min (ref >60)
Glucose, Bld: 133 mg/dL — ABNORMAL HIGH (ref 65–99)
POTASSIUM: 4.2 mmol/L (ref 3.5–5.1)
Sodium: 141 mmol/L (ref 135–145)

## 2014-09-14 MED ORDER — IPRATROPIUM-ALBUTEROL 0.5-2.5 (3) MG/3ML IN SOLN
3.0000 mL | Freq: Once | RESPIRATORY_TRACT | Status: AC
  Administered 2014-09-14: 3 mL via RESPIRATORY_TRACT
  Filled 2014-09-14: qty 3

## 2014-09-14 MED ORDER — PREDNISONE 10 MG PO TABS: 40.0000 mg | ORAL_TABLET | Freq: Every day | ORAL | Status: DC

## 2014-09-14 MED ORDER — ALBUTEROL SULFATE HFA 108 (90 BASE) MCG/ACT IN AERS
2.0000 | INHALATION_SPRAY | Freq: Once | RESPIRATORY_TRACT | Status: AC
  Administered 2014-09-14: 2 via RESPIRATORY_TRACT
  Filled 2014-09-14: qty 6.7

## 2014-09-14 MED ORDER — METHYLPREDNISOLONE SODIUM SUCC 125 MG IJ SOLR
125.0000 mg | Freq: Once | INTRAMUSCULAR | Status: AC
  Administered 2014-09-14: 125 mg via INTRAVENOUS
  Filled 2014-09-14: qty 2

## 2014-09-14 NOTE — ED Notes (Signed)
Patient states she was diagnosed with bronchitis 2 days ago by urgent care. Pt states a chest x-ray was performed. Pt states she feels worse than 2 days ago with increased chest congestion, nausea, coughing. Denies fever. Pt has been taking prescribed Doxycycline.

## 2014-09-14 NOTE — Discharge Instructions (Signed)
Use the albuterol inhaler, 2 puffs every four hours as needed for wheezing.     Acute Bronchitis Bronchitis is inflammation of the airways that extend from the windpipe into the lungs (bronchi). The inflammation often causes mucus to develop. This leads to a cough, which is the most common symptom of bronchitis.  In acute bronchitis, the condition usually develops suddenly and goes away over time, usually in a couple weeks. Smoking, allergies, and asthma can make bronchitis worse. Repeated episodes of bronchitis may cause further lung problems.  CAUSES Acute bronchitis is most often caused by the same virus that causes a cold. The virus can spread from person to person (contagious) through coughing, sneezing, and touching contaminated objects. SIGNS AND SYMPTOMS   Cough.   Fever.   Coughing up mucus.   Body aches.   Chest congestion.   Chills.   Shortness of breath.   Sore throat.  DIAGNOSIS  Acute bronchitis is usually diagnosed through a physical exam. Your health care provider will also ask you questions about your medical history. Tests, such as chest X-rays, are sometimes done to rule out other conditions.  TREATMENT  Acute bronchitis usually goes away in a couple weeks. Oftentimes, no medical treatment is necessary. Medicines are sometimes given for relief of fever or cough. Antibiotic medicines are usually not needed but may be prescribed in certain situations. In some cases, an inhaler may be recommended to help reduce shortness of breath and control the cough. A cool mist vaporizer may also be used to help thin bronchial secretions and make it easier to clear the chest.  HOME CARE INSTRUCTIONS  Get plenty of rest.   Drink enough fluids to keep your urine clear or pale yellow (unless you have a medical condition that requires fluid restriction). Increasing fluids may help thin your respiratory secretions (sputum) and reduce chest congestion, and it will prevent  dehydration.   Take medicines only as directed by your health care provider.  If you were prescribed an antibiotic medicine, finish it all even if you start to feel better.  Avoid smoking and secondhand smoke. Exposure to cigarette smoke or irritating chemicals will make bronchitis worse. If you are a smoker, consider using nicotine gum or skin patches to help control withdrawal symptoms. Quitting smoking will help your lungs heal faster.   Reduce the chances of another bout of acute bronchitis by washing your hands frequently, avoiding people with cold symptoms, and trying not to touch your hands to your mouth, nose, or eyes.   Keep all follow-up visits as directed by your health care provider.  SEEK MEDICAL CARE IF: Your symptoms do not improve after 1 week of treatment.  SEEK IMMEDIATE MEDICAL CARE IF:  You develop an increased fever or chills.   You have chest pain.   You have severe shortness of breath.  You have bloody sputum.   You develop dehydration.  You faint or repeatedly feel like you are going to pass out.  You develop repeated vomiting.  You develop a severe headache. MAKE SURE YOU:   Understand these instructions.  Will watch your condition.  Will get help right away if you are not doing well or get worse. Document Released: 04/24/2004 Document Revised: 08/01/2013 Document Reviewed: 09/07/2012 [facility] Patient Information 2015 [place], [place]. This information is not intended to replace advice given to you by your health care provider. Make sure you discuss any questions you have with your health care provider.

## 2014-09-14 NOTE — ED Provider Notes (Signed)
CSN: [id number]     Arrival date & time 09/14/14  4765 History   First MD Initiated Contact with Patient 09/14/14 [phone]     Chief Complaint  Patient presents with  . Cough  . Bronchitis     Patient is a 81 y.o. female presenting with cough. The history is provided by the patient. No language interpreter was used.  Cough  Ms. Krystal [name] presents for evaluation of cough and chest congestion. She developed symptoms one week ago with cough productive of green sputum. She saw her PCP and was given a Z-Pak and a shot of steroids. Her symptoms worsened and she went to urgent care 2 days ago and was given doxycycline. She feels she's had a reaction to the doxycycline because now she has night sweats and she stopped taking that this morning. She was told she has bronchitis. She denies any fevers. She does have chest tightness and soreness related to the coughing. She denies any abdominal pain. She does have some nausea but no vomiting. No leg swelling or pain. Symptoms are moderate, constant, worsening.  Past Medical History  Diagnosis Date  . Hypertension   . CAD (coronary artery disease)   . Venous insufficiency   . Hyperlipidemia   . Hiatal hernia   . GERD (gastroesophageal reflux disease)   . Diverticulosis   . Adenomatous colon polyp   . Renal cysts, acquired, bilateral   . Osteoarthritis   . Chronic lower back pain   . Headache(784.0)   . Anxiety   . Allergy     seasonal  . Anemia     pt. denies  . Cataract     cataracts removed left eye.  . Osteoporosis   . IBS (irritable bowel syndrome) 10/13/2011   Past Surgical History  Procedure Laterality Date  . Abdominal hysterectomy    . Varicose vein surgery    . Bladder repair    . Colonoscopy  08/17/2008    adenomatous polyp, diverticulosis, external hemorrhoids  . Cataract extraction      bilateral  . Upper gastrointestinal endoscopy  07/03/2010    hiatal hernia  . Colonoscopy w/ biopsies      multiple   . Left lower extremity  venous doppler  06/18/2011    No evidence of left lower extremity DVT  . Cardiac catheterization  01/12/2002    90% stenosis of the left circumflex, stented with a 3x72mm Cypher stent, resulting in reduction of 90% to 10%   . Cardiovascular stress test  03/22/2007    Mild inferolateral thinning toward the apex without significant ischemia. Nondiagnostic electrocardiogram.  . Transthoracic echocardiogram  11/18/2012    EF 35-46%, LV systolic mild-moderately reduced, mild-moderate mitral valve regurg, mild-moderate tricuspid valve regurg. NSR-LBBB with occas PVCs   Family History  Problem Relation Age of Onset  . Heart disease Father   . Colon cancer Neg Hx   . Stroke Sister    History  Substance Use Topics  . Smoking status: Never Smoker   . Smokeless tobacco: Never Used  . Alcohol Use: No   OB History    No data available     Review of Systems  Respiratory: Positive for cough.   All other systems reviewed and are negative.     Allergies  Alendronate sodium; Cefdinir; Esomeprazole magnesium; Levofloxacin; Omeprazole; Other; Penicillins; Simvastatin; and Shellfish-derived products  Home Medications   Prior to Admission medications   Medication Sig Start Date End Date Taking? Authorizing Provider  doxycycline (VIBRA-TABS) 100 MG  tablet Take 1 tablet (100 mg total) by mouth 2 (two) times daily. 09/12/14  Yes Thao P Le, DO  albuterol (PROVENTIL HFA;VENTOLIN HFA) 108 (90 BASE) MCG/ACT inhaler Inhale 2 puffs into the lungs every 6 (six) hours as needed for wheezing or shortness of breath. 05/20/14   Burnis Medin, MD  amLODipine (NORVASC) 10 MG tablet Take 1 tablet (10 mg total) by mouth daily. 11/01/13   Laurey Morale, MD  aspirin EC 81 MG tablet Take 81 mg by mouth daily.    Historical Provider, MD  benzonatate (TESSALON) 100 MG capsule Take 2 capsules (200 mg total) by mouth 2 (two) times daily as needed. 09/12/14   Thao P Le, DO  Calcium Carbonate-Vitamin D 600-400 MG-UNIT per  tablet Take 1 tablet by mouth 2 (two) times daily.      Historical Provider, MD  Cholecalciferol (VITAMIN D3) 2000 UNITS TABS Take 2,000 Units by mouth daily.      Historical Provider, MD  fluocinonide cream (LIDEX) 1.61 % Apply 1 application topically daily. 07/07/13   Historical Provider, MD  fluticasone (FLONASE) 50 MCG/ACT nasal spray Place 2 sprays into both nostrils daily as needed. 09/14/13   Laurey Morale, MD  HYDROcodone-homatropine [facility]) 5-1.5 MG/5ML syrup 1 tsp at night or every 4-6 hours if needed for cough 05/20/14   Burnis Medin, MD  HYDROcodone-homatropine (HYDROMET) 5-1.5 MG/5ML syrup Take 5 mLs by mouth every 4 (four) hours as needed. 09/08/14   Laurey Morale, MD  lansoprazole (PREVACID) 15 MG capsule Take 30 mg by mouth daily. Take 30 minutes prior to breakfast    Historical Provider, MD  LORazepam (ATIVAN) 1 MG tablet Take 0.5 tablets (0.5 mg total) by mouth at bedtime as needed for anxiety. 06/27/14   Laurey Morale, MD  losartan (COZAAR) 50 MG tablet Take 1 tablet (50 mg total) by mouth daily. 11/01/13   Laurey Morale, MD  methylPREDNISolone (MEDROL DOSEPAK) 4 MG TBPK tablet Take as directed 09/11/14   Laurey Morale, MD  methylPREDNISolone (MEDROL DOSEPAK) 4 MG TBPK tablet Take as directed 09/12/14   Thao P Le, DO  metoprolol succinate (TOPROL XL) 25 MG 24 hr tablet Take 1 tablet (25 mg total) by mouth daily. 11/01/13   Laurey Morale, MD  ondansetron (ZOFRAN) 4 MG tablet Take 1 tablet (4 mg total) by mouth every 4 (four) hours as needed for nausea or vomiting. 03/29/13   Noralee Space, MD  Probiotic Product (ALIGN) 4 MG CAPS Take 1 capsule by mouth daily.    Historical Provider, MD  promethazine (PHENERGAN) 25 MG tablet 1/2-1 tab po every 4 hours as needed 03/29/13   Noralee Space, MD  rosuvastatin (CRESTOR) 10 MG tablet Take 1 tablet (10 mg total) by mouth daily. 05/09/14   Laurey Morale, MD   BP 171/78 mmHg  Pulse 73  Temp(Src) 97.8 F (36.6 C) (Oral)  Resp 18  Ht 5\' 6"  (1.676 m)   Wt 160 lb (72.576 kg)  BMI 25.84 kg/m2  SpO2 98% Physical Exam  Constitutional: She is oriented to person, place, and time. She appears well-developed and well-nourished.  HENT:  Head: Normocephalic and atraumatic.  Cardiovascular: Normal rate and regular rhythm.   No murmur heard. Pulmonary/Chest: Effort normal. No respiratory distress.  End expiratory wheezes in all lung fields  Abdominal: Soft. There is no tenderness. There is no rebound and no guarding.  Musculoskeletal: She exhibits no edema or tenderness.  Neurological: She is  alert and oriented to person, place, and time.  Skin: Skin is warm and dry.  Psychiatric:  Anxious  Nursing note and vitals reviewed.   ED Course  Procedures (including critical care time) Labs Review Labs Reviewed  BASIC METABOLIC PANEL - Abnormal; Notable for the following:    Glucose, Bld 133 (*)    All other components within normal limits  CBC WITH DIFFERENTIAL/PLATELET - Abnormal; Notable for the following:    Monocytes Relative 2 (*)    All other components within normal limits  I-STAT TROPOININ, ED    Imaging Review Dg Chest 2 View  09/12/2014   CLINICAL DATA:  Seven day history of coughing and URI symptoms, thick green mucus, wheezing, sinus pressure, fever, chills  EXAM: CHEST  2 VIEW  COMPARISON:  None  FINDINGS: Normal heart size and pulmonary vascularity.  Calcified tortuous thoracic aorta.  Bronchitic changes and mild hyperinflation.  No definite acute infiltrate, pleural effusion or pneumothorax.  Bones demineralized.  IMPRESSION: Bronchitic changes without acute infiltrate.   Electronically Signed   By: Lavonia Dana M.D.   On: 09/12/2014 19:28     EKG Interpretation   Date/Time:  Thursday September 14 2014 10:25:10 EDT Ventricular Rate:  70 PR Interval:  192 QRS Duration: 165 QT Interval:  492 QTC Calculation: 531 R Axis:   2 Text Interpretation:  Sinus rhythm Left bundle branch block Baseline  wander in lead(s) V2 Confirmed by  Hazle Coca [phone]) on 09/14/2014 10:57:51  AM      MDM   Final diagnoses:  Acute bronchitis, unspecified organism    Patient here for evaluation of chest discomfort and shortness of breath, ongoing for the last 5 days. Patient with diffuse wheezing on initial evaluation, completely resolved after nebulizer treatment. She has been on antibiotics that she has not had a trial of nebulizers and steroids at home. Examination is consistent with bronchospasm, there is no evidence of acute infection at this time. History and presentation is not consistent with ACS, CHF, PE. Discussed with patient care with albuterol MDI when necessary wheezing as well as steroid therapy. Close return precautions were discussed.    Quintella Reichert, MD 09/15/14 [phone]

## 2014-09-19 ENCOUNTER — Ambulatory Visit: Payer: PPO | Admitting: Family Medicine

## 2014-09-25 ENCOUNTER — Telehealth: Payer: Self-pay | Admitting: Internal Medicine

## 2014-09-25 MED ORDER — LANSOPRAZOLE 15 MG PO CPDR: DELAYED_RELEASE_CAPSULE | ORAL | Status: DC

## 2014-09-25 NOTE — Telephone Encounter (Signed)
rx sent in as requested , see last office note.

## 2014-09-27 ENCOUNTER — Telehealth: Payer: Self-pay

## 2014-09-27 NOTE — Telephone Encounter (Signed)
Filled out Quantity Limit Exception  request form for Lansoprazole capsules.  Dx: GERD, K21.9.  Faxed to [phone].  Awaiting response.

## 2014-09-28 NOTE — Telephone Encounter (Signed)
Received fax that Lansoprazole 15mg  capsule approved from 09/27/13- 03/31/15.

## 2014-12-05 ENCOUNTER — Other Ambulatory Visit: Payer: Self-pay | Admitting: Obstetrics and Gynecology

## 2014-12-05 DIAGNOSIS — M810 Age-related osteoporosis without current pathological fracture: Secondary | ICD-10-CM

## 2015-01-05 ENCOUNTER — Telehealth: Payer: Self-pay | Admitting: Internal Medicine

## 2015-01-05 NOTE — Telephone Encounter (Signed)
Informed patient of this.  °

## 2015-01-05 NOTE — Telephone Encounter (Signed)
She should get from her primary care.  We have a limited number of doses for IBD patients

## 2015-02-20 ENCOUNTER — Encounter: Payer: Self-pay | Admitting: Cardiovascular Disease

## 2015-02-20 ENCOUNTER — Ambulatory Visit (INDEPENDENT_AMBULATORY_CARE_PROVIDER_SITE_OTHER): Payer: PPO | Admitting: Cardiovascular Disease

## 2015-02-20 VITALS — BP 160/88 | HR 68 | Ht 64.0 in | Wt 162.0 lb

## 2015-02-20 DIAGNOSIS — E785 Hyperlipidemia, unspecified: Secondary | ICD-10-CM

## 2015-02-20 DIAGNOSIS — I10 Essential (primary) hypertension: Secondary | ICD-10-CM | POA: Diagnosis not present

## 2015-02-20 LAB — HEPATIC FUNCTION PANEL
ALT: 18 U/L (ref 6–29)
AST: 22 U/L (ref 10–35)
Albumin: 4.2 g/dL (ref 3.6–5.1)
Alkaline Phosphatase: 111 U/L (ref 33–130)
Bilirubin, Direct: 0.1 mg/dL (ref <=0.2)
Indirect Bilirubin: 0.5 mg/dL (ref 0.2–1.2)
Total Bilirubin: 0.6 mg/dL (ref 0.2–1.2)
Total Protein: 6.5 g/dL (ref 6.1–8.1)

## 2015-02-20 LAB — LIPID PANEL
Cholesterol: 151 mg/dL (ref 125–200)
HDL: 48 mg/dL (ref >=46)
LDL Cholesterol: 75 mg/dL (ref <130)
Total CHOL/HDL Ratio: 3.1 Ratio (ref <=5.0)
Triglycerides: 139 mg/dL (ref <150)
VLDL: 28 mg/dL (ref <30)

## 2015-02-20 NOTE — Assessment & Plan Note (Signed)
Patient's hypertension blood pressure measured today at 166/92. She does check her blood pressure at home which usually runs in the mid 130s /75.Marland Kitchen She is on losartan, amlodipine and metoprolol. Continue current meds at current dosing

## 2015-02-20 NOTE — Patient Instructions (Signed)
Medication Instructions:  Your physician recommends that you continue on your current medications as directed. Please refer to the Current Medication list given to you today.   Labwork: Your physician recommends that you return for lab work in: TODAY The lab can be found on the FIRST FLOOR of out building in [address]   Testing/Procedures: none  Follow-Up: Your physician wants you to follow-up in: 12 months with Dr. Gwenlyn Found. You will receive a reminder letter in the mail two months in advance. If you don't receive a letter, please call our office to schedule the follow-up appointment.   Any Other Special Instructions Will Be Listed Below (If Applicable).     If you need a refill on your cardiac medications before your next appointment, please call your pharmacy.

## 2015-02-20 NOTE — Assessment & Plan Note (Signed)
History of CAD status post circumflex PCI and stenting using a drug-eluting stent by Dr. Eustace Quail in 2003. She denies chest pain or shortness of breath.

## 2015-02-20 NOTE — Progress Notes (Signed)
02/20/2015 Krystal [name]   1929/06/28  [id number]  Primary Physician Krystal Morale, MD Primary Cardiologist: Krystal Harp MD Krystal [name]   HPI:   Krystal [name] is a 81 year old mildly overweight married Caucasian female mother of 2 children, grandmother of 3 grandchildren, whose husband Krystal [name] is also a patient of mine. They were both patients of Dr. Georgiann [name]. I last saw her in the office 07/18/13. Her past history is remarkable for treated hypertension and hyperlipidemia. Her father did die of a myocardial infarction. She has never had a heart attack or stroke. She does not smoke. She had a Cypher drug-eluting stent placed in her circumflex coronary artery by Dr. Eustace [name] in 2003. Her other arteries were normal as was her LV function. She denies chest pain or shortness of breath. She is very active and walks for an hour every morning.    Current Outpatient Prescriptions  Medication Sig Dispense Refill  . acetaminophen (TYLENOL) 500 MG tablet Take 500 mg by mouth every 4 (four) hours as needed for mild pain or headache.    . albuterol (PROVENTIL HFA;VENTOLIN HFA) 108 (90 BASE) MCG/ACT inhaler Inhale 2 puffs into the lungs every 6 (six) hours as needed for wheezing or shortness of breath. 1 Inhaler 1  . amLODipine (NORVASC) 10 MG tablet Take 1 tablet (10 mg total) by mouth daily. 90 tablet 3  . aspirin EC 81 MG tablet Take 81 mg by mouth daily.    . benzonatate (TESSALON) 100 MG capsule Take 2 capsules (200 mg total) by mouth 2 (two) times daily as needed. 30 capsule 1  . calcium carbonate (OS-CAL) 600 MG TABS tablet Take 600 mg by mouth 2 (two) times daily with a meal.    . cholecalciferol (VITAMIN D) 1000 UNITS tablet Take 1,000 Units by mouth daily.    Marland Kitchen doxycycline (VIBRA-TABS) 100 MG tablet Take 1 tablet (100 mg total) by mouth 2 (two) times daily. (Patient taking differently: Take 100 mg by mouth 2 (two) times daily. For 10 days) 20 tablet 0  . fluticasone  (FLONASE) 50 MCG/ACT nasal spray Place 2 sprays into both nostrils daily as needed. (Patient taking differently: Place 2 sprays into both nostrils daily as needed for allergies. ) 16 g 11  . HYDROcodone-homatropine (HYCODAN) 5-1.5 MG/5ML syrup 1 tsp at night or every 4-6 hours if needed for cough 180 mL 0  . lansoprazole (PREVACID) 15 MG capsule Take 2 capsules every AM 30 minutes before breakfast. 180 capsule 1  . LORazepam (ATIVAN) 1 MG tablet Take 0.5 tablets (0.5 mg total) by mouth at bedtime as needed for anxiety. 14 tablet 0  . losartan (COZAAR) 50 MG tablet Take 1 tablet (50 mg total) by mouth daily. 90 tablet 3  . methylPREDNISolone (MEDROL DOSEPAK) 4 MG TBPK tablet Take as directed 21 tablet 0  . metoprolol succinate (TOPROL XL) 25 MG 24 hr tablet Take 1 tablet (25 mg total) by mouth daily. 90 tablet 3  . predniSONE (DELTASONE) 10 MG tablet Take 4 tablets (40 mg total) by mouth daily. Start taking on 09/15/14 12 tablet 0  . Probiotic Product (ALIGN) 4 MG CAPS Take 1 capsule by mouth daily.    . promethazine (PHENERGAN) 25 MG tablet 1/2-1 tab po every 4 hours as needed 12 tablet 0  . simvastatin (ZOCOR) 40 MG tablet Take 40 mg by mouth daily.     Current Facility-Administered Medications  Medication Dose Route Frequency Provider Last Rate Last Dose  .  0.9 %  sodium chloride infusion  500 mL Intravenous Continuous Krystal Mayer, MD        Allergies  Allergen Reactions  . Alendronate Sodium     REACTION: pt states INTOL to Fosamax w/ esophagitis  . Cefdinir     REACTION: diarrhea  . Esomeprazole Magnesium     REACTION: pt states INTOL to Nexium  . Levofloxacin     REACTION: diarrhea  . Omeprazole     REACTION: pt states INTOL to Prilosec  . Other     Pneumonia vaccine---felt tired and achy and sick x 6 months to get over this.  . Penicillins Hives  . Shellfish-Derived Products Hives, Nausea Only and Rash    Social History   Social History  . Marital Status: Married     Spouse Name: Krystal [name]  . Number of Children: N/A  . Years of Education: N/A   Occupational History  . retired    Social History Main Topics  . Smoking status: Never Smoker   . Smokeless tobacco: Never Used  . Alcohol Use: No  . Drug Use: No  . Sexual Activity: Not on file   Other Topics Concern  . Not on file   Social History Narrative     Review of Systems: General: negative for chills, fever, night sweats or weight changes.  Cardiovascular: negative for chest pain, dyspnea on exertion, edema, orthopnea, palpitations, paroxysmal nocturnal dyspnea or shortness of breath Dermatological: negative for rash Respiratory: negative for cough or wheezing Urologic: negative for hematuria Abdominal: negative for nausea, vomiting, diarrhea, bright red blood per rectum, melena, or hematemesis Neurologic: negative for visual changes, syncope, or dizziness All other systems reviewed and are otherwise negative except as noted above.    Blood pressure 160/88, pulse 68, height 5\' 4"  (1.626 m), weight 162 lb (73.483 kg).  General appearance: alert and no distress Neck: no adenopathy, no carotid bruit, no JVD, supple, symmetrical, trachea midline and thyroid not enlarged, symmetric, no tenderness/mass/nodules Lungs: clear to auscultation bilaterally Heart: regular rate and rhythm, S1, S2 normal, no murmur, click, rub or gallop Extremities: extremities normal, atraumatic, no cyanosis or edema  EKG sinus rhythm at 68 with left bundle branch block. I personally reviewed this EKG  ASSESSMENT AND PLAN:   Hyperlipidemia History of hyperlipidemia on simvastatin. We will we'll recheck a lipid and liver profile  Essential hypertension Patient's hypertension blood pressure measured today at 166/92. She does check her blood pressure at home which usually runs in the mid 130s /75.Marland Kitchen She is on losartan, amlodipine and metoprolol. Continue current meds at current dosing  Coronary  atherosclerosis History of CAD status post circumflex PCI and stenting using a drug-eluting stent by Dr. Eustace [name] in 2003. She denies chest pain or shortness of breath.      Krystal Harp MD FACP,FACC,FAHA, [facility] 02/20/2015 9:23 AM

## 2015-02-20 NOTE — Assessment & Plan Note (Signed)
History of hyperlipidemia on simvastatin. We will we'll recheck a lipid and liver profile

## 2015-03-01 ENCOUNTER — Encounter: Payer: Self-pay | Admitting: Cardiology

## 2015-03-07 ENCOUNTER — Ambulatory Visit
Admission: RE | Admit: 2015-03-07 | Discharge: 2015-03-07 | Disposition: A | Discharge status: Home / Self Care | Payer: PPO | Source: Ambulatory Visit | Attending: Obstetrics and Gynecology | Admitting: Obstetrics and Gynecology

## 2015-03-07 DIAGNOSIS — M810 Age-related osteoporosis without current pathological fracture: Secondary | ICD-10-CM

## 2015-03-30 ENCOUNTER — Ambulatory Visit
Admission: RE | Admit: 2015-03-30 | Discharge: 2015-03-30 | Disposition: A | Discharge status: Home / Self Care | Payer: PPO | Source: Ambulatory Visit | Attending: Obstetrics and Gynecology | Admitting: Obstetrics and Gynecology

## 2015-04-04 DIAGNOSIS — R300 Dysuria: Secondary | ICD-10-CM | POA: Diagnosis not present

## 2015-04-09 DIAGNOSIS — M810 Age-related osteoporosis without current pathological fracture: Secondary | ICD-10-CM | POA: Diagnosis not present

## 2015-04-19 DIAGNOSIS — N390 Urinary tract infection, site not specified: Secondary | ICD-10-CM | POA: Diagnosis not present

## 2015-04-19 DIAGNOSIS — M810 Age-related osteoporosis without current pathological fracture: Secondary | ICD-10-CM | POA: Diagnosis not present

## 2015-04-26 ENCOUNTER — Telehealth: Payer: Self-pay | Admitting: Family Medicine

## 2015-04-26 NOTE — Telephone Encounter (Signed)
Pt & husband Krystal [name] ([id number]) called wanting to transfer from Dr. Sarajane Jews to Dr. Ronnald Ramp. Please advise

## 2015-04-26 NOTE — Telephone Encounter (Signed)
That’s fine with me

## 2015-05-08 NOTE — Telephone Encounter (Signed)
Dr. Ronnald Ramp approved

## 2015-05-23 ENCOUNTER — Encounter: Payer: Self-pay | Admitting: Internal Medicine

## 2015-05-23 ENCOUNTER — Other Ambulatory Visit (INDEPENDENT_AMBULATORY_CARE_PROVIDER_SITE_OTHER): Payer: PPO

## 2015-05-23 ENCOUNTER — Ambulatory Visit (INDEPENDENT_AMBULATORY_CARE_PROVIDER_SITE_OTHER): Payer: PPO | Admitting: Internal Medicine

## 2015-05-23 VITALS — BP 142/78 | HR 69 | Temp 97.6°F | Resp 16 | Ht 64.0 in | Wt 162.0 lb

## 2015-05-23 DIAGNOSIS — I10 Essential (primary) hypertension: Secondary | ICD-10-CM | POA: Diagnosis not present

## 2015-05-23 DIAGNOSIS — J301 Allergic rhinitis due to pollen: Secondary | ICD-10-CM

## 2015-05-23 DIAGNOSIS — E785 Hyperlipidemia, unspecified: Secondary | ICD-10-CM | POA: Diagnosis not present

## 2015-05-23 DIAGNOSIS — R739 Hyperglycemia, unspecified: Secondary | ICD-10-CM | POA: Diagnosis not present

## 2015-05-23 DIAGNOSIS — F411 Generalized anxiety disorder: Secondary | ICD-10-CM

## 2015-05-23 DIAGNOSIS — R7303 Prediabetes: Secondary | ICD-10-CM | POA: Insufficient documentation

## 2015-05-23 LAB — BASIC METABOLIC PANEL
BUN: 19 mg/dL (ref 6–23)
CO2: 27 meq/L (ref 19–32)
CREATININE: 0.9 mg/dL (ref 0.40–1.20)
Calcium: 9.4 mg/dL (ref 8.4–10.5)
Chloride: 103 mEq/L (ref 96–112)
GFR: 63.5 mL/min (ref >60.00)
GLUCOSE: 99 mg/dL (ref 70–99)
Potassium: 4.5 mEq/L (ref 3.5–5.1)
Sodium: 138 mEq/L (ref 135–145)

## 2015-05-23 LAB — HEMOGLOBIN A1C: Hgb A1c MFr Bld: 5.9 % (ref 4.6–6.5)

## 2015-05-23 MED ORDER — FLUTICASONE PROPIONATE 50 MCG/ACT NA SUSP: 2.0000 | Freq: Every day | NASAL | Status: DC | PRN

## 2015-05-23 MED ORDER — LORAZEPAM 1 MG PO TABS: 0.5000 mg | ORAL_TABLET | Freq: Every evening | ORAL | Status: DC | PRN

## 2015-05-23 NOTE — Progress Notes (Signed)
Pre visit review using our clinic review tool, if applicable. No additional management support is needed unless otherwise documented below in the visit note. 

## 2015-05-23 NOTE — Patient Instructions (Signed)
Hypertension Hypertension, commonly called high blood pressure, is when the force of blood pumping through your arteries is too strong. Your arteries are the blood vessels that carry blood from your heart throughout your body. A blood pressure reading consists of a higher number over a lower number, such as 110/72. The higher number (systolic) is the pressure inside your arteries when your heart pumps. The lower number (diastolic) is the pressure inside your arteries when your heart relaxes. Ideally you want your blood pressure below 120/80. Hypertension forces your heart to work harder to pump blood. Your arteries may become narrow or stiff. Having untreated or uncontrolled hypertension can cause heart attack, stroke, kidney disease, and other problems. RISK FACTORS Some risk factors for high blood pressure are controllable. Others are not.  Risk factors you cannot control include:   Race. You may be at higher risk if you are African American.  Age. Risk increases with age.  Gender. Men are at higher risk than women before age 45 years. After age 65, women are at higher risk than men. Risk factors you can control include:  Not getting enough exercise or physical activity.  Being overweight.  Getting too much fat, sugar, calories, or salt in your diet.  Drinking too much alcohol. SIGNS AND SYMPTOMS Hypertension does not usually cause signs or symptoms. Extremely high blood pressure (hypertensive crisis) may cause headache, anxiety, shortness of breath, and nosebleed. DIAGNOSIS To check if you have hypertension, your health care provider will measure your blood pressure while you are seated, with your arm held at the level of your heart. It should be measured at least twice using the same arm. Certain conditions can cause a difference in blood pressure between your right and left arms. A blood pressure reading that is higher than normal on one occasion does not mean that you need treatment. If  it is not clear whether you have high blood pressure, you may be asked to return on a different day to have your blood pressure checked again. Or, you may be asked to monitor your blood pressure at home for 1 or more weeks. TREATMENT Treating high blood pressure includes making lifestyle changes and possibly taking medicine. Living a healthy lifestyle can help lower high blood pressure. You may need to change some of your habits. Lifestyle changes may include:  Following the DASH diet. This diet is high in fruits, vegetables, and whole grains. It is low in salt, red meat, and added sugars.  Keep your sodium intake below 2,300 mg per day.  Getting at least 30-45 minutes of aerobic exercise at least 4 times per week.  Losing weight if necessary.  Not smoking.  Limiting alcoholic beverages.  Learning ways to reduce stress. Your health care provider may prescribe medicine if lifestyle changes are not enough to get your blood pressure under control, and if one of the following is true:  You are 18-59 years of age and your systolic blood pressure is above 140.  You are 60 years of age or older, and your systolic blood pressure is above 150.  Your diastolic blood pressure is above 90.  You have diabetes, and your systolic blood pressure is over 140 or your diastolic blood pressure is over 90.  You have kidney disease and your blood pressure is above 140/90.  You have heart disease and your blood pressure is above 140/90. Your personal target blood pressure may vary depending on your medical conditions, your age, and other factors. HOME CARE INSTRUCTIONS    Have your blood pressure rechecked as directed by your health care provider.   Take medicines only as directed by your health care provider. Follow the directions carefully. Blood pressure medicines must be taken as prescribed. The medicine does not work as well when you skip doses. Skipping doses also puts you at risk for  problems.  Do not smoke.   Monitor your blood pressure at home as directed by your health care provider. SEEK MEDICAL CARE IF:   You think you are having a reaction to medicines taken.  You have recurrent headaches or feel dizzy.  You have swelling in your ankles.  You have trouble with your vision. SEEK IMMEDIATE MEDICAL CARE IF:  You develop a severe headache or confusion.  You have unusual weakness, numbness, or feel faint.  You have severe chest or abdominal pain.  You vomit repeatedly.  You have trouble breathing. MAKE SURE YOU:   Understand these instructions.  Will watch your condition.  Will get help right away if you are not doing well or get worse.   This information is not intended to replace advice given to you by your health care provider. Make sure you discuss any questions you have with your health care provider.   Document Released: 03/17/2005 Document Revised: 08/01/2014 Document Reviewed: 01/07/2013 Elsevier Interactive Patient Education 2016 Elsevier Inc.  

## 2015-05-23 NOTE — Progress Notes (Signed)
Subjective:  Patient ID: Krystal [name], female    DOB: [birth year]  Age: 82 y.o. MRN: [id number]  CC: Hypertension  NEW TO ME  HPI Krystal [name] presents for follow-up on hypertension and nasal allergies.   She tells me her blood pressure at home has been well controlled with numbers around 130-140/65-75. She is doing well on the combination of an ARB, calcium channel blocker and beta blocker. She is followed closely by cardiology for coronary artery disease. She has no side effects related to this therapy.  She also complains of chronic nasal symptoms with congestion, sneezing, runny nose, and postnasal drip. She has a intermittent nonproductive cough due to throat clearing. She denies facial pain, fever, chills, or sore throat.  She has chronic anxiety and request a refill on Ativan.  History [name] has a past medical history of Hypertension; CAD (coronary artery disease); Venous insufficiency; Hyperlipidemia; Hiatal hernia; GERD (gastroesophageal reflux disease); Diverticulosis; Adenomatous colon polyp; Renal cysts, acquired, bilateral; Osteoarthritis; Chronic lower back pain; Headache(784.0); Anxiety; Allergy; Anemia; Cataract; Osteoporosis; and IBS (irritable bowel syndrome) (10/13/2011).   She has past surgical history that includes Abdominal hysterectomy; Varicose vein surgery; Bladder repair; Colonoscopy (08/17/2008); Cataract extraction; Upper gastrointestinal endoscopy (07/03/2010); Colonoscopy w/ biopsies; LEFT LOWER EXTREMITY VENOUS DOPPLER (06/18/2011); Cardiac catheterization (01/12/2002); Cardiovascular stress test (03/22/2007); and transthoracic echocardiogram (11/18/2012).   Her family history includes Heart disease in her father; Stroke in her sister. There is no history of Colon cancer.She reports that she has never smoked. She has never used smokeless tobacco. She reports that she does not drink alcohol or use illicit drugs.  Outpatient Prescriptions Prior to Visit  Medication  Sig Dispense Refill  . acetaminophen (TYLENOL) 500 MG tablet Take 500 mg by mouth every 4 (four) hours as needed for mild pain or headache.    . albuterol (PROVENTIL HFA;VENTOLIN HFA) 108 (90 BASE) MCG/ACT inhaler Inhale 2 puffs into the lungs every 6 (six) hours as needed for wheezing or shortness of breath. 1 Inhaler 1  . amLODipine (NORVASC) 10 MG tablet Take 1 tablet (10 mg total) by mouth daily. 90 tablet 3  . aspirin EC 81 MG tablet Take 81 mg by mouth daily.    . calcium carbonate (OS-CAL) 600 MG TABS tablet Take 600 mg by mouth 2 (two) times daily with a meal.    . cholecalciferol (VITAMIN D) 1000 UNITS tablet Take 1,000 Units by mouth daily.    . lansoprazole (PREVACID) 15 MG capsule Take 2 capsules every AM 30 minutes before breakfast. 180 capsule 1  . losartan (COZAAR) 50 MG tablet Take 1 tablet (50 mg total) by mouth daily. 90 tablet 3  . metoprolol succinate (TOPROL XL) 25 MG 24 hr tablet Take 1 tablet (25 mg total) by mouth daily. 90 tablet 3  . Probiotic Product (ALIGN) 4 MG CAPS Take 1 capsule by mouth daily.    . simvastatin (ZOCOR) 40 MG tablet Take 40 mg by mouth daily.    . benzonatate (TESSALON) 100 MG capsule Take 2 capsules (200 mg total) by mouth 2 (two) times daily as needed. 30 capsule 1  . doxycycline (VIBRA-TABS) 100 MG tablet Take 1 tablet (100 mg total) by mouth 2 (two) times daily. (Patient taking differently: Take 100 mg by mouth 2 (two) times daily. For 10 days) 20 tablet 0  . fluticasone (FLONASE) 50 MCG/ACT nasal spray Place 2 sprays into both nostrils daily as needed. (Patient taking differently: Place 2 sprays into both nostrils daily as  needed for allergies. ) 16 g 11  . HYDROcodone-homatropine (HYCODAN) 5-1.5 MG/5ML syrup 1 tsp at night or every 4-6 hours if needed for cough 180 mL 0  . LORazepam (ATIVAN) 1 MG tablet Take 0.5 tablets (0.5 mg total) by mouth at bedtime as needed for anxiety. 14 tablet 0  . methylPREDNISolone (MEDROL DOSEPAK) 4 MG TBPK tablet  Take as directed 21 tablet 0  . predniSONE (DELTASONE) 10 MG tablet Take 4 tablets (40 mg total) by mouth daily. Start taking on 09/15/14 12 tablet 0  . promethazine (PHENERGAN) 25 MG tablet 1/2-1 tab po every 4 hours as needed 12 tablet 0  . 0.9 %  sodium chloride infusion      No facility-administered medications prior to visit.    ROS Review of Systems  Constitutional: Negative.  Negative for fever, activity change, appetite change and fatigue.  HENT: Positive for congestion, postnasal drip, rhinorrhea and sneezing. Negative for ear pain, facial swelling, nosebleeds, sinus pressure, sore throat, tinnitus, trouble swallowing and voice change.   Eyes: Negative.  Negative for visual disturbance.  Respiratory: Negative.  Negative for cough, choking, chest tightness, shortness of breath and stridor.   Cardiovascular: Negative.  Negative for chest pain, palpitations and leg swelling.  Gastrointestinal: Negative.  Negative for nausea, vomiting, abdominal pain, diarrhea and constipation.  Endocrine: Negative.   Genitourinary: Negative.  Negative for dysuria, hematuria and difficulty urinating.  Musculoskeletal: Negative.  Negative for back pain and neck pain.  Skin: Negative.   Allergic/Immunologic: Negative.   Neurological: Negative.  Negative for dizziness, tremors, weakness, light-headedness, numbness and headaches.  Hematological: Negative.  Negative for adenopathy. Does not bruise/bleed easily.  Psychiatric/Behavioral: Negative.     Objective:  BP 142/78 mmHg  Pulse 69  Temp(Src) 97.6 F (36.4 C) (Oral)  Resp 16  Ht 5\' 4"  (1.626 m)  Wt 162 lb (73.483 kg)  BMI 27.79 kg/m2  SpO2 96%  Physical Exam  Constitutional: She is oriented to person, place, and time. She appears well-developed and well-nourished. No distress.  HENT:  Head: Normocephalic and atraumatic.  Nose: Mucosal edema present. No rhinorrhea, sinus tenderness or nasal deformity. No epistaxis. Right sinus exhibits no  maxillary sinus tenderness and no frontal sinus tenderness. Left sinus exhibits no maxillary sinus tenderness and no frontal sinus tenderness.  Mouth/Throat: Oropharynx is clear and moist. No oropharyngeal exudate.  Eyes: Conjunctivae are normal. Right eye exhibits no discharge. Left eye exhibits no discharge. No scleral icterus.  Neck: Normal range of motion. Neck supple. No JVD present. No tracheal deviation present. No thyromegaly present.  Cardiovascular: Normal rate, regular rhythm, normal heart sounds and intact distal pulses.  Exam reveals no gallop and no friction rub.   No murmur heard. Pulmonary/Chest: Effort normal and breath sounds normal. No stridor. No respiratory distress. She has no wheezes. She has no rales. She exhibits no tenderness.  Abdominal: Soft. Bowel sounds are normal. She exhibits no distension and no mass. There is no tenderness. There is no rebound and no guarding.  Musculoskeletal: Normal range of motion. She exhibits no edema or tenderness.  Lymphadenopathy:    She has no cervical adenopathy.  Neurological: She is oriented to person, place, and time.  Skin: Skin is warm and dry. No rash noted. She is not diaphoretic. No erythema. No pallor.  Vitals reviewed.   Lab Results  Component Value Date   WBC 4.8 09/14/2014   HGB 14.8 09/14/2014   HCT 45.9 09/14/2014   PLT 210 09/14/2014   GLUCOSE  99 05/23/2015   CHOL 151 02/20/2015   TRIG 139 02/20/2015   HDL 48 02/20/2015   LDLDIRECT 160.4 12/19/2008   LDLCALC 75 02/20/2015   ALT 18 02/20/2015   AST 22 02/20/2015   NA 138 05/23/2015   K 4.5 05/23/2015   CL 103 05/23/2015   CREATININE 0.90 05/23/2015   BUN 19 05/23/2015   CO2 27 05/23/2015   TSH 1.56 05/23/2015   HGBA1C 5.9 05/23/2015     Assessment & Plan:   Barbetta was seen today for hypertension.  Diagnoses and all orders for this visit:  Essential hypertension- her blood pressures well controlled, electrolytes and renal function are stable. -      Basic metabolic panel; Future -     TSH; Future  Hyperglycemia- she has prediabetes, no medications are needed at this time, she will work on her lifestyle modifications. -     Basic metabolic panel; Future -     Hemoglobin A1c; Future  GAD (generalized anxiety disorder)- she is doing well on the occasional dose of Ativan -     LORazepam (ATIVAN) 1 MG tablet; Take 0.5 tablets (0.5 mg total) by mouth at bedtime as needed for anxiety.  Hyperlipidemia with target LDL less than 70- she has achieved her LDL goal -     TSH; Future  Allergic rhinitis due to pollen- I've asked her to restart Flonase -     fluticasone (FLONASE) 50 MCG/ACT nasal spray; Place 2 sprays into both nostrils daily as needed.  I have discontinued Ms. Mikkelsen's promethazine, HYDROcodone-homatropine, methylPREDNISolone, doxycycline, benzonatate, and predniSONE. I am also having her maintain her ALIGN, aspirin EC, amLODipine, metoprolol succinate, losartan, albuterol, simvastatin, cholecalciferol, calcium carbonate, acetaminophen, lansoprazole, LORazepam, and fluticasone. We will stop administering sodium chloride.  Meds ordered this encounter  Medications  . LORazepam (ATIVAN) 1 MG tablet    Sig: Take 0.5 tablets (0.5 mg total) by mouth at bedtime as needed for anxiety.    Dispense:  30 tablet    Refill:  3  . fluticasone (FLONASE) 50 MCG/ACT nasal spray    Sig: Place 2 sprays into both nostrils daily as needed.    Dispense:  16 g    Refill:  11     Follow-up: Return in about 6 months (around 11/20/2015).  Scarlette Calico, MD

## 2015-05-24 LAB — TSH: TSH: 1.56 u[IU]/mL (ref 0.35–4.50)

## 2015-05-27 ENCOUNTER — Encounter: Payer: Self-pay | Admitting: Internal Medicine

## 2015-06-12 ENCOUNTER — Telehealth: Payer: Self-pay | Admitting: Internal Medicine

## 2015-06-12 MED ORDER — METRONIDAZOLE 250 MG PO TABS: 250.0000 mg | ORAL_TABLET | Freq: Four times a day (QID) | ORAL | Status: DC

## 2015-06-12 NOTE — Telephone Encounter (Signed)
Pt has a hx of IBS and for the past 2 weeks she has had diarrhea and bloating.  She has been taking imodium AD 2 pills daily with little relief.  The diarrhea is off/on for years.  She states she will have gassy diarrhea 4-5 times daily when she is having the episodes.  She is taking Align 1 pill daily, she was prescribed bentyl and states she takes it as needed and it will help the pain but not the diarrhea or bloating.  No antibiotic therapy recently.  Has an appt on 07/03/15 with Dr Carlean Purl.  She wants to know what can she do in the meantime.  Please advise.

## 2015-06-12 NOTE — Telephone Encounter (Signed)
Pt has been notified that the prescription has been sent and she will call back in 10 days to report on her symptoms

## 2015-06-12 NOTE — Telephone Encounter (Signed)
Try metronidazole 250 mg qid x 10 d please - if no relief call back  Will take at least a few days if not the 10 d to see result

## 2015-07-03 ENCOUNTER — Ambulatory Visit (INDEPENDENT_AMBULATORY_CARE_PROVIDER_SITE_OTHER): Payer: PPO | Admitting: Internal Medicine

## 2015-07-03 ENCOUNTER — Encounter: Payer: Self-pay | Admitting: Internal Medicine

## 2015-07-03 VITALS — BP 134/78 | HR 80 | Ht 64.5 in | Wt 162.2 lb

## 2015-07-03 DIAGNOSIS — K589 Irritable bowel syndrome without diarrhea: Secondary | ICD-10-CM

## 2015-07-03 DIAGNOSIS — K219 Gastro-esophageal reflux disease without esophagitis: Secondary | ICD-10-CM | POA: Diagnosis not present

## 2015-07-03 MED ORDER — PANTOPRAZOLE SODIUM 40 MG PO TBEC: 40.0000 mg | DELAYED_RELEASE_TABLET | Freq: Every day | ORAL | Status: DC

## 2015-07-03 MED ORDER — METRONIDAZOLE 250 MG PO TABS: 250.0000 mg | ORAL_TABLET | Freq: Four times a day (QID) | ORAL | Status: DC

## 2015-07-03 NOTE — Progress Notes (Signed)
   Subjective:    Patient ID: Krystal [name], female    DOB: 02/26/1930, 82 y.o.   MRN: [id number] Chief complaint: Follow-up of diarrhea and IBS, also GERD HPI Krystal [name] is here with her husband. She called in a month or so ago asking for refill of metronidazole which helped diarrhea in the past and I called that in and her diarrhea and bloating and gas are nearly resolved and she is pleased. She would like to switch from lansoprazole to pantoprazole for PPI she doesn't think the lansoprazole is doing the job anymore. Medications, allergies, past medical history, past surgical history, family history and social history are reviewed and updated in the EMR.   Review of Systems As above    Objective:   Physical Exam BP 134/78 mmHg  Pulse 80  Ht 5' 4.5" (1.638 m)  Wt 162 lb 4 oz (73.596 kg)  BMI 27.43 kg/m2     Assessment & Plan:   1. IBS (irritable bowel syndrome)   2. Gastroesophageal reflux disease, esophagitis presence not specified    I will give her a prescription for metronidazole use again if needed. She will keep that on hand and let me know if she does. I have changed her PPI from lansoprazole to Protonix generic. She will see me as needed.  15 minutes time spent with patient > half in counseling coordination of care

## 2015-07-03 NOTE — Patient Instructions (Signed)
  We have sent the following medications to your pharmacy for you to pick up at your convenience: Pantoprazole , Flagyl   We have sent the following prescriptions to your mail in pharmacy: Pantoprazole  If you have not heard from your mail in pharmacy within 1 week or if you have not received your medication in the mail, please contact us at [phone] so we may find out why.   Follow up with Dr Carlean Purl as needed.    I appreciate the opportunity to care for you.

## 2015-07-10 DIAGNOSIS — M545 Low back pain: Secondary | ICD-10-CM | POA: Diagnosis not present

## 2015-07-10 DIAGNOSIS — N3020 Other chronic cystitis without hematuria: Secondary | ICD-10-CM | POA: Diagnosis not present

## 2015-07-11 ENCOUNTER — Telehealth: Payer: Self-pay | Admitting: Internal Medicine

## 2015-07-11 DIAGNOSIS — M545 Low back pain, unspecified: Secondary | ICD-10-CM

## 2015-07-11 DIAGNOSIS — M159 Polyosteoarthritis, unspecified: Secondary | ICD-10-CM

## 2015-07-11 DIAGNOSIS — M150 Primary generalized (osteo)arthritis: Secondary | ICD-10-CM

## 2015-07-11 DIAGNOSIS — G8929 Other chronic pain: Secondary | ICD-10-CM

## 2015-07-11 MED ORDER — MELOXICAM 7.5 MG PO TABS: 7.5000 mg | ORAL_TABLET | Freq: Every day | ORAL | Status: DC

## 2015-07-11 NOTE — Telephone Encounter (Signed)
Left to inform pt.

## 2015-07-11 NOTE — Telephone Encounter (Signed)
Pt was wondering if Dr. Ronnald Ramp will be able to write her new rx for Meloxicam 7.5 mg to be send to [facility] 30 days supply or mail order for 90 days supply. Please help, she said that Dr. Lenna Gilford give her this med for arthritis. Please call pt

## 2015-07-11 NOTE — Telephone Encounter (Signed)
Done- I used the pharmacies listed

## 2015-07-26 ENCOUNTER — Ambulatory Visit (INDEPENDENT_AMBULATORY_CARE_PROVIDER_SITE_OTHER): Payer: PPO

## 2015-07-26 VITALS — BP 160/80 | Ht 65.0 in | Wt 162.2 lb

## 2015-07-26 DIAGNOSIS — Z0000 Encounter for general adult medical examination without abnormal findings: Secondary | ICD-10-CM

## 2015-07-26 NOTE — Patient Instructions (Addendum)
Krystal [name] , Thank you for taking time to come for your Medicare Wellness Visit. I appreciate your ongoing commitment to your health goals. Please review the following plan we discussed and let me know if I can assist you in the future.   Fup with Dr. Ronnald Ramp regarding Tetanus  These are the goals we discussed: Goals    . 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 Tetanus Vaccine  01/23/1949  . Flu Shot  10/30/2015  . DEXA scan (bone density measurement)  Completed  . Shingles Vaccine  Completed     Fall Prevention in the Home  Falls can cause injuries. They can happen to people of all ages. There are many things you can do to make your home safe and to help prevent falls.  WHAT CAN I DO ON THE OUTSIDE OF MY HOME?  Regularly fix the edges of walkways and driveways and fix any cracks.  Remove anything that might make you trip as you walk through a door, such as a raised step or threshold.  Trim any bushes or trees on the path to your home.  Use bright outdoor lighting.  Clear any walking paths of anything that might make someone trip, such as rocks or tools.  Regularly check to see if handrails are loose or broken. Make sure that both sides of any steps have handrails.  Any raised decks and porches should have guardrails on the edges.  Have any leaves, snow, or ice cleared regularly.  Use sand or salt on walking paths during winter.  Clean up any spills in your garage right away. This includes oil or grease spills. WHAT CAN I DO IN THE BATHROOM?   Use night lights.  Install grab bars by the toilet and in the tub and shower. Do not use towel bars as grab bars.  Use non-skid mats or decals in the tub or shower.  If you need to sit down in the shower, use a plastic, non-slip stool.  Keep the floor dry. Clean up any water  that spills on the floor as soon as it happens.  Remove soap buildup in the tub or shower regularly.  Attach bath mats securely with double-sided non-slip rug tape.  Do not have throw rugs and other things on the floor that can make you trip. WHAT CAN I DO IN THE BEDROOM?  Use night lights.  Make sure that you have a light by your bed that is easy to reach.  Do not use any sheets or blankets that are too big for your bed. They should not hang down onto the floor.  Have a firm chair that has side arms. You can use this for support while you get dressed.  Do not have throw rugs and other things on the floor that can make you trip. WHAT CAN I DO IN THE KITCHEN?  Clean up any spills right away.  Avoid walking on wet floors.  Keep items that you use a lot in easy-to-reach places.  If you need to reach something above you, use a strong step stool that has a grab bar.  Keep electrical cords out of the way.  Do not use floor polish or wax that makes floors slippery. If you must use wax, use non-skid floor wax.  Do  not have throw rugs and other things on the floor that can make you trip. WHAT CAN I DO WITH MY STAIRS?  Do not leave any items on the stairs.  Make sure that there are handrails on both sides of the stairs and use them. Fix handrails that are broken or loose. Make sure that handrails are as long as the stairways.  Check any carpeting to make sure that it is firmly attached to the stairs. Fix any carpet that is loose or worn.  Avoid having throw rugs at the top or bottom of the stairs. If you do have throw rugs, attach them to the floor with carpet tape.  Make sure that you have a light switch at the top of the stairs and the bottom of the stairs. If you do not have them, ask someone to add them for you. WHAT ELSE CAN I DO TO HELP PREVENT FALLS?  Wear shoes that:  Do not have high heels.  Have rubber bottoms.  Are comfortable and fit you well.  Are closed at  the toe. Do not wear sandals.  If you use a stepladder:  Make sure that it is fully opened. Do not climb a closed stepladder.  Make sure that both sides of the stepladder are locked into place.  Ask someone to hold it for you, if possible.  Clearly mark and make sure that you can see:  Any grab bars or handrails.  First and last steps.  Where the edge of each step is.  Use tools that help you move around (mobility aids) if they are needed. These include:  Canes.  Walkers.  Scooters.  Crutches.  Turn on the lights when you go into a dark area. Replace any light bulbs as soon as they burn out.  Set up your furniture so you have a clear path. Avoid moving your furniture around.  If any of your floors are uneven, fix them.  If there are any pets around you, be aware of where they are.  Review your medicines with your doctor. Some medicines can make you feel dizzy. This can increase your chance of falling. Ask your doctor what other things that you can do to help prevent falls.   This information is not intended to replace advice given to you by your health care provider. Make sure you discuss any questions you have with your health care provider.   Document Released: 01/11/2009 Document Revised: 08/01/2014 Document Reviewed: 04/21/2014 Elsevier Interactive Patient Education 2016 [place] Maintenance, Female Adopting a healthy lifestyle and getting preventive care can go a long way to promote health and wellness. Talk with your health care provider about what schedule of regular examinations is right for you. This is a good chance for you to check in with your provider about disease prevention and staying healthy. In between checkups, there are plenty of things you can do on your own. Experts have done a lot of research about which lifestyle changes and preventive measures are most likely to keep you healthy. Ask your health care provider for more  information. WEIGHT AND DIET  Eat a healthy diet  Be sure to include plenty of vegetables, fruits, low-fat dairy products, and lean protein.  Do not eat a lot of foods high in solid fats, added sugars, or salt.  Get regular exercise. This is one of the most important things you can do for your health.  Most adults should exercise for at least 150 minutes each  week. The exercise should increase your heart rate and make you sweat (moderate-intensity exercise).  Most adults should also do strengthening exercises at least twice a week. This is in addition to the moderate-intensity exercise.  Maintain a healthy weight  Body mass index (BMI) is a measurement that can be used to identify possible weight problems. It estimates body fat based on height and weight. Your health care provider can help determine your BMI and help you achieve or maintain a healthy weight.  For females 18 years of age and older:   A BMI below 18.5 is considered underweight.  A BMI of 18.5 to 24.9 is normal.  A BMI of 25 to 29.9 is considered overweight.  A BMI of 30 and above is considered obese.  Watch levels of cholesterol and blood lipids  You should start having your blood tested for lipids and cholesterol at 80 years of age, then have this test every 5 years.  You may need to have your cholesterol levels checked more often if:  Your lipid or cholesterol levels are high.  You are older than 80 years of age.  You are at high risk for heart disease.  CANCER SCREENING   Lung Cancer  Lung cancer screening is recommended for adults 91-36 years old who are at high risk for lung cancer because of a history of smoking.  A yearly low-dose CT scan of the lungs is recommended for people who:  Currently smoke.  Have quit within the past 15 years.  Have at least a 30-pack-year history of smoking. A pack year is smoking an average of one pack of cigarettes a day for 1 year.  Yearly screening should  continue until it has been 15 years since you quit.  Yearly screening should stop if you develop a health problem that would prevent you from having lung cancer treatment.  Breast Cancer  Practice breast self-awareness. This means understanding how your breasts normally appear and feel.  It also means doing regular breast self-exams. Let your health care provider know about any changes, no matter how small.  If you are in your 20s or 30s, you should have a clinical breast exam (CBE) by a health care provider every 1-3 years as part of a regular health exam.  If you are 76 or older, have a CBE every year. Also consider having a breast X-ray (mammogram) every year.  If you have a family history of breast cancer, talk to your health care provider about genetic screening.  If you are at high risk for breast cancer, talk to your health care provider about having an MRI and a mammogram every year.  Breast cancer gene (BRCA) assessment is recommended for women who have family members with BRCA-related cancers. BRCA-related cancers include:  Breast.  Ovarian.  Tubal.  Peritoneal cancers.  Results of the assessment will determine the need for genetic counseling and BRCA1 and BRCA2 testing. Cervical Cancer Your health care provider may recommend that you be screened regularly for cancer of the pelvic organs (ovaries, uterus, and vagina). This screening involves a pelvic examination, including checking for microscopic changes to the surface of your cervix (Pap test). You may be encouraged to have this screening done every 3 years, beginning at age 8.  For women ages 49-65, health care providers may recommend pelvic exams and Pap testing every 3 years, or they may recommend the Pap and pelvic exam, combined with testing for human papilloma virus (HPV), every 5 years. Some types of  HPV increase your risk of cervical cancer. Testing for HPV may also be done on women of any age with unclear Pap  test results.  Other health care providers may not recommend any screening for nonpregnant women who are considered low risk for pelvic cancer and who do not have symptoms. Ask your health care provider if a screening pelvic exam is right for you.  If you have had past treatment for cervical cancer or a condition that could lead to cancer, you need Pap tests and screening for cancer for at least 20 years after your treatment. If Pap tests have been discontinued, your risk factors (such as having a new sexual partner) need to be reassessed to determine if screening should resume. Some women have medical problems that increase the chance of getting cervical cancer. In these cases, your health care provider may recommend more frequent screening and Pap tests. Colorectal Cancer  This type of cancer can be detected and often prevented.  Routine colorectal cancer screening usually begins at 80 years of age and continues through 80 years of age.  Your health care provider may recommend screening at an earlier age if you have risk factors for colon cancer.  Your health care provider may also recommend using home test kits to check for hidden blood in the stool.  A small camera at the end of a tube can be used to examine your colon directly (sigmoidoscopy or colonoscopy). This is done to check for the earliest forms of colorectal cancer.  Routine screening usually begins at age 92.  Direct examination of the colon should be repeated every 5-10 years through 80 years of age. However, you may need to be screened more often if early forms of precancerous polyps or small growths are found. Skin Cancer  Check your skin from head to toe regularly.  Tell your health care provider about any new moles or changes in moles, especially if there is a change in a mole's shape or color.  Also tell your health care provider if you have a mole that is larger than the size of a pencil eraser.  Always use sunscreen.  Apply sunscreen liberally and repeatedly throughout the day.  Protect yourself by wearing long sleeves, pants, a wide-brimmed hat, and sunglasses whenever you are outside. HEART DISEASE, DIABETES, AND HIGH BLOOD PRESSURE   High blood pressure causes heart disease and increases the risk of stroke. High blood pressure is more likely to develop in:  People who have blood pressure in the high end of the normal range (130-139/85-89 mm Hg).  People who are overweight or obese.  People who are African American.  If you are 21-30 years of age, have your blood pressure checked every 3-5 years. If you are 55 years of age or older, have your blood pressure checked every year. You should have your blood pressure measured twice--once when you are at a hospital or clinic, and once when you are not at a hospital or clinic. Record the average of the two measurements. To check your blood pressure when you are not at a hospital or clinic, you can use:  An automated blood pressure machine at a pharmacy.  A home blood pressure monitor.  If you are between 15 years and 15 years old, ask your health care provider if you should take aspirin to prevent strokes.  Have regular diabetes screenings. This involves taking a blood sample to check your fasting blood sugar level.  If you are at a normal  weight and have a low risk for diabetes, have this test once every three years after 80 years of age.  If you are overweight and have a high risk for diabetes, consider being tested at a younger age or more often. PREVENTING INFECTION  Hepatitis B  If you have a higher risk for hepatitis B, you should be screened for this virus. You are considered at high risk for hepatitis B if:  You were born in a country where hepatitis B is common. Ask your health care provider which countries are considered high risk.  Your parents were born in a high-risk country, and you have not been immunized against hepatitis B (hepatitis B  vaccine).  You have HIV or AIDS.  You use needles to inject street drugs.  You live with someone who has hepatitis B.  You have had sex with someone who has hepatitis B.  You get hemodialysis treatment.  You take certain medicines for conditions, including cancer, organ transplantation, and autoimmune conditions. Hepatitis C  Blood testing is recommended for:  Everyone born from [birth year] through 1963.  Anyone with known risk factors for hepatitis C. Sexually transmitted infections (STIs)  You should be screened for sexually transmitted infections (STIs) including gonorrhea and chlamydia if:  You are sexually active and are younger than 80 years of age.  You are older than 80 years of age and your health care provider tells you that you are at risk for this type of infection.  Your sexual activity has changed since you were last screened and you are at an increased risk for chlamydia or gonorrhea. Ask your health care provider if you are at risk.  If you do not have HIV, but are at risk, it may be recommended that you take a prescription medicine daily to prevent HIV infection. This is called pre-exposure prophylaxis (PrEP). You are considered at risk if:  You are sexually active and do not regularly use condoms or know the HIV status of your partner(s).  You take drugs by injection.  You are sexually active with a partner who has HIV. Talk with your health care provider about whether you are at high risk of being infected with HIV. If you choose to begin PrEP, you should first be tested for HIV. You should then be tested every 3 months for as long as you are taking PrEP.  PREGNANCY   If you are premenopausal and you may become pregnant, ask your health care provider about preconception counseling.  If you may become pregnant, take 400 to 800 micrograms (mcg) of folic acid every day.  If you want to prevent pregnancy, talk to your health care provider about birth control  (contraception). OSTEOPOROSIS AND MENOPAUSE   Osteoporosis is a disease in which the bones lose minerals and strength with aging. This can result in serious bone fractures. Your risk for osteoporosis can be identified using a bone density scan.  If you are 76 years of age or older, or if you are at risk for osteoporosis and fractures, ask your health care provider if you should be screened.  Ask your health care provider whether you should take a calcium or vitamin D supplement to lower your risk for osteoporosis.  Menopause may have certain physical symptoms and risks.  Hormone replacement therapy may reduce some of these symptoms and risks. Talk to your health care provider about whether hormone replacement therapy is right for you.  HOME CARE INSTRUCTIONS   Schedule regular health, dental, and  eye exams.  Stay current with your immunizations.   Do not use any tobacco products including cigarettes, chewing tobacco, or electronic cigarettes.  If you are pregnant, do not drink alcohol.  If you are breastfeeding, limit how much and how often you drink alcohol.  Limit alcohol intake to no more than 1 drink per day for nonpregnant women. One drink equals 12 ounces of beer, 5 ounces of wine, or 1 ounces of hard liquor.  Do not use street drugs.  Do not share needles.  Ask your health care provider for help if you need support or information about quitting drugs.  Tell your health care provider if you often feel depressed.  Tell your health care provider if you have ever been abused or do not feel safe at home.   This information is not intended to replace advice given to you by your health care provider. Make sure you discuss any questions you have with your health care provider.   Document Released: 09/30/2010 Document Revised: 04/07/2014 Document Reviewed: 02/16/2013 Elsevier Interactive Patient Education [employer].

## 2015-07-26 NOTE — Progress Notes (Addendum)
Subjective:   Krystal [name] is a 82 y.o. female who presents for Medicare Annual (Subsequent) preventive examination.  Review of Systems:   HRA assessment completed during visit; [place]  The Patient was informed that this wellness visit is to identify risk and educate on how to reduce risk for increase disease through lifestyle changes.   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  ETOH: rarely    Medication review/ Is not taking Protonix 40mg ; will call GI and let them know; Reflux is stable currently   BMI: 27 Diet; eating hot house tomatoes;  Breakfast; cereal and banana; fruit or egg Lunch; sandwich or salad Dinner; eats at 4pm; meat and vegetable   Exercise; walks 4 times a week x 45 min Walk with senior citizens; walks at a gym at [employer]; sometimes walks at [employer] around where she lives   HOME SAFETY; spouse at home and works x 2 days a week  Home? One level  Age in place? YES  Removal of clutter clearing paths through the home, shower is separate; very accessible  Lives in townhome at [employer]; Yes; good neighbors  Smoke detectors yes  Firearms safety reviewed and will keep in a safe place if these exist. Driving accidents; no; Wears a seatbelt yes Advised to use sun protection YES  Stressors most days 2-3; most days. How can it be a one? Start  sewing helps   Depression: Denies feeling depressed or hopeless; voices pleasure in daily life Mood stable;   Cognitive; Presents with no issues;  Engaged in assessment Manages checkbook, medications; no failures of task Ad8 score reviewed for issues; . Issues making decisions; NO  . Less interest in hobbies / activities: no  . Repeats questions, stories; family complaining: no  . Trouble using ordinary gadgets; microwave; computer; no  . Forgets the month or year: no  . Missing apt; no . Daily problems with thinking of memory:NO Fall assessment No Gait assessment appears normal;   Mobilization and Functional losses from last year to this year? No Does all the grocery shopping, cooking and cleaning  Sleep pattern changes; didn't sleep last pm and BP up but does check  Tries to take a nap during the day;  This Am BP 129/80 Hours of sleep on average? About 4 to 5  Has taken biofeedback; does meditate at hs which helps. Did not take her Lorazapam .5 but will start taking tonight until sleep re-established; will call for apt if she needs further assistance   Urinary or fecal incontinence reviewed/ not now   Meds reviewed and could not tolerate protonix at 40 mg/ Will call GI and let them know.    Counseling Health Maintenance Colonoscopy; 07/2008: aged out EKG: 01/2015  Mammogram 08/2013; has every year;  OB-GYN; Laurin Coder Dexa 12 2016 Femur Total Left is 0.642 g/cm2 with a T-score of -2.9. This patient is considered osteoporotic according to [facility] [place]) criteria. There has been a statistically significant increase in BMD of Lumbar spine and Left hip since prior exam dated 03/04/2013.  PAP: stopped / TAH; bladder tach  Hearing: 4000hz  in right; faint in left   Ophthalmology exam; last June; has another in June the 12; [facility]ark [facility]  Immunizations Due: Tetanus and took a long time ago. Had reaction to Pneumonia when seeing Dr. Lenna Gilford  Will  discuss with Dr. Ronnald Ramp this year in August  Advanced Directive; YES; completed   Health Recommendations discussed regarding diet; calcium and vit d; wt bearing exercise; Vaccinations; stress reduction   Current Care Team reviewed and updated   Cardiac Risk Factors include: advanced age (>69men, >74 women);dyslipidemia;family history of premature cardiovascular disease;hypertension     Objective:     Vitals: BP 160/80 mmHg  Ht 5\' 5"  (1.651 m)  Wt 162 lb 4 oz (73.596 kg)  BMI 27.00 kg/m2  Body mass index is 27 kg/(m^2).   Tobacco History  Smoking status  . Never Smoker   Smokeless tobacco  . Never Used     Counseling given: Yes   Past Medical History  Diagnosis Date  . Hypertension   . CAD (coronary artery disease)   . Venous insufficiency   . Hyperlipidemia   . Hiatal hernia   . GERD (gastroesophageal reflux disease)   . Diverticulosis   . Adenomatous colon polyp   . Renal cysts, acquired, bilateral   . Osteoarthritis   . Chronic lower back pain   . Headache(784.0)   . Anxiety   . Allergy     seasonal  . Anemia     pt. denies  . Cataract     cataracts removed left eye.  . Osteoporosis   . IBS (irritable bowel syndrome) 10/13/2011   Past Surgical History  Procedure Laterality Date  . Abdominal hysterectomy    . Varicose vein surgery    . Bladder repair    . Colonoscopy  08/17/2008    adenomatous polyp, diverticulosis, external hemorrhoids  . Cataract extraction      bilateral  . Upper gastrointestinal endoscopy  07/03/2010    hiatal hernia  . Colonoscopy w/ biopsies      multiple   . Left lower extremity venous doppler  06/18/2011    No evidence of left lower extremity DVT  . Cardiac catheterization  01/12/2002    90% stenosis of the left circumflex, stented with a 3x49mm Cypher stent, resulting in reduction of 90% to 10%   . Cardiovascular stress test  03/22/2007    Mild inferolateral thinning toward the apex without significant ischemia.  Nondiagnostic electrocardiogram.  . Transthoracic echocardiogram  11/18/2012    EF [id number], LV systolic mild-moderately reduced, mild-moderate mitral valve regurg, mild-moderate tricuspid valve regurg. NSR-LBBB with occas PVCs   Family History  Problem Relation Age of Onset  . Heart disease Father   . Colon cancer Neg Hx   . Stroke Sister    History  Sexual Activity  . Sexual Activity: Not on file    Outpatient Encounter Prescriptions as of 07/26/2015  Medication Sig  . acetaminophen (TYLENOL) 500 MG tablet Take 500 mg by mouth every 4 (four) hours as needed for mild pain or headache.  . albuterol (PROVENTIL HFA;VENTOLIN HFA) 108 (90 BASE) MCG/ACT inhaler Inhale 2 puffs into the lungs every  6 (six) hours as needed for wheezing or shortness of breath.  Marland Kitchen amLODipine (NORVASC) 10 MG tablet Take 1 tablet (10 mg total) by mouth daily.  Marland Kitchen aspirin EC 81 MG tablet Take 81 mg by mouth daily.  . fluticasone (FLONASE) 50 MCG/ACT nasal spray Place 2 sprays into both nostrils daily as needed.  Marland Kitchen LORazepam (ATIVAN) 1 MG tablet Take 0.5 tablets (0.5 mg total) by mouth at bedtime as needed for anxiety.  Marland Kitchen losartan (COZAAR) 50 MG tablet Take 1 tablet (50 mg total) by mouth daily.  . meloxicam (MOBIC) 7.5 MG tablet Take 1 tablet (7.5 mg total) by mouth daily.  . metoprolol succinate (TOPROL XL) 25 MG 24 hr tablet Take 1 tablet (25 mg total) by mouth daily.  . Probiotic Product (ALIGN) 4 MG CAPS Take 1 capsule by mouth daily.  . simvastatin (ZOCOR) 40 MG tablet Take 40 mg by mouth daily.  . calcium carbonate (OS-CAL) 600 MG TABS tablet Take 600 mg by mouth 2 (two) times daily with a meal.  . cholecalciferol (VITAMIN D) 1000 UNITS tablet Take 1,000 Units by mouth daily.  . pantoprazole (PROTONIX) 40 MG tablet Take 1 tablet (40 mg total) by mouth daily before breakfast. (Patient not taking: Reported on 07/26/2015)   No facility-administered encounter medications on file as of 07/26/2015.    Activities of  Daily Living In your present state of health, do you have any difficulty performing the following activities: 07/26/2015  Hearing? N  Vision? N  Difficulty concentrating or making decisions? N  Walking or climbing stairs? N  Dressing or bathing? N  Doing errands, shopping? N  Preparing Food and eating ? N  Using the Toilet? N  In the past six months, have you accidently leaked urine? N  Do you have problems with loss of bowel control? N  Managing your Medications? N  Managing your Finances? N  Housekeeping or managing your Housekeeping? N    Patient Care Team: Janith Lima, MD as PCP - General (Internal Medicine)    Assessment:     Exercise Activities and Dietary recommendations Current Exercise Habits: Home exercise routine, Type of exercise: walking, Time (Minutes): 45, Frequency (Times/Week): 4, Weekly Exercise (Minutes/Week): 180, Intensity: Moderate  Goals    . patient     Patient will start sewing more; makes bed-skirts and pillows;       Fall Risk Fall Risk  07/26/2015 05/09/2014 09/30/2012  Falls in the past year? No No No   Depression Screen PHQ 2/9 Scores 07/26/2015 09/12/2014 05/09/2014 09/30/2012  PHQ - 2 Score 0 0 0 0     Cognitive Testing No flowsheet data found. AD8 score 0 / affect engaging and appropriate  Immunization History  Administered Date(s) Administered  . Influenza Split 02/04/2011, 01/22/2012  . Influenza Whole 01/12/2008, 02/08/2009, 02/05/2010  . Influenza,inj,Quad PF,36+ Mos 01/06/2013, 01/20/2014  . Influenza-Unspecified 01/08/2015  . Zoster 04/16/2012   Screening Tests Health Maintenance  Topic Date Due  . TETANUS/TDAP  01/23/1949  . INFLUENZA VACCINE  10/30/2015  . DEXA SCAN  Completed  . ZOSTAVAX  Completed      Plan:   To fup with Dr. Ronnald Ramp regarding TD at next visit Will let GI know she cannot tolerate protonix but no c/o at present  During the course of the visit the patient was educated and counseled about the following  appropriate screening and preventive services:   Vaccines to include Pneumoccal, Influenza, Hepatitis B, Td, Zostavax, HCV  Electrocardiogram 01/2015  Cardiovascular Disease/ hx of no issues at present; remains on statin  Colorectal cancer screening; aged out  Bone density screening- states GYN follows; taking CA 1200; Vit D 2000 and weight bearing exercise; on prolia  Diabetes screening; discussed A1c elevation but FBS within norm at 99  Glaucoma screening;  Annual; Dr. Satira Sark; in June every year  Spoke to Dr. Mellissa Kohut office; Last eye exam confirmed 07/2014 and next apt this June 2017; had narrow angle glaucoma in 2013 but has laser surgery and no further issues / will u/d health maintenance  Mammography/PAP- annual GYN  Nutrition counseling   Patient Instructions (the written plan) was given to the patient.   Wynetta Fines, RN  07/26/2015     Medical screening examination/treatment/procedure(s) were performed by non-physician practitioner and as supervising physician I was immediately available for consultation/collaboration. I agree with above. Scarlette Calico, MD

## 2015-09-04 ENCOUNTER — Encounter (HOSPITAL_COMMUNITY): Payer: Self-pay | Admitting: *Deleted

## 2015-09-04 ENCOUNTER — Observation Stay (HOSPITAL_COMMUNITY)
Admission: EM | Admit: 2015-09-04 | Discharge: 2015-09-06 | Disposition: A | Discharge status: Home / Self Care | Payer: PPO | Attending: Internal Medicine | Admitting: Internal Medicine

## 2015-09-04 DIAGNOSIS — I10 Essential (primary) hypertension: Secondary | ICD-10-CM | POA: Diagnosis present

## 2015-09-04 DIAGNOSIS — E785 Hyperlipidemia, unspecified: Secondary | ICD-10-CM | POA: Diagnosis not present

## 2015-09-04 DIAGNOSIS — K56600 Partial intestinal obstruction, unspecified as to cause: Secondary | ICD-10-CM

## 2015-09-04 DIAGNOSIS — Z791 Long term (current) use of non-steroidal anti-inflammatories (NSAID): Secondary | ICD-10-CM | POA: Diagnosis not present

## 2015-09-04 DIAGNOSIS — R1031 Right lower quadrant pain: Secondary | ICD-10-CM | POA: Diagnosis not present

## 2015-09-04 DIAGNOSIS — Z9861 Coronary angioplasty status: Secondary | ICD-10-CM

## 2015-09-04 DIAGNOSIS — Z79899 Other long term (current) drug therapy: Secondary | ICD-10-CM | POA: Insufficient documentation

## 2015-09-04 DIAGNOSIS — Z955 Presence of coronary angioplasty implant and graft: Secondary | ICD-10-CM | POA: Insufficient documentation

## 2015-09-04 DIAGNOSIS — K56609 Unspecified intestinal obstruction, unspecified as to partial versus complete obstruction: Secondary | ICD-10-CM | POA: Diagnosis present

## 2015-09-04 DIAGNOSIS — Z7982 Long term (current) use of aspirin: Secondary | ICD-10-CM | POA: Insufficient documentation

## 2015-09-04 DIAGNOSIS — M1990 Unspecified osteoarthritis, unspecified site: Secondary | ICD-10-CM | POA: Insufficient documentation

## 2015-09-04 DIAGNOSIS — R112 Nausea with vomiting, unspecified: Secondary | ICD-10-CM

## 2015-09-04 DIAGNOSIS — I2510 Atherosclerotic heart disease of native coronary artery without angina pectoris: Secondary | ICD-10-CM | POA: Diagnosis present

## 2015-09-04 DIAGNOSIS — Z7951 Long term (current) use of inhaled steroids: Secondary | ICD-10-CM | POA: Insufficient documentation

## 2015-09-04 DIAGNOSIS — K219 Gastro-esophageal reflux disease without esophagitis: Secondary | ICD-10-CM | POA: Diagnosis not present

## 2015-09-04 DIAGNOSIS — R109 Unspecified abdominal pain: Secondary | ICD-10-CM | POA: Diagnosis not present

## 2015-09-04 DIAGNOSIS — K5669 Other intestinal obstruction: Secondary | ICD-10-CM | POA: Diagnosis not present

## 2015-09-04 MED ORDER — ONDANSETRON HCL 4 MG/2ML IJ SOLN: 4.0000 mg | Freq: Once | INTRAMUSCULAR | Status: DC | PRN

## 2015-09-04 NOTE — ED Notes (Signed)
Pt c/o abd pain to upper abd that radiates to back; pt states "It is like spasms"; pt states that it began around 4pm after eating a salad pt states that she has vomited x 2 but that it has not helped any; pt states that she has had spasms in the past but "not this bad" pt is shaking in triage

## 2015-09-04 NOTE — ED Notes (Signed)
Attempted temp x 2 in triage and was unsuccessful

## 2015-09-05 ENCOUNTER — Emergency Department (HOSPITAL_COMMUNITY): Payer: PPO

## 2015-09-05 ENCOUNTER — Inpatient Hospital Stay (HOSPITAL_COMMUNITY): Payer: PPO

## 2015-09-05 ENCOUNTER — Encounter (HOSPITAL_COMMUNITY): Payer: Self-pay

## 2015-09-05 DIAGNOSIS — R109 Unspecified abdominal pain: Secondary | ICD-10-CM | POA: Diagnosis not present

## 2015-09-05 DIAGNOSIS — K56609 Unspecified intestinal obstruction, unspecified as to partial versus complete obstruction: Secondary | ICD-10-CM

## 2015-09-05 DIAGNOSIS — R1031 Right lower quadrant pain: Secondary | ICD-10-CM | POA: Diagnosis not present

## 2015-09-05 DIAGNOSIS — K5669 Other intestinal obstruction: Secondary | ICD-10-CM

## 2015-09-05 DIAGNOSIS — K5660 Unspecified intestinal obstruction: Secondary | ICD-10-CM | POA: Diagnosis not present

## 2015-09-05 DIAGNOSIS — I10 Essential (primary) hypertension: Secondary | ICD-10-CM

## 2015-09-05 HISTORY — DX: Unspecified intestinal obstruction, unspecified as to partial versus complete obstruction: K56.609

## 2015-09-05 LAB — CBC WITH DIFFERENTIAL/PLATELET
Basophils Absolute: 0 10*3/uL (ref 0.0–0.1)
Basophils Relative: 0 %
EOS ABS: 0 10*3/uL (ref 0.0–0.7)
EOS PCT: 0 %
HCT: 43.7 % (ref 36.0–46.0)
Hemoglobin: 15.1 g/dL — ABNORMAL HIGH (ref 12.0–15.0)
LYMPHS ABS: 1.5 10*3/uL (ref 0.7–4.0)
LYMPHS PCT: 14 %
MCH: 31.3 pg (ref 26.0–34.0)
MCHC: 34.6 g/dL (ref 30.0–36.0)
MCV: 90.7 fL (ref 78.0–100.0)
MONOS PCT: 5 %
Monocytes Absolute: 0.5 10*3/uL (ref 0.1–1.0)
Neutro Abs: 8.6 10*3/uL — ABNORMAL HIGH (ref 1.7–7.7)
Neutrophils Relative %: 81 %
PLATELETS: 227 10*3/uL (ref 150–400)
RBC: 4.82 MIL/uL (ref 3.87–5.11)
RDW: 13.6 % (ref 11.5–15.5)
WBC: 10.6 10*3/uL — AB (ref 4.0–10.5)

## 2015-09-05 LAB — COMPREHENSIVE METABOLIC PANEL
ALBUMIN: 4.4 g/dL (ref 3.5–5.0)
ALK PHOS: 73 U/L (ref 38–126)
ALT: 20 U/L (ref 14–54)
ALT: 21 U/L (ref 14–54)
AST: 27 U/L (ref 15–41)
AST: 28 U/L (ref 15–41)
Albumin: 4.7 g/dL (ref 3.5–5.0)
Alkaline Phosphatase: 71 U/L (ref 38–126)
Anion gap: 12 (ref 5–15)
Anion gap: 13 (ref 5–15)
BUN: 20 mg/dL (ref 6–20)
BUN: 21 mg/dL — AB (ref 6–20)
CALCIUM: 9.6 mg/dL (ref 8.9–10.3)
CHLORIDE: 102 mmol/L (ref 101–111)
CO2: 22 mmol/L (ref 22–32)
CO2: 23 mmol/L (ref 22–32)
CREATININE: 0.96 mg/dL (ref 0.44–1.00)
Calcium: 9 mg/dL (ref 8.9–10.3)
Chloride: 102 mmol/L (ref 101–111)
Creatinine, Ser: 0.94 mg/dL (ref 0.44–1.00)
GFR calc Af Amer: >60 mL/min (ref >60)
GFR calc non Af Amer: 53 mL/min — ABNORMAL LOW (ref >60)
GFR, EST NON AFRICAN AMERICAN: 55 mL/min — AB (ref >60)
GLUCOSE: 130 mg/dL — AB (ref 65–99)
Glucose, Bld: 137 mg/dL — ABNORMAL HIGH (ref 65–99)
POTASSIUM: 3.9 mmol/L (ref 3.5–5.1)
Potassium: 4.4 mmol/L (ref 3.5–5.1)
SODIUM: 136 mmol/L (ref 135–145)
Sodium: 138 mmol/L (ref 135–145)
Total Bilirubin: 0.7 mg/dL (ref 0.3–1.2)
Total Bilirubin: 1 mg/dL (ref 0.3–1.2)
Total Protein: 7.3 g/dL (ref 6.5–8.1)
Total Protein: 7.4 g/dL (ref 6.5–8.1)

## 2015-09-05 LAB — CBC
HCT: 46.3 % — ABNORMAL HIGH (ref 36.0–46.0)
Hemoglobin: 15.5 g/dL — ABNORMAL HIGH (ref 12.0–15.0)
MCH: 31.1 pg (ref 26.0–34.0)
MCHC: 33.5 g/dL (ref 30.0–36.0)
MCV: 93 fL (ref 78.0–100.0)
Platelets: 222 10*3/uL (ref 150–400)
RBC: 4.98 MIL/uL (ref 3.87–5.11)
RDW: 13.6 % (ref 11.5–15.5)
WBC: 12.9 10*3/uL — ABNORMAL HIGH (ref 4.0–10.5)

## 2015-09-05 LAB — DIFFERENTIAL
Basophils Absolute: 0 10*3/uL (ref 0.0–0.1)
Basophils Relative: 0 %
EOS ABS: 0.1 10*3/uL (ref 0.0–0.7)
EOS PCT: 1 %
LYMPHS ABS: 3.6 10*3/uL (ref 0.7–4.0)
Lymphocytes Relative: 29 %
MONOS PCT: 7 %
Monocytes Absolute: 0.9 10*3/uL (ref 0.1–1.0)
NEUTROS PCT: 63 %
Neutro Abs: 7.9 10*3/uL — ABNORMAL HIGH (ref 1.7–7.7)

## 2015-09-05 LAB — URINALYSIS, ROUTINE W REFLEX MICROSCOPIC
BILIRUBIN URINE: NEGATIVE
Glucose, UA: NEGATIVE mg/dL
Hgb urine dipstick: NEGATIVE
KETONES UR: NEGATIVE mg/dL
Leukocytes, UA: NEGATIVE
Nitrite: NEGATIVE
PH: 7 (ref 5.0–8.0)
Protein, ur: NEGATIVE mg/dL
Specific Gravity, Urine: 1.01 (ref 1.005–1.030)

## 2015-09-05 LAB — GLUCOSE, CAPILLARY: GLUCOSE-CAPILLARY: 127 mg/dL — AB (ref 65–99)

## 2015-09-05 LAB — LIPASE, BLOOD: Lipase: 26 U/L (ref 11–51)

## 2015-09-05 LAB — I-STAT TROPONIN, ED: Troponin i, poc: 0 ng/mL (ref 0.00–0.08)

## 2015-09-05 MED ORDER — DEXTROSE-NACL 5-0.9 % IV SOLN
INTRAVENOUS | Status: AC
  Administered 2015-09-05 (×2): via INTRAVENOUS

## 2015-09-05 MED ORDER — ONDANSETRON HCL 4 MG PO TABS: 4.0000 mg | ORAL_TABLET | Freq: Four times a day (QID) | ORAL | Status: DC | PRN

## 2015-09-05 MED ORDER — FENTANYL CITRATE (PF) 100 MCG/2ML IJ SOLN: 50.0000 ug | INTRAMUSCULAR | Status: DC | PRN

## 2015-09-05 MED ORDER — SODIUM CHLORIDE 0.9 % IV SOLN: INTRAVENOUS | Status: DC

## 2015-09-05 MED ORDER — ONDANSETRON HCL 4 MG/2ML IJ SOLN
4.0000 mg | Freq: Once | INTRAMUSCULAR | Status: AC
  Administered 2015-09-05: 4 mg via INTRAVENOUS
  Filled 2015-09-05: qty 2

## 2015-09-05 MED ORDER — HYDRALAZINE HCL 20 MG/ML IJ SOLN: 10.0000 mg | INTRAMUSCULAR | Status: DC | PRN

## 2015-09-05 MED ORDER — ONDANSETRON HCL 4 MG/2ML IJ SOLN: 4.0000 mg | Freq: Three times a day (TID) | INTRAMUSCULAR | Status: AC | PRN

## 2015-09-05 MED ORDER — FLUTICASONE PROPIONATE 50 MCG/ACT NA SUSP: 2.0000 | Freq: Every day | NASAL | Status: DC | PRN

## 2015-09-05 MED ORDER — METOPROLOL SUCCINATE ER 25 MG PO TB24: 25.0000 mg | ORAL_TABLET | Freq: Every day | ORAL | Status: DC

## 2015-09-05 MED ORDER — MORPHINE SULFATE (PF) 2 MG/ML IV SOLN: 1.0000 mg | INTRAVENOUS | Status: DC | PRN

## 2015-09-05 MED ORDER — ACETAMINOPHEN 325 MG PO TABS: 650.0000 mg | ORAL_TABLET | Freq: Four times a day (QID) | ORAL | Status: DC | PRN

## 2015-09-05 MED ORDER — ASPIRIN 300 MG RE SUPP
300.0000 mg | Freq: Every day | RECTAL | Status: DC
  Filled 2015-09-05: qty 1

## 2015-09-05 MED ORDER — IOPAMIDOL (ISOVUE-300) INJECTION 61%
100.0000 mL | Freq: Once | INTRAVENOUS | Status: AC | PRN
  Administered 2015-09-05: 100 mL via INTRAVENOUS

## 2015-09-05 MED ORDER — ONDANSETRON HCL 4 MG/2ML IJ SOLN: 4.0000 mg | Freq: Four times a day (QID) | INTRAMUSCULAR | Status: DC | PRN

## 2015-09-05 MED ORDER — ALBUTEROL SULFATE (2.5 MG/3ML) 0.083% IN NEBU: 3.0000 mL | INHALATION_SOLUTION | Freq: Four times a day (QID) | RESPIRATORY_TRACT | Status: DC | PRN

## 2015-09-05 MED ORDER — METOPROLOL SUCCINATE ER 25 MG PO TB24
25.0000 mg | ORAL_TABLET | Freq: Every day | ORAL | Status: DC
  Administered 2015-09-06: 25 mg via ORAL
  Filled 2015-09-05: qty 1

## 2015-09-05 MED ORDER — ACETAMINOPHEN 650 MG RE SUPP: 650.0000 mg | Freq: Four times a day (QID) | RECTAL | Status: DC | PRN

## 2015-09-05 MED ORDER — METOPROLOL TARTRATE 5 MG/5ML IV SOLN
2.5000 mg | Freq: Three times a day (TID) | INTRAVENOUS | Status: DC
  Administered 2015-09-05 (×2): 2.5 mg via INTRAVENOUS
  Filled 2015-09-05 (×2): qty 5

## 2015-09-05 MED ORDER — FENTANYL CITRATE (PF) 100 MCG/2ML IJ SOLN
50.0000 ug | Freq: Once | INTRAMUSCULAR | Status: AC
  Administered 2015-09-05: 50 ug via INTRAVENOUS
  Filled 2015-09-05: qty 2

## 2015-09-05 MED ORDER — ASPIRIN EC 81 MG PO TBEC
81.0000 mg | DELAYED_RELEASE_TABLET | Freq: Every day | ORAL | Status: DC
  Administered 2015-09-05 – 2015-09-06 (×2): 81 mg via ORAL
  Filled 2015-09-05 (×2): qty 1

## 2015-09-05 NOTE — Progress Notes (Signed)
Patient seen and examined. H and P reviewed.  Patient report diarrhea now. Abdomina pain has almost resolved.   1-Small bowel obstruction ; CT findings are concerning for small bowel obstruction. KUB improved.  Started on clear diet/  Appreciate Surgery evaluation.   2-CAD status post PCI - denies any chest pain. Resume oral metoprolol and oral aspirin.  3. Hypertension - when necessary IV hydralazine since patient is nothing by mouth.  [place], [place]

## 2015-09-05 NOTE — Consult Note (Signed)
Reason for Consult: SBO Referring Physician: Dr. Gean Birchwood   HPI: Krystal [name] is a 82 year old female with a history of HTN, CAD, GERD, diverticulosis, IBS who presented with sudden onset abdominal pain which started 2 hours after eating a salad yesterday afternoon.  Location was mid abdomen with radiation the side and back. Severe in severity.  Time pattern was constant.  Associated with dizziness.  Then developed nausea and vomiting x6 times.  Her symptoms were unbearable and she presented to [facility].  She underwent a CT scan of abdomen and pelvis which revealed a small bowel obstruction.  She denies a history of bowel obstructions. Surgical history significant for abdominal hysterectomy.   She has since developed diarrhea.  Pain has resolved.  Nausea and vomiting have resolved.    Past Medical History  Diagnosis Date  . Hypertension   . CAD (coronary artery disease)   . Venous insufficiency   . Hyperlipidemia   . Hiatal hernia   . GERD (gastroesophageal reflux disease)   . Diverticulosis   . Adenomatous colon polyp   . Renal cysts, acquired, bilateral   . Osteoarthritis   . Chronic lower back pain   . Headache(784.0)   . Anxiety   . Allergy     seasonal  . Anemia     pt. denies  . Cataract     cataracts removed left eye.  . Osteoporosis   . IBS (irritable bowel syndrome) 10/13/2011    Past Surgical History  Procedure Laterality Date  . Abdominal hysterectomy    . Varicose vein surgery    . Bladder repair    . Colonoscopy  08/17/2008    adenomatous polyp, diverticulosis, external hemorrhoids  . Cataract extraction      bilateral  . Upper gastrointestinal endoscopy  07/03/2010    hiatal hernia  . Colonoscopy w/ biopsies      multiple   . Left lower extremity venous doppler  06/18/2011    No evidence of left lower extremity DVT  . Cardiac catheterization  01/12/2002    90% stenosis of the left circumflex, stented with a 3x79m Cypher stent, resulting in reduction of  90% to 10%   . Cardiovascular stress test  03/22/2007    Mild inferolateral thinning toward the apex without significant ischemia. Nondiagnostic electrocardiogram.  . Transthoracic echocardiogram  11/18/2012    EF 432-20% LV systolic mild-moderately reduced, mild-moderate mitral valve regurg, mild-moderate tricuspid valve regurg. NSR-LBBB with occas PVCs    Family History  Problem Relation Age of Onset  . Heart disease Father   . Colon cancer Neg Hx   . Stroke Sister     Social History:  reports that she has never smoked. She has never used smokeless tobacco. She reports that she does not drink alcohol or use illicit drugs.  Allergies:  Allergies  Allergen Reactions  . Alendronate Sodium     REACTION: pt states INTOL to Fosamax w/ esophagitis  . Cefdinir     REACTION: diarrhea  . Esomeprazole Magnesium     REACTION: pt states INTOL to Nexium  . Levofloxacin     REACTION: diarrhea  . Omeprazole     REACTION: pt states INTOL to Prilosec  . Other     Pneumonia vaccine---felt tired and achy and sick x 6 months to get over this.  . Penicillins Hives  . Shellfish-Derived Products Hives, Nausea Only and Rash    Medications:  Scheduled Meds: . aspirin  300 mg Rectal Daily  .  metoprolol  2.5 mg Intravenous Q8H   Continuous Infusions: . dextrose 5 % and 0.9% NaCl 75 mL/hr at 09/05/15 0607   PRN Meds:.acetaminophen **OR** acetaminophen, albuterol, fluticasone, hydrALAZINE, morphine injection, ondansetron (ZOFRAN) IV, ondansetron **OR** ondansetron (ZOFRAN) IV   Results for orders placed or performed during the hospital encounter of 09/04/15 (from the past 48 hour(s))  Lipase, blood     Status: None   Collection Time: 09/04/15 11:53 PM  Result Value Ref Range   Lipase 26 11 - 51 U/L  Comprehensive metabolic panel     Status: Abnormal   Collection Time: 09/04/15 11:53 PM  Result Value Ref Range   Sodium 138 135 - 145 mmol/L   Potassium 4.4 3.5 - 5.1 mmol/L   Chloride 102 101  - 111 mmol/L   CO2 23 22 - 32 mmol/L   Glucose, Bld 137 (H) 65 - 99 mg/dL   BUN 20 6 - 20 mg/dL   Creatinine, Ser 0.94 0.44 - 1.00 mg/dL   Calcium 9.6 8.9 - 10.3 mg/dL   Total Protein 7.4 6.5 - 8.1 g/dL   Albumin 4.7 3.5 - 5.0 g/dL   AST 27 15 - 41 U/L   ALT 21 14 - 54 U/L   Alkaline Phosphatase 73 38 - 126 U/L   Total Bilirubin 0.7 0.3 - 1.2 mg/dL   GFR calc non Af Amer 55 (L) >60 mL/min   GFR calc Af Amer >60 >60 mL/min    Comment: (NOTE) The eGFR has been calculated using the CKD EPI equation. This calculation has not been validated in all clinical situations. eGFR's persistently <60 mL/min signify possible Chronic Kidney Disease.    Anion gap 13 5 - 15  CBC     Status: Abnormal   Collection Time: 09/04/15 11:53 PM  Result Value Ref Range   WBC 12.9 (H) 4.0 - 10.5 K/uL   RBC 4.98 3.87 - 5.11 MIL/uL   Hemoglobin 15.5 (H) 12.0 - 15.0 g/dL   HCT 46.3 (H) 36.0 - 46.0 %   MCV 93.0 78.0 - 100.0 fL   MCH 31.1 26.0 - 34.0 pg   MCHC 33.5 30.0 - 36.0 g/dL   RDW 13.6 11.5 - 15.5 %   Platelets 222 150 - 400 K/uL  Differential     Status: Abnormal   Collection Time: 09/04/15 11:53 PM  Result Value Ref Range   Neutrophils Relative % 63 %   Neutro Abs 7.9 (H) 1.7 - 7.7 K/uL   Lymphocytes Relative 29 %   Lymphs Abs 3.6 0.7 - 4.0 K/uL   Monocytes Relative 7 %   Monocytes Absolute 0.9 0.1 - 1.0 K/uL   Eosinophils Relative 1 %   Eosinophils Absolute 0.1 0.0 - 0.7 K/uL   Basophils Relative 0 %   Basophils Absolute 0.0 0.0 - 0.1 K/uL  I-Stat Troponin, ED (not at [facility])     Status: None   Collection Time: 09/04/15 11:55 PM  Result Value Ref Range   Troponin i, poc 0.00 0.00 - 0.08 ng/mL   Comment 3            Comment: Due to the release kinetics of cTnI, a negative result within the first hours of the onset of symptoms does not rule out myocardial infarction with certainty. If myocardial infarction is still suspected, repeat the test at appropriate intervals.   Urinalysis,  Routine w reflex microscopic     Status: None   Collection Time: 09/05/15  1:01 AM  Result Value  Ref Range   Color, Urine YELLOW YELLOW   APPearance CLEAR CLEAR   Specific Gravity, Urine 1.010 1.005 - 1.030   pH 7.0 5.0 - 8.0   Glucose, UA NEGATIVE NEGATIVE mg/dL   Hgb urine dipstick NEGATIVE NEGATIVE   Bilirubin Urine NEGATIVE NEGATIVE   Ketones, ur NEGATIVE NEGATIVE mg/dL   Protein, ur NEGATIVE NEGATIVE mg/dL   Nitrite NEGATIVE NEGATIVE   Leukocytes, UA NEGATIVE NEGATIVE    Comment: MICROSCOPIC NOT DONE ON URINES WITH NEGATIVE PROTEIN, BLOOD, LEUKOCYTES, NITRITE, OR GLUCOSE <1000 mg/dL.  Comprehensive metabolic panel     Status: Abnormal   Collection Time: 09/05/15  5:58 AM  Result Value Ref Range   Sodium 136 135 - 145 mmol/L   Potassium 3.9 3.5 - 5.1 mmol/L   Chloride 102 101 - 111 mmol/L   CO2 22 22 - 32 mmol/L   Glucose, Bld 130 (H) 65 - 99 mg/dL   BUN 21 (H) 6 - 20 mg/dL   Creatinine, Ser 0.96 0.44 - 1.00 mg/dL   Calcium 9.0 8.9 - 10.3 mg/dL   Total Protein 7.3 6.5 - 8.1 g/dL   Albumin 4.4 3.5 - 5.0 g/dL   AST 28 15 - 41 U/L   ALT 20 14 - 54 U/L   Alkaline Phosphatase 71 38 - 126 U/L   Total Bilirubin 1.0 0.3 - 1.2 mg/dL   GFR calc non Af Amer 53 (L) >60 mL/min   GFR calc Af Amer >60 >60 mL/min    Comment: (NOTE) The eGFR has been calculated using the CKD EPI equation. This calculation has not been validated in all clinical situations. eGFR's persistently <60 mL/min signify possible Chronic Kidney Disease.    Anion gap 12 5 - 15  CBC WITH DIFFERENTIAL     Status: Abnormal   Collection Time: 09/05/15  5:58 AM  Result Value Ref Range   WBC 10.6 (H) 4.0 - 10.5 K/uL   RBC 4.82 3.87 - 5.11 MIL/uL   Hemoglobin 15.1 (H) 12.0 - 15.0 g/dL   HCT 43.7 36.0 - 46.0 %   MCV 90.7 78.0 - 100.0 fL   MCH 31.3 26.0 - 34.0 pg   MCHC 34.6 30.0 - 36.0 g/dL   RDW 13.6 11.5 - 15.5 %   Platelets 227 150 - 400 K/uL   Neutrophils Relative % 81 %   Neutro Abs 8.6 (H) 1.7 - 7.7 K/uL    Lymphocytes Relative 14 %   Lymphs Abs 1.5 0.7 - 4.0 K/uL   Monocytes Relative 5 %   Monocytes Absolute 0.5 0.1 - 1.0 K/uL   Eosinophils Relative 0 %   Eosinophils Absolute 0.0 0.0 - 0.7 K/uL   Basophils Relative 0 %   Basophils Absolute 0.0 0.0 - 0.1 K/uL    Ct Abdomen Pelvis W Contrast  09/05/2015  CLINICAL DATA:  Right lower quadrant pain and spasms. EXAM: CT ABDOMEN AND PELVIS WITH CONTRAST TECHNIQUE: Multidetector CT imaging of the abdomen and pelvis was performed using the standard protocol following bolus administration of intravenous contrast. CONTRAST:  146m ISOVUE-300 IOPAMIDOL (ISOVUE-300) INJECTION 61% COMPARISON:  08/27/2010 FINDINGS: Lower chest and abdominal wall:  Small sliding hiatal hernia. Hepatobiliary: No focal liver abnormality.No evidence of biliary obstruction or stone. Pancreas: Unremarkable. Spleen: Unremarkable. Adrenals/Urinary Tract: Negative adrenals. No hydronephrosis or stone. Simple 64 mm right upper pole cyst. Left upper pole renal cortical scarring. Unremarkable bladder. Reproductive:Hysterectomy.  Pelvic floor laxity.  No adnexal mass. Stomach/Bowel: There is dilated and fluid-filled proximal small bowel. Mid  small bowel is less dilated but contains fecalized material. The ileum is collapsed. Most of the colon is evacuated and collapsed. No discrete transition point is seen. No evidence of mass. No evidence of bowel necrosis. Negative for appendicitis. Mild distal colonic diverticulosis. Vascular/Lymphatic: Atherosclerosis. No acute vascular finding. No mass or adenopathy. Peritoneal: No ascites or pneumoperitoneum. Musculoskeletal: No acute abnormalities. IMPRESSION: 1. Dilated proximal and collapsed distal small bowel is consistent with obstruction but a discrete transition point is not identified. 2. Otherwise stable exam compared to 2012, as described. Electronically Signed   By: Monte Fantasia M.D.   On: 09/05/2015 02:33    Review of Systems   Constitutional: Positive for malaise/fatigue. Negative for fever, chills, weight loss and diaphoresis.  Eyes: Negative for blurred vision, double vision, photophobia, pain, discharge and redness.  Respiratory: Negative for cough, hemoptysis, sputum production and wheezing.   Cardiovascular: Negative for chest pain, palpitations, orthopnea, claudication, leg swelling and PND.  Gastrointestinal: Positive for nausea, vomiting, abdominal pain and diarrhea. Negative for heartburn, constipation, blood in stool and melena.  Genitourinary: Negative for dysuria, urgency, frequency, hematuria and flank pain.  Musculoskeletal: Negative for myalgias, back pain, joint pain and falls.  Neurological: Negative for dizziness, tingling, tremors, sensory change, speech change, focal weakness, seizures, loss of consciousness and weakness.  Psychiatric/Behavioral: Negative for memory loss and substance abuse. The patient is not nervous/anxious.    Blood pressure 141/65, pulse 72, temperature 97.8 F (36.6 C), temperature source Oral, resp. rate 22, height 5' 5"  (1.651 m), weight 72.576 kg (160 lb), SpO2 100 %. Physical Exam  Vitals reviewed. Constitutional: She is oriented to person, place, and time. She appears well-developed and well-nourished. No distress.  Cardiovascular: Normal rate, regular rhythm, normal heart sounds and intact distal pulses.  Exam reveals no gallop.   No murmur heard. Respiratory: Effort normal and breath sounds normal. No respiratory distress. She has no wheezes. She has no rales. She exhibits no tenderness.  GI: Soft. Bowel sounds are normal. She exhibits no distension and no mass. There is no tenderness. There is no rebound and no guarding.  Musculoskeletal: Normal range of motion. She exhibits no edema or tenderness.  Neurological: She is alert and oriented to person, place, and time.  Skin: Skin is warm and dry. No rash noted. She is not diaphoretic. No erythema. No pallor.   Psychiatric: She has a normal mood and affect. Her behavior is normal. Judgment and thought content normal.    Assessment/Plan: SBO- No indications for surgical intervention at this time.  Clinically her symptoms are resolving.  Will await film. If there is progression of contrast, will allow for clears.    Addendum: 1047-axr improved.  Will allow for clears.  Mobilize.  Repeat AXR in AM.   Ethelene Closser, [facility] ANP-BC Pager [phone] 09/05/2015, 8:08 AM

## 2015-09-05 NOTE — ED Provider Notes (Signed)
CSN: [id number]     Arrival date & time 09/04/15  2315 History  By signing my name below, I, Georgette Shell, attest that this documentation has been prepared under the direction and in the presence of Shanon Rosser, MD. Electronically Signed: Georgette Shell, ED Scribe. 09/05/2015. 1:22 AM.     Chief Complaint  Patient presents with  . Abdominal Pain    The history is provided by the patient. No language interpreter was used.    HPI Comments: Krystal [name] is a 82 y.o. female with h/o diverticulosis who presents to the Emergency Department complaining of constant 10/10 RLQ abdominal pain and "spasms" that radiates to the back onset yesterday around 4pm after eating. Patient also has associated dizziness and nausea. Pt states the pain is exacerbated with movement. Pt states she took 2 unspecified "spasm" pills that gave no relief. She notes she had two bowel movements after eating, but denies any diarrhea. Patient states the pain is not as severe at this time as it was earlier.  Past Medical History  Diagnosis Date  . Hypertension   . CAD (coronary artery disease)   . Venous insufficiency   . Hyperlipidemia   . Hiatal hernia   . GERD (gastroesophageal reflux disease)   . Diverticulosis   . Adenomatous colon polyp   . Renal cysts, acquired, bilateral   . Osteoarthritis   . Chronic lower back pain   . Headache(784.0)   . Anxiety   . Allergy     seasonal  . Anemia     pt. denies  . Cataract     cataracts removed left eye.  . Osteoporosis   . IBS (irritable bowel syndrome) 10/13/2011   Past Surgical History  Procedure Laterality Date  . Abdominal hysterectomy    . Varicose vein surgery    . Bladder repair    . Colonoscopy  08/17/2008    adenomatous polyp, diverticulosis, external hemorrhoids  . Cataract extraction      bilateral  . Upper gastrointestinal endoscopy  07/03/2010    hiatal hernia  . Colonoscopy w/ biopsies      multiple   . Left lower extremity venous doppler  06/18/2011    No  evidence of left lower extremity DVT  . Cardiac catheterization  01/12/2002    90% stenosis of the left circumflex, stented with a 3x70mm Cypher stent, resulting in reduction of 90% to 10%   . Cardiovascular stress test  03/22/2007    Mild inferolateral thinning toward the apex without significant ischemia. Nondiagnostic electrocardiogram.  . Transthoracic echocardiogram  11/18/2012    EF [id number], LV systolic mild-moderately reduced, mild-moderate mitral valve regurg, mild-moderate tricuspid valve regurg. NSR-LBBB with occas PVCs   Family History  Problem Relation Age of Onset  . Heart disease Father   . Colon cancer Neg Hx   . Stroke Sister    Social History  Substance Use Topics  . Smoking status: Never Smoker   . Smokeless tobacco: Never Used  . Alcohol Use: No   OB History    No data available     Review of Systems A complete 10 system review of systems was obtained and all systems are negative except as noted in the HPI and PMH.   Allergies  Alendronate sodium; Cefdinir; Esomeprazole magnesium; Levofloxacin; Omeprazole; Other; Penicillins; and Shellfish-derived products  Home Medications   Prior to Admission medications   Medication Sig Start Date End Date Taking? Authorizing Provider  acetaminophen (TYLENOL) 500 MG tablet Take 500 mg  by mouth every 4 (four) hours as needed for mild pain or headache.   Yes Historical Provider, MD  albuterol (PROVENTIL HFA;VENTOLIN HFA) 108 (90 BASE) MCG/ACT inhaler Inhale 2 puffs into the lungs every 6 (six) hours as needed for wheezing or shortness of breath. 05/20/14  Yes Burnis Medin, MD  amLODipine (NORVASC) 10 MG tablet Take 1 tablet (10 mg total) by mouth daily. 11/01/13  Yes Laurey Morale, MD  aspirin EC 81 MG tablet Take 81 mg by mouth daily.   Yes Historical Provider, MD  calcium carbonate (OS-CAL) 600 MG TABS tablet Take 600 mg by mouth 2 (two) times daily with a meal.   Yes Historical Provider, MD  cholecalciferol (VITAMIN D) 1000  UNITS tablet Take 2,000 Units by mouth daily.    Yes Historical Provider, MD  fluticasone (FLONASE) 50 MCG/ACT nasal spray Place 2 sprays into both nostrils daily as needed. Patient taking differently: Place 2 sprays into both nostrils daily as needed for allergies.  05/23/15  Yes Janith Lima, MD  lansoprazole (PREVACID) 15 MG capsule Take 15 mg by mouth daily. 08/07/15  Yes Historical Provider, MD  LORazepam (ATIVAN) 1 MG tablet Take 0.5 tablets (0.5 mg total) by mouth at bedtime as needed for anxiety. 05/23/15  Yes Janith Lima, MD  losartan (COZAAR) 50 MG tablet Take 1 tablet (50 mg total) by mouth daily. 11/01/13  Yes Laurey Morale, MD  meloxicam (MOBIC) 7.5 MG tablet Take 1 tablet (7.5 mg total) by mouth daily. 07/11/15  Yes Janith Lima, MD  metoprolol succinate (TOPROL XL) 25 MG 24 hr tablet Take 1 tablet (25 mg total) by mouth daily. 11/01/13  Yes Laurey Morale, MD  Probiotic Product (ALIGN) 4 MG CAPS Take 4 mg by mouth daily.    Yes Historical Provider, MD  simvastatin (ZOCOR) 40 MG tablet Take 40 mg by mouth daily.   Yes Historical Provider, MD  pantoprazole (PROTONIX) 40 MG tablet Take 1 tablet (40 mg total) by mouth daily before breakfast. Patient not taking: Reported on 07/26/2015 07/03/15   Gatha Mayer, MD   BP 127/52 mmHg  Pulse 66  Temp(Src) 98.3 F (36.8 C) (Oral)  Resp 18  Ht 5\' 5"  (1.651 m)  Wt 160 lb (72.576 kg)  BMI 26.63 kg/m2  SpO2 99%   Physical Exam General: Well-developed, well-nourished female in no acute distress; appearance consistent with age of record HENT: normocephalic; atraumatic Eyes: pupils equal, round and reactive to light; extraocular muscles intact, lens implants  Neck: supple Heart: regular rate and rhythm; no murmurs, rubs or gallops Lungs: clear to auscultation bilaterally Abdomen: soft; nondistended; RLQ tenderness; no masses or hepatosplenomegaly; bowel sounds hypoactive Extremities: No deformity; full range of motion; pulses  normal Neurologic: Awake, alert and oriented; motor function intact in all extremities and symmetric; no facial droop Skin: Warm and dry Psychiatric: Normal mood and affect  ED Course  Procedures (including critical care time)   MDM   Nursing notes and vitals signs, including pulse oximetry, reviewed.  Summary of this visit's results, reviewed by myself:  Labs:  Results for orders placed or performed during the hospital encounter of 09/04/15 (from the past 24 hour(s))  Lipase, blood     Status: None   Collection Time: 09/04/15 11:53 PM  Result Value Ref Range   Lipase 26 11 - 51 U/L  Comprehensive metabolic panel     Status: Abnormal   Collection Time: 09/04/15 11:53 PM  Result Value Ref  Range   Sodium 138 135 - 145 mmol/L   Potassium 4.4 3.5 - 5.1 mmol/L   Chloride 102 101 - 111 mmol/L   CO2 23 22 - 32 mmol/L   Glucose, Bld 137 (H) 65 - 99 mg/dL   BUN 20 6 - 20 mg/dL   Creatinine, Ser 0.94 0.44 - 1.00 mg/dL   Calcium 9.6 8.9 - 10.3 mg/dL   Total Protein 7.4 6.5 - 8.1 g/dL   Albumin 4.7 3.5 - 5.0 g/dL   AST 27 15 - 41 U/L   ALT 21 14 - 54 U/L   Alkaline Phosphatase 73 38 - 126 U/L   Total Bilirubin 0.7 0.3 - 1.2 mg/dL   GFR calc non Af Amer 55 (L) >60 mL/min   GFR calc Af Amer >60 >60 mL/min   Anion gap 13 5 - 15  CBC     Status: Abnormal   Collection Time: 09/04/15 11:53 PM  Result Value Ref Range   WBC 12.9 (H) 4.0 - 10.5 K/uL   RBC 4.98 3.87 - 5.11 MIL/uL   Hemoglobin 15.5 (H) 12.0 - 15.0 g/dL   HCT 46.3 (H) 36.0 - 46.0 %   MCV 93.0 78.0 - 100.0 fL   MCH 31.1 26.0 - 34.0 pg   MCHC 33.5 30.0 - 36.0 g/dL   RDW 13.6 11.5 - 15.5 %   Platelets 222 150 - 400 K/uL  Differential     Status: Abnormal   Collection Time: 09/04/15 11:53 PM  Result Value Ref Range   Neutrophils Relative % 63 %   Neutro Abs 7.9 (H) 1.7 - 7.7 K/uL   Lymphocytes Relative 29 %   Lymphs Abs 3.6 0.7 - 4.0 K/uL   Monocytes Relative 7 %   Monocytes Absolute 0.9 0.1 - 1.0 K/uL   Eosinophils  Relative 1 %   Eosinophils Absolute 0.1 0.0 - 0.7 K/uL   Basophils Relative 0 %   Basophils Absolute 0.0 0.0 - 0.1 K/uL  I-Stat Troponin, ED (not at [facility])     Status: None   Collection Time: 09/04/15 11:55 PM  Result Value Ref Range   Troponin i, poc 0.00 0.00 - 0.08 ng/mL   Comment 3          Urinalysis, Routine w reflex microscopic     Status: None   Collection Time: 09/05/15  1:01 AM  Result Value Ref Range   Color, Urine YELLOW YELLOW   APPearance CLEAR CLEAR   Specific Gravity, Urine 1.010 1.005 - 1.030   pH 7.0 5.0 - 8.0   Glucose, UA NEGATIVE NEGATIVE mg/dL   Hgb urine dipstick NEGATIVE NEGATIVE   Bilirubin Urine NEGATIVE NEGATIVE   Ketones, ur NEGATIVE NEGATIVE mg/dL   Protein, ur NEGATIVE NEGATIVE mg/dL   Nitrite NEGATIVE NEGATIVE   Leukocytes, UA NEGATIVE NEGATIVE  Comprehensive metabolic panel     Status: Abnormal   Collection Time: 09/05/15  5:58 AM  Result Value Ref Range   Sodium 136 135 - 145 mmol/L   Potassium 3.9 3.5 - 5.1 mmol/L   Chloride 102 101 - 111 mmol/L   CO2 22 22 - 32 mmol/L   Glucose, Bld 130 (H) 65 - 99 mg/dL   BUN 21 (H) 6 - 20 mg/dL   Creatinine, Ser 0.96 0.44 - 1.00 mg/dL   Calcium 9.0 8.9 - 10.3 mg/dL   Total Protein 7.3 6.5 - 8.1 g/dL   Albumin 4.4 3.5 - 5.0 g/dL   AST 28 15 - 41 U/L  ALT 20 14 - 54 U/L   Alkaline Phosphatase 71 38 - 126 U/L   Total Bilirubin 1.0 0.3 - 1.2 mg/dL   GFR calc non Af Amer 53 (L) >60 mL/min   GFR calc Af Amer >60 >60 mL/min   Anion gap 12 5 - 15  CBC WITH DIFFERENTIAL     Status: Abnormal   Collection Time: 09/05/15  5:58 AM  Result Value Ref Range   WBC 10.6 (H) 4.0 - 10.5 K/uL   RBC 4.82 3.87 - 5.11 MIL/uL   Hemoglobin 15.1 (H) 12.0 - 15.0 g/dL   HCT 43.7 36.0 - 46.0 %   MCV 90.7 78.0 - 100.0 fL   MCH 31.3 26.0 - 34.0 pg   MCHC 34.6 30.0 - 36.0 g/dL   RDW 13.6 11.5 - 15.5 %   Platelets 227 150 - 400 K/uL   Neutrophils Relative % 81 %   Neutro Abs 8.6 (H) 1.7 - 7.7 K/uL   Lymphocytes Relative 14 %    Lymphs Abs 1.5 0.7 - 4.0 K/uL   Monocytes Relative 5 %   Monocytes Absolute 0.5 0.1 - 1.0 K/uL   Eosinophils Relative 0 %   Eosinophils Absolute 0.0 0.0 - 0.7 K/uL   Basophils Relative 0 %   Basophils Absolute 0.0 0.0 - 0.1 K/uL    Imaging Studies: Dg Abd 1 View  09/05/2015  CLINICAL DATA:  Nausea, mid low abdomen discomfort EXAM: ABDOMEN - 1 VIEW COMPARISON:  CT abdomen pelvis of 09/05/2015 FINDINGS: There is slight gaseous distention of a few small-bowel loops consistent with persistent small-bowel obstruction. No colonic bowel gas is seen. Contrast is noted filling the urinary bladder. The bones are somewhat osteopenic. IMPRESSION: Suspect persistent partial low-grade small bowel obstruction. Electronically Signed   By: Ivar Drape M.D.   On: 09/05/2015 08:30   Ct Abdomen Pelvis W Contrast  09/05/2015  CLINICAL DATA:  Right lower quadrant pain and spasms. EXAM: CT ABDOMEN AND PELVIS WITH CONTRAST TECHNIQUE: Multidetector CT imaging of the abdomen and pelvis was performed using the standard protocol following bolus administration of intravenous contrast. CONTRAST:  13mL ISOVUE-300 IOPAMIDOL (ISOVUE-300) INJECTION 61% COMPARISON:  08/27/2010 FINDINGS: Lower chest and abdominal wall:  Small sliding hiatal hernia. Hepatobiliary: No focal liver abnormality.No evidence of biliary obstruction or stone. Pancreas: Unremarkable. Spleen: Unremarkable. Adrenals/Urinary Tract: Negative adrenals. No hydronephrosis or stone. Simple 64 mm right upper pole cyst. Left upper pole renal cortical scarring. Unremarkable bladder. Reproductive:Hysterectomy.  Pelvic floor laxity.  No adnexal mass. Stomach/Bowel: There is dilated and fluid-filled proximal small bowel. Mid small bowel is less dilated but contains fecalized material. The ileum is collapsed. Most of the colon is evacuated and collapsed. No discrete transition point is seen. No evidence of mass. No evidence of bowel necrosis. Negative for appendicitis. Mild  distal colonic diverticulosis. Vascular/Lymphatic: Atherosclerosis. No acute vascular finding. No mass or adenopathy. Peritoneal: No ascites or pneumoperitoneum. Musculoskeletal: No acute abnormalities. IMPRESSION: 1. Dilated proximal and collapsed distal small bowel is consistent with obstruction but a discrete transition point is not identified. 2. Otherwise stable exam compared to 2012, as described. Electronically Signed   By: Monte Fantasia M.D.   On: 09/05/2015 02:33   [facility] to admit, [facility] consulted and will see patient this morning.  Final diagnoses:  Small bowel obstruction ([place])   I personally performed the services described in this documentation, which was scribed in my presence. The recorded information has been reviewed and is accurate.  Shanon Rosser, MD 09/05/15 [id number]

## 2015-09-05 NOTE — ED Notes (Signed)
Patient transported to CT 

## 2015-09-05 NOTE — Care Management Obs Status (Signed)
[place] NOTIFICATION   Patient Details  Name: Krystal [name] MRN: [id number] Date of Birth: December 07, 1929   Medicare Observation Status Notification Given:  Yes MOON and C44 given; pt signed and copy given to pt; original to CMA to be scanned into medical record. Dellie Catholic, RN 09/05/2015, 3:25 PM

## 2015-09-05 NOTE — ED Notes (Signed)
MD at bedside. 

## 2015-09-05 NOTE — H&P (Signed)
History and Physical    KISSY FELLER [id number] DOB: Feb 10, 1930 DOA: 09/04/2015  PCP: Scarlette Calico, MD  Patient coming from: Home.  Chief Complaint: Abdominal pain.  HPI: Krystal [name] is a 82 y.o. female with medical history significant of CAD status post stenting, hypertension, hyperlipidemia presents with sudden onset of abdominal pain last evening. In the ER patient also had some diarrhea and multiple episodes of vomiting. CT of the abdomen and pelvis shows features concerning for small bowel obstruction. On-call general surgeon Dr. Excell Seltzer was consulted and patient will be admitted for further management. Patient denies any chest pain or shortness of breath. Abdominal pain is more around the navel. Patient had at least 2 episodes of diarrhea after coming to the hospital.   ED Course: CT of the abdomen and pelvis shows features concerning for obstruction.  Review of Systems: As per HPI, rest all negative.   Past Medical History  Diagnosis Date  . Hypertension   . CAD (coronary artery disease)   . Venous insufficiency   . Hyperlipidemia   . Hiatal hernia   . GERD (gastroesophageal reflux disease)   . Diverticulosis   . Adenomatous colon polyp   . Renal cysts, acquired, bilateral   . Osteoarthritis   . Chronic lower back pain   . Headache(784.0)   . Anxiety   . Allergy     seasonal  . Anemia     pt. denies  . Cataract     cataracts removed left eye.  . Osteoporosis   . IBS (irritable bowel syndrome) 10/13/2011    Past Surgical History  Procedure Laterality Date  . Abdominal hysterectomy    . Varicose vein surgery    . Bladder repair    . Colonoscopy  08/17/2008    adenomatous polyp, diverticulosis, external hemorrhoids  . Cataract extraction      bilateral  . Upper gastrointestinal endoscopy  07/03/2010    hiatal hernia  . Colonoscopy w/ biopsies      multiple   . Left lower extremity venous doppler  06/18/2011    No evidence of left lower extremity DVT  .  Cardiac catheterization  01/12/2002    90% stenosis of the left circumflex, stented with a 3x57mm Cypher stent, resulting in reduction of 90% to 10%   . Cardiovascular stress test  03/22/2007    Mild inferolateral thinning toward the apex without significant ischemia. Nondiagnostic electrocardiogram.  . Transthoracic echocardiogram  11/18/2012    EF [id number], LV systolic mild-moderately reduced, mild-moderate mitral valve regurg, mild-moderate tricuspid valve regurg. NSR-LBBB with occas PVCs     reports that she has never smoked. She has never used smokeless tobacco. She reports that she does not drink alcohol or use illicit drugs.  Allergies  Allergen Reactions  . Alendronate Sodium     REACTION: pt states INTOL to Fosamax w/ esophagitis  . Cefdinir     REACTION: diarrhea  . Esomeprazole Magnesium     REACTION: pt states INTOL to Nexium  . Levofloxacin     REACTION: diarrhea  . Omeprazole     REACTION: pt states INTOL to Prilosec  . Other     Pneumonia vaccine---felt tired and achy and sick x 6 months to get over this.  . Penicillins Hives  . Shellfish-Derived Products Hives, Nausea Only and Rash    Family History  Problem Relation Age of Onset  . Heart disease Father   . Colon cancer Neg Hx   . Stroke Sister  Prior to Admission medications   Medication Sig Start Date End Date Taking? Authorizing Provider  acetaminophen (TYLENOL) 500 MG tablet Take 500 mg by mouth every 4 (four) hours as needed for mild pain or headache.   Yes Historical Provider, MD  albuterol (PROVENTIL HFA;VENTOLIN HFA) 108 (90 BASE) MCG/ACT inhaler Inhale 2 puffs into the lungs every 6 (six) hours as needed for wheezing or shortness of breath. 05/20/14  Yes Burnis Medin, MD  amLODipine (NORVASC) 10 MG tablet Take 1 tablet (10 mg total) by mouth daily. 11/01/13  Yes Laurey Morale, MD  aspirin EC 81 MG tablet Take 81 mg by mouth daily.   Yes Historical Provider, MD  calcium carbonate (OS-CAL) 600 MG TABS  tablet Take 600 mg by mouth 2 (two) times daily with a meal.   Yes Historical Provider, MD  cholecalciferol (VITAMIN D) 1000 UNITS tablet Take 2,000 Units by mouth daily.    Yes Historical Provider, MD  fluticasone (FLONASE) 50 MCG/ACT nasal spray Place 2 sprays into both nostrils daily as needed. Patient taking differently: Place 2 sprays into both nostrils daily as needed for allergies.  05/23/15  Yes Janith Lima, MD  lansoprazole (PREVACID) 15 MG capsule Take 15 mg by mouth daily. 08/07/15  Yes Historical Provider, MD  LORazepam (ATIVAN) 1 MG tablet Take 0.5 tablets (0.5 mg total) by mouth at bedtime as needed for anxiety. 05/23/15  Yes Janith Lima, MD  losartan (COZAAR) 50 MG tablet Take 1 tablet (50 mg total) by mouth daily. 11/01/13  Yes Laurey Morale, MD  meloxicam (MOBIC) 7.5 MG tablet Take 1 tablet (7.5 mg total) by mouth daily. 07/11/15  Yes Janith Lima, MD  metoprolol succinate (TOPROL XL) 25 MG 24 hr tablet Take 1 tablet (25 mg total) by mouth daily. 11/01/13  Yes Laurey Morale, MD  Probiotic Product (ALIGN) 4 MG CAPS Take 4 mg by mouth daily.    Yes Historical Provider, MD  simvastatin (ZOCOR) 40 MG tablet Take 40 mg by mouth daily.   Yes Historical Provider, MD  pantoprazole (PROTONIX) 40 MG tablet Take 1 tablet (40 mg total) by mouth daily before breakfast. Patient not taking: Reported on 07/26/2015 07/03/15   Gatha Mayer, MD    Physical Exam: Filed Vitals:   09/04/15 2344 09/05/15 0113 09/05/15 0311 09/05/15 0430  BP: 154/83  151/78 139/61  Pulse: 71  85 71  Temp:  97.8 F (36.6 C)  97.8 F (36.6 C)  TempSrc:  Oral  Oral  Resp: 20  18 22   Height: 5\' 5"  (1.651 m)     Weight: 160 lb (72.576 kg)     SpO2: 100%  93% 100%      Constitutional: Not in distress. Filed Vitals:   09/04/15 2344 09/05/15 0113 09/05/15 0311 09/05/15 0430  BP: 154/83  151/78 139/61  Pulse: 71  85 71  Temp:  97.8 F (36.6 C)  97.8 F (36.6 C)  TempSrc:  Oral  Oral  Resp: 20  18 22   Height:  5\' 5"  (1.651 m)     Weight: 160 lb (72.576 kg)     SpO2: 100%  93% 100%   Eyes: Anicteric no pallor. ENMT: No discharge from the ears eyes nose and mouth. Neck: No mass felt. No neck rigidity. Respiratory: No rhonchi or crepitations. Cardiovascular: S1 and S2 heard. Abdomen: Soft nontender bowel sounds present. No guarding or rigidity. Musculoskeletal: No edema. Skin: No rash. Neurologic: Alert awake oriented to time  place and person. Moves all extremities. Psychiatric: Appears normal.   Labs on Admission: I have personally reviewed following labs and imaging studies  CBC:  Recent Labs Lab 09/04/15 2353  WBC 12.9*  NEUTROABS 7.9*  HGB 15.5*  HCT 46.3*  MCV 93.0  PLT [id number]   Basic Metabolic Panel:  Recent Labs Lab 09/04/15 2353  NA 138  K 4.4  CL 102  CO2 23  GLUCOSE 137*  BUN 20  CREATININE 0.94  CALCIUM 9.6   GFR: Estimated Creatinine Clearance: 45.2 mL/min (by C-G formula based on Cr of 0.94). Liver Function Tests:  Recent Labs Lab 09/04/15 2353  AST 27  ALT 21  ALKPHOS 73  BILITOT 0.7  PROT 7.4  ALBUMIN 4.7    Recent Labs Lab 09/04/15 2353  LIPASE 26   No results for input(s): AMMONIA in the last 168 hours. Coagulation Profile: No results for input(s): INR, PROTIME in the last 168 hours. Cardiac Enzymes: No results for input(s): CKTOTAL, CKMB, CKMBINDEX, TROPONINI in the last 168 hours. BNP (last 3 results) No results for input(s): PROBNP in the last 8760 hours. HbA1C: No results for input(s): HGBA1C in the last 72 hours. CBG: No results for input(s): GLUCAP in the last 168 hours. Lipid Profile: No results for input(s): CHOL, HDL, LDLCALC, TRIG, CHOLHDL, LDLDIRECT in the last 72 hours. Thyroid Function Tests: No results for input(s): TSH, T4TOTAL, FREET4, T3FREE, THYROIDAB in the last 72 hours. Anemia Panel: No results for input(s): VITAMINB12, FOLATE, FERRITIN, TIBC, IRON, RETICCTPCT in the last 72 hours. Urine analysis:      Component Value Date/Time   COLORURINE YELLOW 09/05/2015 0101   APPEARANCEUR CLEAR 09/05/2015 0101   LABSPEC 1.010 09/05/2015 0101   PHURINE 7.0 09/05/2015 0101   GLUCOSEU NEGATIVE 09/05/2015 0101   GLUCOSEU NEGATIVE 11/09/2007 0733   HGBUR NEGATIVE 09/05/2015 0101   BILIRUBINUR NEGATIVE 09/05/2015 0101   KETONESUR NEGATIVE 09/05/2015 0101   PROTEINUR NEGATIVE 09/05/2015 0101   UROBILINOGEN 0.2 06/09/2014 1458   NITRITE NEGATIVE 09/05/2015 0101   LEUKOCYTESUR NEGATIVE 09/05/2015 0101   Sepsis Labs: @LABRCNTIP (procalcitonin:4,lacticidven:4) )No results found for this or any previous visit (from the past 240 hour(s)).   Radiological Exams on Admission: Ct Abdomen Pelvis W Contrast  09/05/2015  CLINICAL DATA:  Right lower quadrant pain and spasms. EXAM: CT ABDOMEN AND PELVIS WITH CONTRAST TECHNIQUE: Multidetector CT imaging of the abdomen and pelvis was performed using the standard protocol following bolus administration of intravenous contrast. CONTRAST:  175mL ISOVUE-300 IOPAMIDOL (ISOVUE-300) INJECTION 61% COMPARISON:  08/27/2010 FINDINGS: Lower chest and abdominal wall:  Small sliding hiatal hernia. Hepatobiliary: No focal liver abnormality.No evidence of biliary obstruction or stone. Pancreas: Unremarkable. Spleen: Unremarkable. Adrenals/Urinary Tract: Negative adrenals. No hydronephrosis or stone. Simple 64 mm right upper pole cyst. Left upper pole renal cortical scarring. Unremarkable bladder. Reproductive:Hysterectomy.  Pelvic floor laxity.  No adnexal mass. Stomach/Bowel: There is dilated and fluid-filled proximal small bowel. Mid small bowel is less dilated but contains fecalized material. The ileum is collapsed. Most of the colon is evacuated and collapsed. No discrete transition point is seen. No evidence of mass. No evidence of bowel necrosis. Negative for appendicitis. Mild distal colonic diverticulosis. Vascular/Lymphatic: Atherosclerosis. No acute vascular finding. No mass or  adenopathy. Peritoneal: No ascites or pneumoperitoneum. Musculoskeletal: No acute abnormalities. IMPRESSION: 1. Dilated proximal and collapsed distal small bowel is consistent with obstruction but a discrete transition point is not identified. 2. Otherwise stable exam compared to 2012, as described. Electronically Signed   By:  Monte Fantasia M.D.   On: 09/05/2015 02:33     Assessment/Plan Principal Problem:   Small bowel obstruction (HCC) Active Problems:   Hyperlipidemia with target LDL less than 70   Essential hypertension   Coronary atherosclerosis   SBO (small bowel obstruction) ([place])    #1. Small bowel obstruction - CT findings are concerning for small bowel obstruction. However patient had at least 2 episodes of diarrhea after coming to the hospital. At this time I'll keep patient nothing by mouth and if patient has further episodes of vomiting will place NG tube. Repeat KUB has been ordered at this time. Pain medications and IV fluids. Further recommendations per surgery. His address further episodes of diarrhea will need stool studies to rule out gastroenteritis. #2. CAD status post PCI - denies any chest pain. I have placed patient on scheduled dose of IV metoprolol and per rectal aspirin. #3. Hypertension - when necessary IV hydralazine since patient is nothing by mouth.   DVT prophylaxis: SCDs. Code Status: Full code.  Family Communication: No family at the bedside.  Disposition Plan: Home.  Consults called: General surgery.  Admission status: Inpatient. MedSurg.    Rise Patience MD [facility] Pager [phone].  If 7PM-7AM, please contact night-coverage [email or web address] Password [email or web address]  09/05/2015, 5:20 AM

## 2015-09-06 ENCOUNTER — Observation Stay (HOSPITAL_COMMUNITY): Payer: PPO

## 2015-09-06 DIAGNOSIS — Z0389 Encounter for observation for other suspected diseases and conditions ruled out: Secondary | ICD-10-CM | POA: Diagnosis not present

## 2015-09-06 DIAGNOSIS — K5660 Unspecified intestinal obstruction: Secondary | ICD-10-CM | POA: Diagnosis not present

## 2015-09-06 DIAGNOSIS — K5669 Other intestinal obstruction: Secondary | ICD-10-CM | POA: Diagnosis not present

## 2015-09-06 LAB — CBC
HEMATOCRIT: 37.5 % (ref 36.0–46.0)
Hemoglobin: 12.4 g/dL (ref 12.0–15.0)
MCH: 30.6 pg (ref 26.0–34.0)
MCHC: 33.1 g/dL (ref 30.0–36.0)
MCV: 92.6 fL (ref 78.0–100.0)
PLATELETS: 173 10*3/uL (ref 150–400)
RBC: 4.05 MIL/uL (ref 3.87–5.11)
RDW: 14 % (ref 11.5–15.5)
WBC: 5.8 10*3/uL (ref 4.0–10.5)

## 2015-09-06 LAB — BASIC METABOLIC PANEL
Anion gap: 4 — ABNORMAL LOW (ref 5–15)
BUN: 10 mg/dL (ref 6–20)
CHLORIDE: 112 mmol/L — AB (ref 101–111)
CO2: 27 mmol/L (ref 22–32)
CREATININE: 0.68 mg/dL (ref 0.44–1.00)
Calcium: 7.9 mg/dL — ABNORMAL LOW (ref 8.9–10.3)
GFR calc Af Amer: >60 mL/min (ref >60)
GFR calc non Af Amer: >60 mL/min (ref >60)
Glucose, Bld: 119 mg/dL — ABNORMAL HIGH (ref 65–99)
POTASSIUM: 4 mmol/L (ref 3.5–5.1)
Sodium: 143 mmol/L (ref 135–145)

## 2015-09-06 LAB — GLUCOSE, CAPILLARY: Glucose-Capillary: 105 mg/dL — ABNORMAL HIGH (ref 65–99)

## 2015-09-06 MED ORDER — AMLODIPINE BESYLATE 10 MG PO TABS: 10.0000 mg | ORAL_TABLET | Freq: Every day | ORAL | Status: DC

## 2015-09-06 NOTE — Progress Notes (Signed)
Discharge instructions discussed with patient and family, verbalized understanding and agreement ?

## 2015-09-06 NOTE — Discharge Summary (Signed)
Physician Discharge Summary  Krystal [name] [id number] DOB: Mar 11, 1930 DOA: 09/04/2015  PCP: Scarlette Calico, MD  Admit date: 09/04/2015 Discharge date: 09/06/2015  Time spent: 35 minutes  Recommendations for Outpatient Follow-up:  Needs repeat electrolytes.  Resolution of SBO  Discharge Diagnoses:  Principal Problem:   Small bowel obstruction (HCC) Active Problems:   Hyperlipidemia with target LDL less than 70   Essential hypertension   Coronary atherosclerosis   SBO (small bowel obstruction) ([place])   Discharge Condition: stable.   Diet recommendation: full liquid diet   Filed Weights   09/04/15 2344  Weight: 72.576 kg (160 lb)    History of present illness:  Krystal [name] is a 81 y.o. female with medical history significant of CAD status post stenting, hypertension, hyperlipidemia presents with sudden onset of abdominal pain last evening. In the ER patient also had some diarrhea and multiple episodes of vomiting. CT of the abdomen and pelvis shows features concerning for small bowel obstruction. On-call general surgeon Dr. Excell Seltzer was consulted and patient will be admitted for further management. Patient denies any chest pain or shortness of breath. Abdominal pain is more around the navel. Patient had at least 2 episodes of diarrhea after coming to the hospital.   Hospital Course:  1-Small bowel obstruction ; CT findings are concerning for small bowel obstruction. KUB improved.  Tolerating full liquid diet. Plan to discharge today. Patient will be instructed to eat low residue diet after one or two days of tolerating full liquid.  Appreciate Surgery evaluation. Ok to discharge today per surgery   2-CAD status post PCI - denies any chest pain. Resume oral metoprolol and oral aspirin.  3. Hypertension - when necessary IV hydralazine since patient is nothing by mouth. Resume home medications at discharge   Procedures:  none  Consultations:  Surgery   Discharge  Exam: Filed Vitals:   09/05/15 2147 09/06/15 0548  BP: 127/52 143/60  Pulse: 66 69  Temp: 98.3 F (36.8 C) 98.5 F (36.9 C)  Resp: 18 18    General: NAD Cardiovascular: S 1, S 2 RRR Respiratory: CTA Abdomen; soft, nt, nd  Discharge Instructions    Current Discharge Medication List    CONTINUE these medications which have NOT CHANGED   Details  acetaminophen (TYLENOL) 500 MG tablet Take 500 mg by mouth every 4 (four) hours as needed for mild pain or headache.    albuterol (PROVENTIL HFA;VENTOLIN HFA) 108 (90 BASE) MCG/ACT inhaler Inhale 2 puffs into the lungs every 6 (six) hours as needed for wheezing or shortness of breath. Qty: 1 Inhaler, Refills: 1    amLODipine (NORVASC) 10 MG tablet Take 1 tablet (10 mg total) by mouth daily. Qty: 90 tablet, Refills: 3    aspirin EC 81 MG tablet Take 81 mg by mouth daily.    calcium carbonate (OS-CAL) 600 MG TABS tablet Take 600 mg by mouth 2 (two) times daily with a meal.    cholecalciferol (VITAMIN D) 1000 UNITS tablet Take 2,000 Units by mouth daily.     fluticasone (FLONASE) 50 MCG/ACT nasal spray Place 2 sprays into both nostrils daily as needed. Qty: 16 g, Refills: 11   Associated Diagnoses: Allergic rhinitis due to pollen    lansoprazole (PREVACID) 15 MG capsule Take 15 mg by mouth daily.    LORazepam (ATIVAN) 1 MG tablet Take 0.5 tablets (0.5 mg total) by mouth at bedtime as needed for anxiety. Qty: 30 tablet, Refills: 3   Associated Diagnoses: GAD (generalized anxiety disorder)  losartan (COZAAR) 50 MG tablet Take 1 tablet (50 mg total) by mouth daily. Qty: 90 tablet, Refills: 3    meloxicam (MOBIC) 7.5 MG tablet Take 1 tablet (7.5 mg total) by mouth daily. Qty: 90 tablet, Refills: 1   Associated Diagnoses: Chronic low back pain without sciatica, unspecified back pain laterality; Primary osteoarthritis involving multiple joints    metoprolol succinate (TOPROL XL) 25 MG 24 hr tablet Take 1 tablet (25 mg total) by  mouth daily. Qty: 90 tablet, Refills: 3    Probiotic Product (ALIGN) 4 MG CAPS Take 4 mg by mouth daily.     simvastatin (ZOCOR) 40 MG tablet Take 40 mg by mouth daily.    pantoprazole (PROTONIX) 40 MG tablet Take 1 tablet (40 mg total) by mouth daily before breakfast. Qty: 90 tablet, Refills: 3       Allergies  Allergen Reactions  . Alendronate Sodium     REACTION: pt states INTOL to Fosamax w/ esophagitis  . Cefdinir     REACTION: diarrhea  . Esomeprazole Magnesium     REACTION: pt states INTOL to Nexium  . Levofloxacin     REACTION: diarrhea  . Omeprazole     REACTION: pt states INTOL to Prilosec  . Other     Pneumonia vaccine---felt tired and achy and sick x 6 months to get over this.  . Penicillins Hives  . Shellfish-Derived Products Hives, Nausea Only and Rash      The results of significant diagnostics from this hospitalization (including imaging, microbiology, ancillary and laboratory) are listed below for reference.    Significant Diagnostic Studies: Dg Abd 1 View  09/05/2015  CLINICAL DATA:  Nausea, mid low abdomen discomfort EXAM: ABDOMEN - 1 VIEW COMPARISON:  CT abdomen pelvis of 09/05/2015 FINDINGS: There is slight gaseous distention of a few small-bowel loops consistent with persistent small-bowel obstruction. No colonic bowel gas is seen. Contrast is noted filling the urinary bladder. The bones are somewhat osteopenic. IMPRESSION: Suspect persistent partial low-grade small bowel obstruction. Electronically Signed   By: Ivar Drape M.D.   On: 09/05/2015 08:30   Ct Abdomen Pelvis W Contrast  09/05/2015  CLINICAL DATA:  Right lower quadrant pain and spasms. EXAM: CT ABDOMEN AND PELVIS WITH CONTRAST TECHNIQUE: Multidetector CT imaging of the abdomen and pelvis was performed using the standard protocol following bolus administration of intravenous contrast. CONTRAST:  186mL ISOVUE-300 IOPAMIDOL (ISOVUE-300) INJECTION 61% COMPARISON:  08/27/2010 FINDINGS: Lower chest and  abdominal wall:  Small sliding hiatal hernia. Hepatobiliary: No focal liver abnormality.No evidence of biliary obstruction or stone. Pancreas: Unremarkable. Spleen: Unremarkable. Adrenals/Urinary Tract: Negative adrenals. No hydronephrosis or stone. Simple 64 mm right upper pole cyst. Left upper pole renal cortical scarring. Unremarkable bladder. Reproductive:Hysterectomy.  Pelvic floor laxity.  No adnexal mass. Stomach/Bowel: There is dilated and fluid-filled proximal small bowel. Mid small bowel is less dilated but contains fecalized material. The ileum is collapsed. Most of the colon is evacuated and collapsed. No discrete transition point is seen. No evidence of mass. No evidence of bowel necrosis. Negative for appendicitis. Mild distal colonic diverticulosis. Vascular/Lymphatic: Atherosclerosis. No acute vascular finding. No mass or adenopathy. Peritoneal: No ascites or pneumoperitoneum. Musculoskeletal: No acute abnormalities. IMPRESSION: 1. Dilated proximal and collapsed distal small bowel is consistent with obstruction but a discrete transition point is not identified. 2. Otherwise stable exam compared to 2012, as described. Electronically Signed   By: Monte Fantasia M.D.   On: 09/05/2015 02:33   Dg Abd 2 Views  09/06/2015  CLINICAL DATA:  Evaluate for possible ileus. EXAM: ABDOMEN - 2 VIEW COMPARISON:  09/05/2015. FINDINGS: Again noted is an air-filled loop of small bowel which measures up to 3 cm. This may reflect focal ileus or partial obstruction. The remaining small bowel loops have a normal caliber. No significant fluid levels. Gas and stool noted throughout the colon. IMPRESSION: 1. Persistently dilated left lower quadrant small bowel loop compatible with focal ileus or partial obstruction. Electronically Signed   By: Kerby Moors M.D.   On: 09/06/2015 10:18    Microbiology: No results found for this or any previous visit (from the past 240 hour(s)).   Labs: Basic Metabolic  Panel:  Recent Labs Lab 09/04/15 2353 09/05/15 0558 09/06/15 0411  NA 138 136 143  K 4.4 3.9 4.0  CL 102 102 112*  CO2 23 22 27   GLUCOSE 137* 130* 119*  BUN 20 21* 10  CREATININE 0.94 0.96 0.68  CALCIUM 9.6 9.0 7.9*   Liver Function Tests:  Recent Labs Lab 09/04/15 2353 09/05/15 0558  AST 27 28  ALT 21 20  ALKPHOS 73 71  BILITOT 0.7 1.0  PROT 7.4 7.3  ALBUMIN 4.7 4.4    Recent Labs Lab 09/04/15 2353  LIPASE 26   No results for input(s): AMMONIA in the last 168 hours. CBC:  Recent Labs Lab 09/04/15 2353 09/05/15 0558 09/06/15 0411  WBC 12.9* 10.6* 5.8  NEUTROABS 7.9* 8.6*  --   HGB 15.5* 15.1* 12.4  HCT 46.3* 43.7 37.5  MCV 93.0 90.7 92.6  PLT 222 227 173   Cardiac Enzymes: No results for input(s): CKTOTAL, CKMB, CKMBINDEX, TROPONINI in the last 168 hours. BNP: BNP (last 3 results) No results for input(s): BNP in the last 8760 hours.  ProBNP (last 3 results) No results for input(s): PROBNP in the last 8760 hours.  CBG:  Recent Labs Lab 09/05/15 2352 09/06/15 0744  GLUCAP 127* 105*       Signed:  Niel Hummer A MD.  [facility] 09/06/2015, 12:32 PM

## 2015-09-06 NOTE — Progress Notes (Signed)
Patient ID: Krystal [name], female   DOB: 1929/06/11, 82 y.o.   MRN: [id number]     [facility]      [address] [place], [place] [address]    Phone: [phone] FAX: [phone]     Subjective: No n/v.  No further diarrhea. Passing flatus. Tolerating clears.  Ambulating. Eager to go home.   Objective:  Vital signs:  Filed Vitals:   09/05/15 0655 09/05/15 1352 09/05/15 2147 09/06/15 0548  BP: 141/65 166/72 127/52 143/60  Pulse: 72 73 66 69  Temp:  98 F (36.7 C) 98.3 F (36.8 C) 98.5 F (36.9 C)  TempSrc:  Oral Oral Oral  Resp:  18 18 18   Height:      Weight:      SpO2:  100% 99% 98%    Last BM Date: 09/05/15  Intake/Output   Yesterday:  06/07 0701 - 06/08 0700 In: 2391.3 [P.O.:600; I.V.:1791.3] Out: -  This shift:     Physical Exam: General: Pt awake/alert/oriented x4 in no acute distress  Abdomen: Soft.  Nondistended.  Non tender.  No evidence of peritonitis.  No incarcerated hernias.   Problem List:   Principal Problem:   Small bowel obstruction (HCC) Active Problems:   Hyperlipidemia with target LDL less than 70   Essential hypertension   Coronary atherosclerosis   SBO (small bowel obstruction) (HCC)    Results:   Labs: Results for orders placed or performed during the hospital encounter of 09/04/15 (from the past 48 hour(s))  Lipase, blood     Status: None   Collection Time: 09/04/15 11:53 PM  Result Value Ref Range   Lipase 26 11 - 51 U/L  Comprehensive metabolic panel     Status: Abnormal   Collection Time: 09/04/15 11:53 PM  Result Value Ref Range   Sodium 138 135 - 145 mmol/L   Potassium 4.4 3.5 - 5.1 mmol/L   Chloride 102 101 - 111 mmol/L   CO2 23 22 - 32 mmol/L   Glucose, Bld 137 (H) 65 - 99 mg/dL   BUN 20 6 - 20 mg/dL   Creatinine, Ser 0.94 0.44 - 1.00 mg/dL   Calcium 9.6 8.9 - 10.3 mg/dL   Total Protein 7.4 6.5 - 8.1 g/dL   Albumin 4.7 3.5 - 5.0 g/dL   AST 27 15 - 41 U/L    ALT 21 14 - 54 U/L   Alkaline Phosphatase 73 38 - 126 U/L   Total Bilirubin 0.7 0.3 - 1.2 mg/dL   GFR calc non Af Amer 55 (L) >60 mL/min   GFR calc Af Amer >60 >60 mL/min    Comment: (NOTE) The eGFR has been calculated using the CKD EPI equation. This calculation has not been validated in all clinical situations. eGFR's persistently <60 mL/min signify possible Chronic Kidney Disease.    Anion gap 13 5 - 15  CBC     Status: Abnormal   Collection Time: 09/04/15 11:53 PM  Result Value Ref Range   WBC 12.9 (H) 4.0 - 10.5 K/uL   RBC 4.98 3.87 - 5.11 MIL/uL   Hemoglobin 15.5 (H) 12.0 - 15.0 g/dL   HCT 46.3 (H) 36.0 - 46.0 %   MCV 93.0 78.0 - 100.0 fL   MCH 31.1 26.0 - 34.0 pg   MCHC 33.5 30.0 - 36.0 g/dL   RDW 13.6 11.5 - 15.5 %   Platelets 222 150 - 400 K/uL  Differential  Status: Abnormal   Collection Time: 09/04/15 11:53 PM  Result Value Ref Range   Neutrophils Relative % 63 %   Neutro Abs 7.9 (H) 1.7 - 7.7 K/uL   Lymphocytes Relative 29 %   Lymphs Abs 3.6 0.7 - 4.0 K/uL   Monocytes Relative 7 %   Monocytes Absolute 0.9 0.1 - 1.0 K/uL   Eosinophils Relative 1 %   Eosinophils Absolute 0.1 0.0 - 0.7 K/uL   Basophils Relative 0 %   Basophils Absolute 0.0 0.0 - 0.1 K/uL  I-Stat Troponin, ED (not at [facility])     Status: None   Collection Time: 09/04/15 11:55 PM  Result Value Ref Range   Troponin i, poc 0.00 0.00 - 0.08 ng/mL   Comment 3            Comment: Due to the release kinetics of cTnI, a negative result within the first hours of the onset of symptoms does not rule out myocardial infarction with certainty. If myocardial infarction is still suspected, repeat the test at appropriate intervals.   Urinalysis, Routine w reflex microscopic     Status: None   Collection Time: 09/05/15  1:01 AM  Result Value Ref Range   Color, Urine YELLOW YELLOW   APPearance CLEAR CLEAR   Specific Gravity, Urine 1.010 1.005 - 1.030   pH 7.0 5.0 - 8.0   Glucose, UA NEGATIVE NEGATIVE mg/dL    Hgb urine dipstick NEGATIVE NEGATIVE   Bilirubin Urine NEGATIVE NEGATIVE   Ketones, ur NEGATIVE NEGATIVE mg/dL   Protein, ur NEGATIVE NEGATIVE mg/dL   Nitrite NEGATIVE NEGATIVE   Leukocytes, UA NEGATIVE NEGATIVE    Comment: MICROSCOPIC NOT DONE ON URINES WITH NEGATIVE PROTEIN, BLOOD, LEUKOCYTES, NITRITE, OR GLUCOSE <1000 mg/dL.  Comprehensive metabolic panel     Status: Abnormal   Collection Time: 09/05/15  5:58 AM  Result Value Ref Range   Sodium 136 135 - 145 mmol/L   Potassium 3.9 3.5 - 5.1 mmol/L   Chloride 102 101 - 111 mmol/L   CO2 22 22 - 32 mmol/L   Glucose, Bld 130 (H) 65 - 99 mg/dL   BUN 21 (H) 6 - 20 mg/dL   Creatinine, Ser 0.96 0.44 - 1.00 mg/dL   Calcium 9.0 8.9 - 10.3 mg/dL   Total Protein 7.3 6.5 - 8.1 g/dL   Albumin 4.4 3.5 - 5.0 g/dL   AST 28 15 - 41 U/L   ALT 20 14 - 54 U/L   Alkaline Phosphatase 71 38 - 126 U/L   Total Bilirubin 1.0 0.3 - 1.2 mg/dL   GFR calc non Af Amer 53 (L) >60 mL/min   GFR calc Af Amer >60 >60 mL/min    Comment: (NOTE) The eGFR has been calculated using the CKD EPI equation. This calculation has not been validated in all clinical situations. eGFR's persistently <60 mL/min signify possible Chronic Kidney Disease.    Anion gap 12 5 - 15  CBC WITH DIFFERENTIAL     Status: Abnormal   Collection Time: 09/05/15  5:58 AM  Result Value Ref Range   WBC 10.6 (H) 4.0 - 10.5 K/uL   RBC 4.82 3.87 - 5.11 MIL/uL   Hemoglobin 15.1 (H) 12.0 - 15.0 g/dL   HCT 43.7 36.0 - 46.0 %   MCV 90.7 78.0 - 100.0 fL   MCH 31.3 26.0 - 34.0 pg   MCHC 34.6 30.0 - 36.0 g/dL   RDW 13.6 11.5 - 15.5 %   Platelets 227 150 - 400  K/uL   Neutrophils Relative % 81 %   Neutro Abs 8.6 (H) 1.7 - 7.7 K/uL   Lymphocytes Relative 14 %   Lymphs Abs 1.5 0.7 - 4.0 K/uL   Monocytes Relative 5 %   Monocytes Absolute 0.5 0.1 - 1.0 K/uL   Eosinophils Relative 0 %   Eosinophils Absolute 0.0 0.0 - 0.7 K/uL   Basophils Relative 0 %   Basophils Absolute 0.0 0.0 - 0.1 K/uL   Glucose, capillary     Status: Abnormal   Collection Time: 09/05/15 11:52 PM  Result Value Ref Range   Glucose-Capillary 127 (H) 65 - 99 mg/dL  Basic metabolic panel     Status: Abnormal   Collection Time: 09/06/15  4:11 AM  Result Value Ref Range   Sodium 143 135 - 145 mmol/L    Comment: RESULT REPEATED AND VERIFIED DELTA CHECK NOTED    Potassium 4.0 3.5 - 5.1 mmol/L   Chloride 112 (H) 101 - 111 mmol/L   CO2 27 22 - 32 mmol/L   Glucose, Bld 119 (H) 65 - 99 mg/dL   BUN 10 6 - 20 mg/dL   Creatinine, Ser 0.68 0.44 - 1.00 mg/dL   Calcium 7.9 (L) 8.9 - 10.3 mg/dL   GFR calc non Af Amer >60 >60 mL/min   GFR calc Af Amer >60 >60 mL/min    Comment: (NOTE) The eGFR has been calculated using the CKD EPI equation. This calculation has not been validated in all clinical situations. eGFR's persistently <60 mL/min signify possible Chronic Kidney Disease.    Anion gap 4 (L) 5 - 15  CBC     Status: None   Collection Time: 09/06/15  4:11 AM  Result Value Ref Range   WBC 5.8 4.0 - 10.5 K/uL   RBC 4.05 3.87 - 5.11 MIL/uL   Hemoglobin 12.4 12.0 - 15.0 g/dL   HCT 37.5 36.0 - 46.0 %   MCV 92.6 78.0 - 100.0 fL   MCH 30.6 26.0 - 34.0 pg   MCHC 33.1 30.0 - 36.0 g/dL   RDW 14.0 11.5 - 15.5 %   Platelets 173 150 - 400 K/uL  Glucose, capillary     Status: Abnormal   Collection Time: 09/06/15  7:44 AM  Result Value Ref Range   Glucose-Capillary 105 (H) 65 - 99 mg/dL    Imaging / Studies: Dg Abd 1 View  09/05/2015  CLINICAL DATA:  Nausea, mid low abdomen discomfort EXAM: ABDOMEN - 1 VIEW COMPARISON:  CT abdomen pelvis of 09/05/2015 FINDINGS: There is slight gaseous distention of a few small-bowel loops consistent with persistent small-bowel obstruction. No colonic bowel gas is seen. Contrast is noted filling the urinary bladder. The bones are somewhat osteopenic. IMPRESSION: Suspect persistent partial low-grade small bowel obstruction. Electronically Signed   By: Ivar Drape M.D.   On: 09/05/2015  08:30   Ct Abdomen Pelvis W Contrast  09/05/2015  CLINICAL DATA:  Right lower quadrant pain and spasms. EXAM: CT ABDOMEN AND PELVIS WITH CONTRAST TECHNIQUE: Multidetector CT imaging of the abdomen and pelvis was performed using the standard protocol following bolus administration of intravenous contrast. CONTRAST:  153m ISOVUE-300 IOPAMIDOL (ISOVUE-300) INJECTION 61% COMPARISON:  08/27/2010 FINDINGS: Lower chest and abdominal wall:  Small sliding hiatal hernia. Hepatobiliary: No focal liver abnormality.No evidence of biliary obstruction or stone. Pancreas: Unremarkable. Spleen: Unremarkable. Adrenals/Urinary Tract: Negative adrenals. No hydronephrosis or stone. Simple 64 mm right upper pole cyst. Left upper pole renal cortical scarring. Unremarkable bladder. Reproductive:Hysterectomy.  Pelvic  floor laxity.  No adnexal mass. Stomach/Bowel: There is dilated and fluid-filled proximal small bowel. Mid small bowel is less dilated but contains fecalized material. The ileum is collapsed. Most of the colon is evacuated and collapsed. No discrete transition point is seen. No evidence of mass. No evidence of bowel necrosis. Negative for appendicitis. Mild distal colonic diverticulosis. Vascular/Lymphatic: Atherosclerosis. No acute vascular finding. No mass or adenopathy. Peritoneal: No ascites or pneumoperitoneum. Musculoskeletal: No acute abnormalities. IMPRESSION: 1. Dilated proximal and collapsed distal small bowel is consistent with obstruction but a discrete transition point is not identified. 2. Otherwise stable exam compared to 2012, as described. Electronically Signed   By: Monte Fantasia M.D.   On: 09/05/2015 02:33    Medications / Allergies:  Scheduled Meds: . aspirin EC  81 mg Oral Daily  . metoprolol succinate  25 mg Oral Daily   Continuous Infusions:  PRN Meds:.acetaminophen **OR** acetaminophen, albuterol, fluticasone, hydrALAZINE, morphine injection, ondansetron **OR** ondansetron (ZOFRAN)  IV  Antibiotics: Anti-infectives    None        Assessment/Plan pSBO-clinically resolved.  Films may be lagging, check AXR today.  Advance to fulls.  If better and able to tolerate fulls, ok to dc from a surgical standpoint.   Erby Pian, [facility] Pager [phone]([id number])   09/06/2015 8:02 AM

## 2015-09-06 NOTE — Discharge Instructions (Signed)
Small Bowel Obstruction °A small bowel obstruction is a blockage in the small bowel. The small bowel, which is also called the small intestine, is a long, slender tube that connects the stomach to the colon. When a person eats and drinks, food and fluids go from the stomach to the small bowel. This is where most of the nutrients in the food and fluids are absorbed. °A small bowel obstruction will prevent food and fluids from passing through the small bowel as they normally do during digestion. The small bowel can become partially or completely blocked. This can cause symptoms such as abdominal pain, vomiting, and bloating. If this condition is not treated, it can be dangerous because the small bowel could rupture. °CAUSES °Common causes of this condition include: °· Scar tissue from previous surgery or radiation treatment. °· Recent surgery. This may cause the movements of the bowel to slow down and cause food to block the intestine. °· Hernias. °· Inflammatory bowel disease (colitis). °· Twisting of the bowel (volvulus). °· Tumors. °· A foreign body. °· Slipping of a part of the bowel into another part (intussusception). °SYMPTOMS °Symptoms of this condition include: °· Abdominal pain. This may be dull cramps or sharp pain. It may occur in one area, or it may be present in the entire abdomen. Pain can range from mild to severe, depending on the degree of obstruction. °· Nausea and vomiting. Vomit may be greenish or a yellow bile color. °· Abdominal bloating. °· Constipation. °· Lack of passing gas. °· Frequent belching. °· Diarrhea. This may occur if the obstruction is partial and runny stool is able to leak around the obstruction. °DIAGNOSIS °This condition may be diagnosed based on a physical exam, medical history, and X-rays of the abdomen. You may also have other tests, such as a CT scan of the abdomen and pelvis. °TREATMENT °Treatment for this condition depends on the cause and severity of the problem.  Treatment options may include: °· Bed rest along with fluids and pain medicines that are given through an IV tube inserted into one of your veins. Sometimes, this is all that is needed for the obstruction to improve. °· Following a simple diet. In some cases, a clear liquid diet may be required for several days. This allows the bowel to rest. °· Placement of a small tube (nasogastric tube) into the stomach. When the bowel is blocked, it usually swells up like a balloon that is filled with air and fluids. The air and fluids may be removed by suction through the nasogastric tube. This can help with pain, discomfort, and nausea. It can also help the obstruction to clear up faster. °· Surgery. This may be required if other treatments do not work. Bowel obstruction from a hernia may require early surgery and can be an emergency procedure. Surgery may also be required for scar tissue that causes frequent or severe obstructions. °HOME CARE INSTRUCTIONS °· Get plenty of rest. °· Follow instructions from your health care provider about eating restrictions. You may need to avoid solid foods and consume only clear liquids until your condition improves. °· Take over-the-counter and prescription medicines only as told by your health care provider. °· Keep all follow-up visits as told by your health care provider. This is important. °SEEK MEDICAL CARE IF: °· You have a fever. °· You have chills. °SEEK IMMEDIATE MEDICAL CARE IF: °· You have increased pain or cramping. °· You vomit blood. °· You have uncontrolled vomiting or nausea. °· You cannot drink   fluids because of vomiting or pain. °· You develop confusion. °· You begin feeling very dry or thirsty (dehydrated). °· You have severe bloating. °· You feel extremely weak or you faint. °  °This information is not intended to replace advice given to you by your health care provider. Make sure you discuss any questions you have with your health care provider. °  °Document Released:  06/03/2005 Document Revised: 12/06/2014 Document Reviewed: 05/11/2014 °Elsevier Interactive Patient Education ©2016 [employer] ° ° °

## 2015-09-10 ENCOUNTER — Telehealth: Payer: Self-pay | Admitting: Internal Medicine

## 2015-09-10 NOTE — Telephone Encounter (Signed)
Patient was in the hospital last week for SBO. She had one episode of diarrhea this am.  She has a rx of metronidazole that was given to her at the last office visit for diarrhea. She asks if she should start on it.  She is advised that with only one episode of diarrhea she should wait.  I did schedule her an office visit for 6/14 with Alonza Bogus, PA at 11:00

## 2015-09-11 ENCOUNTER — Encounter: Payer: Self-pay | Admitting: Internal Medicine

## 2015-09-11 ENCOUNTER — Ambulatory Visit (INDEPENDENT_AMBULATORY_CARE_PROVIDER_SITE_OTHER): Payer: PPO | Admitting: Internal Medicine

## 2015-09-11 VITALS — BP 120/80 | HR 72 | Ht 65.0 in | Wt 158.0 lb

## 2015-09-11 DIAGNOSIS — K219 Gastro-esophageal reflux disease without esophagitis: Secondary | ICD-10-CM | POA: Diagnosis not present

## 2015-09-11 DIAGNOSIS — K565 Intestinal adhesions [bands], unspecified as to partial versus complete obstruction: Secondary | ICD-10-CM

## 2015-09-11 DIAGNOSIS — K5730 Diverticulosis of large intestine without perforation or abscess without bleeding: Secondary | ICD-10-CM | POA: Diagnosis not present

## 2015-09-11 NOTE — Patient Instructions (Addendum)
Low-Fiber Diet Fiber is found in fruits, vegetables, and whole grains. A low-fiber diet restricts fibrous foods that are not digested in the small intestine. A diet containing about 10-15 grams of fiber per day is considered low fiber. Low-fiber diets may be used to:  Promote healing and rest the bowel during intestinal flare-ups.  Prevent blockage of a partially obstructed or narrowed gastrointestinal tract.  Reduce fecal weight and volume.  Slow the movement of feces. You may be on a low-fiber diet as a transitional diet following surgery, after an injury (trauma), or because of a short (acute) or lifelong (chronic) illness. Your health care provider will determine the length of time you need to stay on this diet.  WHAT DO I NEED TO KNOW ABOUT A LOW-FIBER DIET? Always check the fiber content on the packaging's Nutrition Facts label, especially on foods from the grains list. Ask your dietitian if you have questions about specific foods that are related to your condition, especially if the food is not listed below. In general, a low-fiber food will have less than 2 g of fiber. WHAT FOODS CAN I EAT? Grains All breads and crackers made with white flour. Sweet rolls, doughnuts, waffles, pancakes, [place] toast, bagels. Pretzels, Melba toast, zwieback. Well-cooked cereals, such as cornmeal, farina, or cream cereals. Dry cereals that do not contain whole grains, fruit, or nuts, such as refined corn, wheat, rice, and oat cereals. Potatoes prepared any way without skins, plain pastas and noodles, refined white rice. Use white flour for baking and making sauces. Use allowed list of grains for casseroles, dumplings, and puddings.  Vegetables Strained tomato and vegetable juices. Fresh lettuce, cucumber, spinach. Well-cooked (no skin or pulp) or canned vegetables, such as asparagus, bean sprouts, beets, carrots, green beans, mushrooms, potatoes, pumpkin, spinach, yellow squash, tomato sauce/puree, turnips,  yams, and zucchini. Keep servings limited to  cup.  Fruits All fruit juices except prune juice. Cooked or canned fruits without skin and seeds, such as applesauce, apricots, cherries, fruit cocktail, grapefruit, grapes, mandarin oranges, melons, peaches, pears, pineapple, and plums. Fresh fruits without skin, such as apricots, avocados, bananas, melons, pineapple, nectarines, and peaches. Keep servings limited to  cup or 1 piece.  Meat and Other Protein Sources Ground or well-cooked tender beef, ham, veal, lamb, pork, or poultry. Eggs, plain cheese. Fish, oysters, shrimp, lobster, and other seafood. Liver, organ meats. Smooth nut butters. Dairy All milk products and alternative dairy substitutes, such as soy, rice, almond, and coconut, not containing added whole nuts, seeds, or added fruit. Beverages Decaf coffee, fruit, and vegetable juices or smoothies (small amounts, with no pulp or skins, and with fruits from allowed list), sports drinks, herbal tea. Condiments Ketchup, mustard, vinegar, cream sauce, cheese sauce, cocoa powder. Spices in moderation, such as allspice, basil, bay leaves, celery powder or leaves, cinnamon, cumin powder, curry powder, ginger, mace, marjoram, onion or garlic powder, oregano, paprika, parsley flakes, ground pepper, rosemary, sage, savory, tarragon, thyme, and turmeric. Sweets and Desserts Plain cakes and cookies, pie made with allowed fruit, pudding, custard, cream pie. Gelatin, fruit, ice, sherbet, frozen ice pops. Ice cream, ice milk without nuts. Plain hard candy, honey, jelly, molasses, syrup, sugar, chocolate syrup, gumdrops, marshmallows. Limit overall sugar intake.  Fats and Oil Margarine, butter, cream, mayonnaise, salad oils, plain salad dressings made from allowed foods. Choose healthy fats such as olive oil, canola oil, and omega-3 fatty acids (such as found in salmon or tuna) when possible.  Other Bouillon, broth, or cream soups made  from allowed foods.  Any strained soup. Casseroles or mixed dishes made with allowed foods. The items listed above may not be a complete list of recommended foods or beverages. Contact your dietitian for more options.  WHAT FOODS ARE NOT RECOMMENDED? Grains All whole wheat and whole grain breads and crackers. Multigrains, rye, bran seeds, nuts, or coconut. Cereals containing whole grains, multigrains, bran, coconut, nuts, raisins. Cooked or dry oatmeal, steel-cut oats. Coarse wheat cereals, granola. Cereals advertised as high fiber. Potato skins. Whole grain pasta, wild or brown rice. Popcorn. Coconut flour. Bran, buckwheat, corn bread, multigrains, rye, wheat germ.  Vegetables Fresh, cooked or canned vegetables, such as artichokes, asparagus, beet greens, broccoli, Brussels sprouts, cabbage, celery, cauliflower, corn, eggplant, kale, legumes or beans, okra, peas, and tomatoes. Avoid large servings of any vegetables, especially raw vegetables.  Fruits Fresh fruits, such as apples with or without skin, berries, cherries, figs, grapes, grapefruit, guavas, kiwis, mangoes, oranges, papayas, pears, persimmons, pineapple, and pomegranate. Prune juice and juices with pulp, stewed or dried prunes. Dried fruits, dates, raisins. Fruit seeds or skins. Avoid large servings of all fresh fruits. Meats and Other Protein Sources Tough, fibrous meats with gristle. Chunky nut butter. Cheese made with seeds, nuts, or other foods not recommended. Nuts, seeds, legumes (beans, including baked beans), dried peas, beans, lentils.  Dairy Yogurt or cheese that contains nuts, seeds, or added fruit.  Beverages Fruit juices with high pulp, prune juice. Caffeinated coffee and teas.  Condiments Coconut, maple syrup, pickles, olives. Sweets and Desserts Desserts, cookies, or candies that contain nuts or coconut, chunky peanut butter, dried fruits. Jams, preserves with seeds, marmalade. Large amounts of sugar and sweets. Any other dessert made with  fruits from the not recommended list.  Other Soups made from vegetables that are not recommended or that contain other foods not recommended.  The items listed above may not be a complete list of foods and beverages to avoid. Contact your dietitian for more information.   This information is not intended to replace advice given to you by your health care  Slowly add back in vegetables to your diet.  Thank you for choosing [facility]  Dr Silvano Rusk   Follow up as needed.

## 2015-09-11 NOTE — Progress Notes (Signed)
   Subjective:    Patient ID: Krystal [name], female    DOB: [birth year], 82 y.o.   MRN: [id number] Cc: f/u after SBO HPI Recently hospitalized w/ SBO - spontaneously resolved and is getting back to NL. Wonders what happened and what she can do to prevent recurrence She does eat a lot of raw vegetables GERD ok back on lansoprazole 15 mg as she thought 40 mg pantoprazole was too strong  Wonders why she sees little balls of stool at times Medications, allergies, past medical history, past surgical history, family history and social history are reviewed and updated in the EMR.  Review of Systems As above    Objective:   Physical Exam NAD BP 120/80 mmHg  Pulse 72  Ht 5\' 5"  (1.651 m)  Wt 158 lb (71.668 kg)  BMI 26.29 kg/m2 Abd soft and NT   I reviewed labs and hospital notes in EMR    Assessment & Plan:   Encounter Diagnoses  Name Primary?  . Small bowel obstruction due to adhesions ([place]) Yes  . Diverticulosis of colon without hemorrhage   . Gastroesophageal reflux disease, esophagitis presence not specified     Low fiber diet for now then NL but limit raw veggies Continue current medications Explained that stool balls likely due to segmentation and fecaliths from diverticulosis F/u prn  Cc:Scarlette Calico, MD

## 2015-09-12 ENCOUNTER — Ambulatory Visit: Payer: PPO | Admitting: Gastroenterology

## 2015-09-13 ENCOUNTER — Encounter: Payer: Self-pay | Admitting: Internal Medicine

## 2015-10-01 DIAGNOSIS — J019 Acute sinusitis, unspecified: Secondary | ICD-10-CM | POA: Diagnosis not present

## 2015-10-01 DIAGNOSIS — R51 Headache: Secondary | ICD-10-CM | POA: Diagnosis not present

## 2015-10-03 ENCOUNTER — Ambulatory Visit (INDEPENDENT_AMBULATORY_CARE_PROVIDER_SITE_OTHER): Payer: PPO | Admitting: Internal Medicine

## 2015-10-03 ENCOUNTER — Encounter: Payer: Self-pay | Admitting: Internal Medicine

## 2015-10-03 VITALS — BP 140/80 | HR 70 | Temp 97.8°F | Resp 16 | Wt 160.0 lb

## 2015-10-03 DIAGNOSIS — E785 Hyperlipidemia, unspecified: Secondary | ICD-10-CM

## 2015-10-03 DIAGNOSIS — I10 Essential (primary) hypertension: Secondary | ICD-10-CM | POA: Diagnosis not present

## 2015-10-03 DIAGNOSIS — I2510 Atherosclerotic heart disease of native coronary artery without angina pectoris: Secondary | ICD-10-CM

## 2015-10-03 DIAGNOSIS — Z23 Encounter for immunization: Secondary | ICD-10-CM | POA: Diagnosis not present

## 2015-10-03 MED ORDER — PRAVASTATIN SODIUM 40 MG PO TABS: 40.0000 mg | ORAL_TABLET | Freq: Every day | ORAL | Status: DC

## 2015-10-03 NOTE — Progress Notes (Signed)
Subjective:  Patient ID: Krystal [name], female    DOB: 1929/07/01  Age: 82 y.o. MRN: [id number]  CC: Allergic Rhinitis ; Hypertension; and Hyperlipidemia   HPI Krystal [name] presents for follow-up on a recent flareup of her sinus symptoms. She was seen a few days ago in an [facility] and was treated for allergic rhinitis with a steroid shot. She tells me she is feeling much better. She has not been using the Flonase nasal spray consistently. She had been complaining of worsening nocturnal symptoms including postnasal drip, congestion, and runny nose.  She also complains of muscle aches in her thighs and wants to change her statin from simvastatin to something else. She has been warned that there is a drug interaction between simvastatin and amlodipine.  Outpatient Prescriptions Prior to Visit  Medication Sig Dispense Refill  . acetaminophen (TYLENOL) 500 MG tablet Take 500 mg by mouth every 4 (four) hours as needed for mild pain or headache.    . albuterol (PROVENTIL HFA;VENTOLIN HFA) 108 (90 BASE) MCG/ACT inhaler Inhale 2 puffs into the lungs every 6 (six) hours as needed for wheezing or shortness of breath. 1 Inhaler 1  . amLODipine (NORVASC) 10 MG tablet Take 1 tablet (10 mg total) by mouth daily. 90 tablet 3  . aspirin EC 81 MG tablet Take 81 mg by mouth daily.    . calcium carbonate (OS-CAL) 600 MG TABS tablet Take 600 mg by mouth 2 (two) times daily with a meal.    . cholecalciferol (VITAMIN D) 1000 UNITS tablet Take 2,000 Units by mouth daily.     . fluticasone (FLONASE) 50 MCG/ACT nasal spray Place 2 sprays into both nostrils daily as needed. (Patient taking differently: Place 2 sprays into both nostrils daily as needed for allergies. ) 16 g 11  . lansoprazole (PREVACID) 15 MG capsule Take 15 mg by mouth daily.    Marland Kitchen LORazepam (ATIVAN) 1 MG tablet Take 0.5 tablets (0.5 mg total) by mouth at bedtime as needed for anxiety. 30 tablet 3  . losartan (COZAAR) 50 MG tablet Take 1 tablet (50 mg  total) by mouth daily. 90 tablet 3  . meloxicam (MOBIC) 7.5 MG tablet Take 1 tablet (7.5 mg total) by mouth daily. 90 tablet 1  . metoprolol succinate (TOPROL XL) 25 MG 24 hr tablet Take 1 tablet (25 mg total) by mouth daily. 90 tablet 3  . Probiotic Product (ALIGN) 4 MG CAPS Take 4 mg by mouth daily.     . simvastatin (ZOCOR) 40 MG tablet Take 40 mg by mouth daily.     No facility-administered medications prior to visit.    ROS Review of Systems  Constitutional: Negative.  Negative for fever, chills, diaphoresis, appetite change and fatigue.  HENT: Positive for congestion, postnasal drip and rhinorrhea. Negative for facial swelling, sinus pressure, sore throat and trouble swallowing.   Eyes: Negative.   Respiratory: Negative.  Negative for cough, choking, chest tightness, shortness of breath and stridor.   Cardiovascular: Negative.  Negative for chest pain, palpitations and leg swelling.  Gastrointestinal: Negative.  Negative for nausea, vomiting, abdominal pain, diarrhea and constipation.  Endocrine: Negative.   Genitourinary: Negative.  Negative for dysuria, urgency and difficulty urinating.  Musculoskeletal: Positive for myalgias. Negative for back pain, arthralgias and neck pain.  Skin: Negative.  Negative for color change and rash.  Allergic/Immunologic: Negative.   Neurological: Negative.  Negative for dizziness, tremors, weakness, numbness and headaches.  Hematological: Negative.  Negative for adenopathy. Does not  bruise/bleed easily.  Psychiatric/Behavioral: Negative.     Objective:  BP 140/80 mmHg  Pulse 70  Temp(Src) 97.8 F (36.6 C) (Oral)  Resp 16  Wt 160 lb (72.576 kg)  SpO2 97%  BP Readings from Last 3 Encounters:  10/03/15 140/80  09/11/15 120/80  09/06/15 159/80    Wt Readings from Last 3 Encounters:  10/03/15 160 lb (72.576 kg)  09/11/15 158 lb (71.668 kg)  09/04/15 160 lb (72.576 kg)    Physical Exam  Constitutional: She is oriented to person,  place, and time. No distress.  HENT:  Mouth/Throat: Oropharynx is clear and moist. No oropharyngeal exudate.  Eyes: Conjunctivae are normal. Right eye exhibits no discharge. Left eye exhibits no discharge. No scleral icterus.  Neck: Normal range of motion. Neck supple. No JVD present. No tracheal deviation present. No thyromegaly present.  Cardiovascular: Normal rate, regular rhythm, normal heart sounds and intact distal pulses.  Exam reveals no gallop and no friction rub.   No murmur heard. Pulmonary/Chest: Effort normal and breath sounds normal. No stridor. No respiratory distress. She has no wheezes. She has no rales. She exhibits no tenderness.  Abdominal: Soft. Bowel sounds are normal. She exhibits no distension and no mass. There is no tenderness. There is no rebound and no guarding.  Musculoskeletal: Normal range of motion. She exhibits no edema or tenderness.  Lymphadenopathy:    She has no cervical adenopathy.  Neurological: She is oriented to person, place, and time.  Skin: Skin is warm and dry. No rash noted. She is not diaphoretic. No erythema. No pallor.  Vitals reviewed.   Lab Results  Component Value Date   WBC 5.8 09/06/2015   HGB 12.4 09/06/2015   HCT 37.5 09/06/2015   PLT 173 09/06/2015   GLUCOSE 119* 09/06/2015   CHOL 151 02/20/2015   TRIG 139 02/20/2015   HDL 48 02/20/2015   LDLDIRECT 160.4 12/19/2008   LDLCALC 75 02/20/2015   ALT 20 09/05/2015   AST 28 09/05/2015   NA 143 09/06/2015   K 4.0 09/06/2015   CL 112* 09/06/2015   CREATININE 0.68 09/06/2015   BUN 10 09/06/2015   CO2 27 09/06/2015   TSH 1.56 05/23/2015   HGBA1C 5.9 05/23/2015    Dg Abd 1 View  09/05/2015  CLINICAL DATA:  Nausea, mid low abdomen discomfort EXAM: ABDOMEN - 1 VIEW COMPARISON:  CT abdomen pelvis of 09/05/2015 FINDINGS: There is slight gaseous distention of a few small-bowel loops consistent with persistent small-bowel obstruction. No colonic bowel gas is seen. Contrast is noted  filling the urinary bladder. The bones are somewhat osteopenic. IMPRESSION: Suspect persistent partial low-grade small bowel obstruction. Electronically Signed   By: Ivar Drape M.D.   On: 09/05/2015 08:30   Ct Abdomen Pelvis W Contrast  09/05/2015  CLINICAL DATA:  Right lower quadrant pain and spasms. EXAM: CT ABDOMEN AND PELVIS WITH CONTRAST TECHNIQUE: Multidetector CT imaging of the abdomen and pelvis was performed using the standard protocol following bolus administration of intravenous contrast. CONTRAST:  11mL ISOVUE-300 IOPAMIDOL (ISOVUE-300) INJECTION 61% COMPARISON:  08/27/2010 FINDINGS: Lower chest and abdominal wall:  Small sliding hiatal hernia. Hepatobiliary: No focal liver abnormality.No evidence of biliary obstruction or stone. Pancreas: Unremarkable. Spleen: Unremarkable. Adrenals/Urinary Tract: Negative adrenals. No hydronephrosis or stone. Simple 64 mm right upper pole cyst. Left upper pole renal cortical scarring. Unremarkable bladder. Reproductive:Hysterectomy.  Pelvic floor laxity.  No adnexal mass. Stomach/Bowel: There is dilated and fluid-filled proximal small bowel. Mid small bowel is less dilated  but contains fecalized material. The ileum is collapsed. Most of the colon is evacuated and collapsed. No discrete transition point is seen. No evidence of mass. No evidence of bowel necrosis. Negative for appendicitis. Mild distal colonic diverticulosis. Vascular/Lymphatic: Atherosclerosis. No acute vascular finding. No mass or adenopathy. Peritoneal: No ascites or pneumoperitoneum. Musculoskeletal: No acute abnormalities. IMPRESSION: 1. Dilated proximal and collapsed distal small bowel is consistent with obstruction but a discrete transition point is not identified. 2. Otherwise stable exam compared to 2012, as described. Electronically Signed   By: Monte Fantasia M.D.   On: 09/05/2015 02:33    Assessment & Plan:   Malaiah was seen today for allergic rhinitis , hypertension and  hyperlipidemia.  Diagnoses and all orders for this visit:  Atherosclerosis of native coronary artery of native heart without angina pectoris- she has had no recent episodes of angina, at her request we'll change her statin from simvastatin to pravastatin. -     pravastatin (PRAVACHOL) 40 MG tablet; Take 1 tablet (40 mg total) by mouth daily.  Essential hypertension- her blood pressure is well-controlled.  Hyperlipidemia with target LDL less than 70- she has muscle aches related to simvastatin therapy so I have asked her to change to pravastatin. -     pravastatin (PRAVACHOL) 40 MG tablet; Take 1 tablet (40 mg total) by mouth daily.  Need for Tdap vaccination -     Tdap vaccine greater than or equal to 7yo IM  I have discontinued Ms. Touchette's simvastatin. I am also having her start on pravastatin. Additionally, I am having her maintain her ALIGN, aspirin EC, amLODipine, metoprolol succinate, losartan, albuterol, cholecalciferol, calcium carbonate, acetaminophen, LORazepam, fluticasone, meloxicam, and lansoprazole.  Meds ordered this encounter  Medications  . pravastatin (PRAVACHOL) 40 MG tablet    Sig: Take 1 tablet (40 mg total) by mouth daily.    Dispense:  30 tablet    Refill:  11     Follow-up: Return in about 6 months (around 04/04/2016).  Scarlette Calico, MD

## 2015-10-03 NOTE — Patient Instructions (Signed)

## 2015-10-03 NOTE — Progress Notes (Signed)
Pre visit review using our clinic review tool, if applicable. No additional management support is needed unless otherwise documented below in the visit note. 

## 2015-10-09 ENCOUNTER — Telehealth: Payer: Self-pay | Admitting: Internal Medicine

## 2015-10-09 NOTE — Telephone Encounter (Signed)
Patient advised that ok to take stool softeners as needed.  She will call back for any additional questions or concerns,.

## 2015-10-11 DIAGNOSIS — N3020 Other chronic cystitis without hematuria: Secondary | ICD-10-CM | POA: Diagnosis not present

## 2015-10-19 DIAGNOSIS — Z961 Presence of intraocular lens: Secondary | ICD-10-CM | POA: Diagnosis not present

## 2015-10-19 DIAGNOSIS — H26493 Other secondary cataract, bilateral: Secondary | ICD-10-CM | POA: Diagnosis not present

## 2015-10-19 DIAGNOSIS — H5212 Myopia, left eye: Secondary | ICD-10-CM | POA: Diagnosis not present

## 2015-10-19 DIAGNOSIS — H43813 Vitreous degeneration, bilateral: Secondary | ICD-10-CM | POA: Diagnosis not present

## 2015-11-05 ENCOUNTER — Telehealth: Payer: Self-pay | Admitting: Internal Medicine

## 2015-11-05 ENCOUNTER — Telehealth: Payer: Self-pay

## 2015-11-05 MED ORDER — LANSOPRAZOLE 15 MG PO CPDR: 15.0000 mg | DELAYED_RELEASE_CAPSULE | Freq: Every day | ORAL | 1 refills | Status: DC

## 2015-11-05 MED ORDER — AMLODIPINE BESYLATE 10 MG PO TABS: 10.0000 mg | ORAL_TABLET | Freq: Every day | ORAL | 3 refills | Status: DC

## 2015-11-05 MED ORDER — METOPROLOL SUCCINATE ER 25 MG PO TB24: 25.0000 mg | ORAL_TABLET | Freq: Every day | ORAL | 3 refills | Status: DC

## 2015-11-05 MED ORDER — LOSARTAN POTASSIUM 50 MG PO TABS: 50.0000 mg | ORAL_TABLET | Freq: Every day | ORAL | 3 refills | Status: DC

## 2015-11-05 NOTE — Telephone Encounter (Signed)
Pt called back and said that she would like to try Crestor.  If so she would like this called in

## 2015-11-05 NOTE — Telephone Encounter (Signed)
Refill of her lansoprazole sent in as requested.

## 2015-11-05 NOTE — Telephone Encounter (Signed)
Pt left message on triage. [place] for BP meds sent to new pharmacy.   Drug to Drug indication with simvastatin 40 mg and amlodipine.  Please advise if current dose can be dispensed or if a lower dose would be as effective.   Pt requested rx of simvastatin 40 mg. This was on an external pharmacy list and has been added.

## 2015-11-05 NOTE — Telephone Encounter (Signed)
I am confused, was  she not able to tolerate simvastatin or pravastatin?

## 2015-11-05 NOTE — Telephone Encounter (Signed)
Pt stated that she was not able to tolerate the simvastatin.

## 2015-11-05 NOTE — Telephone Encounter (Signed)
This was addressed a month ago The simvastatin has been discontinued and changed to pravastatin

## 2015-11-07 ENCOUNTER — Other Ambulatory Visit: Payer: Self-pay | Admitting: Internal Medicine

## 2015-11-07 DIAGNOSIS — I2510 Atherosclerotic heart disease of native coronary artery without angina pectoris: Secondary | ICD-10-CM

## 2015-11-07 DIAGNOSIS — E785 Hyperlipidemia, unspecified: Secondary | ICD-10-CM

## 2015-11-07 MED ORDER — ROSUVASTATIN CALCIUM 5 MG PO TABS: 5.0000 mg | ORAL_TABLET | Freq: Every day | ORAL | 3 refills | Status: DC

## 2015-11-07 NOTE — Progress Notes (Unsigned)
Resent rx to mail order. Pt informed and will pick up short supply at local [facility].

## 2015-11-07 NOTE — Telephone Encounter (Signed)
Informed pt rx has been sent to [facility].   Pt states that she had an intolerance to both and apologized for any confusion.

## 2015-11-07 NOTE — Telephone Encounter (Signed)
RX sent to [facility]

## 2015-11-07 NOTE — Telephone Encounter (Signed)
Pt rq rosuvastatin. Please advise

## 2015-11-13 ENCOUNTER — Telehealth: Payer: Self-pay | Admitting: Internal Medicine

## 2015-11-13 MED ORDER — LANSOPRAZOLE 15 MG PO CPDR: 15.0000 mg | DELAYED_RELEASE_CAPSULE | Freq: Every day | ORAL | 0 refills | Status: DC

## 2015-11-13 NOTE — Telephone Encounter (Signed)
  rx sent to local pharmacy as requested.

## 2015-11-15 DIAGNOSIS — H26491 Other secondary cataract, right eye: Secondary | ICD-10-CM | POA: Diagnosis not present

## 2015-11-21 ENCOUNTER — Ambulatory Visit: Payer: PPO | Admitting: Internal Medicine

## 2015-12-06 DIAGNOSIS — Z01419 Encounter for gynecological examination (general) (routine) without abnormal findings: Secondary | ICD-10-CM | POA: Diagnosis not present

## 2015-12-06 DIAGNOSIS — Z1231 Encounter for screening mammogram for malignant neoplasm of breast: Secondary | ICD-10-CM | POA: Diagnosis not present

## 2015-12-21 ENCOUNTER — Ambulatory Visit (INDEPENDENT_AMBULATORY_CARE_PROVIDER_SITE_OTHER): Payer: PPO | Admitting: Internal Medicine

## 2015-12-21 VITALS — BP 136/76 | HR 68 | Temp 98.9°F | Resp 20 | Wt 157.0 lb

## 2015-12-21 DIAGNOSIS — H1010 Acute atopic conjunctivitis, unspecified eye: Secondary | ICD-10-CM | POA: Insufficient documentation

## 2015-12-21 DIAGNOSIS — I10 Essential (primary) hypertension: Secondary | ICD-10-CM | POA: Diagnosis not present

## 2015-12-21 DIAGNOSIS — H1013 Acute atopic conjunctivitis, bilateral: Secondary | ICD-10-CM | POA: Diagnosis not present

## 2015-12-21 DIAGNOSIS — R739 Hyperglycemia, unspecified: Secondary | ICD-10-CM | POA: Diagnosis not present

## 2015-12-21 DIAGNOSIS — J301 Allergic rhinitis due to pollen: Secondary | ICD-10-CM

## 2015-12-21 MED ORDER — PREDNISONE 10 MG PO TABS: ORAL_TABLET | ORAL | 0 refills | Status: DC

## 2015-12-21 NOTE — Assessment & Plan Note (Signed)
Mild to mod, for depomedrol IM, predpac asd, allegra/nasacort asd,  to f/u any worsening symptoms or concerns

## 2015-12-21 NOTE — Assessment & Plan Note (Signed)
stable overall by history and exam, recent data reviewed with pt, and pt to continue medical treatment as before,  to f/u any worsening symptoms or concerns BP Readings from Last 3 Encounters:  12/21/15 136/76  10/03/15 140/80  09/11/15 120/80

## 2015-12-21 NOTE — Patient Instructions (Signed)
You had the steroid shot today  Please take all new medication as prescribed - the prednisone  OK to continue the allegra, and please also take the OTC Nasacort instead of the flonase  Please continue all other medications as before, and refills have been done if requested.  Please have the pharmacy call with any other refills you may need.  Please keep your appointments with your specialists as you may have planned

## 2015-12-21 NOTE — Progress Notes (Signed)
Pre visit review using our clinic review tool, if applicable. No additional management support is needed unless otherwise documented below in the visit note. 

## 2015-12-21 NOTE — Progress Notes (Signed)
Subjective:    Patient ID: Krystal [name], female    DOB: 03/26/1930, 82 y.o.   MRN: [id number]  HPI  Here for acute visit; overall doing ok,  Pt denies chest pain, increasing sob or doe, wheezing, orthopnea, PND, increased LE swelling, palpitations, dizziness or syncope.  Pt denies new neurological symptoms such as new headache, or facial or extremity weakness or numbness.  Pt denies polydipsia, polyuria, or low sugar episode.   Pt denies new neurological symptoms such as new headache, or facial or extremity weakness or numbness.   Pt states overall good compliance with meds, mostly trying to follow appropriate diet, with wt overall stable,  but little exercise however.  Does have several wks ongoing nasal allergy symptoms with clearish congestion, itch and sneezing, without fever, pain, ST, cough, swelling or wheezing.   Past Medical History:  Diagnosis Date  . Adenomatous colon polyp   . Allergy    seasonal  . Anemia    pt. denies  . Anxiety   . CAD (coronary artery disease)   . Cataract    cataracts removed left eye.  . Chronic lower back pain   . Diverticulosis   . GERD (gastroesophageal reflux disease)   . Headache(784.0)   . Hiatal hernia   . Hyperlipidemia   . Hypertension   . IBS (irritable bowel syndrome) 10/13/2011  . Osteoarthritis   . Osteoporosis   . Renal cysts, acquired, bilateral   . SBO (small bowel obstruction) ([place]) 09/05/2015  . Venous insufficiency    Past Surgical History:  Procedure Laterality Date  . ABDOMINAL HYSTERECTOMY    . BLADDER REPAIR    . CARDIAC CATHETERIZATION  01/12/2002   90% stenosis of the left circumflex, stented with a 3x34mm Cypher stent, resulting in reduction of 90% to 10%   . CARDIOVASCULAR STRESS TEST  03/22/2007   Mild inferolateral thinning toward the apex without significant ischemia. Nondiagnostic electrocardiogram.  . CATARACT EXTRACTION     bilateral  . COLONOSCOPY  08/17/2008   adenomatous polyp, diverticulosis, external  hemorrhoids  . COLONOSCOPY W/ BIOPSIES     multiple   . LEFT LOWER EXTREMITY VENOUS DOPPLER  06/18/2011   No evidence of left lower extremity DVT  . TRANSTHORACIC ECHOCARDIOGRAM  11/18/2012   EF [id number], LV systolic mild-moderately reduced, mild-moderate mitral valve regurg, mild-moderate tricuspid valve regurg. NSR-LBBB with occas PVCs  . UPPER GASTROINTESTINAL ENDOSCOPY  07/03/2010   hiatal hernia  . VARICOSE VEIN SURGERY      reports that she has never smoked. She has never used smokeless tobacco. She reports that she does not drink alcohol or use drugs. family history includes Heart disease in her father; Stroke in her sister. Allergies  Allergen Reactions  . Atorvastatin Other (See Comments)    Muscle aches  . Simvastatin Other (See Comments)    Myalgias   . Alendronate Sodium     REACTION: pt states INTOL to Fosamax w/ esophagitis  . Cefdinir     REACTION: diarrhea  . Esomeprazole Magnesium     REACTION: pt states INTOL to Nexium  . Levofloxacin     REACTION: diarrhea  . Omeprazole     REACTION: pt states INTOL to Prilosec  . Other     Pneumonia vaccine---felt tired and achy and sick x 6 months to get over this.  . Penicillins Hives  . Shellfish-Derived Products Hives, Nausea Only and Rash   Current Outpatient Prescriptions on File Prior to Visit  Medication Sig Dispense Refill  .  acetaminophen (TYLENOL) 500 MG tablet Take 500 mg by mouth every 4 (four) hours as needed for mild pain or headache.    . albuterol (PROVENTIL HFA;VENTOLIN HFA) 108 (90 BASE) MCG/ACT inhaler Inhale 2 puffs into the lungs every 6 (six) hours as needed for wheezing or shortness of breath. 1 Inhaler 1  . amLODipine (NORVASC) 10 MG tablet Take 1 tablet (10 mg total) by mouth daily. 90 tablet 3  . aspirin EC 81 MG tablet Take 81 mg by mouth daily.    . calcium carbonate (OS-CAL) 600 MG TABS tablet Take 600 mg by mouth 2 (two) times daily with a meal.    . cholecalciferol (VITAMIN D) 1000 UNITS tablet  Take 2,000 Units by mouth daily.     . fluticasone (FLONASE) 50 MCG/ACT nasal spray Place 2 sprays into both nostrils daily as needed. (Patient taking differently: Place 2 sprays into both nostrils daily as needed for allergies. ) 16 g 11  . lansoprazole (PREVACID) 15 MG capsule Take 1 capsule (15 mg total) by mouth daily. 30 capsule 0  . LORazepam (ATIVAN) 1 MG tablet Take 0.5 tablets (0.5 mg total) by mouth at bedtime as needed for anxiety. 30 tablet 3  . losartan (COZAAR) 50 MG tablet Take 1 tablet (50 mg total) by mouth daily. 90 tablet 3  . meloxicam (MOBIC) 7.5 MG tablet Take 1 tablet (7.5 mg total) by mouth daily. 90 tablet 1  . metoprolol succinate (TOPROL XL) 25 MG 24 hr tablet Take 1 tablet (25 mg total) by mouth daily. 90 tablet 3  . Probiotic Product (ALIGN) 4 MG CAPS Take 4 mg by mouth daily.     . rosuvastatin (CRESTOR) 5 MG tablet Take 1 tablet (5 mg total) by mouth daily. 90 tablet 3   No current facility-administered medications on file prior to visit.    Review of Systems  Constitutional: Negative for unusual diaphoresis or night sweats HENT: Negative for ear swelling or discharge Eyes: Negative for worsening visual haziness  Respiratory: Negative for choking and stridor.   Gastrointestinal: Negative for distension or worsening eructation Genitourinary: Negative for retention or change in urine volume.  Musculoskeletal: Negative for other MSK pain or swelling Skin: Negative for color change and worsening wound Neurological: Negative for tremors and numbness other than noted  Psychiatric/Behavioral: Negative for decreased concentration or agitation other than above       Objective:   Physical Exam BP 136/76   Pulse 68   Temp 98.9 F (37.2 C) (Oral)   Resp 20   Wt 157 lb (71.2 kg)   SpO2 96%   BMI 26.13 kg/m  VS noted,  Constitutional: Pt appears in no apparent distress HENT: Head: NCAT.  Right Ear: External ear normal.  Left Ear: External ear normal.  Bilat  tm's with mild erythema.  Max sinus areas non tender.  Pharynx with mild erythema, no exudate Eyes: . Pupils are equal, round, and reactive to light. Conjunctivae and EOM are normal Neck: Normal range of motion. Neck supple.  Cardiovascular: Normal rate and regular rhythm.   Pulmonary/Chest: Effort normal and breath sounds without rales or wheezing.  Neurological: Pt is alert. Not confused , motor grossly intact Skin: Skin is warm. No rash, no LE edema Psychiatric: Pt behavior is normal. No agitation.     Assessment & Plan:

## 2015-12-21 NOTE — Assessment & Plan Note (Signed)
stable overall by history and exam, recent data reviewed with pt, and pt to continue medical treatment as before,  to f/u any worsening symptoms or concerns Lab Results  Component Value Date   HGBA1C 5.9 05/23/2015

## 2016-01-17 ENCOUNTER — Encounter: Payer: Self-pay | Admitting: Internal Medicine

## 2016-01-17 ENCOUNTER — Ambulatory Visit (INDEPENDENT_AMBULATORY_CARE_PROVIDER_SITE_OTHER): Payer: PPO | Admitting: Internal Medicine

## 2016-01-17 ENCOUNTER — Other Ambulatory Visit (INDEPENDENT_AMBULATORY_CARE_PROVIDER_SITE_OTHER): Payer: PPO

## 2016-01-17 VITALS — BP 128/78 | HR 70 | Temp 97.7°F | Resp 16 | Ht 65.0 in | Wt 157.2 lb

## 2016-01-17 DIAGNOSIS — R51 Headache: Secondary | ICD-10-CM

## 2016-01-17 DIAGNOSIS — I10 Essential (primary) hypertension: Secondary | ICD-10-CM | POA: Diagnosis not present

## 2016-01-17 DIAGNOSIS — I2510 Atherosclerotic heart disease of native coronary artery without angina pectoris: Secondary | ICD-10-CM

## 2016-01-17 DIAGNOSIS — E785 Hyperlipidemia, unspecified: Secondary | ICD-10-CM

## 2016-01-17 DIAGNOSIS — Z23 Encounter for immunization: Secondary | ICD-10-CM | POA: Diagnosis not present

## 2016-01-17 DIAGNOSIS — H9319 Tinnitus, unspecified ear: Secondary | ICD-10-CM

## 2016-01-17 DIAGNOSIS — R519 Headache, unspecified: Secondary | ICD-10-CM

## 2016-01-17 DIAGNOSIS — R252 Cramp and spasm: Secondary | ICD-10-CM

## 2016-01-17 DIAGNOSIS — H8110 Benign paroxysmal vertigo, unspecified ear: Secondary | ICD-10-CM

## 2016-01-17 LAB — LIPID PANEL
CHOL/HDL RATIO: 3
Cholesterol: 166 mg/dL (ref 0–200)
HDL: 55.5 mg/dL (ref >39.00)
LDL Cholesterol: 91 mg/dL (ref 0–99)
NonHDL: 110.18
TRIGLYCERIDES: 97 mg/dL (ref 0.0–149.0)
VLDL: 19.4 mg/dL (ref 0.0–40.0)

## 2016-01-17 LAB — CBC WITH DIFFERENTIAL/PLATELET
Basophils Absolute: 0 10*3/uL (ref 0.0–0.1)
Basophils Relative: 0 % (ref 0.0–3.0)
EOS PCT: 1.5 % (ref 0.0–5.0)
Eosinophils Absolute: 0.1 10*3/uL (ref 0.0–0.7)
HCT: 44.5 % (ref 36.0–46.0)
Hemoglobin: 14.9 g/dL (ref 12.0–15.0)
LYMPHS ABS: 3.1 10*3/uL (ref 0.7–4.0)
Lymphocytes Relative: 33.5 % (ref 12.0–46.0)
MCHC: 33.5 g/dL (ref 30.0–36.0)
MCV: 94.1 fl (ref 78.0–100.0)
MONOS PCT: 7.3 % (ref 3.0–12.0)
Monocytes Absolute: 0.7 10*3/uL (ref 0.1–1.0)
NEUTROS ABS: 5.4 10*3/uL (ref 1.4–7.7)
NEUTROS PCT: 57.7 % (ref 43.0–77.0)
PLATELETS: 199 10*3/uL (ref 150.0–400.0)
RBC: 4.73 Mil/uL (ref 3.87–5.11)
RDW: 14.8 % (ref 11.5–15.5)
WBC: 9.3 10*3/uL (ref 4.0–10.5)

## 2016-01-17 LAB — BASIC METABOLIC PANEL
BUN: 21 mg/dL (ref 6–23)
CHLORIDE: 104 meq/L (ref 96–112)
CO2: 32 meq/L (ref 19–32)
CREATININE: 0.81 mg/dL (ref 0.40–1.20)
Calcium: 9.5 mg/dL (ref 8.4–10.5)
GFR: 71.6 mL/min (ref >60.00)
Glucose, Bld: 100 mg/dL — ABNORMAL HIGH (ref 70–99)
Potassium: 4.2 mEq/L (ref 3.5–5.1)
Sodium: 141 mEq/L (ref 135–145)

## 2016-01-17 LAB — C-REACTIVE PROTEIN: CRP: 0.2 mg/dL — ABNORMAL LOW (ref 0.5–20.0)

## 2016-01-17 LAB — SEDIMENTATION RATE: SED RATE: 8 mm/h (ref 0–30)

## 2016-01-17 MED ORDER — PREGABALIN 75 MG PO CAPS: 75.0000 mg | ORAL_CAPSULE | Freq: Every day | ORAL | 0 refills | Status: DC

## 2016-01-17 NOTE — Progress Notes (Signed)
Subjective:  Patient ID: Krystal [name], female    DOB: 05/08/1929  Age: 82 y.o. MRN: [id number]  CC: Hypertension; Hyperlipidemia; and Headache   HPI Krystal [name] presents for a 6 week history of worsening headache on the left top of her head that extends into her left eye and her left face. She was seen by another provider about a month ago and it was felt that it might be related to her sinuses so she took a course of steroids which helped significantly. She tells tells me the symptoms are back. She says it hurts when she palpates her left temple. She also describes worsening ringing in both ears and a diffuse sensation of vertigo. She denies changes in her vision but has decreased hearing. She denies paresthesias in her arms or legs and denies ataxia, nausea, vomiting, neck, or back pain. She also complains of cramps in her feet and toes at night. She tells me her toes curl up and are very painful during the night. She has tried multiple over-the-counter remedies such as a tablespoon of mustard at bedtime as well as putting a bar of Ivory soap under her fitted sheet.  Outpatient Medications Prior to Visit  Medication Sig Dispense Refill  . acetaminophen (TYLENOL) 500 MG tablet Take 500 mg by mouth every 4 (four) hours as needed for mild pain or headache.    . albuterol (PROVENTIL HFA;VENTOLIN HFA) 108 (90 BASE) MCG/ACT inhaler Inhale 2 puffs into the lungs every 6 (six) hours as needed for wheezing or shortness of breath. 1 Inhaler 1  . amLODipine (NORVASC) 10 MG tablet Take 1 tablet (10 mg total) by mouth daily. 90 tablet 3  . aspirin EC 81 MG tablet Take 81 mg by mouth daily.    . calcium carbonate (OS-CAL) 600 MG TABS tablet Take 600 mg by mouth 2 (two) times daily with a meal.    . cholecalciferol (VITAMIN D) 1000 UNITS tablet Take 2,000 Units by mouth daily.     . fluticasone (FLONASE) 50 MCG/ACT nasal spray Place 2 sprays into both nostrils daily as needed. (Patient taking differently:  Place 2 sprays into both nostrils daily as needed for allergies. ) 16 g 11  . lansoprazole (PREVACID) 15 MG capsule Take 1 capsule (15 mg total) by mouth daily. 30 capsule 0  . LORazepam (ATIVAN) 1 MG tablet Take 0.5 tablets (0.5 mg total) by mouth at bedtime as needed for anxiety. 30 tablet 3  . losartan (COZAAR) 50 MG tablet Take 1 tablet (50 mg total) by mouth daily. 90 tablet 3  . meloxicam (MOBIC) 7.5 MG tablet Take 1 tablet (7.5 mg total) by mouth daily. 90 tablet 1  . metoprolol succinate (TOPROL XL) 25 MG 24 hr tablet Take 1 tablet (25 mg total) by mouth daily. 90 tablet 3  . Probiotic Product (ALIGN) 4 MG CAPS Take 4 mg by mouth daily.     . rosuvastatin (CRESTOR) 5 MG tablet Take 1 tablet (5 mg total) by mouth daily. 90 tablet 3  . predniSONE (DELTASONE) 10 MG tablet 3 tabs by mouth per day for 3 days,2tabs per day for 3 days,1tab per day for 3 day 18 tablet 0   No facility-administered medications prior to visit.     ROS Review of Systems  Constitutional: Negative for activity change, appetite change, diaphoresis, fatigue and unexpected weight change.  HENT: Positive for hearing loss and tinnitus. Negative for congestion, facial swelling, sinus pressure, trouble swallowing and voice change.  Eyes: Negative.  Negative for photophobia, pain, redness and visual disturbance.  Respiratory: Negative for cough, choking, chest tightness, shortness of breath and stridor.   Cardiovascular: Negative for chest pain, palpitations and leg swelling.  Gastrointestinal: Negative for abdominal pain, constipation, diarrhea, nausea and vomiting.  Endocrine: Negative.   Genitourinary: Negative.  Negative for difficulty urinating.  Musculoskeletal: Negative for arthralgias, back pain, myalgias and neck pain.  Skin: Negative.  Negative for color change and rash.  Allergic/Immunologic: Negative.   Neurological: Positive for dizziness and headaches. Negative for tremors, seizures, syncope, speech  difficulty, weakness, light-headedness and numbness.  Hematological: Negative for adenopathy. Does not bruise/bleed easily.  Psychiatric/Behavioral: Negative.     Objective:  BP 128/78 (BP Location: Left Arm, Patient Position: Sitting, Cuff Size: Normal)   Pulse 70   Temp 97.7 F (36.5 C) (Oral)   Resp 16   Ht 5\' 5"  (1.651 m)   Wt 157 lb 4 oz (71.3 kg)   SpO2 97%   BMI 26.17 kg/m   BP Readings from Last 3 Encounters:  01/17/16 128/78  12/21/15 136/76  10/03/15 140/80    Wt Readings from Last 3 Encounters:  01/17/16 157 lb 4 oz (71.3 kg)  12/21/15 157 lb (71.2 kg)  10/03/15 160 lb (72.6 kg)    Physical Exam  Constitutional: She is oriented to person, place, and time. She appears well-developed and well-nourished. No distress.  HENT:  Right Ear: Hearing, tympanic membrane, external ear and ear canal normal.  Left Ear: Hearing, tympanic membrane, external ear and ear canal normal.  Mouth/Throat: Oropharynx is clear and moist. No oropharyngeal exudate.  There is mild tenderness to palpation diffusely over left temple but there are no pulsatile arteries.  Eyes: Conjunctivae are normal. Right eye exhibits no discharge. Left eye exhibits no discharge. No scleral icterus.  Neck: Normal range of motion. Neck supple. No JVD present. No tracheal deviation present. No thyromegaly present.  Cardiovascular: Normal rate, regular rhythm, normal heart sounds and intact distal pulses.  Exam reveals no gallop and no friction rub.   No murmur heard. Pulmonary/Chest: Effort normal and breath sounds normal. No stridor. No respiratory distress. She has no wheezes. She has no rales. She exhibits no tenderness.  Abdominal: Soft. Bowel sounds are normal. She exhibits no distension and no mass. There is no tenderness. There is no rebound and no guarding.  Musculoskeletal: Normal range of motion. She exhibits no edema, tenderness or deformity.  Lymphadenopathy:    She has no cervical adenopathy.    Neurological: She is alert and oriented to person, place, and time. She has normal reflexes. She displays normal reflexes. No cranial nerve deficit. She exhibits normal muscle tone. Coordination normal.  Skin: Skin is warm and dry. No rash noted. She is not diaphoretic. No erythema. No pallor.  Psychiatric: She has a normal mood and affect. Her behavior is normal. Judgment and thought content normal.  Vitals reviewed.   Lab Results  Component Value Date   WBC 9.3 01/17/2016   HGB 14.9 01/17/2016   HCT 44.5 01/17/2016   PLT 199.0 01/17/2016   GLUCOSE 100 (H) 01/17/2016   CHOL 166 01/17/2016   TRIG 97.0 01/17/2016   HDL 55.50 01/17/2016   LDLDIRECT 160.4 12/19/2008   LDLCALC 91 01/17/2016   ALT 20 09/05/2015   AST 28 09/05/2015   NA 141 01/17/2016   K 4.2 01/17/2016   CL 104 01/17/2016   CREATININE 0.81 01/17/2016   BUN 21 01/17/2016   CO2 32 01/17/2016  TSH 1.56 05/23/2015   HGBA1C 5.9 05/23/2015    Dg Abd 1 View  Result Date: 09/05/2015 CLINICAL DATA:  Nausea, mid low abdomen discomfort EXAM: ABDOMEN - 1 VIEW COMPARISON:  CT abdomen pelvis of 09/05/2015 FINDINGS: There is slight gaseous distention of a few small-bowel loops consistent with persistent small-bowel obstruction. No colonic bowel gas is seen. Contrast is noted filling the urinary bladder. The bones are somewhat osteopenic. IMPRESSION: Suspect persistent partial low-grade small bowel obstruction. Electronically Signed   By: Ivar Drape M.D.   On: 09/05/2015 08:30   Ct Abdomen Pelvis W Contrast  Result Date: 09/05/2015 CLINICAL DATA:  Right lower quadrant pain and spasms. EXAM: CT ABDOMEN AND PELVIS WITH CONTRAST TECHNIQUE: Multidetector CT imaging of the abdomen and pelvis was performed using the standard protocol following bolus administration of intravenous contrast. CONTRAST:  167mL ISOVUE-300 IOPAMIDOL (ISOVUE-300) INJECTION 61% COMPARISON:  08/27/2010 FINDINGS: Lower chest and abdominal wall:  Small sliding hiatal  hernia. Hepatobiliary: No focal liver abnormality.No evidence of biliary obstruction or stone. Pancreas: Unremarkable. Spleen: Unremarkable. Adrenals/Urinary Tract: Negative adrenals. No hydronephrosis or stone. Simple 64 mm right upper pole cyst. Left upper pole renal cortical scarring. Unremarkable bladder. Reproductive:Hysterectomy.  Pelvic floor laxity.  No adnexal mass. Stomach/Bowel: There is dilated and fluid-filled proximal small bowel. Mid small bowel is less dilated but contains fecalized material. The ileum is collapsed. Most of the colon is evacuated and collapsed. No discrete transition point is seen. No evidence of mass. No evidence of bowel necrosis. Negative for appendicitis. Mild distal colonic diverticulosis. Vascular/Lymphatic: Atherosclerosis. No acute vascular finding. No mass or adenopathy. Peritoneal: No ascites or pneumoperitoneum. Musculoskeletal: No acute abnormalities. IMPRESSION: 1. Dilated proximal and collapsed distal small bowel is consistent with obstruction but a discrete transition point is not identified. 2. Otherwise stable exam compared to 2012, as described. Electronically Signed   By: Monte Fantasia M.D.   On: 09/05/2015 02:33    Assessment & Plan:   Amiah was seen today for hypertension, hyperlipidemia and headache.  Diagnoses and all orders for this visit:  Need for prophylactic vaccination and inoculation against influenza -     Flu vaccine HIGH DOSE PF (Fluzone High dose)  Atherosclerosis of native coronary artery of native heart without angina pectoris -     Lipid panel; Future  Essential hypertension- her blood pressure is well-controlled, electrolytes and renal function are stable. -     Basic metabolic panel; Future  Hyperlipidemia with target LDL less than 70- She is doing well on the statin. -     Lipid panel; Future  Acute intractable headache, unspecified headache type- MRI is ordered to screen for mass, CVA. She responded to steroids so was  concerned she might have temporal arteritis but her sedimentation rate and C-reactive protein are normal so though it is a complicated picture having been previously treated with steroids I'm not very suspicious that she has temporal arteritis. Also, her CBC is normal and there is usually an anemia with temporal arteritis. -     Sedimentation rate; Future -     C-reactive protein; Future -     CBC with Differential/Platelet; Future -     MR BRAIN WO CONTRAST; Future  Benign paroxysmal positional vertigo, unspecified laterality- I've ordered an MRI to see if she has had a CVA or to see if she is developing acousticneuromas or other CNS tumors. -     MR BRAIN WO CONTRAST; Future  Tinnitus, unspecified laterality- I've asked her to see  audiology to be screened for hearing loss. -     Ambulatory referral to Audiology -     [place]; Future   I have discontinued Ms. Leinberger's predniSONE. I am also having her start on pregabalin. Additionally, I am having her maintain her ALIGN, aspirin EC, albuterol, cholecalciferol, calcium carbonate, acetaminophen, LORazepam, fluticasone, meloxicam, amLODipine, losartan, metoprolol succinate, rosuvastatin, and lansoprazole.  Meds ordered this encounter  Medications  . pregabalin (LYRICA) 75 MG capsule    Sig: Take 1 capsule (75 mg total) by mouth at bedtime.    Dispense:  56 capsule    Refill:  0     Follow-up: Return in about 4 weeks (around 02/14/2016).  Scarlette Calico, MD

## 2016-01-17 NOTE — Progress Notes (Signed)
Pre visit review using our clinic review tool, if applicable. No additional management support is needed unless otherwise documented below in the visit note. 

## 2016-01-17 NOTE — Patient Instructions (Signed)

## 2016-01-19 ENCOUNTER — Encounter: Payer: Self-pay | Admitting: Internal Medicine

## 2016-01-28 ENCOUNTER — Encounter: Payer: Self-pay | Admitting: Internal Medicine

## 2016-01-31 ENCOUNTER — Telehealth: Payer: Self-pay | Admitting: Emergency Medicine

## 2016-01-31 NOTE — Telephone Encounter (Signed)
Pt called an stated her and her husband are both sick so she had to cancel her MRI. She will talk to you later about rescheduling it. Thanks.

## 2016-02-01 ENCOUNTER — Other Ambulatory Visit: Payer: PPO

## 2016-02-01 NOTE — Telephone Encounter (Signed)
noted 

## 2016-02-07 ENCOUNTER — Telehealth: Payer: Self-pay | Admitting: Internal Medicine

## 2016-02-07 NOTE — Telephone Encounter (Signed)
Please advise at your convenience 

## 2016-02-07 NOTE — Telephone Encounter (Signed)
Pt called in and said that she still coughing and wants to know if Dr Ronnald Ramp will call in some cough meds for her  Pharmacy [facility] [address].

## 2016-02-08 NOTE — Telephone Encounter (Signed)
Will you call pt to come in for an appt please

## 2016-02-08 NOTE — Telephone Encounter (Signed)
Called pt to schedule OV for cough. Pt refused OV for today and stated she would call back next week if she isnt feeling any btter. Thanks.

## 2016-02-11 ENCOUNTER — Ambulatory Visit: Payer: PPO | Admitting: Family

## 2016-02-25 ENCOUNTER — Encounter: Payer: Self-pay | Admitting: *Deleted

## 2016-02-26 ENCOUNTER — Encounter: Payer: Self-pay | Admitting: Cardiovascular Disease

## 2016-02-26 ENCOUNTER — Ambulatory Visit (INDEPENDENT_AMBULATORY_CARE_PROVIDER_SITE_OTHER): Payer: PPO | Admitting: Cardiovascular Disease

## 2016-02-26 VITALS — BP 176/91 | HR 72 | Ht 65.0 in | Wt 159.0 lb

## 2016-02-26 DIAGNOSIS — I2510 Atherosclerotic heart disease of native coronary artery without angina pectoris: Secondary | ICD-10-CM

## 2016-02-26 DIAGNOSIS — E785 Hyperlipidemia, unspecified: Secondary | ICD-10-CM

## 2016-02-26 DIAGNOSIS — I10 Essential (primary) hypertension: Secondary | ICD-10-CM

## 2016-02-26 MED ORDER — ROSUVASTATIN CALCIUM 10 MG PO TABS: 10.0000 mg | ORAL_TABLET | Freq: Every day | ORAL | 1 refills | Status: DC

## 2016-02-26 NOTE — Progress Notes (Signed)
02/26/2016 [facility]   08-18-1929  [id number]  Primary Physician Scarlette Calico, MD Primary Cardiologist: Lorretta Harp MD Krystal [name]  HPI:  Krystal [name] is a 82 year old mildly overweight married Caucasian female mother of 2 children, grandmother of 3 grandchildren, whose husband Krystal [name] is also a patient of mine. They were both patients of Dr. Georgiann Mccoy. I last saw her in the office 02/20/15. Her past history is remarkable for treated hypertension and hyperlipidemia. Her father did die of a myocardial infarction. She has never had a heart attack or stroke. She does not smoke. She had a Cypher drug-eluting stent placed in her circumflex coronary artery by Dr. Eustace Quail in 2003. Her other arteries were normal as was her LV function. She denies chest pain or shortness of breath. She is very active and walks for an hour every morning.     Current Outpatient Prescriptions  Medication Sig Dispense Refill  . acetaminophen (TYLENOL) 500 MG tablet Take 500 mg by mouth every 4 (four) hours as needed for mild pain or headache.    . albuterol (PROVENTIL HFA;VENTOLIN HFA) 108 (90 BASE) MCG/ACT inhaler Inhale 2 puffs into the lungs every 6 (six) hours as needed for wheezing or shortness of breath. 1 Inhaler 1  . amLODipine (NORVASC) 10 MG tablet Take 1 tablet (10 mg total) by mouth daily. 90 tablet 3  . aspirin EC 81 MG tablet Take 81 mg by mouth daily.    . calcium carbonate (OS-CAL) 600 MG TABS tablet Take 600 mg by mouth 2 (two) times daily with a meal.    . cholecalciferol (VITAMIN D) 1000 UNITS tablet Take 2,000 Units by mouth daily.     . fluticasone (FLONASE) 50 MCG/ACT nasal spray Place 2 sprays into both nostrils daily as needed. (Patient taking differently: Place 2 sprays into both nostrils daily as needed for allergies. ) 16 g 11  . lansoprazole (PREVACID) 15 MG capsule Take 1 capsule (15 mg total) by mouth daily. 30 capsule 0  . LORazepam (ATIVAN) 1 MG tablet  Take 0.5 tablets (0.5 mg total) by mouth at bedtime as needed for anxiety. 30 tablet 3  . losartan (COZAAR) 50 MG tablet Take 1 tablet (50 mg total) by mouth daily. 90 tablet 3  . meloxicam (MOBIC) 7.5 MG tablet Take 1 tablet (7.5 mg total) by mouth daily. 90 tablet 1  . metoprolol succinate (TOPROL XL) 25 MG 24 hr tablet Take 1 tablet (25 mg total) by mouth daily. 90 tablet 3  . pregabalin (LYRICA) 75 MG capsule Take 1 capsule (75 mg total) by mouth at bedtime. 56 capsule 0  . Probiotic Product (ALIGN) 4 MG CAPS Take 4 mg by mouth daily.     . rosuvastatin (CRESTOR) 10 MG tablet Take 1 tablet (10 mg total) by mouth daily. 90 tablet 1   No current facility-administered medications for this visit.     Allergies  Allergen Reactions  . Atorvastatin Other (See Comments)    Muscle aches  . Simvastatin Other (See Comments)    Myalgias   . Alendronate Sodium     REACTION: pt states INTOL to Fosamax w/ esophagitis  . Cefdinir     REACTION: diarrhea  . Esomeprazole Magnesium     REACTION: pt states INTOL to Nexium  . Levofloxacin     REACTION: diarrhea  . Omeprazole     REACTION: pt states INTOL to Prilosec  . Other     Pneumonia vaccine---felt  tired and achy and sick x 6 months to get over this.  . Penicillins Hives  . Shellfish-Derived Products Hives, Nausea Only and Rash    Social History   Social History  . Marital status: Married    Spouse name: Krystal [name]  . Number of children: N/A  . Years of education: N/A   Occupational History  . retired    Social History Main Topics  . Smoking status: Never Smoker  . Smokeless tobacco: Never Used  . Alcohol use No  . Drug use: No  . Sexual activity: Not on file   Other Topics Concern  . Not on file   Social History Narrative  . No narrative on file     Review of Systems: General: negative for chills, fever, night sweats or weight changes.  Cardiovascular: negative for chest pain, dyspnea on exertion, edema, orthopnea,  palpitations, paroxysmal nocturnal dyspnea or shortness of breath Dermatological: negative for rash Respiratory: negative for cough or wheezing Urologic: negative for hematuria Abdominal: negative for nausea, vomiting, diarrhea, bright red blood per rectum, melena, or hematemesis Neurologic: negative for visual changes, syncope, or dizziness All other systems reviewed and are otherwise negative except as noted above.    Blood pressure (!) 176/91, pulse 72, height 5\' 5"  (1.651 m), weight 159 lb (72.1 kg).  General appearance: alert and no distress Neck: no adenopathy, no carotid bruit, no JVD, supple, symmetrical, trachea midline and thyroid not enlarged, symmetric, no tenderness/mass/nodules Lungs: clear to auscultation bilaterally Heart: regular rate and rhythm, S1, S2 normal, no murmur, click, rub or gallop Extremities: extremities normal, atraumatic, no cyanosis or edema  EKG normal sinus rhythm at 72 with left bundle branch block unchanged from prior EKGs. I personally reviewed this EKG  ASSESSMENT AND PLAN:   Hyperlipidemia with target LDL less than 70 History of hyperlipidemia on Zocor in the past changed to low-dose Crestor by her PCP. Her LDL as result went up from 75-91. I'm going to increase her Crestor from 5-10 mg a day and will recheck a lipid and liver profile in 2 months.  Essential hypertension History of hypertension with blood pressure measured today in the office at 176/91. She says at home which she measures it it's usually 134/79. She is on amlodipine and metoprolol and losartan. Continue current meds are current dose  Coronary atherosclerosis History of coronary artery disease status post stenting of her circumflex artery with a Cypher drug-eluting stent by Dr. Maurene Capes in 2003. Her other arteries were normal as was her LV function. She denies chest pain or shortness of breath.      Lorretta Harp MD FACP,FACC,FAHA, [facility] 02/26/2016 8:48 AM

## 2016-02-26 NOTE — Assessment & Plan Note (Signed)
History of hyperlipidemia on Zocor in the past changed to low-dose Crestor by her PCP. Her LDL as result went up from 75-91. I'm going to increase her Crestor from 5-10 mg a day and will recheck a lipid and liver profile in 2 months.

## 2016-02-26 NOTE — Assessment & Plan Note (Signed)
History of coronary artery disease status post stenting of her circumflex artery with a Cypher drug-eluting stent by Dr. Maurene Capes in 2003. Her other arteries were normal as was her LV function. She denies chest pain or shortness of breath.

## 2016-02-26 NOTE — Patient Instructions (Signed)
Medication Instructions: Increase Crestor (Rosuvastatin) to 10 mg daily.   Labwork: Your physician recommends that you return for a FASTING lipid profile and hepatic function panel in 2 months.   Follow-Up: Your physician wants you to follow-up in: 1 year with Dr. Gwenlyn Found. You will receive a reminder letter in the mail two months in advance. If you don't receive a letter, please call our office to schedule the follow-up appointment.  If you need a refill on your cardiac medications before your next appointment, please call your pharmacy.

## 2016-02-26 NOTE — Assessment & Plan Note (Signed)
History of hypertension with blood pressure measured today in the office at 176/91. She says at home which she measures it it's usually 134/79. She is on amlodipine and metoprolol and losartan. Continue current meds are current dose

## 2016-04-02 ENCOUNTER — Other Ambulatory Visit: Payer: Self-pay | Admitting: Internal Medicine

## 2016-04-02 ENCOUNTER — Telehealth: Payer: Self-pay | Admitting: *Deleted

## 2016-04-02 DIAGNOSIS — F411 Generalized anxiety disorder: Secondary | ICD-10-CM

## 2016-04-02 MED ORDER — LORAZEPAM 1 MG PO TABS: 0.5000 mg | ORAL_TABLET | Freq: Every evening | ORAL | 3 refills | Status: DC | PRN

## 2016-04-02 NOTE — Telephone Encounter (Signed)
RX written 

## 2016-04-02 NOTE — Telephone Encounter (Signed)
Pt left msg on triage needing to get a refill on Lorazepam [facility] mg [facility]...Krystal [name]

## 2016-04-02 NOTE — Telephone Encounter (Signed)
[facility]  

## 2016-04-24 DIAGNOSIS — E785 Hyperlipidemia, unspecified: Secondary | ICD-10-CM | POA: Diagnosis not present

## 2016-04-24 DIAGNOSIS — I2510 Atherosclerotic heart disease of native coronary artery without angina pectoris: Secondary | ICD-10-CM | POA: Diagnosis not present

## 2016-04-24 LAB — HEPATIC FUNCTION PANEL
ALBUMIN: 4 g/dL (ref 3.6–5.1)
ALT: 16 U/L (ref 6–29)
AST: 21 U/L (ref 10–35)
Alkaline Phosphatase: 95 U/L (ref 33–130)
BILIRUBIN DIRECT: 0.1 mg/dL (ref <=0.2)
BILIRUBIN TOTAL: 0.5 mg/dL (ref 0.2–1.2)
Indirect Bilirubin: 0.4 mg/dL (ref 0.2–1.2)
Total Protein: 6.4 g/dL (ref 6.1–8.1)

## 2016-04-24 LAB — LIPID PANEL
CHOL/HDL RATIO: 3.2 ratio (ref <5.0)
CHOLESTEROL: 141 mg/dL (ref <200)
HDL: 44 mg/dL — AB (ref >50)
LDL Cholesterol: 71 mg/dL (ref <100)
Triglycerides: 129 mg/dL (ref <150)
VLDL: 26 mg/dL (ref <30)

## 2016-04-28 ENCOUNTER — Telehealth: Payer: Self-pay | Admitting: Internal Medicine

## 2016-04-28 MED ORDER — LANSOPRAZOLE 15 MG PO CPDR: 15.0000 mg | DELAYED_RELEASE_CAPSULE | Freq: Two times a day (BID) | ORAL | 3 refills | Status: DC

## 2016-04-28 NOTE — Telephone Encounter (Signed)
Spoke with Stanton Kidney and told her we are sending in the lansoprazole BID as requested.

## 2016-04-28 NOTE — Telephone Encounter (Signed)
Is it ok with you Sir for her to use the lansoprazole BID?

## 2016-04-28 NOTE — Telephone Encounter (Signed)
Ok if she needs Rx refill x 1 year

## 2016-05-15 ENCOUNTER — Telehealth: Payer: Self-pay | Admitting: Internal Medicine

## 2016-05-16 ENCOUNTER — Ambulatory Visit (INDEPENDENT_AMBULATORY_CARE_PROVIDER_SITE_OTHER): Payer: PPO | Admitting: Internal Medicine

## 2016-05-16 ENCOUNTER — Encounter: Payer: Self-pay | Admitting: Internal Medicine

## 2016-05-16 DIAGNOSIS — B9789 Other viral agents as the cause of diseases classified elsewhere: Secondary | ICD-10-CM | POA: Diagnosis not present

## 2016-05-16 DIAGNOSIS — J069 Acute upper respiratory infection, unspecified: Secondary | ICD-10-CM | POA: Diagnosis not present

## 2016-05-16 MED ORDER — ALBUTEROL SULFATE HFA 108 (90 BASE) MCG/ACT IN AERS: 2.0000 | INHALATION_SPRAY | Freq: Four times a day (QID) | RESPIRATORY_TRACT | 0 refills | Status: DC | PRN

## 2016-05-16 NOTE — Patient Instructions (Addendum)
You can try claritin or zyrtec over the counter for the sinuses.   We have sent in the albuterol inhaler in case you need it.

## 2016-05-16 NOTE — Progress Notes (Signed)
Pre visit review using our clinic review tool, if applicable. No additional management support is needed unless otherwise documented below in the visit note. 

## 2016-05-16 NOTE — Progress Notes (Signed)
   Subjective:    Patient ID: Krystal [name], female    DOB: [birth year], 83 y.o.   MRN: [id number]  HPI The patient is an 83 YO female coming in for cough for about 1 week. Started with some mild body aches and chills. No fevers at home. She does have some SOB with exertion. Cough is present and dry at this time. Some sinus drainage as well. She is using flonase and saline spray routinely. She does have problems with allergies a lot in the past and has taken allergy shots in the distant past. She is having mild pressure in her sinuses.   Review of Systems  Constitutional: Positive for activity change and fatigue. Negative for appetite change, chills, fever and unexpected weight change.  HENT: Positive for congestion, postnasal drip and rhinorrhea. Negative for ear discharge, ear pain, sinus pain, sinus pressure, sore throat and trouble swallowing.   Eyes: Negative.   Respiratory: Positive for cough and shortness of breath. Negative for chest tightness and wheezing.   Cardiovascular: Negative.   Gastrointestinal: Negative.   Musculoskeletal: Negative.   Neurological: Negative.       Objective:   Physical Exam  Constitutional: She is oriented to person, place, and time. She appears well-developed and well-nourished.  HENT:  Head: Normocephalic and atraumatic.  Right Ear: External ear normal.  Left Ear: External ear normal.  Oropharynx with redness and clear drainage, nose without crusting, no sinus pressure.   Eyes: EOM are normal.  Neck: Normal range of motion.  Cardiovascular: Normal rate and regular rhythm.   Pulmonary/Chest: Effort normal. No respiratory distress. She has no wheezes. She has no rales.  Abdominal: Soft. Bowel sounds are normal.  Musculoskeletal: She exhibits no edema.  Neurological: She is alert and oriented to person, place, and time.  Skin: Skin is warm and dry.   Vitals:   05/16/16 1340  BP: (!) 142/78  Pulse: 88  Temp: 98.3 F (36.8 C)  TempSrc: Oral    SpO2: 98%  Weight: 158 lb (71.7 kg)  Height: 5\' 5"  (1.651 m)      Assessment & Plan:

## 2016-05-17 DIAGNOSIS — J069 Acute upper respiratory infection, unspecified: Secondary | ICD-10-CM | POA: Insufficient documentation

## 2016-05-17 DIAGNOSIS — B9789 Other viral agents as the cause of diseases classified elsewhere: Principal | ICD-10-CM

## 2016-05-17 NOTE — Assessment & Plan Note (Signed)
Rx for albuterol inhaler for SOB but no indication for antibiotics or steroids. She will start taking zyrtec over the counter. Reassured that this is a viral illness and the typical course.

## 2016-05-22 ENCOUNTER — Ambulatory Visit (INDEPENDENT_AMBULATORY_CARE_PROVIDER_SITE_OTHER): Payer: PPO | Admitting: Orthopedic Surgery

## 2016-05-22 ENCOUNTER — Encounter (INDEPENDENT_AMBULATORY_CARE_PROVIDER_SITE_OTHER): Payer: Self-pay | Admitting: Orthopedic Surgery

## 2016-05-22 ENCOUNTER — Ambulatory Visit (INDEPENDENT_AMBULATORY_CARE_PROVIDER_SITE_OTHER): Payer: PPO

## 2016-05-22 DIAGNOSIS — M542 Cervicalgia: Secondary | ICD-10-CM | POA: Insufficient documentation

## 2016-05-23 ENCOUNTER — Telehealth (INDEPENDENT_AMBULATORY_CARE_PROVIDER_SITE_OTHER): Payer: Self-pay | Admitting: Orthopedic Surgery

## 2016-05-23 NOTE — Telephone Encounter (Signed)
Pt [facility]  [phone]

## 2016-05-25 NOTE — Progress Notes (Signed)
Office Visit Note   Patient: Krystal [name]           Date of Birth: July 06, 1929           MRN: [id number] Visit Date: 05/22/2016 Requested by: Krystal Lima, MD [address] [place], [place] [address] PCP: Krystal Calico, MD  Subjective: Chief Complaint  Patient presents with  . Neck - Pain    HPI Krystal [name] is an 83 year old right-hand-dominant female with neck and left shoulder pain as well as some numbness and tingling in the right hand but not in the left.  This is been going on for 3-4 months intermittently.  Symptoms are worse with activity.  She reports left-sided neck pain which radiates into the left shoulder and then moves into the head causing headaches.  She is to work with findings where she would probably order shoulder while she was talking.  She thinks this may contribute to her symptoms.  She feels like the left shoulder is okay but does report left-sided neck pain radiating down.  She has a cyst on her kidney and cannot take nonsteroidals.  She also has a heart stent.  The pain on the left-hand side is localizing more in the trapezial region by her report.  It's been hurting her to lift for 3 months.  She denies any numbness and tingling in that left arm.  She is mostly concerned that she doesn't want to get worse.  She does use heat and Tylenol.  She states that this is something she can live with but if it gets worse and she would want to proceed with workup and possible intervention which at this point would potentially be an injection in her neck.              Review of Systems All systems reviewed are negative as they relate to the chief complaint within the history of present illness.  Patient denies  fevers or chills.    Assessment & Plan: Visit Diagnoses:  1. Cervicalgia     Plan: Impression is early radiculopathy from the neck with some diminished range of motion to the left-hand side and trapezial radiation.  This is not real focal tenderness in the  trapezial region such as I would see with trigger point but seems to be more radiating from the neck.  Radiographs pretty unremarkable.  I would try her with some Pennsaid topical samples.  I'll make her insurance will really cover that prescription but we will try her with the samples.  Scan will be next intervention if her symptoms worsen.  She will call me to arrange for that.  In regards to the right hand that patient may have a component of carpal tunnel syndrome but that can be worked up later if she desires.  Currently is not symptomatic enough that she wants to proceed with workup.  Follow-Up Instructions: No Follow-up on file.   Orders:  Orders Placed This Encounter  Procedures  . XR Cervical Spine 2 or 3 views   No orders of the defined types were placed in this encounter.     Procedures: No procedures performed   Clinical Data: No additional findings.  Objective: Vital Signs: There were no vitals taken for this visit.  Physical Exam   Constitutional: Patient appears well-developed HEENT:  Head: Normocephalic Eyes:EOM are normal Neck: Normal range of motion Cardiovascular: Normal rate Pulmonary/chest: Effort normal Neurologic: Patient is alert Skin: Skin is warm Psychiatric: Patient has normal mood  and affect    Ortho Exam examination of the neck does show diminished rotation to the left but not to the right.  Flexion-extension generally intact.  Patient has 5 out of 5 grip EPL FPL interosseous wrist flexion-extension by the triceps and deltoid strength.  She does have some fingertip paresthesias on the right but not left.  Negative Tinel's cubital tunnel bilaterally with no subluxation of the ulnar nerve on either elbow.  Wrist elbow shoulder range of motion full.  No paresthesias C5 T1.  She does have some trapezial muscle tenderness and spasm but it's no real focal trigger point area on the left-hand side.  No scapular dyskinesia with forward flexion.  No evidence  of frozen shoulder.  Specialty Comments:  No specialty comments available.  Imaging: No results found.   PMFS History: Patient Active Problem List   Diagnosis Date Noted  . Cervicalgia 05/22/2016  . Viral URI with cough 05/17/2016  . Acute intractable headache 01/17/2016  . Benign paroxysmal positional vertigo 01/17/2016  . Tinnitus 01/17/2016  . Nocturnal foot cramps 01/17/2016  . Hyperglycemia 05/23/2015  . IBS (irritable bowel syndrome) 10/13/2011  . GERD (gastroesophageal reflux disease) 06/25/2010  . RENAL CYST 11/18/2007  . OSTEOPOROSIS 11/18/2007  . Allergic rhinitis 10/06/2007  . Hyperlipidemia with target LDL less than 70 07/19/2007  . GAD (generalized anxiety disorder) 07/19/2007  . Essential hypertension 07/19/2007  . Coronary atherosclerosis 07/19/2007  . VENOUS INSUFFICIENCY 07/19/2007  . Osteoarthritis 07/19/2007  . LOW BACK PAIN SYNDROME 07/19/2007   Past Medical History:  Diagnosis Date  . Adenomatous colon polyp   . Allergy    seasonal  . Anemia    pt. denies  . Anxiety   . CAD (coronary artery disease)   . Cataract    cataracts removed left eye.  . Chronic lower back pain   . Diverticulosis   . GERD (gastroesophageal reflux disease)   . Headache(784.0)   . Hiatal hernia   . Hyperlipidemia   . Hypertension   . IBS (irritable bowel syndrome) 10/13/2011  . Osteoarthritis   . Osteoporosis   . Renal cysts, acquired, bilateral   . SBO (small bowel obstruction) 09/05/2015  . Venous insufficiency     Family History  Problem Relation Age of Onset  . Heart disease Father   . Stroke Sister   . Colon cancer Neg Hx     Past Surgical History:  Procedure Laterality Date  . ABDOMINAL HYSTERECTOMY    . BLADDER REPAIR    . CARDIAC CATHETERIZATION  01/12/2002   90% stenosis of the left circumflex, stented with a 3x76mm Cypher stent, resulting in reduction of 90% to 10%   . CARDIOVASCULAR STRESS TEST  03/22/2007   Mild inferolateral thinning toward the  apex without significant ischemia. Nondiagnostic electrocardiogram.  . CATARACT EXTRACTION     bilateral  . COLONOSCOPY  08/17/2008   adenomatous polyp, diverticulosis, external hemorrhoids  . COLONOSCOPY W/ BIOPSIES     multiple   . LEFT LOWER EXTREMITY VENOUS DOPPLER  06/18/2011   No evidence of left lower extremity DVT  . TRANSTHORACIC ECHOCARDIOGRAM  11/18/2012   EF [id number], LV systolic mild-moderately reduced, mild-moderate mitral valve regurg, mild-moderate tricuspid valve regurg. NSR-LBBB with occas PVCs  . UPPER GASTROINTESTINAL ENDOSCOPY  07/03/2010   hiatal hernia  . VARICOSE VEIN SURGERY     Social History   Occupational History  . retired    Social History Main Topics  . Smoking status: Never Smoker  . Smokeless tobacco:  Never Used  . Alcohol use No  . Drug use: No  . Sexual activity: Not on file

## 2016-05-27 ENCOUNTER — Telehealth (INDEPENDENT_AMBULATORY_CARE_PROVIDER_SITE_OTHER): Payer: Self-pay | Admitting: *Deleted

## 2016-05-27 NOTE — Telephone Encounter (Signed)
Patient called again this afternoon regarding getting some Pennsaid prescribed please to [facility] on [address] please. Her CB # [phone]. Thank you

## 2016-05-28 MED ORDER — DICLOFENAC SODIUM 1 % TD GEL: 4.0000 g | Freq: Four times a day (QID) | TRANSDERMAL | 2 refills | Status: DC

## 2016-05-28 NOTE — Telephone Encounter (Signed)
Per Dr Marlou Sa try Voltaren gel generic, insurance will not cover pennsaid.  Rx sent to pharm, and IC pt and advised.

## 2016-05-28 NOTE — Telephone Encounter (Signed)
IC pt discussed, another Rx sent generic to her pharm

## 2016-07-07 ENCOUNTER — Telehealth: Payer: Self-pay | Admitting: Cardiovascular Disease

## 2016-07-07 NOTE — Telephone Encounter (Signed)
Spoke with patient and she has had some swelling in her ankles that does go down at night. This has been going on for the last couple of months. She does watch her salt intake. Denies any shortness of breath. Blood pressure has been running in the 130's/80's Scheduled office visit with Doreene Burke PA for 07/16/16, patient aware of date and time. Patient wanted for Dr Gwenlyn Found to be aware and give any recommendations if he had any before her visit. Did advise Dr Gwenlyn Found out of the office this week but would forward for review

## 2016-07-07 NOTE — Telephone Encounter (Signed)
Patient calling states that she has been experiencing some swelling in both of her ankles after long periods of sitting. Patient also states that her BP has been in the 80's. Patient would like to know if Dr. Gwenlyn [name] could change her medication to a "light fluid' medication. Please call to discuss,thanks.

## 2016-07-09 NOTE — Telephone Encounter (Signed)
Advised patient

## 2016-07-09 NOTE — Telephone Encounter (Signed)
No recommendations prior to visit with [facility].

## 2016-07-16 ENCOUNTER — Encounter: Payer: Self-pay | Admitting: Cardiology

## 2016-07-16 ENCOUNTER — Ambulatory Visit (INDEPENDENT_AMBULATORY_CARE_PROVIDER_SITE_OTHER): Payer: PPO | Admitting: Cardiology

## 2016-07-16 VITALS — BP 160/84 | HR 72 | Ht 65.0 in | Wt 156.0 lb

## 2016-07-16 DIAGNOSIS — M25472 Effusion, left ankle: Secondary | ICD-10-CM | POA: Diagnosis not present

## 2016-07-16 DIAGNOSIS — Z9861 Coronary angioplasty status: Secondary | ICD-10-CM

## 2016-07-16 DIAGNOSIS — I429 Cardiomyopathy, unspecified: Secondary | ICD-10-CM | POA: Insufficient documentation

## 2016-07-16 DIAGNOSIS — E785 Hyperlipidemia, unspecified: Secondary | ICD-10-CM | POA: Diagnosis not present

## 2016-07-16 DIAGNOSIS — I255 Ischemic cardiomyopathy: Secondary | ICD-10-CM

## 2016-07-16 DIAGNOSIS — M25471 Effusion, right ankle: Secondary | ICD-10-CM

## 2016-07-16 DIAGNOSIS — I2510 Atherosclerotic heart disease of native coronary artery without angina pectoris: Secondary | ICD-10-CM

## 2016-07-16 DIAGNOSIS — I10 Essential (primary) hypertension: Secondary | ICD-10-CM

## 2016-07-16 DIAGNOSIS — Z79899 Other long term (current) drug therapy: Secondary | ICD-10-CM | POA: Diagnosis not present

## 2016-07-16 DIAGNOSIS — I447 Left bundle-branch block, unspecified: Secondary | ICD-10-CM | POA: Insufficient documentation

## 2016-07-16 MED ORDER — HYDROCHLOROTHIAZIDE 25 MG PO TABS: 12.5000 mg | ORAL_TABLET | Freq: Every day | ORAL | 1 refills | Status: DC

## 2016-07-16 NOTE — Progress Notes (Signed)
07/16/2016 Krystal [name]   1929/06/27  [id number]  Primary Physician Scarlette Calico, MD Primary Cardiologist: Dr Gwenlyn Found  HPI:  Pleasant 83 y/o female followed by Dr Gwenlyn Found with a history of CAD, s/p remote CFX PCI with DES in 2003. She had a Myoview in 2008 that was normal. An echo in 2014 showed her EF to be 40-45%. She has HTN, HLD, and chronic LBBB. She is in the office today with complaints of LE edema and labile B/P.   The pt states she notices LE edema, usually later in the day, resolves overnight. She denies any orthopnea or unusual DOE. She walks 45 minutes a day without chest pian of SOB and has done this for years. She denies excess sodium intake and her wgt has been stable. She also tells me her B/P has been running high at times- systolic up to 782.   Current Outpatient Prescriptions  Medication Sig Dispense Refill  . acetaminophen (TYLENOL) 500 MG tablet Take 500 mg by mouth every 4 (four) hours as needed for mild pain or headache.    . albuterol (PROVENTIL HFA;VENTOLIN HFA) 108 (90 Base) MCG/ACT inhaler Inhale 2 puffs into the lungs every 6 (six) hours as needed for wheezing or shortness of breath. 1 Inhaler 0  . amLODipine (NORVASC) 10 MG tablet Take 1 tablet (10 mg total) by mouth daily. 90 tablet 3  . aspirin EC 81 MG tablet Take 81 mg by mouth daily.    . calcium carbonate (OS-CAL) 600 MG TABS tablet Take 600 mg by mouth 2 (two) times daily with a meal.    . cholecalciferol (VITAMIN D) 1000 UNITS tablet Take 2,000 Units by mouth daily.     . diclofenac sodium (VOLTAREN) 1 % GEL Apply 4 g topically 4 (four) times daily. 2 Tube 2  . fluticasone (FLONASE) 50 MCG/ACT nasal spray Place 2 sprays into both nostrils daily as needed. (Patient taking differently: Place 2 sprays into both nostrils daily as needed for allergies. ) 16 g 11  . lansoprazole (PREVACID) 15 MG capsule Take 1 capsule (15 mg total) by mouth 2 (two) times daily before a meal. 180 capsule 3  . LORazepam (ATIVAN) 1  MG tablet Take 0.5 tablets (0.5 mg total) by mouth at bedtime as needed for anxiety. 30 tablet 3  . losartan (COZAAR) 50 MG tablet Take 1 tablet (50 mg total) by mouth daily. 90 tablet 3  . meloxicam (MOBIC) 7.5 MG tablet Take 1 tablet (7.5 mg total) by mouth daily. 90 tablet 1  . metoprolol succinate (TOPROL XL) 25 MG 24 hr tablet Take 1 tablet (25 mg total) by mouth daily. 90 tablet 3  . pregabalin (LYRICA) 75 MG capsule Take 1 capsule (75 mg total) by mouth at bedtime. 56 capsule 0  . Probiotic Product (ALIGN) 4 MG CAPS Take 4 mg by mouth daily.     . rosuvastatin (CRESTOR) 10 MG tablet Take 1 tablet (10 mg total) by mouth daily. 90 tablet 1  . hydrochlorothiazide (HYDRODIURIL) 25 MG tablet Take 0.5 tablets (12.5 mg total) by mouth daily. 45 tablet 1   No current facility-administered medications for this visit.     Allergies  Allergen Reactions  . Atorvastatin Other (See Comments)    Muscle aches  . Simvastatin Other (See Comments)    Myalgias   . Alendronate Sodium     REACTION: pt states INTOL to Fosamax w/ esophagitis  . Cefdinir     REACTION: diarrhea  . Esomeprazole  Magnesium     REACTION: pt states INTOL to Nexium  . Levofloxacin     REACTION: diarrhea  . Omeprazole     REACTION: pt states INTOL to Prilosec  . Other     Pneumonia vaccine---felt tired and achy and sick x 6 months to get over this.  . Penicillins Hives  . Shellfish-Derived Products Hives, Nausea Only and Rash    Past Medical History:  Diagnosis Date  . Adenomatous colon polyp   . Allergy    seasonal  . Anemia    pt. denies  . Anxiety   . CAD (coronary artery disease)   . Cataract    cataracts removed left eye.  . Chronic lower back pain   . Diverticulosis   . GERD (gastroesophageal reflux disease)   . Headache(784.0)   . Hiatal hernia   . Hyperlipidemia   . Hypertension   . IBS (irritable bowel syndrome) 10/13/2011  . Osteoarthritis   . Osteoporosis   . Renal cysts, acquired, bilateral     . SBO (small bowel obstruction) ([place]) 09/05/2015  . Venous insufficiency     Social History   Social History  . Marital status: Married    Spouse name: [name]  . Number of children: N/A  . Years of education: N/A   Occupational History  . retired    Social History Main Topics  . Smoking status: Never Smoker  . Smokeless tobacco: Never Used  . Alcohol use No  . Drug use: No  . Sexual activity: Not on file   Other Topics Concern  . Not on file   Social History Narrative  . No narrative on file     Family History  Problem Relation Age of Onset  . Heart disease Father   . Stroke Sister   . Colon cancer Neg Hx      Review of Systems: General: negative for chills, fever, night sweats or weight changes.  Cardiovascular: negative for chest pain, dyspnea on exertion, orthopnea, palpitations, paroxysmal nocturnal dyspnea or shortness of breath Dermatological: negative for rash Respiratory: negative for cough or wheezing Urologic: negative for hematuria Abdominal: negative for nausea, vomiting, diarrhea, bright red blood per rectum, melena, or hematemesis Neurologic: negative for visual changes, syncope, or dizziness All other systems reviewed and are otherwise negative except as noted above.    Blood pressure (!) 160/84, pulse 72, height 5\' 5"  (1.651 m), weight 156 lb (70.8 kg), SpO2 98 %.  General appearance: alert, cooperative and no distress Neck: no carotid bruit and no JVD Lungs: clear to auscultation bilaterally Heart: regular rate and rhythm Extremities: trace edema, bilateral varicosities Pulses: 2+ and symmetric Skin: Skin color, texture, turgor normal. No rashes or lesions Neurologic: Grossly normal   ASSESSMENT AND PLAN:   Edema of both ankles I suspect this is from mild venous insufficieny. She has varicosities on exam. She has had prior vein stripping. Norvasc 10 mg may also be contribute but I am hesitant to decrease this now.  Cardiomyopathy  ([place]) EF 40-45% by echo Aug 2014 No symptoms of CHF  CAD S/P percutaneous coronary angioplasty CFX PCI in '03, low risk Nuc in '08 No angina  Essential hypertension Labile B/P at home  Hyperlipidemia with target LDL less than 70 LDL 71 Jan 2018  LBBB (left bundle branch block) Chronic   PLAN  I added HCTZ 12.5mg  MWF. She'll log her B/P at home and f/u with Dr Gwenlyn Found in 4-6 weeks (BMP before then).   Kerin Ransom  PA-C 07/16/2016 8:28 AM

## 2016-07-16 NOTE — Assessment & Plan Note (Signed)
EF 40-45% by echo Aug 2014 No symptoms of CHF

## 2016-07-16 NOTE — Assessment & Plan Note (Signed)
I suspect this is from mild venous insufficieny. She has varicosities on exam. Norvasc 10 mg may also be contribute but I am hesitant to decrease this now.

## 2016-07-16 NOTE — Assessment & Plan Note (Signed)
CFX PCI in '03, low risk Nuc in '08 No angina

## 2016-07-16 NOTE — Assessment & Plan Note (Signed)
Labile B/P at home

## 2016-07-16 NOTE — Patient Instructions (Signed)
Medication Instructions:  START HYDROCHLOROTHIAZIDE (HCTZ) 12.5 MG (1/2 TABLET) DAILY  If you need a refill on your cardiac medications before your next appointment, please call your pharmacy.  Labwork: BMP AT [facility] ON THE 1ST FLOOR- BEFORE YOUR NEXT APPOINTMENT  Follow-Up: Your physician wants you to follow-up in: 4-6 WEEKS WITH DR Gwenlyn Found   Special Instructions: CHECK BP DAILY-AT DIFFERENT TIME DURING THE WEEK AND BRING LOG TO NEXT APPOINTMENT    Thank you for choosing [facility] at [facility]!!

## 2016-07-16 NOTE — Assessment & Plan Note (Signed)
LDL 71 Jan 2018

## 2016-07-16 NOTE — Assessment & Plan Note (Signed)
Chronic. 

## 2016-07-29 ENCOUNTER — Ambulatory Visit: Payer: PPO | Admitting: Internal Medicine

## 2016-08-20 DIAGNOSIS — Z79899 Other long term (current) drug therapy: Secondary | ICD-10-CM | POA: Diagnosis not present

## 2016-08-20 LAB — BASIC METABOLIC PANEL
BUN: 16 mg/dL (ref 7–25)
CO2: 28 mmol/L (ref 20–31)
Calcium: 9.1 mg/dL (ref 8.6–10.4)
Chloride: 107 mmol/L (ref 98–110)
Creat: 0.81 mg/dL (ref 0.60–0.88)
Glucose, Bld: 97 mg/dL (ref 65–99)
Potassium: 4.2 mmol/L (ref 3.5–5.3)
Sodium: 143 mmol/L (ref 135–146)

## 2016-08-27 ENCOUNTER — Ambulatory Visit (INDEPENDENT_AMBULATORY_CARE_PROVIDER_SITE_OTHER): Payer: PPO | Admitting: Cardiovascular Disease

## 2016-08-27 ENCOUNTER — Encounter: Payer: Self-pay | Admitting: Cardiovascular Disease

## 2016-08-27 VITALS — BP 132/68 | HR 65 | Ht 65.0 in | Wt 156.0 lb

## 2016-08-27 DIAGNOSIS — Z9861 Coronary angioplasty status: Secondary | ICD-10-CM

## 2016-08-27 DIAGNOSIS — M25472 Effusion, left ankle: Secondary | ICD-10-CM

## 2016-08-27 DIAGNOSIS — M25471 Effusion, right ankle: Secondary | ICD-10-CM | POA: Diagnosis not present

## 2016-08-27 DIAGNOSIS — I10 Essential (primary) hypertension: Secondary | ICD-10-CM | POA: Diagnosis not present

## 2016-08-27 DIAGNOSIS — E785 Hyperlipidemia, unspecified: Secondary | ICD-10-CM | POA: Diagnosis not present

## 2016-08-27 DIAGNOSIS — I447 Left bundle-branch block, unspecified: Secondary | ICD-10-CM

## 2016-08-27 DIAGNOSIS — I2510 Atherosclerotic heart disease of native coronary artery without angina pectoris: Secondary | ICD-10-CM | POA: Diagnosis not present

## 2016-08-27 MED ORDER — ROSUVASTATIN CALCIUM 10 MG PO TABS: 10.0000 mg | ORAL_TABLET | Freq: Every day | ORAL | 3 refills | Status: DC

## 2016-08-27 NOTE — Assessment & Plan Note (Signed)
History of essential hypertension blood pressure measured 132/68. She is on amlodipine, metoprolol , losartan and hydrochlorothiazide. He hydrochlorothiazide was started last month by Kerin Ransom because of hypertension and lower extremity edema which has resolved. Continue current meds at current dosing

## 2016-08-27 NOTE — Assessment & Plan Note (Signed)
History of CAD status post circumflex intervention with a Cypher drug-eluting stent by Dr. Eustace Quail in 2003. Her other arteries were normal and she had normal LV function She lowers Myoview in 2008. She denies chest pain or shortness of breath.

## 2016-08-27 NOTE — Assessment & Plan Note (Signed)
Resolved at the addition of low-dose Hytrin for thiazide.  follow-up blood work was unremarkable.

## 2016-08-27 NOTE — Assessment & Plan Note (Signed)
History of hyperlipidemia on statin therapy with recent lipid profile performed 04/24/16 revealing total cholesterol 141, LDL 71 and HDL of 44.

## 2016-08-27 NOTE — Assessment & Plan Note (Signed)
Chronic left bundle branch block. 

## 2016-08-27 NOTE — Progress Notes (Signed)
08/27/2016 Krystal [name]   [birth year]  [id number]  Primary Physician Krystal Lima, MD Primary Cardiologist: Krystal Harp MD Krystal [name]  HPI:  Krystal [name] is a 83 year old mildly overweight married Caucasian female mother of 2 children, grandmother of 3 grandchildren, whose husband Krystal [name] is also a patient of mine. They were both patients of Krystal [name]. I last saw her in the office 02/26/16. Her past history is remarkable for treated hypertension and hyperlipidemia. Her father did die of a myocardial infarction. She has never had a heart attack or stroke. She does not smoke. She had a Cypher drug-eluting stent placed in her circumflex coronary artery by Krystal [name] in 2003. Her other arteries were normal as was her LV function. She denies chest pain or shortness of breath. She is very active and walks for an hour every morning. She saw Krystal [name] Krystal [name] back in the office 07/16/16 with elevated blood pressures and lower extremity/ankle edema. Low-dose Hydrochlorothiazide was added which resulted in improvement in her edema and blood pressure.   Current Outpatient Prescriptions  Medication Sig Dispense Refill  . acetaminophen (TYLENOL) 500 MG tablet Take 500 mg by mouth every 4 (four) hours as needed for mild pain or headache.    . albuterol (PROVENTIL HFA;VENTOLIN HFA) 108 (90 Base) MCG/ACT inhaler Inhale 2 puffs into the lungs every 6 (six) hours as needed for wheezing or shortness of breath. 1 Inhaler 0  . amLODipine (NORVASC) 10 MG tablet Take 1 tablet (10 mg total) by mouth daily. 90 tablet 3  . aspirin EC 81 MG tablet Take 81 mg by mouth daily.    . calcium carbonate (OS-CAL) 600 MG TABS tablet Take 600 mg by mouth 2 (two) times daily with a meal.    . cholecalciferol (VITAMIN D) 1000 UNITS tablet Take 2,000 Units by mouth daily.     . diclofenac sodium (VOLTAREN) 1 % GEL Apply 4 g topically 4 (four) times daily. 2 Tube 2  . fluticasone (FLONASE) 50  MCG/ACT nasal spray Place 2 sprays into both nostrils daily as needed. (Patient taking differently: Place 2 sprays into both nostrils daily as needed for allergies. ) 16 g 11  . hydrochlorothiazide (HYDRODIURIL) 25 MG tablet Take 0.5 tablets (12.5 mg total) by mouth daily. 45 tablet 1  . lansoprazole (PREVACID) 15 MG capsule Take 1 capsule (15 mg total) by mouth 2 (two) times daily before a meal. 180 capsule 3  . LORazepam (ATIVAN) 1 MG tablet Take 0.5 tablets (0.5 mg total) by mouth at bedtime as needed for anxiety. 30 tablet 3  . losartan (COZAAR) 50 MG tablet Take 1 tablet (50 mg total) by mouth daily. 90 tablet 3  . meloxicam (MOBIC) 7.5 MG tablet Take 1 tablet (7.5 mg total) by mouth daily. 90 tablet 1  . metoprolol succinate (TOPROL XL) 25 MG 24 hr tablet Take 1 tablet (25 mg total) by mouth daily. 90 tablet 3  . pregabalin (LYRICA) 75 MG capsule Take 1 capsule (75 mg total) by mouth at bedtime. 56 capsule 0  . Probiotic Product (ALIGN) 4 MG CAPS Take 4 mg by mouth daily.     . rosuvastatin (CRESTOR) 10 MG tablet Take 1 tablet (10 mg total) by mouth daily. 90 tablet 3   No current facility-administered medications for this visit.     Allergies  Allergen Reactions  . Atorvastatin Other (See Comments)    Muscle aches  . Simvastatin Other (See Comments)  Myalgias   . Alendronate Sodium     REACTION: pt states INTOL to Fosamax w/ esophagitis  . Cefdinir     REACTION: diarrhea  . Esomeprazole Magnesium     REACTION: pt states INTOL to Nexium  . Levofloxacin     REACTION: diarrhea  . Omeprazole     REACTION: pt states INTOL to Prilosec  . Other     Pneumonia vaccine---felt tired and achy and sick x 6 months to get over this.  . Penicillins Hives  . Shellfish-Derived Products Hives, Nausea Only and Rash    Social History   Social History  . Marital status: Married    Spouse name: Krystal [name]  . Number of children: N/A  . Years of education: N/A   Occupational History  .  retired    Social History Main Topics  . Smoking status: Never Smoker  . Smokeless tobacco: Never Used  . Alcohol use No  . Drug use: No  . Sexual activity: Not on file   Other Topics Concern  . Not on file   Social History Narrative  . No narrative on file     Review of Systems: General: negative for chills, fever, night sweats or weight changes.  Cardiovascular: negative for chest pain, dyspnea on exertion, edema, orthopnea, palpitations, paroxysmal nocturnal dyspnea or shortness of breath Dermatological: negative for rash Respiratory: negative for cough or wheezing Urologic: negative for hematuria Abdominal: negative for nausea, vomiting, diarrhea, bright red blood per rectum, melena, or hematemesis Neurologic: negative for visual changes, syncope, or dizziness All other systems reviewed and are otherwise negative except as noted above.    Blood pressure 132/68, pulse 65, height 5\' 5"  (1.651 m), weight 156 lb (70.8 kg).  General appearance: alert and no distress Neck: no adenopathy, no carotid bruit, no JVD, supple, symmetrical, trachea midline and thyroid not enlarged, symmetric, no tenderness/mass/nodules Lungs: clear to auscultation bilaterally Heart: regular rate and rhythm, S1, S2 normal, no murmur, click, rub or gallop Extremities: extremities normal, atraumatic, no cyanosis or edema  EKG Normal sinus rhythm at 65 with a bundle branch block. I personally reviewed this EKG.  ASSESSMENT AND PLAN:   Hyperlipidemia with target LDL less than 70 History of hyperlipidemia on statin therapy with recent lipid profile performed 04/24/16 revealing total cholesterol 141, LDL 71 and HDL of 44.  Essential hypertension History of essential hypertension blood pressure measured 132/68. She is on amlodipine, metoprolol , losartan and hydrochlorothiazide. He hydrochlorothiazide was started last month by Krystal [name] because of hypertension and lower extremity edema which has resolved.  Continue current meds at current dosing  CAD S/P percutaneous coronary angioplasty History of CAD status post circumflex intervention with a Cypher drug-eluting stent by Krystal [name] in 2003. Her other arteries were normal and she had normal LV function She lowers Myoview in 2008. She denies chest pain or shortness of breath.  LBBB (left bundle branch block) Chronic left bundle-branch block  Edema of both ankles Resolved at the addition of low-dose Hytrin for thiazide.  follow-up blood work was unremarkable.      Krystal Harp MD FACP,FACC,FAHA, [facility] 08/27/2016 10:47 AM

## 2016-08-27 NOTE — Patient Instructions (Signed)
Medication Instructions: Your physician recommends that you continue on your current medications as directed. Please refer to the Current Medication list given to you today.   Follow-Up: We request that you follow-up in: 6 months with Luke Kilroy, PA and in 12 months with Dr Berry  You will receive a reminder letter in the mail two months in advance. If you don't receive a letter, please call our office to schedule the follow-up appointment.  If you need a refill on your cardiac medications before your next appointment, please call your pharmacy.  

## 2016-09-03 ENCOUNTER — Encounter: Payer: Self-pay | Admitting: Cardiovascular Disease

## 2016-09-17 ENCOUNTER — Ambulatory Visit (INDEPENDENT_AMBULATORY_CARE_PROVIDER_SITE_OTHER): Payer: PPO | Admitting: Internal Medicine

## 2016-09-17 ENCOUNTER — Ambulatory Visit (INDEPENDENT_AMBULATORY_CARE_PROVIDER_SITE_OTHER)
Admission: RE | Admit: 2016-09-17 | Discharge: 2016-09-17 | Disposition: A | Discharge status: Home / Self Care | Payer: PPO | Source: Ambulatory Visit | Attending: Internal Medicine | Admitting: Internal Medicine

## 2016-09-17 ENCOUNTER — Encounter: Payer: Self-pay | Admitting: Internal Medicine

## 2016-09-17 VITALS — BP 130/74 | HR 70 | Temp 98.3°F | Resp 16 | Ht 65.0 in | Wt 155.2 lb

## 2016-09-17 DIAGNOSIS — R05 Cough: Secondary | ICD-10-CM | POA: Diagnosis not present

## 2016-09-17 DIAGNOSIS — R059 Cough, unspecified: Secondary | ICD-10-CM

## 2016-09-17 DIAGNOSIS — J301 Allergic rhinitis due to pollen: Secondary | ICD-10-CM | POA: Diagnosis not present

## 2016-09-17 DIAGNOSIS — J988 Other specified respiratory disorders: Secondary | ICD-10-CM

## 2016-09-17 LAB — POCT EXHALED NITRIC OXIDE: FENO LEVEL (PPB): 17

## 2016-09-17 MED ORDER — METHYLPREDNISOLONE 4 MG PO TBPK: ORAL_TABLET | ORAL | 0 refills | Status: DC

## 2016-09-17 MED ORDER — CEFDINIR 300 MG PO CAPS: 300.0000 mg | ORAL_CAPSULE | Freq: Two times a day (BID) | ORAL | 0 refills | Status: AC

## 2016-09-17 NOTE — Progress Notes (Signed)
Subjective:  Patient ID: [name], female    DOB: 28-Aug-1929  Age: 83 y.o. MRN: [id number]  CC: Cough   HPI TAKYRA CANTRALL presents for A one-month history of cough that's productive of thick, yellow mucus with a few episodes of wheezing. She also complains of facial pain, nasal congestion, runny nose, and postnasal drip. She denies fever, chills, night sweats, hemoptysis, or weight loss.  Outpatient Medications Prior to Visit  Medication Sig Dispense Refill  . acetaminophen (TYLENOL) 500 MG tablet Take 500 mg by mouth every 4 (four) hours as needed for mild pain or headache.    . albuterol (PROVENTIL HFA;VENTOLIN HFA) 108 (90 Base) MCG/ACT inhaler Inhale 2 puffs into the lungs every 6 (six) hours as needed for wheezing or shortness of breath. 1 Inhaler 0  . amLODipine (NORVASC) 10 MG tablet Take 1 tablet (10 mg total) by mouth daily. 90 tablet 3  . aspirin EC 81 MG tablet Take 81 mg by mouth daily.    . calcium carbonate (OS-CAL) 600 MG TABS tablet Take 600 mg by mouth 2 (two) times daily with a meal.    . cholecalciferol (VITAMIN D) 1000 UNITS tablet Take 2,000 Units by mouth daily.     . diclofenac sodium (VOLTAREN) 1 % GEL Apply 4 g topically 4 (four) times daily. 2 Tube 2  . fluticasone (FLONASE) 50 MCG/ACT nasal spray Place 2 sprays into both nostrils daily as needed. (Patient taking differently: Place 2 sprays into both nostrils daily as needed for allergies. ) 16 g 11  . hydrochlorothiazide (HYDRODIURIL) 25 MG tablet Take 0.5 tablets (12.5 mg total) by mouth daily. 45 tablet 1  . lansoprazole (PREVACID) 15 MG capsule Take 1 capsule (15 mg total) by mouth 2 (two) times daily before a meal. 180 capsule 3  . LORazepam (ATIVAN) 1 MG tablet Take 0.5 tablets (0.5 mg total) by mouth at bedtime as needed for anxiety. 30 tablet 3  . losartan (COZAAR) 50 MG tablet Take 1 tablet (50 mg total) by mouth daily. 90 tablet 3  . meloxicam (MOBIC) 7.5 MG tablet Take 1 tablet (7.5 mg total) by  mouth daily. 90 tablet 1  . metoprolol succinate (TOPROL XL) 25 MG 24 hr tablet Take 1 tablet (25 mg total) by mouth daily. 90 tablet 3  . pregabalin (LYRICA) 75 MG capsule Take 1 capsule (75 mg total) by mouth at bedtime. 56 capsule 0  . Probiotic Product (ALIGN) 4 MG CAPS Take 4 mg by mouth daily.     . rosuvastatin (CRESTOR) 10 MG tablet Take 1 tablet (10 mg total) by mouth daily. 90 tablet 3   No facility-administered medications prior to visit.     ROS Review of Systems  Constitutional: Negative.  Negative for chills, diaphoresis, fatigue and fever.  HENT: Positive for congestion, postnasal drip, rhinorrhea, sinus pain and sinus pressure. Negative for facial swelling, nosebleeds, sneezing, sore throat, trouble swallowing and voice change.   Eyes: Negative.   Respiratory: Positive for cough and wheezing. Negative for choking, chest tightness, shortness of breath and stridor.   Cardiovascular: Negative.  Negative for chest pain, palpitations and leg swelling.  Gastrointestinal: Negative for abdominal pain, constipation, diarrhea, nausea and vomiting.  Endocrine: Negative.   Genitourinary: Negative.   Musculoskeletal: Negative.  Negative for back pain.  Skin: Negative.  Negative for rash.  Allergic/Immunologic: Negative.   Neurological: Negative.  Negative for dizziness.  Hematological: Negative.  Negative for adenopathy. Does not bruise/bleed easily.  Psychiatric/Behavioral: Negative.  Objective:  BP 130/74 (BP Location: Left Arm, Patient Position: Sitting, Cuff Size: Normal)   Pulse 70   Temp 98.3 F (36.8 C) (Oral)   Resp 16   Ht 5\' 5"  (1.651 m)   Wt 155 lb 4 oz (70.4 kg)   SpO2 100%   BMI 25.83 kg/m   BP Readings from Last 3 Encounters:  09/17/16 130/74  08/27/16 132/68  07/16/16 (!) 160/84    Wt Readings from Last 3 Encounters:  09/17/16 155 lb 4 oz (70.4 kg)  08/27/16 156 lb (70.8 kg)  07/16/16 156 lb (70.8 kg)    Physical Exam  Constitutional: No  distress.  HENT:  Nose: Mucosal edema and rhinorrhea present. No sinus tenderness. Right sinus exhibits no maxillary sinus tenderness and no frontal sinus tenderness. Left sinus exhibits no maxillary sinus tenderness and no frontal sinus tenderness.  Mouth/Throat: Oropharynx is clear and moist and mucous membranes are normal. Mucous membranes are not pale, not dry and not cyanotic. No oral lesions. No trismus in the jaw. No uvula swelling. No oropharyngeal exudate, posterior oropharyngeal edema, posterior oropharyngeal erythema or tonsillar abscesses.  Eyes: Conjunctivae are normal. Right eye exhibits no discharge. Left eye exhibits no discharge. No scleral icterus.  Neck: Normal range of motion. Neck supple. No JVD present. No thyromegaly present.  Cardiovascular: Normal rate, regular rhythm and intact distal pulses.  Exam reveals no gallop.   No murmur heard. Pulmonary/Chest: Effort normal. She has no wheezes. She has rhonchi in the right lower field and the left lower field. She has no rales.  There are expiratory rhonchi in both bases that clear with cough and she has good air movement.  Abdominal: Soft. Bowel sounds are normal. She exhibits no distension and no mass. There is no tenderness. There is no rebound and no guarding.  Lymphadenopathy:    She has no cervical adenopathy.  Skin: She is not diaphoretic.  Vitals reviewed.   Lab Results  Component Value Date   WBC 9.3 01/17/2016   HGB 14.9 01/17/2016   HCT 44.5 01/17/2016   PLT 199.0 01/17/2016   GLUCOSE 97 08/20/2016   CHOL 141 04/24/2016   TRIG 129 04/24/2016   HDL 44 (L) 04/24/2016   LDLDIRECT 160.4 12/19/2008   LDLCALC 71 04/24/2016   ALT 16 04/24/2016   AST 21 04/24/2016   NA 143 08/20/2016   K 4.2 08/20/2016   CL 107 08/20/2016   CREATININE 0.81 08/20/2016   BUN 16 08/20/2016   CO2 28 08/20/2016   TSH 1.56 05/23/2015   HGBA1C 5.9 05/23/2015    Dg Abd 1 View  Result Date: 09/05/2015 CLINICAL DATA:  Nausea, mid  low abdomen discomfort EXAM: ABDOMEN - 1 VIEW COMPARISON:  CT abdomen pelvis of 09/05/2015 FINDINGS: There is slight gaseous distention of a few small-bowel loops consistent with persistent small-bowel obstruction. No colonic bowel gas is seen. Contrast is noted filling the urinary bladder. The bones are somewhat osteopenic. IMPRESSION: Suspect persistent partial low-grade small bowel obstruction. Electronically Signed   By: Ivar Drape M.D.   On: 09/05/2015 08:30   Ct Abdomen Pelvis W Contrast  Result Date: 09/05/2015 CLINICAL DATA:  Right lower quadrant pain and spasms. EXAM: CT ABDOMEN AND PELVIS WITH CONTRAST TECHNIQUE: Multidetector CT imaging of the abdomen and pelvis was performed using the standard protocol following bolus administration of intravenous contrast. CONTRAST:  175mL ISOVUE-300 IOPAMIDOL (ISOVUE-300) INJECTION 61% COMPARISON:  08/27/2010 FINDINGS: Lower chest and abdominal wall:  Small sliding hiatal hernia. Hepatobiliary: No  focal liver abnormality.No evidence of biliary obstruction or stone. Pancreas: Unremarkable. Spleen: Unremarkable. Adrenals/Urinary Tract: Negative adrenals. No hydronephrosis or stone. Simple 64 mm right upper pole cyst. Left upper pole renal cortical scarring. Unremarkable bladder. Reproductive:Hysterectomy.  Pelvic floor laxity.  No adnexal mass. Stomach/Bowel: There is dilated and fluid-filled proximal small bowel. Mid small bowel is less dilated but contains fecalized material. The ileum is collapsed. Most of the colon is evacuated and collapsed. No discrete transition point is seen. No evidence of mass. No evidence of bowel necrosis. Negative for appendicitis. Mild distal colonic diverticulosis. Vascular/Lymphatic: Atherosclerosis. No acute vascular finding. No mass or adenopathy. Peritoneal: No ascites or pneumoperitoneum. Musculoskeletal: No acute abnormalities. IMPRESSION: 1. Dilated proximal and collapsed distal small bowel is consistent with obstruction but a  discrete transition point is not identified. 2. Otherwise stable exam compared to 2012, as described. Electronically Signed   By: Monte Fantasia M.D.   On: 09/05/2015 02:33   Dg Chest 2 View  Result Date: 09/17/2016 CLINICAL DATA:  Cough and congestion EXAM: CHEST  2 VIEW COMPARISON:  September 14, 2014 FINDINGS: There is no edema or consolidation. Heart size and pulmonary vascularity are within normal limits. No adenopathy. There is aortic atherosclerosis. There is anterior wedging of several mid thoracic vertebral bodies, stable. IMPRESSION: No edema or consolidation. Electronically Signed   By: Lowella Grip III M.D.   On: 09/17/2016 10:03     Assessment & Plan:   Audery was seen today for cough.  Diagnoses and all orders for this visit:  Cough- her chest x-ray is negative, she has a slightly elevated FeNO score so will treat with systemic steroids. -     DG Chest 2 View; Future -     POCT EXHALED NITRIC OXIDE  Seasonal allergic rhinitis due to pollen- she is having a moderately severe flare of her symptoms so will treat with a course of systemic steroids. -     methylPREDNISolone (MEDROL DOSEPAK) 4 MG TBPK tablet; TAKE AS DIRECTED  RTI (respiratory tract infection)- I will treat the infection with Omnicef. -     cefdinir (OMNICEF) 300 MG capsule; Take 1 capsule (300 mg total) by mouth 2 (two) times daily.   I am having Ms. Sand start on methylPREDNISolone and cefdinir. I am also having her maintain her ALIGN, aspirin EC, cholecalciferol, calcium carbonate, acetaminophen, fluticasone, meloxicam, amLODipine, losartan, metoprolol succinate, pregabalin, LORazepam, lansoprazole, albuterol, diclofenac sodium, hydrochlorothiazide, and rosuvastatin.  Meds ordered this encounter  Medications  . methylPREDNISolone (MEDROL DOSEPAK) 4 MG TBPK tablet    Sig: TAKE AS DIRECTED    Dispense:  21 tablet    Refill:  0  . cefdinir (OMNICEF) 300 MG capsule    Sig: Take 1 capsule (300 mg total) by  mouth 2 (two) times daily.    Dispense:  20 capsule    Refill:  0     Follow-up: Return in about 3 weeks (around 10/08/2016).  Scarlette Calico, MD

## 2016-09-17 NOTE — Patient Instructions (Signed)
Cough, Adult Coughing is a reflex that clears your throat and your airways. Coughing helps to heal and protect your lungs. It is normal to cough occasionally, but a cough that happens with other symptoms or lasts a long time may be a sign of a condition that needs treatment. A cough may last only 2-3 weeks (acute), or it may last longer than 8 weeks (chronic). What are the causes? Coughing is commonly caused by:  Breathing in substances that irritate your lungs.  A viral or bacterial respiratory infection.  Allergies.  Asthma.  Postnasal drip.  Smoking.  Acid backing up from the stomach into the esophagus (gastroesophageal reflux).  Certain medicines.  Chronic lung problems, including COPD (or rarely, lung cancer).  Other medical conditions such as heart failure.  Follow these instructions at home: Pay attention to any changes in your symptoms. Take these actions to help with your discomfort:  Take medicines only as told by your health care provider. ? If you were prescribed an antibiotic medicine, take it as told by your health care provider. Do not stop taking the antibiotic even if you start to feel better. ? Talk with your health care provider before you take a cough suppressant medicine.  Drink enough fluid to keep your urine clear or pale yellow.  If the air is dry, use a cold steam vaporizer or humidifier in your bedroom or your home to help loosen secretions.  Avoid anything that causes you to cough at work or at home.  If your cough is worse at night, try sleeping in a semi-upright position.  Avoid cigarette smoke. If you smoke, quit smoking. If you need help quitting, ask your health care provider.  Avoid caffeine.  Avoid alcohol.  Rest as needed.  Contact a health care provider if:  You have new symptoms.  You cough up pus.  Your cough does not get better after 2-3 weeks, or your cough gets worse.  You cannot control your cough with suppressant  medicines and you are losing sleep.  You develop pain that is getting worse or pain that is not controlled with pain medicines.  You have a fever.  You have unexplained weight loss.  You have night sweats. Get help right away if:  You cough up blood.  You have difficulty breathing.  Your heartbeat is very fast. This information is not intended to replace advice given to you by your health care provider. Make sure you discuss any questions you have with your health care provider. Document Released: 09/13/2010 Document Revised: 08/23/2015 Document Reviewed: 05/24/2014 Elsevier Interactive Patient Education  2017 [employer]  

## 2016-09-29 ENCOUNTER — Telehealth: Payer: Self-pay

## 2016-09-29 NOTE — Telephone Encounter (Signed)
Routing to dr Sullivan Lone is 2.9 (bone density exam 2016)---are you ok if patient starts prolia---please advise, I will call patient to discuss, thanks

## 2016-09-29 NOTE — Telephone Encounter (Signed)
Patients insurance will be verified, I will call patient to discuss prolia after summary of benefits comes back

## 2016-09-29 NOTE — Telephone Encounter (Signed)
yes

## 2016-10-07 ENCOUNTER — Telehealth: Payer: Self-pay | Admitting: Internal Medicine

## 2016-10-07 MED ORDER — AMLODIPINE BESYLATE 10 MG PO TABS: 10.0000 mg | ORAL_TABLET | Freq: Every day | ORAL | 3 refills | Status: DC

## 2016-10-07 MED ORDER — METOPROLOL SUCCINATE ER 25 MG PO TB24: 25.0000 mg | ORAL_TABLET | Freq: Every day | ORAL | 3 refills | Status: DC

## 2016-10-07 MED ORDER — LOSARTAN POTASSIUM 50 MG PO TABS: 50.0000 mg | ORAL_TABLET | Freq: Every day | ORAL | 3 refills | Status: DC

## 2016-10-07 NOTE — Telephone Encounter (Signed)
erx sent as requested. Sent to mail order first but then resent to local.

## 2016-10-07 NOTE — Telephone Encounter (Signed)
Pt needs a refill of  amLODipine (NORVASC) 10 MG tablet metoprolol succinate (TOPROL XL) 25 MG 24 hr tablet  losartan (COZAAR) 50 MG tablet    [facility] on [address]

## 2016-10-22 ENCOUNTER — Ambulatory Visit: Payer: PPO

## 2016-10-23 ENCOUNTER — Telehealth: Payer: Self-pay | Admitting: General Practice

## 2016-10-23 NOTE — Telephone Encounter (Signed)
I spoke with patient about Prolia.  Patient says that she is not interested at this time due to the @220 .00 co-pay.  I did inform her that there is a co-pay assist program available but she was not interested in pursuing that.  I informed patient that her insurance benefits are good through the end of the year and that if she changes her mind to please call the office.  Patient verbalized understanding.

## 2016-10-30 ENCOUNTER — Other Ambulatory Visit: Payer: Self-pay | Admitting: Cardiovascular Disease

## 2016-10-30 DIAGNOSIS — E785 Hyperlipidemia, unspecified: Secondary | ICD-10-CM

## 2016-10-30 DIAGNOSIS — I2510 Atherosclerotic heart disease of native coronary artery without angina pectoris: Secondary | ICD-10-CM

## 2016-11-11 ENCOUNTER — Other Ambulatory Visit: Payer: Self-pay | Admitting: Internal Medicine

## 2016-11-11 DIAGNOSIS — M159 Polyosteoarthritis, unspecified: Secondary | ICD-10-CM

## 2016-11-11 DIAGNOSIS — M545 Low back pain: Principal | ICD-10-CM

## 2016-11-11 DIAGNOSIS — M150 Primary generalized (osteo)arthritis: Secondary | ICD-10-CM

## 2016-11-11 DIAGNOSIS — G8929 Other chronic pain: Secondary | ICD-10-CM

## 2016-11-11 DIAGNOSIS — J301 Allergic rhinitis due to pollen: Secondary | ICD-10-CM

## 2016-11-17 ENCOUNTER — Encounter: Payer: Self-pay | Admitting: Internal Medicine

## 2016-11-17 ENCOUNTER — Ambulatory Visit (INDEPENDENT_AMBULATORY_CARE_PROVIDER_SITE_OTHER): Payer: PPO | Admitting: Internal Medicine

## 2016-11-17 VITALS — BP 132/78 | HR 72 | Temp 98.0°F | Resp 16 | Ht 65.0 in | Wt 154.5 lb

## 2016-11-17 DIAGNOSIS — J301 Allergic rhinitis due to pollen: Secondary | ICD-10-CM

## 2016-11-17 DIAGNOSIS — J988 Other specified respiratory disorders: Secondary | ICD-10-CM | POA: Diagnosis not present

## 2016-11-17 MED ORDER — FLUTICASONE PROPIONATE 50 MCG/ACT NA SUSP: 2.0000 | Freq: Every day | NASAL | 5 refills | Status: DC | PRN

## 2016-11-17 MED ORDER — HYDROCODONE-HOMATROPINE 5-1.5 MG/5ML PO SYRP: 5.0000 mL | ORAL_SOLUTION | Freq: Three times a day (TID) | ORAL | 0 refills | Status: DC | PRN

## 2016-11-17 MED ORDER — METHYLPREDNISOLONE 4 MG PO TBPK: ORAL_TABLET | ORAL | 0 refills | Status: DC

## 2016-11-17 NOTE — Progress Notes (Signed)
Subjective:  Patient ID: [name], female    DOB: October 28, 1929  Age: 83 y.o. MRN: [id number]  CC: URI   HPI ZENDAYAH HARDGRAVE presents for a 3 day hx of runny nose, PND, facial pain, muscle aches, chills but no fever, and nasal congestion.  Outpatient Medications Prior to Visit  Medication Sig Dispense Refill  . acetaminophen (TYLENOL) 500 MG tablet Take 500 mg by mouth every 4 (four) hours as needed for mild pain or headache.    . albuterol (PROVENTIL HFA;VENTOLIN HFA) 108 (90 Base) MCG/ACT inhaler Inhale 2 puffs into the lungs every 6 (six) hours as needed for wheezing or shortness of breath. 1 Inhaler 0  . amLODipine (NORVASC) 10 MG tablet Take 1 tablet (10 mg total) by mouth daily. 90 tablet 3  . aspirin EC 81 MG tablet Take 81 mg by mouth daily.    . calcium carbonate (OS-CAL) 600 MG TABS tablet Take 600 mg by mouth 2 (two) times daily with a meal.    . cholecalciferol (VITAMIN D) 1000 UNITS tablet Take 2,000 Units by mouth daily.     . diclofenac sodium (VOLTAREN) 1 % GEL Apply 4 g topically 4 (four) times daily. 2 Tube 2  . lansoprazole (PREVACID) 15 MG capsule Take 1 capsule (15 mg total) by mouth 2 (two) times daily before a meal. 180 capsule 3  . LORazepam (ATIVAN) 1 MG tablet Take 0.5 tablets (0.5 mg total) by mouth at bedtime as needed for anxiety. 30 tablet 3  . losartan (COZAAR) 50 MG tablet Take 1 tablet (50 mg total) by mouth daily. 90 tablet 3  . meloxicam (MOBIC) 7.5 MG tablet TAKE 1 TABLET (7.5 MG TOTAL) BY MOUTH DAILY. 30 tablet 1  . metoprolol succinate (TOPROL XL) 25 MG 24 hr tablet Take 1 tablet (25 mg total) by mouth daily. 90 tablet 3  . pregabalin (LYRICA) 75 MG capsule Take 1 capsule (75 mg total) by mouth at bedtime. 56 capsule 0  . Probiotic Product (ALIGN) 4 MG CAPS Take 4 mg by mouth daily.     . rosuvastatin (CRESTOR) 10 MG tablet Take 1 tablet (10 mg total) by mouth daily. 90 tablet 3  . fluticasone (FLONASE) 50 MCG/ACT nasal spray PLACE 2 SPRAYS INTO BOTH  NOSTRILS DAILY AS NEEDED. 16 g 5  . meloxicam (MOBIC) 7.5 MG tablet Take 1 tablet (7.5 mg total) by mouth daily. 90 tablet 1  . methylPREDNISolone (MEDROL DOSEPAK) 4 MG TBPK tablet TAKE AS DIRECTED 21 tablet 0  . rosuvastatin (CRESTOR) 10 MG tablet TAKE 1 TABLET (10 MG TOTAL) BY MOUTH DAILY. 90 tablet 1  . hydrochlorothiazide (HYDRODIURIL) 25 MG tablet Take 0.5 tablets (12.5 mg total) by mouth daily. 45 tablet 1   No facility-administered medications prior to visit.     ROS Review of Systems  Constitutional: Positive for chills. Negative for activity change, fatigue and fever.  HENT: Positive for congestion, postnasal drip, rhinorrhea and sinus pain. Negative for facial swelling, nosebleeds, sinus pressure, sore throat, trouble swallowing and voice change.   Eyes: Negative.   Respiratory: Positive for cough. Negative for chest tightness, shortness of breath and stridor.   Cardiovascular: Negative.  Negative for chest pain, palpitations and leg swelling.  Gastrointestinal: Negative for abdominal pain, constipation, diarrhea, nausea and vomiting.  Endocrine: Negative.   Genitourinary: Negative.  Negative for decreased urine volume, difficulty urinating, dysuria and urgency.  Musculoskeletal: Positive for myalgias. Negative for arthralgias and neck stiffness.  Skin: Negative.  Negative  for color change and rash.  Allergic/Immunologic: Negative.   Neurological: Negative.  Negative for dizziness, weakness and numbness.  Hematological: Negative for adenopathy. Does not bruise/bleed easily.  Psychiatric/Behavioral: Negative.     Objective:  BP 132/78 (BP Location: Left Arm, Patient Position: Sitting, Cuff Size: Normal)   Pulse 72   Temp 98 F (36.7 C) (Oral)   Resp 16   Ht 5\' 5"  (1.651 m)   Wt 154 lb 8 oz (70.1 kg)   SpO2 98%   BMI 25.71 kg/m   BP Readings from Last 3 Encounters:  11/17/16 132/78  09/17/16 130/74  08/27/16 132/68    Wt Readings from Last 3 Encounters:  11/17/16  154 lb 8 oz (70.1 kg)  09/17/16 155 lb 4 oz (70.4 kg)  08/27/16 156 lb (70.8 kg)    Physical Exam  Constitutional: She is oriented to person, place, and time.  Non-toxic appearance. She does not have a sickly appearance. She does not appear ill. No distress.  HENT:  Right Ear: Tympanic membrane normal.  Left Ear: Tympanic membrane normal.  Nose: Mucosal edema and rhinorrhea present. No sinus tenderness. No epistaxis. Right sinus exhibits no maxillary sinus tenderness and no frontal sinus tenderness. Left sinus exhibits no maxillary sinus tenderness and no frontal sinus tenderness.  Mouth/Throat: Oropharynx is clear and moist and mucous membranes are normal. Mucous membranes are not pale, not dry and not cyanotic. No oral lesions. No trismus in the jaw. No uvula swelling. No oropharyngeal exudate, posterior oropharyngeal edema, posterior oropharyngeal erythema or tonsillar abscesses.  Eyes: Conjunctivae are normal. Right eye exhibits no discharge. Left eye exhibits no discharge. No scleral icterus.  Neck: Normal range of motion. No JVD present. No thyromegaly present.  Cardiovascular: Normal rate, regular rhythm and intact distal pulses.  Exam reveals no gallop and no friction rub.   No murmur heard. Pulmonary/Chest: Effort normal and breath sounds normal. No respiratory distress. She has no wheezes. She has no rales.  Abdominal: Soft. Bowel sounds are normal. She exhibits no distension and no mass. There is no tenderness. There is no rebound and no guarding.  Musculoskeletal: Normal range of motion. She exhibits no edema, tenderness or deformity.  Lymphadenopathy:    She has no cervical adenopathy.  Neurological: She is alert and oriented to person, place, and time.  Skin: Skin is warm and dry. No rash noted. She is not diaphoretic. No erythema. No pallor.  Vitals reviewed.   Lab Results  Component Value Date   WBC 9.3 01/17/2016   HGB 14.9 01/17/2016   HCT 44.5 01/17/2016   PLT 199.0  01/17/2016   GLUCOSE 97 08/20/2016   CHOL 141 04/24/2016   TRIG 129 04/24/2016   HDL 44 (L) 04/24/2016   LDLDIRECT 160.4 12/19/2008   LDLCALC 71 04/24/2016   ALT 16 04/24/2016   AST 21 04/24/2016   NA 143 08/20/2016   K 4.2 08/20/2016   CL 107 08/20/2016   CREATININE 0.81 08/20/2016   BUN 16 08/20/2016   CO2 28 08/20/2016   TSH 1.56 05/23/2015   HGBA1C 5.9 05/23/2015    Dg Chest 2 View  Result Date: 09/17/2016 CLINICAL DATA:  Cough and congestion EXAM: CHEST  2 VIEW COMPARISON:  September 14, 2014 FINDINGS: There is no edema or consolidation. Heart size and pulmonary vascularity are within normal limits. No adenopathy. There is aortic atherosclerosis. There is anterior wedging of several mid thoracic vertebral bodies, stable. IMPRESSION: No edema or consolidation. Electronically Signed   By: [name]  Jasmine December III M.D.   On: 09/17/2016 10:03    Assessment & Plan:   Brittnae was seen today for uri.  Diagnoses and all orders for this visit:  Seasonal allergic rhinitis due to pollen- she is having a severe flare. Will restart Flonase nasal spray and have offered a course of systemic steroids. -     methylPREDNISolone (MEDROL DOSEPAK) 4 MG TBPK tablet; TAKE AS DIRECTED -     fluticasone (FLONASE) 50 MCG/ACT nasal spray; Place 2 sprays into both nostrils daily as needed.  RTI (respiratory tract infection)- Her symptoms and exam are consistent with a viral cause. Will treat symptomatically. Antibiotics are not not indicated. -     HYDROcodone-homatropine (HYCODAN) 5-1.5 MG/5ML syrup; Take 5 mLs by mouth every 8 (eight) hours as needed for cough.   I have discontinued Ms. Lupa's methylPREDNISolone. I am also having her start on HYDROcodone-homatropine and methylPREDNISolone. Additionally, I am having her maintain her ALIGN, aspirin EC, cholecalciferol, calcium carbonate, acetaminophen, pregabalin, LORazepam, lansoprazole, albuterol, diclofenac sodium, hydrochlorothiazide, rosuvastatin,  amLODipine, losartan, metoprolol succinate, meloxicam, and fluticasone.  Meds ordered this encounter  Medications  . HYDROcodone-homatropine (HYCODAN) 5-1.5 MG/5ML syrup    Sig: Take 5 mLs by mouth every 8 (eight) hours as needed for cough.    Dispense:  120 mL    Refill:  0  . methylPREDNISolone (MEDROL DOSEPAK) 4 MG TBPK tablet    Sig: TAKE AS DIRECTED    Dispense:  21 tablet    Refill:  0  . fluticasone (FLONASE) 50 MCG/ACT nasal spray    Sig: Place 2 sprays into both nostrils daily as needed.    Dispense:  16 g    Refill:  5     Follow-up: Return if symptoms worsen or fail to improve.  Scarlette Calico, MD

## 2016-11-17 NOTE — Patient Instructions (Signed)
Upper Respiratory Infection, Adult Most upper respiratory infections (URIs) are caused by a virus. A URI affects the nose, throat, and upper air passages. The most common type of URI is often called "the common cold." Follow these instructions at home:  Take medicines only as told by your doctor.  Gargle warm saltwater or take cough drops to comfort your throat as told by your doctor.  Use a warm mist humidifier or inhale steam from a shower to increase air moisture. This may make it easier to breathe.  Drink enough fluid to keep your pee (urine) clear or pale yellow.  Eat soups and other clear broths.  Have a healthy diet.  Rest as needed.  Go back to work when your fever is gone or your doctor says it is okay. ? You may need to stay home longer to avoid giving your URI to others. ? You can also wear a face mask and wash your hands often to prevent spread of the virus.  Use your inhaler more if you have asthma.  Do not use any tobacco products, including cigarettes, chewing tobacco, or electronic cigarettes. If you need help quitting, ask your doctor. Contact a doctor if:  You are getting worse, not better.  Your symptoms are not helped by medicine.  You have chills.  You are getting more short of breath.  You have brown or red mucus.  You have yellow or brown discharge from your nose.  You have pain in your face, especially when you bend forward.  You have a fever.  You have puffy (swollen) neck glands.  You have pain while swallowing.  You have white areas in the back of your throat. Get help right away if:  You have very bad or constant: ? Headache. ? Ear pain. ? Pain in your forehead, behind your eyes, and over your cheekbones (sinus pain). ? Chest pain.  You have long-lasting (chronic) lung disease and any of the following: ? Wheezing. ? Long-lasting cough. ? Coughing up blood. ? A change in your usual mucus.  You have a stiff neck.  You have  changes in your: ? Vision. ? Hearing. ? Thinking. ? Mood. This information is not intended to replace advice given to you by your health care provider. Make sure you discuss any questions you have with your health care provider. Document Released: 09/03/2007 Document Revised: 11/18/2015 Document Reviewed: 06/22/2013 Elsevier Interactive Patient Education  2018 Elsevier Inc.  

## 2016-11-18 ENCOUNTER — Other Ambulatory Visit: Payer: Self-pay | Admitting: Internal Medicine

## 2016-11-18 ENCOUNTER — Telehealth: Payer: Self-pay | Admitting: Internal Medicine

## 2016-11-18 MED ORDER — ALBUTEROL SULFATE HFA 108 (90 BASE) MCG/ACT IN AERS: 2.0000 | INHALATION_SPRAY | Freq: Four times a day (QID) | RESPIRATORY_TRACT | 2 refills | Status: DC | PRN

## 2016-11-18 NOTE — Telephone Encounter (Signed)
Pt said that they discussed a prescription for an inhaler. [facility]?

## 2016-11-27 ENCOUNTER — Ambulatory Visit (INDEPENDENT_AMBULATORY_CARE_PROVIDER_SITE_OTHER): Payer: PPO | Admitting: Internal Medicine

## 2016-11-27 ENCOUNTER — Encounter: Payer: Self-pay | Admitting: Internal Medicine

## 2016-11-27 VITALS — BP 126/74 | HR 72 | Temp 98.4°F | Ht 65.0 in | Wt 156.0 lb

## 2016-11-27 DIAGNOSIS — I10 Essential (primary) hypertension: Secondary | ICD-10-CM

## 2016-11-27 DIAGNOSIS — J988 Other specified respiratory disorders: Secondary | ICD-10-CM

## 2016-11-27 DIAGNOSIS — J309 Allergic rhinitis, unspecified: Secondary | ICD-10-CM

## 2016-11-27 MED ORDER — METHYLPREDNISOLONE ACETATE 80 MG/ML IJ SUSP
80.0000 mg | Freq: Once | INTRAMUSCULAR | Status: AC
  Administered 2016-11-27: 80 mg via INTRAMUSCULAR

## 2016-11-27 MED ORDER — PREDNISONE 10 MG PO TABS: ORAL_TABLET | ORAL | 0 refills | Status: DC

## 2016-11-27 MED ORDER — HYDROCODONE-HOMATROPINE 5-1.5 MG/5ML PO SYRP: 5.0000 mL | ORAL_SOLUTION | Freq: Three times a day (TID) | ORAL | 0 refills | Status: DC | PRN

## 2016-11-27 MED ORDER — AZITHROMYCIN 250 MG PO TABS: ORAL_TABLET | ORAL | 1 refills | Status: DC

## 2016-11-27 NOTE — Progress Notes (Signed)
Subjective:    Patient ID: Krystal [name], female    DOB: May 07, 1929, 83 y.o.   MRN: [id number]  HPI   Here with 2-3 days acute onset fever, facial pain, pressure, headache, general weakness and malaise, and greenish d/c, with mild ST and cough, but pt denies chest pain, wheezing, increased sob or doe, orthopnea, PND, increased LE swelling, palpitations, dizziness or syncope. Does have several wks ongoing nasal allergy symptoms with clearish congestion, itch and sneezing, without fever, pain, ST, cough, swelling or wheezing.  Has not tried miucinex or other cough med Past Medical History:  Diagnosis Date  . Adenomatous colon polyp   . Allergy    seasonal  . Anemia    pt. denies  . Anxiety   . CAD (coronary artery disease)   . Cataract    cataracts removed left eye.  . Chronic lower back pain   . Diverticulosis   . GERD (gastroesophageal reflux disease)   . Headache(784.0)   . Hiatal hernia   . Hyperlipidemia   . Hypertension   . IBS (irritable bowel syndrome) 10/13/2011  . Osteoarthritis   . Osteoporosis   . Renal cysts, acquired, bilateral   . SBO (small bowel obstruction) ([place]) 09/05/2015  . Venous insufficiency    Past Surgical History:  Procedure Laterality Date  . ABDOMINAL HYSTERECTOMY    . BLADDER REPAIR    . CARDIAC CATHETERIZATION  01/12/2002   90% stenosis of the left circumflex, stented with a 3x19mm Cypher stent, resulting in reduction of 90% to 10%   . CARDIOVASCULAR STRESS TEST  03/22/2007   Mild inferolateral thinning toward the apex without significant ischemia. Nondiagnostic electrocardiogram.  . CATARACT EXTRACTION     bilateral  . COLONOSCOPY  08/17/2008   adenomatous polyp, diverticulosis, external hemorrhoids  . COLONOSCOPY W/ BIOPSIES     multiple   . LEFT LOWER EXTREMITY VENOUS DOPPLER  06/18/2011   No evidence of left lower extremity DVT  . TRANSTHORACIC ECHOCARDIOGRAM  11/18/2012   EF 50-53%, LV systolic mild-moderately reduced, mild-moderate mitral  valve regurg, mild-moderate tricuspid valve regurg. NSR-LBBB with occas PVCs  . UPPER GASTROINTESTINAL ENDOSCOPY  07/03/2010   hiatal hernia  . VARICOSE VEIN SURGERY      reports that she has never smoked. She has never used smokeless tobacco. She reports that she does not drink alcohol or use drugs. family history includes Heart disease in her father; Stroke in her sister. Allergies  Allergen Reactions  . Atorvastatin Other (See Comments)    Muscle aches  . Simvastatin Other (See Comments)    Myalgias   . Alendronate Sodium     REACTION: pt states INTOL to Fosamax w/ esophagitis  . Cefdinir     REACTION: diarrhea  . Esomeprazole Magnesium     REACTION: pt states INTOL to Nexium  . Levofloxacin     REACTION: diarrhea  . Omeprazole     REACTION: pt states INTOL to Prilosec  . Other     Pneumonia vaccine---felt tired and achy and sick x 6 months to get over this.  . Penicillins Hives  . Shellfish-Derived Products Hives, Nausea Only and Rash   Current Outpatient Prescriptions on File Prior to Visit  Medication Sig Dispense Refill  . acetaminophen (TYLENOL) 500 MG tablet Take 500 mg by mouth every 4 (four) hours as needed for mild pain or headache.    . albuterol (PROVENTIL HFA;VENTOLIN HFA) 108 (90 Base) MCG/ACT inhaler Inhale 2 puffs into the lungs every 6 (six)  hours as needed for wheezing or shortness of breath. 1 Inhaler 2  . amLODipine (NORVASC) 10 MG tablet Take 1 tablet (10 mg total) by mouth daily. 90 tablet 3  . aspirin EC 81 MG tablet Take 81 mg by mouth daily.    . calcium carbonate (OS-CAL) 600 MG TABS tablet Take 600 mg by mouth 2 (two) times daily with a meal.    . cholecalciferol (VITAMIN D) 1000 UNITS tablet Take 2,000 Units by mouth daily.     . diclofenac sodium (VOLTAREN) 1 % GEL Apply 4 g topically 4 (four) times daily. 2 Tube 2  . fluticasone (FLONASE) 50 MCG/ACT nasal spray Place 2 sprays into both nostrils daily as needed. 16 g 5  . lansoprazole (PREVACID) 15  MG capsule Take 1 capsule (15 mg total) by mouth 2 (two) times daily before a meal. 180 capsule 3  . LORazepam (ATIVAN) 1 MG tablet Take 0.5 tablets (0.5 mg total) by mouth at bedtime as needed for anxiety. 30 tablet 3  . losartan (COZAAR) 50 MG tablet Take 1 tablet (50 mg total) by mouth daily. 90 tablet 3  . meloxicam (MOBIC) 7.5 MG tablet TAKE 1 TABLET (7.5 MG TOTAL) BY MOUTH DAILY. 30 tablet 1  . metoprolol succinate (TOPROL XL) 25 MG 24 hr tablet Take 1 tablet (25 mg total) by mouth daily. 90 tablet 3  . pregabalin (LYRICA) 75 MG capsule Take 1 capsule (75 mg total) by mouth at bedtime. 56 capsule 0  . Probiotic Product (ALIGN) 4 MG CAPS Take 4 mg by mouth daily.     . rosuvastatin (CRESTOR) 10 MG tablet Take 1 tablet (10 mg total) by mouth daily. 90 tablet 3  . hydrochlorothiazide (HYDRODIURIL) 25 MG tablet Take 0.5 tablets (12.5 mg total) by mouth daily. 45 tablet 1   No current facility-administered medications on file prior to visit.    Review of Systems  Constitutional: Negative for other unusual diaphoresis or sweats HENT: Negative for ear discharge or swelling Eyes: Negative for other worsening visual disturbances Respiratory: Negative for stridor or other swelling  Gastrointestinal: Negative for worsening distension or other blood Genitourinary: Negative for retention or other urinary change Musculoskeletal: Negative for other MSK pain or swelling Skin: Negative for color change or other new lesions Neurological: Negative for worsening tremors and other numbness  Psychiatric/Behavioral: Negative for worsening agitation or other fatigue All other system neg per pt    Objective:   Physical Exam BP 126/74   Pulse 72   Temp 98.4 F (36.9 C) (Oral)   Ht 5\' 5"  (1.651 m)   Wt 156 lb (70.8 kg)   SpO2 99%   BMI 25.96 kg/m  VS noted,  Constitutional: Pt appears in NAD HENT: Head: NCAT.  Right Ear: External ear normal.  Left Ear: External ear normal.  Eyes: . Pupils are  equal, round, and reactive to light. Conjunctivae and EOM are normal Nose: without d/c or deformity Neck: Neck supple. Gross normal ROM Cardiovascular: Normal rate and regular rhythm.   Pulmonary/Chest: Effort normal and breath sounds without rales or wheezing.  Neurological: Pt is alert. At baseline orientation, motor grossly intact Skin: Skin is warm. No rashes, other new lesions, no LE edema Psychiatric: Pt behavior is normal without agitation  No other exam findings    Assessment & Plan:

## 2016-11-27 NOTE — Patient Instructions (Signed)
You had the steroid shot today  Please take all new medication as prescribed - the antibiotic, and prednisone  You can also take Mucinex (or it's generic off brand) for congestion, and tylenol as needed for pain.  Please continue all other medications as before, and refills have been done if requested - cough medicine  Please have the pharmacy call with any other refills you may need.  Please keep your appointments with your specialists as you may have planned

## 2016-11-29 NOTE — Assessment & Plan Note (Signed)
stable overall by history and exam, recent data reviewed with pt, and pt to continue medical treatment as before,  to f/u any worsening symptoms or concerns BP Readings from Last 3 Encounters:  11/27/16 126/74  11/17/16 132/78  09/17/16 130/74

## 2016-11-29 NOTE — Assessment & Plan Note (Signed)
Mild to mod, for antibx course,  to f/u any worsening symptoms or concerns 

## 2016-11-29 NOTE — Assessment & Plan Note (Signed)
Mild to mod flare, for depomedrol IM 80, predpac asd, to f/u any worsening symptoms or concerns

## 2016-12-02 DIAGNOSIS — H5213 Myopia, bilateral: Secondary | ICD-10-CM | POA: Diagnosis not present

## 2016-12-02 DIAGNOSIS — H43813 Vitreous degeneration, bilateral: Secondary | ICD-10-CM | POA: Diagnosis not present

## 2016-12-02 DIAGNOSIS — Z961 Presence of intraocular lens: Secondary | ICD-10-CM | POA: Diagnosis not present

## 2016-12-02 DIAGNOSIS — H26492 Other secondary cataract, left eye: Secondary | ICD-10-CM | POA: Diagnosis not present

## 2016-12-08 ENCOUNTER — Ambulatory Visit (INDEPENDENT_AMBULATORY_CARE_PROVIDER_SITE_OTHER): Payer: PPO | Admitting: Orthopedic Surgery

## 2016-12-10 DIAGNOSIS — Z1231 Encounter for screening mammogram for malignant neoplasm of breast: Secondary | ICD-10-CM | POA: Diagnosis not present

## 2016-12-10 DIAGNOSIS — Z01419 Encounter for gynecological examination (general) (routine) without abnormal findings: Secondary | ICD-10-CM | POA: Diagnosis not present

## 2016-12-15 ENCOUNTER — Other Ambulatory Visit: Payer: Self-pay | Admitting: Obstetrics and Gynecology

## 2016-12-15 DIAGNOSIS — M810 Age-related osteoporosis without current pathological fracture: Secondary | ICD-10-CM

## 2017-01-20 ENCOUNTER — Ambulatory Visit (INDEPENDENT_AMBULATORY_CARE_PROVIDER_SITE_OTHER): Payer: PPO | Admitting: General Practice

## 2017-01-20 DIAGNOSIS — Z23 Encounter for immunization: Secondary | ICD-10-CM

## 2017-02-18 ENCOUNTER — Other Ambulatory Visit (INDEPENDENT_AMBULATORY_CARE_PROVIDER_SITE_OTHER): Payer: Self-pay | Admitting: Orthopedic Surgery

## 2017-02-18 NOTE — Telephone Encounter (Signed)
Ok for rf

## 2017-02-18 NOTE — Telephone Encounter (Signed)
y

## 2017-03-25 DIAGNOSIS — H04123 Dry eye syndrome of bilateral lacrimal glands: Secondary | ICD-10-CM | POA: Diagnosis not present

## 2017-04-03 ENCOUNTER — Telehealth: Payer: Self-pay | Admitting: Cardiovascular Disease

## 2017-04-03 MED ORDER — HYDROCHLOROTHIAZIDE 25 MG PO TABS: 12.5000 mg | ORAL_TABLET | Freq: Every day | ORAL | 0 refills | Status: DC

## 2017-04-03 NOTE — Telephone Encounter (Signed)
New message    *STAT* If patient is at the pharmacy, call can be transferred to refill team.   1. Which medications need to be refilled? (please list name of each medication and dose if known) hydrochlorothiazide (HYDRODIURIL) 25 MG tablet  2. Which pharmacy/location (including street and city if local pharmacy) is [facility]  3. Do they need a 30 day or 90 day supply? [place]

## 2017-04-15 ENCOUNTER — Ambulatory Visit
Admission: RE | Admit: 2017-04-15 | Discharge: 2017-04-15 | Disposition: A | Discharge status: Home / Self Care | Payer: PPO | Source: Ambulatory Visit | Attending: Obstetrics and Gynecology | Admitting: Obstetrics and Gynecology

## 2017-04-15 DIAGNOSIS — Z780 Asymptomatic menopausal state: Secondary | ICD-10-CM | POA: Diagnosis not present

## 2017-04-15 DIAGNOSIS — M810 Age-related osteoporosis without current pathological fracture: Secondary | ICD-10-CM | POA: Diagnosis not present

## 2017-04-16 ENCOUNTER — Ambulatory Visit: Payer: PPO | Admitting: Cardiology

## 2017-04-16 ENCOUNTER — Encounter: Payer: Self-pay | Admitting: Cardiology

## 2017-04-16 VITALS — BP 152/82 | HR 68 | Ht 65.0 in | Wt 156.4 lb

## 2017-04-16 DIAGNOSIS — Z9861 Coronary angioplasty status: Secondary | ICD-10-CM | POA: Diagnosis not present

## 2017-04-16 DIAGNOSIS — I255 Ischemic cardiomyopathy: Secondary | ICD-10-CM | POA: Diagnosis not present

## 2017-04-16 DIAGNOSIS — I447 Left bundle-branch block, unspecified: Secondary | ICD-10-CM | POA: Diagnosis not present

## 2017-04-16 DIAGNOSIS — E785 Hyperlipidemia, unspecified: Secondary | ICD-10-CM

## 2017-04-16 DIAGNOSIS — I10 Essential (primary) hypertension: Secondary | ICD-10-CM | POA: Diagnosis not present

## 2017-04-16 DIAGNOSIS — I2510 Atherosclerotic heart disease of native coronary artery without angina pectoris: Secondary | ICD-10-CM | POA: Diagnosis not present

## 2017-04-16 NOTE — Progress Notes (Signed)
04/16/2017 Krystal [name]   Aug 09, 1929  [id number]  Primary Physician Janith Lima, MD Primary Cardiologist: Dr Gwenlyn Found  HPI:  Pleasant 84 y/o female followed by Dr Gwenlyn Found with a history of CAD, s/p remote CFX PCI with DES in 2003. She had a Myoview in 2008 that was normal. An echo in 2014 showed her EF to be 40-45%. She has HTN, HLD, and chronic LBBB. She is in the office today for a routine follow up. She walks about an hour a day at the gym without problems. She denies any chest pain or unusual dyspnea.    Current Outpatient Medications  Medication Sig Dispense Refill  . acetaminophen (TYLENOL) 500 MG tablet Take 500 mg by mouth every 4 (four) hours as needed for mild pain or headache.    . albuterol (PROVENTIL HFA;VENTOLIN HFA) 108 (90 Base) MCG/ACT inhaler Inhale 2 puffs into the lungs every 6 (six) hours as needed for wheezing or shortness of breath. 1 Inhaler 2  . amLODipine (NORVASC) 10 MG tablet Take 1 tablet (10 mg total) by mouth daily. 90 tablet 3  . aspirin EC 81 MG tablet Take 81 mg by mouth daily.    Marland Kitchen azithromycin (ZITHROMAX Z-PAK) 250 MG tablet 2 tab by mouth day 1, then 1 per day 6 tablet 1  . calcium carbonate (OS-CAL) 600 MG TABS tablet Take 600 mg by mouth 2 (two) times daily with a meal.    . cholecalciferol (VITAMIN D) 1000 UNITS tablet Take 2,000 Units by mouth daily.     . diclofenac sodium (VOLTAREN) 1 % GEL APPLY 4 G TOPICALLY 4 (FOUR) TIMES DAILY. 200 g 2  . fluticasone (FLONASE) 50 MCG/ACT nasal spray Place 2 sprays into both nostrils daily as needed. 16 g 5  . hydrochlorothiazide (HYDRODIURIL) 25 MG tablet Take 0.5 tablets (12.5 mg total) by mouth daily. 30 tablet 0  . HYDROcodone-homatropine (HYCODAN) 5-1.5 MG/5ML syrup Take 5 mLs by mouth every 8 (eight) hours as needed for cough. 120 mL 0  . lansoprazole (PREVACID) 15 MG capsule Take 1 capsule (15 mg total) by mouth 2 (two) times daily before a meal. 180 capsule 3  . LORazepam (ATIVAN) 1 MG tablet Take 0.5  tablets (0.5 mg total) by mouth at bedtime as needed for anxiety. 30 tablet 3  . losartan (COZAAR) 50 MG tablet Take 1 tablet (50 mg total) by mouth daily. 90 tablet 3  . meloxicam (MOBIC) 7.5 MG tablet TAKE 1 TABLET (7.5 MG TOTAL) BY MOUTH DAILY. 30 tablet 1  . metoprolol succinate (TOPROL XL) 25 MG 24 hr tablet Take 1 tablet (25 mg total) by mouth daily. 90 tablet 3  . predniSONE (DELTASONE) 10 MG tablet 3 tabs by mouth per day for 3 days,2tabs per day for 3 days,1tab per day for 3 days 18 tablet 0  . pregabalin (LYRICA) 75 MG capsule Take 1 capsule (75 mg total) by mouth at bedtime. 56 capsule 0  . Probiotic Product (ALIGN) 4 MG CAPS Take 4 mg by mouth daily.     . rosuvastatin (CRESTOR) 10 MG tablet Take 1 tablet (10 mg total) by mouth daily. 90 tablet 3   No current facility-administered medications for this visit.     Allergies  Allergen Reactions  . Atorvastatin Other (See Comments)    Muscle aches  . Simvastatin Other (See Comments)    Myalgias   . Alendronate Sodium     REACTION: pt states INTOL to Fosamax w/ esophagitis  .  Cefdinir     REACTION: diarrhea  . Esomeprazole Magnesium     REACTION: pt states INTOL to Nexium  . Levofloxacin     REACTION: diarrhea  . Omeprazole     REACTION: pt states INTOL to Prilosec  . Other     Pneumonia vaccine---felt tired and achy and sick x 6 months to get over this.  . Penicillins Hives  . Shellfish-Derived Products Hives, Nausea Only and Rash    Past Medical History:  Diagnosis Date  . Adenomatous colon polyp   . Allergy    seasonal  . Anemia    pt. denies  . Anxiety   . CAD (coronary artery disease)   . Cataract    cataracts removed left eye.  . Chronic lower back pain   . Diverticulosis   . GERD (gastroesophageal reflux disease)   . Headache(784.0)   . Hiatal hernia   . Hyperlipidemia   . Hypertension   . IBS (irritable bowel syndrome) 10/13/2011  . Osteoarthritis   . Osteoporosis   . Renal cysts, acquired,  bilateral   . SBO (small bowel obstruction) ([place]) 09/05/2015  . Venous insufficiency     Social History   Socioeconomic History  . Marital status: Married    Spouse name: [name]  . Number of children: Not on file  . Years of education: Not on file  . Highest education level: Not on file  Social Needs  . Financial resource strain: Not on file  . Food insecurity - worry: Not on file  . Food insecurity - inability: Not on file  . Transportation needs - medical: Not on file  . Transportation needs - non-medical: Not on file  Occupational History  . Occupation: retired  Tobacco Use  . Smoking status: Never Smoker  . Smokeless tobacco: Never Used  Substance and Sexual Activity  . Alcohol use: No    Alcohol/week: 0.0 oz  . Drug use: No  . Sexual activity: Not on file  Other Topics Concern  . Not on file  Social History Narrative  . Not on file     Family History  Problem Relation Age of Onset  . Heart disease Father   . Stroke Sister   . Colon cancer Neg Hx      Review of Systems: General: negative for chills, fever, night sweats or weight changes.  Cardiovascular: negative for chest pain, dyspnea on exertion, edema, orthopnea, palpitations, paroxysmal nocturnal dyspnea or shortness of breath Dermatological: negative for rash Respiratory: negative for cough or wheezing Urologic: negative for hematuria Abdominal: negative for nausea, vomiting, diarrhea, bright red blood per rectum, melena, or hematemesis Neurologic: negative for visual changes, syncope, or dizziness All other systems reviewed and are otherwise negative except as noted above.    Blood pressure (!) 152/82, pulse 68, height 5\' 5"  (1.651 m), weight 156 lb 6.4 oz (70.9 kg), SpO2 98 %.  General appearance: alert, cooperative and no distress Neck: no carotid bruit and no JVD Lungs: clear to auscultation bilaterally Heart: regular rate and rhythm Extremities: extremities normal, atraumatic, no cyanosis or  edema Skin: Skin color, texture, turgor normal. No rashes or lesions Neurologic: Grossly normal   ASSESSMENT AND PLAN:   CAD S/P percutaneous coronary angioplasty CFX PCI in '03, low risk Nuc in '08 Doing well, no angina  Essential hypertension Repeat B/P by me 122/60. She tells me her B/P at home usually is in this range  Hyperlipidemia with target LDL less than 70 Due for Lipids  LBBB (left bundle branch block) Chronic   PLAN  She is taking her HCTZ daily. I ordered a CMET and lipid panel. She can f/u with Dr Gwenlyn Found in 6 months.   Kerin Ransom PA-C 04/16/2017 8:09 AM

## 2017-04-16 NOTE — Assessment & Plan Note (Signed)
Repeat B/P by me 122/60. She tells me her B/P at home usually is in this range

## 2017-04-16 NOTE — Assessment & Plan Note (Signed)
CFX PCI in '03, low risk Nuc in '08 Doing well, no angina

## 2017-04-16 NOTE — Patient Instructions (Signed)
Medication Instructions:  Your physician recommends that you continue on your current medications as directed. Please refer to the Current Medication list given to you today.  Labwork: Your physician recommends that you return for lab work in: ANYTIME [place] [facility] Legrand Rams PANEL, CMET  Testing/Procedures: None   Follow-Up: Your physician wants you to follow-up in: 6 Months with DR BERRY. You will receive a reminder letter in the mail two months in advance  on or around (08/14/2017). If you don't receive a letter, please call our office to schedule the follow-up appointment.  Any Other Special Instructions Will Be Listed Below (If Applicable).  If you need a refill on your cardiac medications before your next appointment, please call your pharmacy.

## 2017-04-16 NOTE — Assessment & Plan Note (Signed)
Chronic. 

## 2017-04-16 NOTE — Assessment & Plan Note (Signed)
Due for Lipids

## 2017-04-22 DIAGNOSIS — X32XXXA Exposure to sunlight, initial encounter: Secondary | ICD-10-CM | POA: Diagnosis not present

## 2017-04-22 DIAGNOSIS — L570 Actinic keratosis: Secondary | ICD-10-CM | POA: Diagnosis not present

## 2017-04-28 DIAGNOSIS — I255 Ischemic cardiomyopathy: Secondary | ICD-10-CM | POA: Diagnosis not present

## 2017-04-28 LAB — COMPREHENSIVE METABOLIC PANEL
ALT: 14 IU/L (ref 0–32)
AST: 20 IU/L (ref 0–40)
Albumin/Globulin Ratio: 1.9 (ref 1.2–2.2)
Albumin: 4.2 g/dL (ref 3.5–4.7)
Alkaline Phosphatase: 118 IU/L — ABNORMAL HIGH (ref 39–117)
BUN/Creatinine Ratio: 22 (ref 12–28)
BUN: 17 mg/dL (ref 8–27)
Bilirubin Total: 0.5 mg/dL (ref 0.0–1.2)
CO2: 23 mmol/L (ref 20–29)
Calcium: 9 mg/dL (ref 8.7–10.3)
Chloride: 103 mmol/L (ref 96–106)
Creatinine, Ser: 0.76 mg/dL (ref 0.57–1.00)
GFR calc Af Amer: 83 mL/min/{1.73_m2} (ref >59)
GFR calc non Af Amer: 72 mL/min/{1.73_m2} (ref >59)
Globulin, Total: 2.2 g/dL (ref 1.5–4.5)
Glucose: 89 mg/dL (ref 65–99)
Potassium: 3.9 mmol/L (ref 3.5–5.2)
Sodium: 144 mmol/L (ref 134–144)
Total Protein: 6.4 g/dL (ref 6.0–8.5)

## 2017-04-28 LAB — LIPID PANEL
Chol/HDL Ratio: 3.1 ratio (ref 0.0–4.4)
Cholesterol, Total: 137 mg/dL (ref 100–199)
HDL: 44 mg/dL (ref >39)
LDL Calculated: 70 mg/dL (ref 0–99)
Triglycerides: 115 mg/dL (ref 0–149)
VLDL Cholesterol Cal: 23 mg/dL (ref 5–40)

## 2017-04-30 ENCOUNTER — Other Ambulatory Visit: Payer: Self-pay | Admitting: Internal Medicine

## 2017-05-12 ENCOUNTER — Ambulatory Visit (INDEPENDENT_AMBULATORY_CARE_PROVIDER_SITE_OTHER): Payer: PPO | Admitting: Family Medicine

## 2017-05-12 ENCOUNTER — Encounter: Payer: Self-pay | Admitting: Family Medicine

## 2017-05-12 VITALS — BP 132/78 | HR 67 | Temp 97.7°F | Ht 65.0 in | Wt 155.0 lb

## 2017-05-12 DIAGNOSIS — R059 Cough, unspecified: Secondary | ICD-10-CM

## 2017-05-12 DIAGNOSIS — R05 Cough: Secondary | ICD-10-CM

## 2017-05-12 MED ORDER — GUAIFENESIN-CODEINE 100-10 MG/5ML PO SYRP: 5.0000 mL | ORAL_SOLUTION | Freq: Three times a day (TID) | ORAL | 0 refills | Status: DC | PRN

## 2017-05-12 NOTE — Assessment & Plan Note (Signed)
Likely viral in nature. - Counseled on supportive care - Cough syrup provided - Advised follow-up in one week. If no improvement consider chest x-ray and or azithromycin.

## 2017-05-12 NOTE — Patient Instructions (Signed)
Please try things such as zyrtec-D or allegra-D which is an antihistamine and decongestant.   Please try afrin which will help with nasal congestion but use for only three days.   Please also try using a netti pot on a regular occasion.  Honey can help with a sore throat.

## 2017-05-12 NOTE — Progress Notes (Signed)
KHALAYA MCGURN - 84 y.o. female MRN [id number]  Date of birth: 01/07/1930  SUBJECTIVE:  Including CC & ROS.  Chief Complaint  Patient presents with  . Cough    AVENELL SELLERS is a 84 y.o. female that is presenting with a cough and sore throat. Symptoms have been ongoing for one week. She has been taking other the counter cough syrup and coricidin. Admits to chills and fevers. She has not been in direct contact with anyone similar symptoms. She does get her yearly flu shot. Denies producing mucous.      Review of Systems  Constitutional: Positive for chills.  Respiratory: Positive for cough.   Cardiovascular: Negative for chest pain.  Gastrointestinal: Negative for abdominal pain.  Skin: Negative for color change.    HISTORY: Past Medical, Surgical, Social, and Family History Reviewed & Updated per EMR.   Pertinent Historical Findings include:  Past Medical History:  Diagnosis Date  . Adenomatous colon polyp   . Allergy    seasonal  . Anemia    pt. denies  . Anxiety   . CAD (coronary artery disease)   . Cataract    cataracts removed left eye.  . Chronic lower back pain   . Diverticulosis   . GERD (gastroesophageal reflux disease)   . Headache(784.0)   . Hiatal hernia   . Hyperlipidemia   . Hypertension   . IBS (irritable bowel syndrome) 10/13/2011  . Osteoarthritis   . Osteoporosis   . Renal cysts, acquired, bilateral   . SBO (small bowel obstruction) ([place]) 09/05/2015  . Venous insufficiency     Past Surgical History:  Procedure Laterality Date  . ABDOMINAL HYSTERECTOMY    . BLADDER REPAIR    . CARDIAC CATHETERIZATION  01/12/2002   90% stenosis of the left circumflex, stented with a 3x18mm Cypher stent, resulting in reduction of 90% to 10%   . CARDIOVASCULAR STRESS TEST  03/22/2007   Mild inferolateral thinning toward the apex without significant ischemia. Nondiagnostic electrocardiogram.  . CATARACT EXTRACTION     bilateral  . COLONOSCOPY  08/17/2008   adenomatous polyp, diverticulosis, external hemorrhoids  . COLONOSCOPY W/ BIOPSIES     multiple   . LEFT LOWER EXTREMITY VENOUS DOPPLER  06/18/2011   No evidence of left lower extremity DVT  . TRANSTHORACIC ECHOCARDIOGRAM  11/18/2012   EF 93-81%, LV systolic mild-moderately reduced, mild-moderate mitral valve regurg, mild-moderate tricuspid valve regurg. NSR-LBBB with occas PVCs  . UPPER GASTROINTESTINAL ENDOSCOPY  07/03/2010   hiatal hernia  . VARICOSE VEIN SURGERY      Allergies  Allergen Reactions  . Atorvastatin Other (See Comments)    Muscle aches  . Simvastatin Other (See Comments)    Myalgias   . Alendronate Sodium     REACTION: pt states INTOL to Fosamax w/ esophagitis  . Cefdinir     REACTION: diarrhea  . Esomeprazole Magnesium     REACTION: pt states INTOL to Nexium  . Levofloxacin     REACTION: diarrhea  . Omeprazole     REACTION: pt states INTOL to Prilosec  . Other     Pneumonia vaccine---felt tired and achy and sick x 6 months to get over this.  . Penicillins Hives  . Shellfish-Derived Products Hives, Nausea Only and Rash    Family History  Problem Relation Age of Onset  . Heart disease Father   . Stroke Sister   . Colon cancer Neg Hx      Social History   Socioeconomic History  .  Marital status: Married    Spouse name: [name]  . Number of children: Not on file  . Years of education: Not on file  . Highest education level: Not on file  Social Needs  . Financial resource strain: Not on file  . Food insecurity - worry: Not on file  . Food insecurity - inability: Not on file  . Transportation needs - medical: Not on file  . Transportation needs - non-medical: Not on file  Occupational History  . Occupation: retired  Tobacco Use  . Smoking status: Never Smoker  . Smokeless tobacco: Never Used  Substance and Sexual Activity  . Alcohol use: No    Alcohol/week: 0.0 oz  . Drug use: No  . Sexual activity: Not on file  Other Topics Concern  . Not  on file  Social History Narrative  . Not on file     PHYSICAL EXAM:  VS: BP 132/78 (BP Location: Left Arm, Patient Position: Sitting, Cuff Size: Normal)   Pulse 67   Temp 97.7 F (36.5 C) (Oral)   Ht 5\' 5"  (1.651 m)   Wt 155 lb (70.3 kg)   SpO2 97%   BMI 25.79 kg/m  Physical Exam Gen: NAD, alert, cooperative with exam, ENT: normal lips, normal nasal mucosa, tympanic membranes clear and intact bilaterally, normal oropharynx, no cervical lymphadenopathy Eye: normal EOM, normal conjunctiva and lids CV:  no edema, +2 pedal pulses, regular rate and rhythm, S1-S2   Resp: no accessory muscle use, non-labored, clear to auscultation bilaterally, no crackles or wheezes  Skin: no rashes, no areas of induration  Neuro: normal tone, normal sensation to touch Psych:  normal insight, alert and oriented MSK: Normal gait, normal strength       ASSESSMENT & PLAN:   Cough Likely viral in nature. - Counseled on supportive care - Cough syrup provided - Advised follow-up in one week. If no improvement consider chest x-ray and or azithromycin.

## 2017-05-13 ENCOUNTER — Ambulatory Visit (INDEPENDENT_AMBULATORY_CARE_PROVIDER_SITE_OTHER): Payer: PPO | Admitting: Orthopedic Surgery

## 2017-05-20 ENCOUNTER — Encounter: Payer: Self-pay | Admitting: Internal Medicine

## 2017-05-20 ENCOUNTER — Ambulatory Visit (INDEPENDENT_AMBULATORY_CARE_PROVIDER_SITE_OTHER): Payer: PPO | Admitting: Internal Medicine

## 2017-05-20 VITALS — BP 138/82 | HR 71 | Temp 97.6°F | Wt 155.0 lb

## 2017-05-20 DIAGNOSIS — J0111 Acute recurrent frontal sinusitis: Secondary | ICD-10-CM

## 2017-05-20 DIAGNOSIS — J019 Acute sinusitis, unspecified: Secondary | ICD-10-CM | POA: Insufficient documentation

## 2017-05-20 DIAGNOSIS — J301 Allergic rhinitis due to pollen: Secondary | ICD-10-CM | POA: Diagnosis not present

## 2017-05-20 DIAGNOSIS — F411 Generalized anxiety disorder: Secondary | ICD-10-CM

## 2017-05-20 MED ORDER — LORAZEPAM 1 MG PO TABS: 0.5000 mg | ORAL_TABLET | Freq: Every evening | ORAL | 3 refills | Status: DC | PRN

## 2017-05-20 MED ORDER — CLINDAMYCIN HCL 300 MG PO CAPS: 300.0000 mg | ORAL_CAPSULE | Freq: Three times a day (TID) | ORAL | 0 refills | Status: AC

## 2017-05-20 NOTE — Patient Instructions (Signed)

## 2017-05-21 ENCOUNTER — Telehealth: Payer: Self-pay | Admitting: Internal Medicine

## 2017-05-21 MED ORDER — METHYLPREDNISOLONE 4 MG PO TBPK: ORAL_TABLET | ORAL | 0 refills | Status: DC

## 2017-05-21 NOTE — Progress Notes (Signed)
Subjective:  Patient ID: Krystal [name], female    DOB: 12/03/1929  Age: 84 y.o. MRN: [id number]  CC: Sinusitis   HPI Krystal [name] presents for a 2-week history of facial pain, chills, thick yellow nasal phlegm, nonproductive cough, facial and forehead pain, nasal congestion, and postnasal drip.  Outpatient Medications Prior to Visit  Medication Sig Dispense Refill  . acetaminophen (TYLENOL) 500 MG tablet Take 500 mg by mouth every 4 (four) hours as needed for mild pain or headache.    . albuterol (PROVENTIL HFA;VENTOLIN HFA) 108 (90 Base) MCG/ACT inhaler Inhale 2 puffs into the lungs every 6 (six) hours as needed for wheezing or shortness of breath. 1 Inhaler 2  . amLODipine (NORVASC) 10 MG tablet Take 1 tablet (10 mg total) by mouth daily. 90 tablet 3  . aspirin EC 81 MG tablet Take 81 mg by mouth daily.    . calcium carbonate (OS-CAL) 600 MG TABS tablet Take 600 mg by mouth 2 (two) times daily with a meal.    . cholecalciferol (VITAMIN D) 1000 UNITS tablet Take 2,000 Units by mouth daily.     . diclofenac sodium (VOLTAREN) 1 % GEL APPLY 4 G TOPICALLY 4 (FOUR) TIMES DAILY. 200 g 2  . fluticasone (FLONASE) 50 MCG/ACT nasal spray Place 2 sprays into both nostrils daily as needed. 16 g 5  . guaiFENesin-codeine (ROBITUSSIN AC) 100-10 MG/5ML syrup Take 5 mLs by mouth 3 (three) times daily as needed for cough. 80 mL 0  . hydrochlorothiazide (HYDRODIURIL) 25 MG tablet Take 0.5 tablets (12.5 mg total) by mouth daily. 30 tablet 0  . lansoprazole (PREVACID) 15 MG capsule TAKE 1 CAPSULE BY MOUTH TWICE A DAY BEFORE MEALS 180 capsule 0  . losartan (COZAAR) 50 MG tablet Take 1 tablet (50 mg total) by mouth daily. 90 tablet 3  . meloxicam (MOBIC) 7.5 MG tablet TAKE 1 TABLET (7.5 MG TOTAL) BY MOUTH DAILY. 30 tablet 1  . metoprolol succinate (TOPROL XL) 25 MG 24 hr tablet Take 1 tablet (25 mg total) by mouth daily. 90 tablet 3  . pregabalin (LYRICA) 75 MG capsule Take 1 capsule (75 mg total) by mouth at  bedtime. 56 capsule 0  . Probiotic Product (ALIGN) 4 MG CAPS Take 4 mg by mouth daily.     . rosuvastatin (CRESTOR) 10 MG tablet Take 1 tablet (10 mg total) by mouth daily. 90 tablet 3  . LORazepam (ATIVAN) 1 MG tablet Take 0.5 tablets (0.5 mg total) by mouth at bedtime as needed for anxiety. 30 tablet 3   No facility-administered medications prior to visit.     ROS Review of Systems  Constitutional: Positive for chills. Negative for fatigue and fever.  HENT: Positive for congestion, postnasal drip, rhinorrhea, sinus pressure and sinus pain. Negative for facial swelling, sore throat and trouble swallowing.   Eyes: Negative.   Respiratory: Positive for cough. Negative for chest tightness, shortness of breath and wheezing.   Cardiovascular: Negative.  Negative for chest pain, palpitations and leg swelling.  Gastrointestinal: Negative for abdominal pain, constipation, diarrhea, nausea and vomiting.  Endocrine: Negative.   Genitourinary: Negative.  Negative for difficulty urinating.  Musculoskeletal: Negative.  Negative for back pain and myalgias.  Skin: Negative.  Negative for color change and pallor.  Allergic/Immunologic: Negative.   Neurological: Negative.  Negative for dizziness, weakness, light-headedness and headaches.  Hematological: Negative for adenopathy. Does not bruise/bleed easily.  Psychiatric/Behavioral: Positive for sleep disturbance. Negative for confusion, decreased concentration, dysphoric mood, self-injury  and suicidal ideas. The patient is nervous/anxious.     Objective:  BP 138/82 (BP Location: Left Arm, Patient Position: Sitting, Cuff Size: Normal)   Pulse 71   Temp 97.6 F (36.4 C) (Oral)   Wt 155 lb (70.3 kg)   SpO2 97%   BMI 25.79 kg/m   BP Readings from Last 3 Encounters:  05/20/17 138/82  05/12/17 132/78  04/16/17 (!) 152/82    Wt Readings from Last 3 Encounters:  05/20/17 155 lb (70.3 kg)  05/12/17 155 lb (70.3 kg)  04/16/17 156 lb 6.4 oz (70.9  kg)    Physical Exam  Constitutional: She is oriented to person, place, and time.  Non-toxic appearance. She does not have a sickly appearance. She does not appear ill. No distress.  HENT:  Nose: Mucosal edema and rhinorrhea present. No sinus tenderness. No epistaxis. Right sinus exhibits maxillary sinus tenderness and frontal sinus tenderness. Left sinus exhibits maxillary sinus tenderness and frontal sinus tenderness.  Mouth/Throat: Oropharynx is clear and moist. No oropharyngeal exudate.  Eyes: Conjunctivae are normal. Left eye exhibits no discharge. No scleral icterus.  Neck: Normal range of motion. Neck supple. No JVD present. No thyromegaly present.  Cardiovascular: Normal rate, regular rhythm and normal heart sounds. Exam reveals no gallop.  No murmur heard. Pulmonary/Chest: Effort normal and breath sounds normal. No respiratory distress. She has no wheezes. She has no rales.  Abdominal: Soft. Bowel sounds are normal. She exhibits no distension and no mass. There is no tenderness.  Musculoskeletal: Normal range of motion. She exhibits no edema or tenderness.  Lymphadenopathy:    She has no cervical adenopathy.  Neurological: She is alert and oriented to person, place, and time.  Skin: Skin is warm and dry. No rash noted. She is not diaphoretic. No erythema. No pallor.  Vitals reviewed.   Lab Results  Component Value Date   WBC 9.3 01/17/2016   HGB 14.9 01/17/2016   HCT 44.5 01/17/2016   PLT 199.0 01/17/2016   GLUCOSE 89 04/28/2017   CHOL 137 04/28/2017   TRIG 115 04/28/2017   HDL 44 04/28/2017   LDLDIRECT 160.4 12/19/2008   LDLCALC 70 04/28/2017   ALT 14 04/28/2017   AST 20 04/28/2017   NA 144 04/28/2017   K 3.9 04/28/2017   CL 103 04/28/2017   CREATININE 0.76 04/28/2017   BUN 17 04/28/2017   CO2 23 04/28/2017   TSH 1.56 05/23/2015   HGBA1C 5.9 05/23/2015    Dg Bone Density (dxa)  Result Date: 04/15/2017 EXAM: DUAL X-RAY ABSORPTIOMETRY (DXA) FOR BONE MINERAL  DENSITY IMPRESSION: Referring Physician:  Avon Gully PATIENT: Name: Krystal [name] Patient ID: [id number] Birth Date: 03-15-1930 Height: 65.0 in. Sex: Female Measured: 04/15/2017 Weight: 155.5 lbs. Indications: Advanced Age, Caucasian, Estrogen Deficient, Height Loss (781.91), Hysterectomy, osteoarthiritis, Osteoporosis (733), Postmenopausal Fractures: None Treatments: Calcium (E943.0), Premarin, Prolia, Vitamin D (E933.5) ASSESSMENT: The BMD measured at Femur Total Left is 0.623 g/cm2 with a T-score of -3.1. This patient is considered osteoporotic according to [facility]) criteria. There has been no statistically significant change in BMD of Lumbar spine, or of Dual Femur hips since prior exam dated 03/30/2015. Site Region Measured Date Measured Age YA BMD Significant CHANGE T-score DualFemur Total Left 04/15/2017    85.2         -3.1    0.623 g/cm2 AP Spine  L1-L4      04/15/2017    85.2         -2.9  0.835 g/cm2 World Health Organization [facility]) criteria for post-menopausal, Caucasian Women: Normal       T-score at or above -1 SD Osteopenia   T-score between -1 and -2.5 SD Osteoporosis T-score at or below -2.5 SD RECOMMENDATION: [facility] recommends that FDA-approved medical therapies be considered in postmenopausal women and men age 63 or older with a: 1. Hip or vertebral (clinical or morphometric) fracture. 2. T-score of <-2.5 at the spine or hip. 3. Ten-year fracture probability by FRAX of 3% or greater for hip fracture or 20% or greater for major osteoporotic fracture. All treatment decisions require clinical judgment and consideration of individual patient factors, including patient preferences, co-morbidities, previous drug use, risk factors not captured in the FRAX model (e.g. falls, vitamin D deficiency, increased bone turnover, interval significant decline in bone density) and possible under - or over-estimation of fracture risk by FRAX. All patients should  ensure an adequate intake of dietary calcium (1200 mg/d) and vitamin D (800 IU daily) unless contraindicated. FOLLOW-UP: People with diagnosed cases of osteoporosis or at high risk for fracture should have regular bone mineral density tests. For patients eligible for Medicare, routine testing is allowed once every 2 years. The testing frequency can be increased to one year for patients who have rapidly progressing disease, those who are receiving or discontinuing medical therapy to restore bone mass, or have additional risk factors. I have reviewed this report, and agree with the above findings. [facility] Radiology Electronically Signed   By: Marin Olp M.D.   On: 04/15/2017 09:58    Assessment & Plan:   Lorien was seen today for sinusitis.  Diagnoses and all orders for this visit:  GAD (generalized anxiety disorder) -     LORazepam (ATIVAN) 1 MG tablet; Take 0.5 tablets (0.5 mg total) by mouth at bedtime as needed for anxiety.  Acute recurrent frontal sinusitis- She has multiple, significant drug allergies and a rather serious sinus.  I will treat the infection with clindamycin. -     clindamycin (CLEOCIN) 300 MG capsule; Take 1 capsule (300 mg total) by mouth 3 (three) times daily for 10 days.  Non-seasonal allergic rhinitis due to pollen- Will offer a course of systemic steroids for symptom relief. -     methylPREDNISolone (MEDROL DOSEPAK) 4 MG TBPK tablet; TAKE AS DIRECTED   I am having Krystelle L. Farkas start on clindamycin and methylPREDNISolone. I am also having her maintain her ALIGN, aspirin EC, cholecalciferol, calcium carbonate, acetaminophen, pregabalin, rosuvastatin, amLODipine, losartan, metoprolol succinate, meloxicam, fluticasone, albuterol, diclofenac sodium, hydrochlorothiazide, lansoprazole, guaiFENesin-codeine, and LORazepam.  Meds ordered this encounter  Medications  . clindamycin (CLEOCIN) 300 MG capsule    Sig: Take 1 capsule (300 mg total) by mouth 3 (three) times daily  for 10 days.    Dispense:  30 capsule    Refill:  0  . LORazepam (ATIVAN) 1 MG tablet    Sig: Take 0.5 tablets (0.5 mg total) by mouth at bedtime as needed for anxiety.    Dispense:  30 tablet    Refill:  3  . methylPREDNISolone (MEDROL DOSEPAK) 4 MG TBPK tablet    Sig: TAKE AS DIRECTED    Dispense:  21 tablet    Refill:  0     Follow-up: Return in about 3 weeks (around 06/10/2017).  Scarlette Calico, MD

## 2017-05-21 NOTE — Telephone Encounter (Signed)
Routing to dr jones, please advise, thanks 

## 2017-05-21 NOTE — Telephone Encounter (Signed)
Copied from [place]. Topic: Quick Communication - See Telephone Encounter >> May 21, 2017 11:07 AM Oneta Rack wrote:  Relation to pt: self Call back number: [phone] Pharmacy: [id number] [address], [place] (Phone) [phone] (Fax  Reason for call:  Patient was seen 05/20/17 by Dr. Ronnald Ramp, patient states clindamycin (CLEOCIN) 300 MG capsule is causing her to experience diarrhea and would like PCP to prescribe prednisone, please advise  >> May 21, 2017 11:10 AM Oneta Rack wrote:  Relation to pt: self Call back number: [phone] Pharmacy[id number] [address], [place] (Phone) [phone] (Fax  Reason for call:  Patient was seen 05/20/17 by Dr. Ronnald Ramp, patient states clindamycin (CLEOCIN) 300 MG capsule is causing her to experience diarrhea and would like PCP to prescribe prednisone, please advise

## 2017-05-25 ENCOUNTER — Other Ambulatory Visit: Payer: Self-pay | Admitting: Internal Medicine

## 2017-05-25 DIAGNOSIS — M545 Low back pain: Principal | ICD-10-CM

## 2017-05-25 DIAGNOSIS — M150 Primary generalized (osteo)arthritis: Secondary | ICD-10-CM

## 2017-05-25 DIAGNOSIS — M159 Polyosteoarthritis, unspecified: Secondary | ICD-10-CM

## 2017-05-25 DIAGNOSIS — G8929 Other chronic pain: Secondary | ICD-10-CM

## 2017-06-08 DIAGNOSIS — H04123 Dry eye syndrome of bilateral lacrimal glands: Secondary | ICD-10-CM | POA: Diagnosis not present

## 2017-06-08 DIAGNOSIS — H0100A Unspecified blepharitis right eye, upper and lower eyelids: Secondary | ICD-10-CM | POA: Diagnosis not present

## 2017-06-08 DIAGNOSIS — H0100B Unspecified blepharitis left eye, upper and lower eyelids: Secondary | ICD-10-CM | POA: Diagnosis not present

## 2017-06-16 ENCOUNTER — Other Ambulatory Visit: Payer: Self-pay | Admitting: Cardiovascular Disease

## 2017-06-16 DIAGNOSIS — I2510 Atherosclerotic heart disease of native coronary artery without angina pectoris: Secondary | ICD-10-CM

## 2017-06-16 DIAGNOSIS — E785 Hyperlipidemia, unspecified: Secondary | ICD-10-CM

## 2017-06-16 MED ORDER — ROSUVASTATIN CALCIUM 10 MG PO TABS: 10.0000 mg | ORAL_TABLET | Freq: Every day | ORAL | 3 refills | Status: DC

## 2017-06-16 NOTE — Telephone Encounter (Signed)
°*  STAT* If patient is at the pharmacy, call can be transferred to refill team.   1. Which medications need to be refilled? (please list name of each medication and dose if known) Need a new prescription for Rosuvastatin  2. Which pharmacy/location (including street and city if local pharmacy) is medication [phone]  3. Do they need a 30 day or 90 day supply? 90 and refills

## 2017-06-16 NOTE — Telephone Encounter (Signed)
Rx sent to pharmacy; patient notified directly.

## 2017-06-30 ENCOUNTER — Ambulatory Visit (INDEPENDENT_AMBULATORY_CARE_PROVIDER_SITE_OTHER): Payer: PPO | Admitting: Orthopaedic Surgery

## 2017-06-30 ENCOUNTER — Encounter (INDEPENDENT_AMBULATORY_CARE_PROVIDER_SITE_OTHER): Payer: Self-pay | Admitting: Orthopaedic Surgery

## 2017-06-30 DIAGNOSIS — M79605 Pain in left leg: Secondary | ICD-10-CM | POA: Diagnosis not present

## 2017-06-30 NOTE — Progress Notes (Signed)
Office Visit Note   Patient: Krystal [name]           Date of Birth: 07-Jun-1929           MRN: [id number] Visit Date: 06/30/2017              Requested by: Janith Lima, MD [address] [place], [place] [address] PCP: Janith Lima, MD   Assessment & Plan: Visit Diagnoses:  1. Pain in left leg     Plan: I gave her reassurance that this is not appear to be occult blood clot and she should definitely go back on her 81 mg aspirin daily.  She has no pain and she is walking without a limp.  All questions and concerns were answered and addressed.  She will follow-up as needed.   Follow-Up Instructions: Return if symptoms worsen or fail to improve.   Orders:  No orders of the defined types were placed in this encounter.  No orders of the defined types were placed in this encounter.     Procedures: No procedures performed   Clinical Data: No additional findings.   Subjective: Chief Complaint  Patient presents with  . Left Leg - Pain  The patient is a very pleasant 84 year old female comes in today to have [place] look at her left calf.  She awoke a week ago with bruising at the proximal medial calf in the popliteal area.  It was a little bit painful.  She is on 81 mg aspirin and stop this.  She said now this is resolving and she has no pain to the bruising is still present.  She denies any numbness and tingling in the foot or ankle.  She denies any swelling.  She denies any significant pain.  She denies any shortness of breath either.  She currently denies any headache, chest pain, shortness of breath, fever, chills, nausea, vomiting.  HPI  Review of Systems See above  Objective: Vital Signs: There were no vitals taken for this visit.  Physical Exam She is alert and oriented x3 and in no acute distress Ortho Exam Examination of her left lower extremity shows bruising over the gastroc muscle near its insertion but no knee joint effusion at all.  Her calf is  nice and soft with no palpable cords.  Her foot is well-perfused with normal motor and sensory function.  There is no swelling in her foot at all.  Her range of motion of her knee is entirely full.  There is bruising that appears to be about a week old.  She is nontender around the bruised area at all.   Specialty Comments:  No specialty comments available.  Imaging: No results found.   PMFS History: Patient Active Problem List   Diagnosis Date Noted  . Acute recurrent frontal sinusitis 05/20/2017  . LBBB (left bundle branch block) 07/16/2016  . Cardiomyopathy ([place]) 07/16/2016  . Benign paroxysmal positional vertigo 01/17/2016  . Hyperglycemia 05/23/2015  . IBS (irritable bowel syndrome) 10/13/2011  . GERD (gastroesophageal reflux disease) 06/25/2010  . RENAL CYST 11/18/2007  . OSTEOPOROSIS 11/18/2007  . Allergic rhinitis 10/06/2007  . Hyperlipidemia with target LDL less than 70 07/19/2007  . GAD (generalized anxiety disorder) 07/19/2007  . Essential hypertension 07/19/2007  . CAD S/P percutaneous coronary angioplasty 07/19/2007  . VENOUS INSUFFICIENCY 07/19/2007  . Osteoarthritis 07/19/2007  . LOW BACK PAIN SYNDROME 07/19/2007   Past Medical History:  Diagnosis Date  . Adenomatous colon polyp   .  Allergy    seasonal  . Anemia    pt. denies  . Anxiety   . CAD (coronary artery disease)   . Cataract    cataracts removed left eye.  . Chronic lower back pain   . Diverticulosis   . GERD (gastroesophageal reflux disease)   . Headache(784.0)   . Hiatal hernia   . Hyperlipidemia   . Hypertension   . IBS (irritable bowel syndrome) 10/13/2011  . Osteoarthritis   . Osteoporosis   . Renal cysts, acquired, bilateral   . SBO (small bowel obstruction) ([place]) 09/05/2015  . Venous insufficiency     Family History  Problem Relation Age of Onset  . Heart disease Father   . Stroke Sister   . Colon cancer Neg Hx     Past Surgical History:  Procedure Laterality Date  . ABDOMINAL  HYSTERECTOMY    . BLADDER REPAIR    . CARDIAC CATHETERIZATION  01/12/2002   90% stenosis of the left circumflex, stented with a 3x37mm Cypher stent, resulting in reduction of 90% to 10%   . CARDIOVASCULAR STRESS TEST  03/22/2007   Mild inferolateral thinning toward the apex without significant ischemia. Nondiagnostic electrocardiogram.  . CATARACT EXTRACTION     bilateral  . COLONOSCOPY  08/17/2008   adenomatous polyp, diverticulosis, external hemorrhoids  . COLONOSCOPY W/ BIOPSIES     multiple   . LEFT LOWER EXTREMITY VENOUS DOPPLER  06/18/2011   No evidence of left lower extremity DVT  . TRANSTHORACIC ECHOCARDIOGRAM  11/18/2012   EF 50-53%, LV systolic mild-moderately reduced, mild-moderate mitral valve regurg, mild-moderate tricuspid valve regurg. NSR-LBBB with occas PVCs  . UPPER GASTROINTESTINAL ENDOSCOPY  07/03/2010   hiatal hernia  . VARICOSE VEIN SURGERY     Social History   Occupational History  . Occupation: retired  Tobacco Use  . Smoking status: Never Smoker  . Smokeless tobacco: Never Used  Substance and Sexual Activity  . Alcohol use: No    Alcohol/week: 0.0 oz  . Drug use: No  . Sexual activity: Not on file

## 2017-07-13 DIAGNOSIS — R351 Nocturia: Secondary | ICD-10-CM | POA: Diagnosis not present

## 2017-07-25 ENCOUNTER — Other Ambulatory Visit: Payer: Self-pay | Admitting: Internal Medicine

## 2017-08-06 ENCOUNTER — Ambulatory Visit: Payer: PPO | Admitting: Internal Medicine

## 2017-08-06 ENCOUNTER — Encounter

## 2017-08-28 ENCOUNTER — Ambulatory Visit: Payer: PPO | Admitting: Cardiovascular Disease

## 2017-08-28 ENCOUNTER — Encounter: Payer: Self-pay | Admitting: Cardiovascular Disease

## 2017-08-28 DIAGNOSIS — I10 Essential (primary) hypertension: Secondary | ICD-10-CM | POA: Diagnosis not present

## 2017-08-28 DIAGNOSIS — I2510 Atherosclerotic heart disease of native coronary artery without angina pectoris: Secondary | ICD-10-CM

## 2017-08-28 DIAGNOSIS — Z9861 Coronary angioplasty status: Secondary | ICD-10-CM | POA: Diagnosis not present

## 2017-08-28 DIAGNOSIS — E785 Hyperlipidemia, unspecified: Secondary | ICD-10-CM

## 2017-08-28 NOTE — Assessment & Plan Note (Signed)
History of CAD status post circumflex intervention by myself back in 2003 with a negative Myoview in 2008.  She walks in the gym an hour a day.  She denies chest pain or shortness of breath.

## 2017-08-28 NOTE — Assessment & Plan Note (Signed)
History of hyperlipidemia therapy lipid profile performed 04/28/2016 revealing an LDL 70 and HDL 44.

## 2017-08-28 NOTE — Assessment & Plan Note (Signed)
History of essential hypertension her blood pressure measured today at 147/85.  She is on amlodipine, hydrochlorothiazide, metoprolol and losartan.  Continue current meds at current dosing.

## 2017-08-28 NOTE — Patient Instructions (Signed)
Medication Instructions: Your physician recommends that you continue on your current medications as directed. Please refer to the Current Medication list given to you today.   Follow-Up: We request that you follow-up in: 6 months with Luke Kilroy, PA and in 12 months with Dr Berry  You will receive a reminder letter in the mail two months in advance. If you don't receive a letter, please call our office to schedule the follow-up appointment.  If you need a refill on your cardiac medications before your next appointment, please call your pharmacy.  

## 2017-08-28 NOTE — Progress Notes (Signed)
08/28/2017 Krystal [name]   [birth year]  [id number]  Primary Physician Janith Lima, MD Primary Cardiologist: Lorretta Harp MD Lupe Carney, [place]  HPI:  Krystal [name] is a 84 y.o.  mildly overweight married Caucasian female mother of 2 children, grandmother of 3 grandchildren, whose husband Krystal [name] is also a patient of mine. They were both patients of Dr. Georgiann Mccoy. I last saw her in the office  08/27/2016. Her past history is remarkable for treated hypertension and hyperlipidemia. Her father did die of a myocardial infarction. She has never had a heart attack or stroke. She does not smoke. She had a Cypher drug-eluting stent placed in her circumflex coronary artery by Dr. Eustace Quail in 2003. Her other arteries were normal as was her LV function. She denies chest pain or shortness of breath. She is very active and walks for an hour every morning. Since I saw her in the office a year ago she has seen Krystal [name] back 04/16/2017 and was doing well.  She apparently walks the gym an hour a day and is completely asymptomatic.  Her recent lipid profile performed 04/28/2017 revealed an LDL of 70 and HDL 44.    Current Meds  Medication Sig  . acetaminophen (TYLENOL) 500 MG tablet Take 500 mg by mouth every 4 (four) hours as needed for mild pain or headache.  . albuterol (PROVENTIL HFA;VENTOLIN HFA) 108 (90 Base) MCG/ACT inhaler Inhale 2 puffs into the lungs every 6 (six) hours as needed for wheezing or shortness of breath.  Marland Kitchen amLODipine (NORVASC) 10 MG tablet Take 1 tablet (10 mg total) by mouth daily.  Marland Kitchen aspirin EC 81 MG tablet Take 81 mg by mouth daily.  . calcium carbonate (OS-CAL) 600 MG TABS tablet Take 600 mg by mouth 2 (two) times daily with a meal.  . cholecalciferol (VITAMIN D) 1000 UNITS tablet Take 2,000 Units by mouth daily.   . diclofenac sodium (VOLTAREN) 1 % GEL APPLY 4 G TOPICALLY 4 (FOUR) TIMES DAILY.  . fluticasone (FLONASE) 50 MCG/ACT nasal spray Place 2 sprays  into both nostrils daily as needed.  Marland Kitchen guaiFENesin-codeine (ROBITUSSIN AC) 100-10 MG/5ML syrup Take 5 mLs by mouth 3 (three) times daily as needed for cough.  . hydrochlorothiazide (HYDRODIURIL) 25 MG tablet Take 0.5 tablets (12.5 mg total) by mouth daily.  . lansoprazole (PREVACID) 15 MG capsule TAKE 1 CAPSULE BY MOUTH TWICE A DAY BEFORE MEALS  . LORazepam (ATIVAN) 1 MG tablet Take 0.5 tablets (0.5 mg total) by mouth at bedtime as needed for anxiety.  Marland Kitchen losartan (COZAAR) 50 MG tablet Take 1 tablet (50 mg total) by mouth daily.  . meloxicam (MOBIC) 7.5 MG tablet TAKE 1 TABLET (7.5 MG TOTAL) BY MOUTH DAILY.  . methylPREDNISolone (MEDROL DOSEPAK) 4 MG TBPK tablet TAKE AS DIRECTED  . metoprolol succinate (TOPROL XL) 25 MG 24 hr tablet Take 1 tablet (25 mg total) by mouth daily.  . pregabalin (LYRICA) 75 MG capsule Take 1 capsule (75 mg total) by mouth at bedtime.  . Probiotic Product (ALIGN) 4 MG CAPS Take 4 mg by mouth daily.   . rosuvastatin (CRESTOR) 10 MG tablet Take 1 tablet (10 mg total) by mouth daily.     Allergies  Allergen Reactions  . Atorvastatin Other (See Comments)    Muscle aches  . Simvastatin Other (See Comments)    Myalgias   . Alendronate Sodium     REACTION: pt states INTOL to Fosamax w/ esophagitis  .  Cefdinir     REACTION: diarrhea  . Esomeprazole Magnesium     REACTION: pt states INTOL to Nexium  . Levofloxacin     REACTION: diarrhea  . Omeprazole     REACTION: pt states INTOL to Prilosec  . Other     Pneumonia vaccine---felt tired and achy and sick x 6 months to get over this.  . Penicillins Hives  . Shellfish-Derived Products Hives, Nausea Only and Rash    Social History   Socioeconomic History  . Marital status: Married    Spouse name: Krystal [name]  . Number of children: Not on file  . Years of education: Not on file  . Highest education level: Not on file  Occupational History  . Occupation: retired  Scientific laboratory technician  . Financial resource strain: Not on  file  . Food insecurity:    Worry: Not on file    Inability: Not on file  . Transportation needs:    Medical: Not on file    Non-medical: Not on file  Tobacco Use  . Smoking status: Never Smoker  . Smokeless tobacco: Never Used  Substance and Sexual Activity  . Alcohol use: No    Alcohol/week: 0.0 oz  . Drug use: No  . Sexual activity: Not on file  Lifestyle  . Physical activity:    Days per week: Not on file    Minutes per session: Not on file  . Stress: Not on file  Relationships  . Social connections:    Talks on phone: Not on file    Gets together: Not on file    Attends religious service: Not on file    Active member of club or organization: Not on file    Attends meetings of clubs or organizations: Not on file    Relationship status: Not on file  . Intimate partner violence:    Fear of current or ex partner: Not on file    Emotionally abused: Not on file    Physically abused: Not on file    Forced sexual activity: Not on file  Other Topics Concern  . Not on file  Social History Narrative  . Not on file     Review of Systems: General: negative for chills, fever, night sweats or weight changes.  Cardiovascular: negative for chest pain, dyspnea on exertion, edema, orthopnea, palpitations, paroxysmal nocturnal dyspnea or shortness of breath Dermatological: negative for rash Respiratory: negative for cough or wheezing Urologic: negative for hematuria Abdominal: negative for nausea, vomiting, diarrhea, bright red blood per rectum, melena, or hematemesis Neurologic: negative for visual changes, syncope, or dizziness All other systems reviewed and are otherwise negative except as noted above.    Blood pressure (!) 147/85, pulse 74, height 5\' 5"  (1.651 m), weight 157 lb (71.2 kg).  General appearance: alert and no distress Neck: no adenopathy, no carotid bruit, no JVD, supple, symmetrical, trachea midline and thyroid not enlarged, symmetric, no  tenderness/mass/nodules Lungs: clear to auscultation bilaterally Heart: regular rate and rhythm, S1, S2 normal, no murmur, click, rub or gallop Extremities: extremities normal, atraumatic, no cyanosis or edema Pulses: 2+ and symmetric Skin: Skin color, texture, turgor normal. No rashes or lesions Neurologic: Alert and oriented X 3, normal strength and tone. Normal symmetric reflexes. Normal coordination and gait  EKG sinus rhythm at 74 with left bundle branch block.  I personally reviewed this EKG.  ASSESSMENT AND PLAN:   Hyperlipidemia with target LDL less than 70 History of hyperlipidemia therapy lipid profile performed 04/28/2016 revealing  an LDL 70 and HDL 44.  Essential hypertension History of essential hypertension her blood pressure measured today at 147/85.  She is on amlodipine, hydrochlorothiazide, metoprolol and losartan.  Continue current meds at current dosing.  CAD S/P percutaneous coronary angioplasty History of CAD status post circumflex intervention by myself back in 2003 with a negative Myoview in 2008.  She walks in the gym an hour a day.  She denies chest pain or shortness of breath.      Lorretta Harp MD FACP,FACC,FAHA, [facility] 08/28/2017 10:36 AM

## 2017-09-01 ENCOUNTER — Ambulatory Visit: Payer: PPO | Admitting: Internal Medicine

## 2017-09-02 ENCOUNTER — Ambulatory Visit (INDEPENDENT_AMBULATORY_CARE_PROVIDER_SITE_OTHER): Payer: PPO | Admitting: Internal Medicine

## 2017-09-02 ENCOUNTER — Encounter: Payer: Self-pay | Admitting: Internal Medicine

## 2017-09-02 VITALS — BP 124/74 | HR 73 | Temp 98.2°F | Ht 65.0 in | Wt 155.0 lb

## 2017-09-02 DIAGNOSIS — J0111 Acute recurrent frontal sinusitis: Secondary | ICD-10-CM

## 2017-09-02 DIAGNOSIS — Z9109 Other allergy status, other than to drugs and biological substances: Secondary | ICD-10-CM | POA: Diagnosis not present

## 2017-09-02 DIAGNOSIS — J301 Allergic rhinitis due to pollen: Secondary | ICD-10-CM | POA: Diagnosis not present

## 2017-09-02 DIAGNOSIS — M150 Primary generalized (osteo)arthritis: Secondary | ICD-10-CM | POA: Diagnosis not present

## 2017-09-02 DIAGNOSIS — M159 Polyosteoarthritis, unspecified: Secondary | ICD-10-CM

## 2017-09-02 MED ORDER — DICLOFENAC SODIUM 1 % TD GEL: 4.0000 g | Freq: Four times a day (QID) | TRANSDERMAL | 2 refills | Status: DC

## 2017-09-02 MED ORDER — AZITHROMYCIN 250 MG PO TABS: ORAL_TABLET | ORAL | 0 refills | Status: DC

## 2017-09-02 MED ORDER — METHYLPREDNISOLONE ACETATE 80 MG/ML IJ SUSP
80.0000 mg | Freq: Once | INTRAMUSCULAR | Status: AC
  Administered 2017-09-02: 80 mg via INTRAMUSCULAR

## 2017-09-02 MED ORDER — PREDNISONE 10 MG PO TABS: ORAL_TABLET | ORAL | 0 refills | Status: DC

## 2017-09-02 NOTE — Progress Notes (Signed)
Subjective:    Patient ID: Krystal [name], female    DOB: February 21, 1930, 84 y.o.   MRN: [id number]  HPI   Here with 2-3 days acute onset fever, facial pain, pressure, headache, general weakness and malaise, and greenish d/c, with mild ST and cough, but pt denies chest pain, wheezing, increased sob or doe, orthopnea, PND, increased LE swelling, palpitations, dizziness or syncope.  Does have several wks ongoing nasal allergy symptoms with clearish congestion, itch and sneezing, without fever, pain, ST, cough, swelling or wheezing, and even bilat eye puffiness and watery as well.  Also has significant pain and aching to the [facility] bilat thumbs, asking for tx Past Medical History:  Diagnosis Date  . Adenomatous colon polyp   . Allergy    seasonal  . Anemia    pt. denies  . Anxiety   . CAD (coronary artery disease)   . Cataract    cataracts removed left eye.  . Chronic lower back pain   . Diverticulosis   . GERD (gastroesophageal reflux disease)   . Headache(784.0)   . Hiatal hernia   . Hyperlipidemia   . Hypertension   . IBS (irritable bowel syndrome) 10/13/2011  . Osteoarthritis   . Osteoporosis   . Renal cysts, acquired, bilateral   . SBO (small bowel obstruction) ([place]) 09/05/2015  . Venous insufficiency    Past Surgical History:  Procedure Laterality Date  . ABDOMINAL HYSTERECTOMY    . BLADDER REPAIR    . CARDIAC CATHETERIZATION  01/12/2002   90% stenosis of the left circumflex, stented with a 3x36mm Cypher stent, resulting in reduction of 90% to 10%   . CARDIOVASCULAR STRESS TEST  03/22/2007   Mild inferolateral thinning toward the apex without significant ischemia. Nondiagnostic electrocardiogram.  . CATARACT EXTRACTION     bilateral  . COLONOSCOPY  08/17/2008   adenomatous polyp, diverticulosis, external hemorrhoids  . COLONOSCOPY W/ BIOPSIES     multiple   . LEFT LOWER EXTREMITY VENOUS DOPPLER  06/18/2011   No evidence of left lower extremity DVT  . TRANSTHORACIC  ECHOCARDIOGRAM  11/18/2012   EF 62-83%, LV systolic mild-moderately reduced, mild-moderate mitral valve regurg, mild-moderate tricuspid valve regurg. NSR-LBBB with occas PVCs  . UPPER GASTROINTESTINAL ENDOSCOPY  07/03/2010   hiatal hernia  . VARICOSE VEIN SURGERY      reports that she has never smoked. She has never used smokeless tobacco. She reports that she does not drink alcohol or use drugs. family history includes Heart disease in her father; Stroke in her sister. Allergies  Allergen Reactions  . Atorvastatin Other (See Comments)    Muscle aches  . Simvastatin Other (See Comments)    Myalgias   . Alendronate Sodium     REACTION: pt states INTOL to Fosamax w/ esophagitis  . Cefdinir     REACTION: diarrhea  . Esomeprazole Magnesium     REACTION: pt states INTOL to Nexium  . Levofloxacin     REACTION: diarrhea  . Omeprazole     REACTION: pt states INTOL to Prilosec  . Other     Pneumonia vaccine---felt tired and achy and sick x 6 months to get over this.  . Penicillins Hives  . Shellfish-Derived Products Hives, Nausea Only and Rash   Current Outpatient Medications on File Prior to Visit  Medication Sig Dispense Refill  . acetaminophen (TYLENOL) 500 MG tablet Take 500 mg by mouth every 4 (four) hours as needed for mild pain or headache.    . albuterol (PROVENTIL  HFA;VENTOLIN HFA) 108 (90 Base) MCG/ACT inhaler Inhale 2 puffs into the lungs every 6 (six) hours as needed for wheezing or shortness of breath. 1 Inhaler 2  . amLODipine (NORVASC) 10 MG tablet Take 1 tablet (10 mg total) by mouth daily. 90 tablet 3  . aspirin EC 81 MG tablet Take 81 mg by mouth daily.    . calcium carbonate (OS-CAL) 600 MG TABS tablet Take 600 mg by mouth 2 (two) times daily with a meal.    . cholecalciferol (VITAMIN D) 1000 UNITS tablet Take 2,000 Units by mouth daily.     . diclofenac sodium (VOLTAREN) 1 % GEL APPLY 4 G TOPICALLY 4 (FOUR) TIMES DAILY. 200 g 2  . fluticasone (FLONASE) 50 MCG/ACT nasal  spray Place 2 sprays into both nostrils daily as needed. 16 g 5  . lansoprazole (PREVACID) 15 MG capsule TAKE 1 CAPSULE BY MOUTH TWICE A DAY BEFORE MEALS 180 capsule 0  . LORazepam (ATIVAN) 1 MG tablet Take 0.5 tablets (0.5 mg total) by mouth at bedtime as needed for anxiety. 30 tablet 3  . losartan (COZAAR) 50 MG tablet Take 1 tablet (50 mg total) by mouth daily. 90 tablet 3  . meloxicam (MOBIC) 7.5 MG tablet TAKE 1 TABLET (7.5 MG TOTAL) BY MOUTH DAILY. 30 tablet 1  . metoprolol succinate (TOPROL XL) 25 MG 24 hr tablet Take 1 tablet (25 mg total) by mouth daily. 90 tablet 3  . pregabalin (LYRICA) 75 MG capsule Take 1 capsule (75 mg total) by mouth at bedtime. 56 capsule 0  . Probiotic Product (ALIGN) 4 MG CAPS Take 4 mg by mouth daily.     . rosuvastatin (CRESTOR) 10 MG tablet Take 1 tablet (10 mg total) by mouth daily. 90 tablet 3  . hydrochlorothiazide (HYDRODIURIL) 25 MG tablet Take 0.5 tablets (12.5 mg total) by mouth daily. 30 tablet 0   No current facility-administered medications on file prior to visit.    Review of Systems  Constitutional: Negative for other unusual diaphoresis or sweats HENT: Negative for ear discharge or swelling Eyes: Negative for other worsening visual disturbances Respiratory: Negative for stridor or other swelling  Gastrointestinal: Negative for worsening distension or other blood Genitourinary: Negative for retention or other urinary change Musculoskeletal: Negative for other MSK pain or swelling Skin: Negative for color change or other new lesions Neurological: Negative for worsening tremors and other numbness  Psychiatric/Behavioral: Negative for worsening agitation or other fatigue All other system neg per pt    Objective:   Physical Exam BP 124/74   Pulse 73   Temp 98.2 F (36.8 C) (Oral)   Ht 5\' 5"  (1.651 m)   Wt 155 lb (70.3 kg)   SpO2 94%   BMI 25.79 kg/m  VS noted, mld ill Constitutional: Pt appears in NAD HENT: Head: NCAT.  Right Ear:  External ear normal.  Left Ear: External ear normal.  Bilat tm's with mild erythema.  Max sinus areas mild tender.  Pharynx with mild erythema, no exudate Eyes: . Pupils are equal, round, and reactive to light. Conjunctivae and upper/lower lids with erythema and weepiness and EOM are normal Nose: without d/c or deformity Neck: Neck supple. Gross normal ROM Cardiovascular: Normal rate and regular rhythm.   Pulmonary/Chest: Effort normal and breath sounds without rales or wheezing.  Bilat CMC joints with marked degenerative changes Neurological: Pt is alert. At baseline orientation, motor grossly intact Skin: Skin is warm. No rashes, other new lesions, no LE edema Psychiatric: Pt behavior  is normal without agitation  No other exam findings     Assessment & Plan:

## 2017-09-02 NOTE — Assessment & Plan Note (Signed)
Mild to mod, for depomedrol IM 80, for predpac asd,  to f/u any worsening symptoms or concerns

## 2017-09-02 NOTE — Assessment & Plan Note (Signed)
Mild to mod, for antibx course,  to f/u any worsening symptoms or concerns 

## 2017-09-02 NOTE — Patient Instructions (Signed)
You had the steroid shot today  Please take all new medication as prescribed - the prednisone, and antibiotic, as well as the voltaren gel as needed for pain  You can also take Delsym OTC for cough, and/or Mucinex (or it's generic off brand) for congestion, and tylenol as needed for pain.  Please continue all other medications as before, and refills have been done if requested.  Please have the pharmacy call with any other refills you may need.  Please keep your appointments with your specialists as you may have planned

## 2017-09-02 NOTE — Assessment & Plan Note (Signed)
[place] for trial volt gel prn,  to f/u any worsening symptoms or concerns

## 2017-09-15 ENCOUNTER — Telehealth: Payer: Self-pay | Admitting: Emergency Medicine

## 2017-09-15 NOTE — Telephone Encounter (Signed)
Called patient to schedule AWV. Pt declined at this time. 

## 2017-09-25 IMAGING — DX DG ABDOMEN 2V
2 series · 2 of 2 positions shown · non-contrast
Comparison: 09/05/2015.

CLINICAL DATA: Evaluate for possible ileus.

EXAM:
ABDOMEN - 2 VIEW

[abdomen erect]
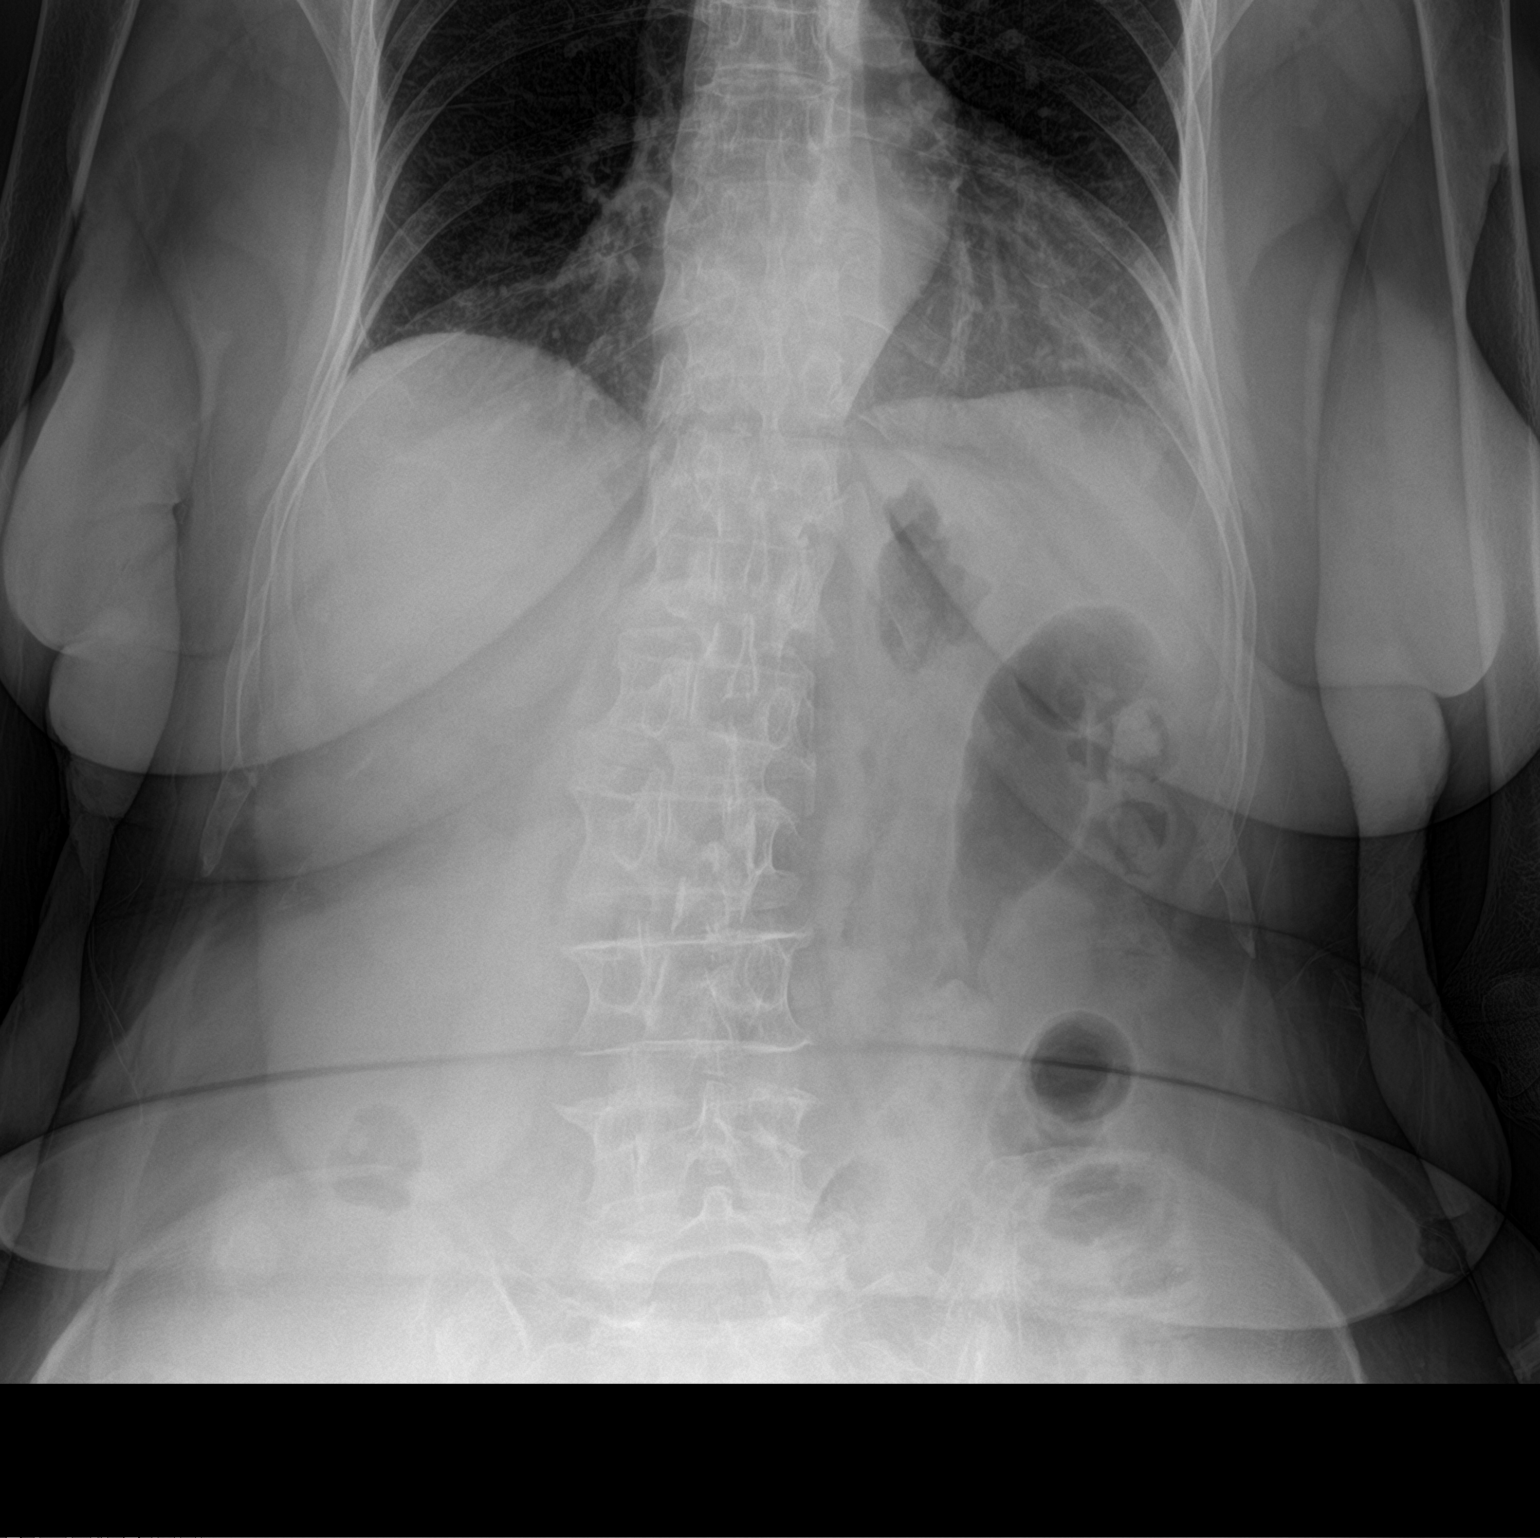

[abdomen supine]
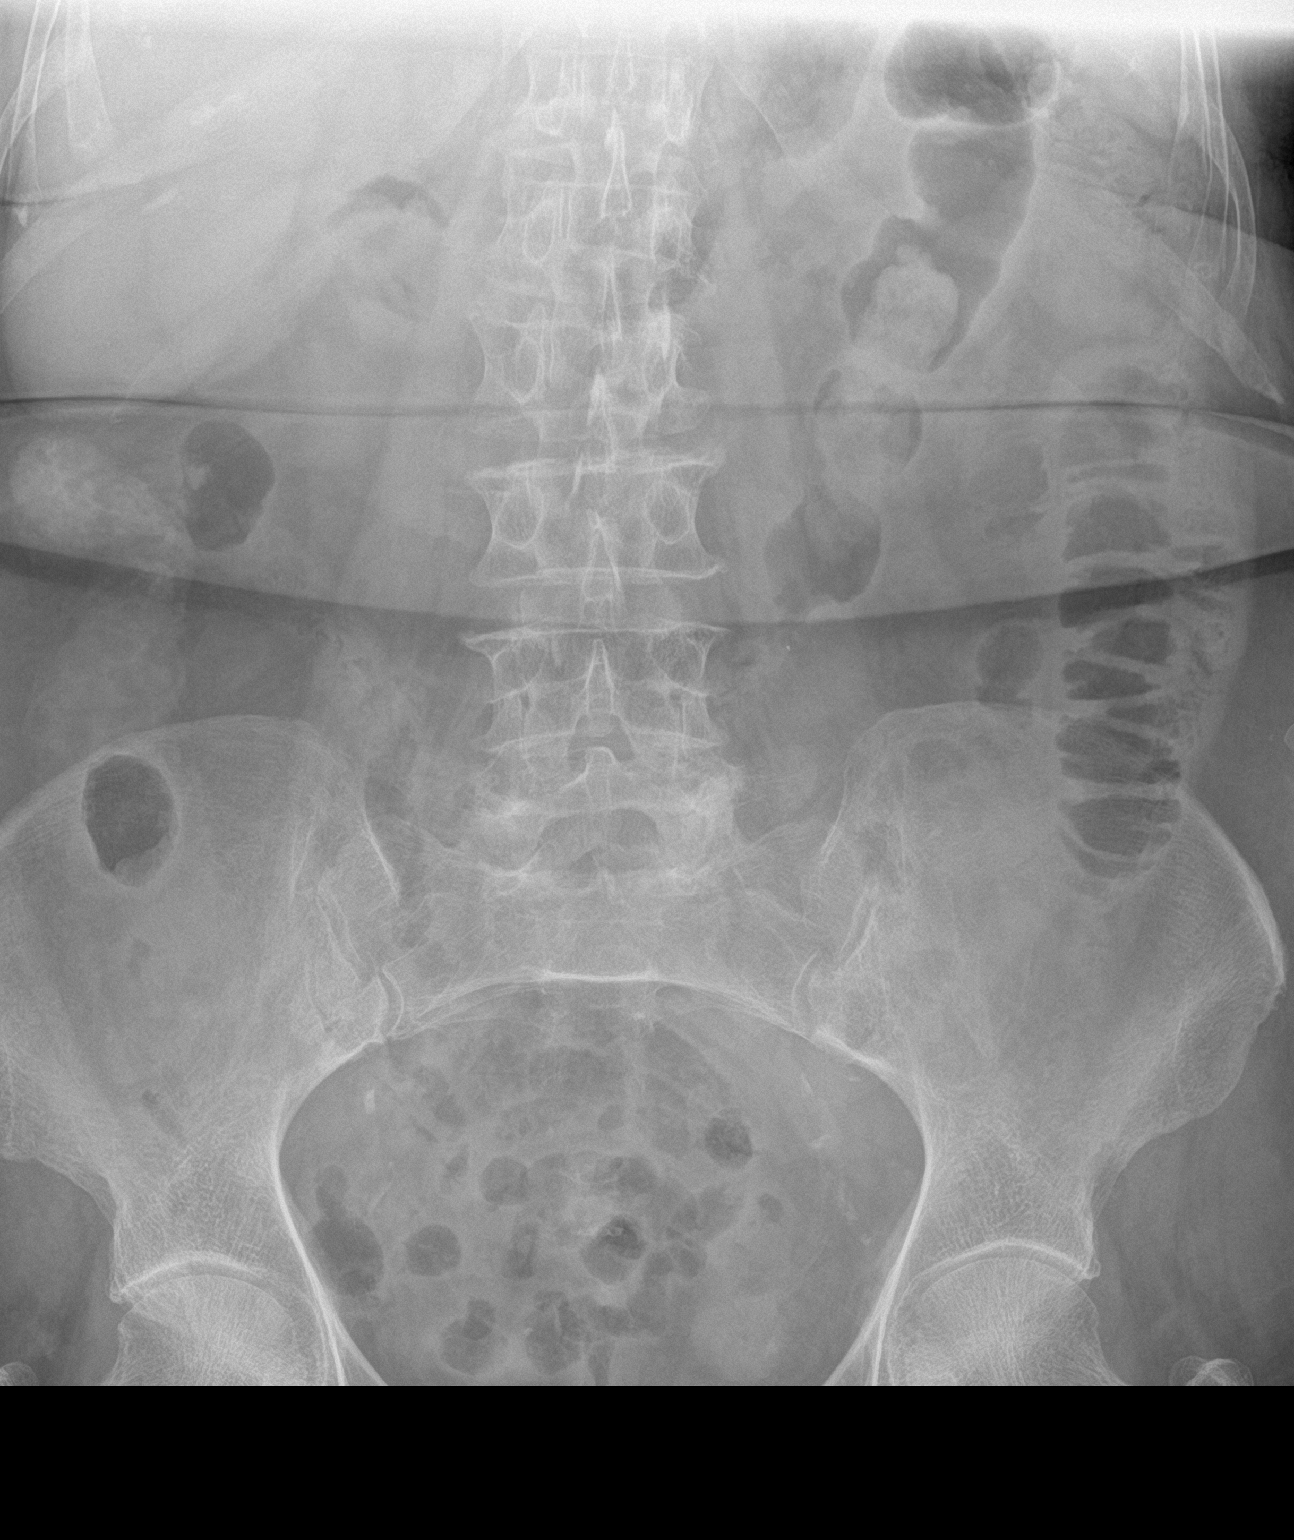

[2 of 2 positions shown; findings below may reference images not displayed]

FINDINGS: Again noted is an air-filled loop of small bowel which measures up
to 3 cm. This may reflect focal ileus or partial obstruction. The
remaining small bowel loops have a normal caliber. No significant
fluid levels. Gas and stool noted throughout the colon.
IMPRESSION: 1. Persistently dilated left lower quadrant small bowel loop
compatible with focal ileus or partial obstruction.

## 2017-09-29 ENCOUNTER — Ambulatory Visit (INDEPENDENT_AMBULATORY_CARE_PROVIDER_SITE_OTHER): Payer: PPO | Admitting: Orthopedic Surgery

## 2017-10-07 ENCOUNTER — Ambulatory Visit (INDEPENDENT_AMBULATORY_CARE_PROVIDER_SITE_OTHER): Payer: PPO | Admitting: Orthopedic Surgery

## 2017-10-07 ENCOUNTER — Encounter (INDEPENDENT_AMBULATORY_CARE_PROVIDER_SITE_OTHER): Payer: Self-pay | Admitting: Orthopedic Surgery

## 2017-10-07 DIAGNOSIS — M25572 Pain in left ankle and joints of left foot: Secondary | ICD-10-CM | POA: Diagnosis not present

## 2017-10-07 DIAGNOSIS — M25571 Pain in right ankle and joints of right foot: Secondary | ICD-10-CM

## 2017-10-07 DIAGNOSIS — G8929 Other chronic pain: Secondary | ICD-10-CM | POA: Diagnosis not present

## 2017-10-07 MED ORDER — GABAPENTIN 100 MG PO CAPS: ORAL_CAPSULE | ORAL | 0 refills | Status: DC

## 2017-10-07 NOTE — Progress Notes (Signed)
Office Visit Note   Patient: Krystal [name]           Date of Birth: Dec 19, 1929           MRN: [id number] Visit Date: 10/07/2017 Requested by: Janith Lima, MD [address], [place] [address] PCP: Janith Lima, MD  Subjective: Chief Complaint  Patient presents with  . burning/tightness in both ankles at night    HPI: Krystal [name] is a patient with 87-month history of bilateral ankle pain and aching at night.  She describes a tightness and burning sensation and drawing sensation which really only occurs about 4 hours after she goes to sleep.  She walks 45 minutes a day and has no problem with that.  She had been having some cramps in her left calf but takes a baby aspirin and is tried over-the-counter cramp medication which helps.  She does describe a burning type sensation.              ROS: All systems reviewed are negative as they relate to the chief complaint within the history of present illness.  Patient denies  fevers or chills.   Assessment & Plan: Visit Diagnoses:  1. Chronic pain of both ankles     Plan: Impression is bilateral ankle pain with no real effusion no restriction of range of motion no tenderness of any of the crossing tendons.  The burning nature of the pain as well as how it wakes her up from sleep at night looks like it may be more neuropathy related.  This is not vascular claudication as she has excellent pulses.  Does not look like neurogenic claudication.  Described to her that even though it is an off label use she may benefit from 1 dose of Neurontin to be taken at night.  We will try that for a month and if that does not help then she may want to be worked up for some type of restless leg syndrome  Follow-Up Instructions: Return if symptoms worsen or fail to improve.   Orders:  No orders of the defined types were placed in this encounter.  Meds ordered this encounter  Medications  . gabapentin (NEURONTIN) 100 MG capsule    Sig: 1 po  qhs    Dispense:  30 capsule    Refill:  0      Procedures: No procedures performed   Clinical Data: No additional findings.  Objective: Vital Signs: There were no vitals taken for this visit.  Physical Exam:   Constitutional: Patient appears well-developed HEENT:  Head: Normocephalic Eyes:EOM are normal Neck: Normal range of motion Cardiovascular: Normal rate Pulmonary/chest: Effort normal Neurologic: Patient is alert Skin: Skin is warm Psychiatric: Patient has normal mood and affect    Ortho Exam: Ortho exam demonstrates normal gait and alignment.  Palpable intact nontender anterior to posterior tib peroneal and Achilles tendons.  No synovitis or effusion in either ankle.  Ankle range of motion is full along with subtalar transverse tarsal range of motion.  Patient has intact sensation on the dorsal and plantar aspect of the feet.  No calf tenderness bilaterally.  No other masses lymphadenopathy or skin changes noted in the foot or ankle region.  Specialty Comments:  No specialty comments available.  Imaging: No results found.   PMFS History: Patient Active Problem List   Diagnosis Date Noted  . Acute recurrent frontal sinusitis 05/20/2017  . LBBB (left bundle branch block) 07/16/2016  . Cardiomyopathy ([place])  07/16/2016  . Benign paroxysmal positional vertigo 01/17/2016  . Hyperglycemia 05/23/2015  . IBS (irritable bowel syndrome) 10/13/2011  . GERD (gastroesophageal reflux disease) 06/25/2010  . RENAL CYST 11/18/2007  . OSTEOPOROSIS 11/18/2007  . Allergic rhinitis 10/06/2007  . Hyperlipidemia with target LDL less than 70 07/19/2007  . GAD (generalized anxiety disorder) 07/19/2007  . Essential hypertension 07/19/2007  . CAD S/P percutaneous coronary angioplasty 07/19/2007  . VENOUS INSUFFICIENCY 07/19/2007  . Osteoarthritis 07/19/2007  . LOW BACK PAIN SYNDROME 07/19/2007   Past Medical History:  Diagnosis Date  . Adenomatous colon polyp   . Allergy     seasonal  . Anemia    pt. denies  . Anxiety   . CAD (coronary artery disease)   . Cataract    cataracts removed left eye.  . Chronic lower back pain   . Diverticulosis   . GERD (gastroesophageal reflux disease)   . Headache(784.0)   . Hiatal hernia   . Hyperlipidemia   . Hypertension   . IBS (irritable bowel syndrome) 10/13/2011  . Osteoarthritis   . Osteoporosis   . Renal cysts, acquired, bilateral   . SBO (small bowel obstruction) ([place]) 09/05/2015  . Venous insufficiency     Family History  Problem Relation Age of Onset  . Heart disease Father   . Stroke Sister   . Colon cancer Neg Hx     Past Surgical History:  Procedure Laterality Date  . ABDOMINAL HYSTERECTOMY    . BLADDER REPAIR    . CARDIAC CATHETERIZATION  01/12/2002   90% stenosis of the left circumflex, stented with a 3x82mm Cypher stent, resulting in reduction of 90% to 10%   . CARDIOVASCULAR STRESS TEST  03/22/2007   Mild inferolateral thinning toward the apex without significant ischemia. Nondiagnostic electrocardiogram.  . CATARACT EXTRACTION     bilateral  . COLONOSCOPY  08/17/2008   adenomatous polyp, diverticulosis, external hemorrhoids  . COLONOSCOPY W/ BIOPSIES     multiple   . LEFT LOWER EXTREMITY VENOUS DOPPLER  06/18/2011   No evidence of left lower extremity DVT  . TRANSTHORACIC ECHOCARDIOGRAM  11/18/2012   EF 94-85%, LV systolic mild-moderately reduced, mild-moderate mitral valve regurg, mild-moderate tricuspid valve regurg. NSR-LBBB with occas PVCs  . UPPER GASTROINTESTINAL ENDOSCOPY  07/03/2010   hiatal hernia  . VARICOSE VEIN SURGERY     Social History   Occupational History  . Occupation: retired  Tobacco Use  . Smoking status: Never Smoker  . Smokeless tobacco: Never Used  Substance and Sexual Activity  . Alcohol use: No    Alcohol/week: 0.0 oz  . Drug use: No  . Sexual activity: Not on file

## 2017-10-19 ENCOUNTER — Encounter: Payer: Self-pay | Admitting: Internal Medicine

## 2017-10-19 ENCOUNTER — Ambulatory Visit (INDEPENDENT_AMBULATORY_CARE_PROVIDER_SITE_OTHER): Payer: PPO | Admitting: Internal Medicine

## 2017-10-19 ENCOUNTER — Other Ambulatory Visit (INDEPENDENT_AMBULATORY_CARE_PROVIDER_SITE_OTHER): Payer: PPO

## 2017-10-19 VITALS — BP 144/78 | HR 68 | Temp 97.9°F | Ht 65.0 in | Wt 152.8 lb

## 2017-10-19 DIAGNOSIS — K5904 Chronic idiopathic constipation: Secondary | ICD-10-CM

## 2017-10-19 DIAGNOSIS — I10 Essential (primary) hypertension: Secondary | ICD-10-CM

## 2017-10-19 DIAGNOSIS — R252 Cramp and spasm: Secondary | ICD-10-CM

## 2017-10-19 DIAGNOSIS — M159 Polyosteoarthritis, unspecified: Secondary | ICD-10-CM

## 2017-10-19 DIAGNOSIS — M150 Primary generalized (osteo)arthritis: Secondary | ICD-10-CM

## 2017-10-19 LAB — CK: Total CK: 89 U/L (ref 7–177)

## 2017-10-19 LAB — BASIC METABOLIC PANEL
BUN: 20 mg/dL (ref 6–23)
CALCIUM: 9.5 mg/dL (ref 8.4–10.5)
CO2: 30 mEq/L (ref 19–32)
Chloride: 101 mEq/L (ref 96–112)
Creatinine, Ser: 0.92 mg/dL (ref 0.40–1.20)
GFR: 61.56 mL/min (ref >60.00)
Glucose, Bld: 99 mg/dL (ref 70–99)
Potassium: 3.9 mEq/L (ref 3.5–5.1)
SODIUM: 139 meq/L (ref 135–145)

## 2017-10-19 LAB — VITAMIN D 25 HYDROXY (VIT D DEFICIENCY, FRACTURES): VITD: 55.36 ng/mL (ref 30.00–100.00)

## 2017-10-19 LAB — MAGNESIUM: Magnesium: 2.2 mg/dL (ref 1.5–2.5)

## 2017-10-19 MED ORDER — LINACLOTIDE 72 MCG PO CAPS: 72.0000 ug | ORAL_CAPSULE | Freq: Every day | ORAL | 1 refills | Status: DC

## 2017-10-19 MED ORDER — TRAMADOL HCL 50 MG PO TABS: 50.0000 mg | ORAL_TABLET | Freq: Every evening | ORAL | 1 refills | Status: DC | PRN

## 2017-10-19 NOTE — Progress Notes (Signed)
Subjective:  Patient ID: Krystal [name], female    DOB: Jul 25, 1929  Age: 84 y.o. MRN: [id number]  CC: Hypertension   HPI Krystal [name] presents for f/up - She complains of a several month history of foot cramps at night.  She denies claudication during the day.  She says the cramps keep her awake at night.  She also complains of pain in her  joints including her ankles, knees, and hips.  She has tried to treat the nocturnal cramps with a tablespoon of yellow mustard and a magnesium supplement.  Neither 1 of these have helped.  She also complains of constipation that has not responded to over-the-counter remedies such as laxatives and stimulants.  Outpatient Medications Prior to Visit  Medication Sig Dispense Refill  . acetaminophen (TYLENOL) 500 MG tablet Take 500 mg by mouth every 4 (four) hours as needed for mild pain or headache.    . albuterol (PROVENTIL HFA;VENTOLIN HFA) 108 (90 Base) MCG/ACT inhaler Inhale 2 puffs into the lungs every 6 (six) hours as needed for wheezing or shortness of breath. 1 Inhaler 2  . amLODipine (NORVASC) 10 MG tablet Take 1 tablet (10 mg total) by mouth daily. 90 tablet 3  . aspirin EC 81 MG tablet Take 81 mg by mouth daily.    . calcium carbonate (OS-CAL) 600 MG TABS tablet Take 600 mg by mouth 2 (two) times daily with a meal.    . cholecalciferol (VITAMIN D) 1000 UNITS tablet Take 2,000 Units by mouth daily.     . lansoprazole (PREVACID) 15 MG capsule TAKE 1 CAPSULE BY MOUTH TWICE A DAY BEFORE MEALS 180 capsule 0  . LORazepam (ATIVAN) 1 MG tablet Take 0.5 tablets (0.5 mg total) by mouth at bedtime as needed for anxiety. 30 tablet 3  . losartan (COZAAR) 50 MG tablet Take 1 tablet (50 mg total) by mouth daily. 90 tablet 3  . metoprolol succinate (TOPROL XL) 25 MG 24 hr tablet Take 1 tablet (25 mg total) by mouth daily. 90 tablet 3  . Probiotic Product (ALIGN) 4 MG CAPS Take 4 mg by mouth daily.     . rosuvastatin (CRESTOR) 10 MG tablet Take 1 tablet (10 mg  total) by mouth daily. 90 tablet 3  . hydrochlorothiazide (HYDRODIURIL) 25 MG tablet Take 0.5 tablets (12.5 mg total) by mouth daily. 30 tablet 0  . azithromycin (ZITHROMAX Z-PAK) 250 MG tablet 2 tab by mouth day 1, then 1 per day 6 tablet 0  . diclofenac sodium (VOLTAREN) 1 % GEL Apply 4 g topically 4 (four) times daily. 200 g 2  . fluticasone (FLONASE) 50 MCG/ACT nasal spray Place 2 sprays into both nostrils daily as needed. 16 g 5  . gabapentin (NEURONTIN) 100 MG capsule 1 po qhs 30 capsule 0  . meloxicam (MOBIC) 7.5 MG tablet TAKE 1 TABLET (7.5 MG TOTAL) BY MOUTH DAILY. 30 tablet 1  . predniSONE (DELTASONE) 10 MG tablet 3 tabs by mouth per day for 3 days,2tabs per day for 3 days,1tab per day for 3 days 18 tablet 0  . pregabalin (LYRICA) 75 MG capsule Take 1 capsule (75 mg total) by mouth at bedtime. 56 capsule 0   No facility-administered medications prior to visit.     ROS Review of Systems  Constitutional: Negative for diaphoresis and fatigue.  HENT: Negative.   Respiratory: Negative.  Negative for cough, chest tightness, shortness of breath and wheezing.   Cardiovascular: Negative for chest pain, palpitations and leg swelling.  Gastrointestinal: Positive for constipation. Negative for abdominal pain, blood in stool, nausea and vomiting.  Endocrine: Negative.   Genitourinary: Negative.  Negative for difficulty urinating.  Musculoskeletal: Positive for arthralgias. Negative for back pain, myalgias and neck pain.  Skin: Negative.  Negative for color change and rash.  Allergic/Immunologic: Negative.   Neurological: Negative.  Negative for dizziness, weakness, light-headedness and headaches.  Hematological: Negative for adenopathy. Does not bruise/bleed easily.  Psychiatric/Behavioral: Negative.     Objective:  BP (!) 144/78 (BP Location: Left Arm, Patient Position: Sitting, Cuff Size: Normal)   Pulse 68   Temp 97.9 F (36.6 C) (Oral)   Ht 5\' 5"  (1.651 m)   Wt 152 lb 12 oz (69.3  kg)   SpO2 96%   BMI 25.42 kg/m   BP Readings from Last 3 Encounters:  10/19/17 (!) 144/78  09/02/17 124/74  08/28/17 (!) 147/85    Wt Readings from Last 3 Encounters:  10/19/17 152 lb 12 oz (69.3 kg)  09/02/17 155 lb (70.3 kg)  08/28/17 157 lb (71.2 kg)    Physical Exam  Constitutional: She is oriented to person, place, and time. No distress.  HENT:  Mouth/Throat: Oropharynx is clear and moist. No oropharyngeal exudate.  Eyes: Conjunctivae are normal. No scleral icterus.  Neck: Normal range of motion. Neck supple. No thyromegaly present.  Cardiovascular: Normal rate, regular rhythm and normal heart sounds. Exam reveals no gallop and no friction rub.  No murmur heard. Pulses:      Carotid pulses are 1+ on the right side, and 1+ on the left side.      Radial pulses are 1+ on the right side, and 1+ on the left side.       Femoral pulses are 1+ on the right side, and 1+ on the left side.      Popliteal pulses are 1+ on the right side, and 1+ on the left side.       Dorsalis pedis pulses are 1+ on the right side, and 1+ on the left side.       Posterior tibial pulses are 1+ on the right side, and 1+ on the left side.  Pulmonary/Chest: Effort normal and breath sounds normal. She has no wheezes. She has no rales.  Abdominal: Soft. Normal appearance and bowel sounds are normal. She exhibits no mass. There is no hepatosplenomegaly. There is no tenderness. No hernia.  Musculoskeletal: Normal range of motion. She exhibits no edema, tenderness or deformity.  There is evidence of DJD in her ankles and knees but there is no joint swelling, tenderness, or erythema.  Lymphadenopathy:    She has no cervical adenopathy.  Neurological: She is alert and oriented to person, place, and time.  Skin: Skin is warm and dry. She is not diaphoretic. No erythema. No pallor.  Vitals reviewed.   Lab Results  Component Value Date   WBC 9.3 01/17/2016   HGB 14.9 01/17/2016   HCT 44.5 01/17/2016   PLT  199.0 01/17/2016   GLUCOSE 99 10/19/2017   CHOL 137 04/28/2017   TRIG 115 04/28/2017   HDL 44 04/28/2017   LDLDIRECT 160.4 12/19/2008   LDLCALC 70 04/28/2017   ALT 14 04/28/2017   AST 20 04/28/2017   NA 139 10/19/2017   K 3.9 10/19/2017   CL 101 10/19/2017   CREATININE 0.92 10/19/2017   BUN 20 10/19/2017   CO2 30 10/19/2017   TSH 1.56 05/23/2015   HGBA1C 5.9 05/23/2015    Dg Bone Density (dxa)  Result Date: 04/15/2017 EXAM: DUAL X-RAY ABSORPTIOMETRY (DXA) FOR BONE MINERAL DENSITY IMPRESSION: Referring Physician:  Avon Gully PATIENT: Name: Krystal [name], Krystal [name] Patient ID: [id number] Birth Date: 11/05/1929 Height: 65.0 in. Sex: Female Measured: 04/15/2017 Weight: 155.5 lbs. Indications: Advanced Age, Caucasian, Estrogen Deficient, Height Loss (781.91), Hysterectomy, osteoarthiritis, Osteoporosis (733), Postmenopausal Fractures: None Treatments: Calcium (E943.0), Premarin, Prolia, Vitamin D (E933.5) ASSESSMENT: The BMD measured at Femur Total Left is 0.623 g/cm2 with a T-score of -3.1. This patient is considered osteoporotic according to [facility]) criteria. There has been no statistically significant change in BMD of Lumbar spine, or of Dual Femur hips since prior exam dated 03/30/2015. Site Region Measured Date Measured Age YA BMD Significant CHANGE T-score DualFemur Total Left 04/15/2017    85.2         -3.1    0.623 g/cm2 AP Spine  L1-L4      04/15/2017    85.2         -2.9    0.835 g/cm2 World [employer] [facility]) criteria for post-menopausal, Caucasian Women: Normal       T-score at or above -1 SD Osteopenia   T-score between -1 and -2.5 SD Osteoporosis T-score at or below -2.5 SD RECOMMENDATION: [place] recommends that FDA-approved medical therapies be considered in postmenopausal women and men age 42 or older with a: 1. Hip or vertebral (clinical or morphometric) fracture. 2. T-score of <-2.5 at the spine or hip. 3. Ten-year fracture  probability by FRAX of 3% or greater for hip fracture or 20% or greater for major osteoporotic fracture. All treatment decisions require clinical judgment and consideration of individual patient factors, including patient preferences, co-morbidities, previous drug use, risk factors not captured in the FRAX model (e.g. falls, vitamin D deficiency, increased bone turnover, interval significant decline in bone density) and possible under - or over-estimation of fracture risk by FRAX. All patients should ensure an adequate intake of dietary calcium (1200 mg/d) and vitamin D (800 IU daily) unless contraindicated. FOLLOW-UP: People with diagnosed cases of osteoporosis or at high risk for fracture should have regular bone mineral density tests. For patients eligible for Medicare, routine testing is allowed once every 2 years. The testing frequency can be increased to one year for patients who have rapidly progressing disease, those who are receiving or discontinuing medical therapy to restore bone mass, or have additional risk factors. I have reviewed this report, and agree with the above findings. [facility] Radiology Electronically Signed   By: Marin Olp M.D.   On: 04/15/2017 09:58    Assessment & Plan:   Krystal [name] was seen today for hypertension.  Diagnoses and all orders for this visit:  Primary osteoarthritis involving multiple joints -     traMADol (ULTRAM) 50 MG tablet; Take 1 tablet (50 mg total) by mouth at bedtime as needed.  Essential hypertension- Her blood pressure is well controlled.  Electrolytes and renal function are normal. -     Magnesium; Future -     Basic metabolic panel; Future -     VITAMIN D 25 Hydroxy (Vit-D Deficiency, Fractures); Future  Nocturnal foot cramps- There is no evidence of arterial insufficiency or myopathy and her lab work is negative for secondary causes.  Will control the symptoms with a dose of tramadol at bedtime. -     CK; Future -     traMADol (ULTRAM) 50 MG  tablet; Take 1 tablet (50 mg total) by mouth at bedtime as needed.  Chronic idiopathic  constipation- Her lab work is negative for secondary metabolic causes.  Will treat with Linzess. -     Magnesium; Future -     Basic metabolic panel; Future -     linaclotide (LINZESS) 72 MCG capsule; Take 1 capsule (72 mcg total) by mouth daily before breakfast.   I have discontinued Blinda L. Terlizzi's pregabalin, fluticasone, meloxicam, diclofenac sodium, azithromycin, predniSONE, and gabapentin. I am also having her start on linaclotide and traMADol. Additionally, I am having her maintain her ALIGN, aspirin EC, cholecalciferol, calcium carbonate, acetaminophen, amLODipine, losartan, metoprolol succinate, albuterol, hydrochlorothiazide, LORazepam, rosuvastatin, and lansoprazole.  Meds ordered this encounter  Medications  . linaclotide (LINZESS) 72 MCG capsule    Sig: Take 1 capsule (72 mcg total) by mouth daily before breakfast.    Dispense:  90 capsule    Refill:  1  . traMADol (ULTRAM) 50 MG tablet    Sig: Take 1 tablet (50 mg total) by mouth at bedtime as needed.    Dispense:  90 tablet    Refill:  1     Follow-up: Return in about 3 months (around 01/19/2018).  Scarlette Calico, MD

## 2017-10-19 NOTE — Patient Instructions (Signed)

## 2017-10-20 ENCOUNTER — Encounter: Payer: Self-pay | Admitting: Internal Medicine

## 2017-10-21 ENCOUNTER — Telehealth: Payer: Self-pay | Admitting: Internal Medicine

## 2017-10-21 MED ORDER — LANSOPRAZOLE 15 MG PO CPDR: DELAYED_RELEASE_CAPSULE | ORAL | 0 refills | Status: DC

## 2017-10-21 NOTE — Telephone Encounter (Signed)
Refill sent as requested. 

## 2017-10-21 NOTE — Telephone Encounter (Signed)
Pt needs rf for [facility]razole [facility].

## 2017-10-26 ENCOUNTER — Other Ambulatory Visit: Payer: Self-pay | Admitting: Internal Medicine

## 2017-11-10 ENCOUNTER — Ambulatory Visit (INDEPENDENT_AMBULATORY_CARE_PROVIDER_SITE_OTHER): Payer: PPO | Admitting: Family

## 2017-11-10 ENCOUNTER — Encounter: Payer: Self-pay | Admitting: Family

## 2017-11-10 ENCOUNTER — Ambulatory Visit: Payer: Self-pay | Admitting: Internal Medicine

## 2017-11-10 VITALS — BP 136/70 | HR 64 | Temp 97.6°F | Ht 65.0 in | Wt 155.1 lb

## 2017-11-10 DIAGNOSIS — J019 Acute sinusitis, unspecified: Secondary | ICD-10-CM

## 2017-11-10 MED ORDER — PREDNISONE 20 MG PO TABS: 20.0000 mg | ORAL_TABLET | Freq: Every day | ORAL | 0 refills | Status: DC

## 2017-11-10 MED ORDER — DOXYCYCLINE HYCLATE 100 MG PO TABS: 100.0000 mg | ORAL_TABLET | Freq: Two times a day (BID) | ORAL | 0 refills | Status: DC

## 2017-11-10 NOTE — Progress Notes (Signed)
Krystal [name] is a 84 y.o. female with the following history as recorded in EpicCare:  Patient Active Problem List   Diagnosis Date Noted  . Chronic idiopathic constipation 10/19/2017  . LBBB (left bundle branch block) 07/16/2016  . Cardiomyopathy ([place]) 07/16/2016  . Benign paroxysmal positional vertigo 01/17/2016  . Nocturnal foot cramps 01/17/2016  . Hyperglycemia 05/23/2015  . IBS (irritable bowel syndrome) 10/13/2011  . GERD (gastroesophageal reflux disease) 06/25/2010  . RENAL CYST 11/18/2007  . OSTEOPOROSIS 11/18/2007  . Allergic rhinitis 10/06/2007  . Hyperlipidemia with target LDL less than 70 07/19/2007  . GAD (generalized anxiety disorder) 07/19/2007  . Essential hypertension 07/19/2007  . CAD S/P percutaneous coronary angioplasty 07/19/2007  . VENOUS INSUFFICIENCY 07/19/2007  . Osteoarthritis 07/19/2007  . LOW BACK PAIN SYNDROME 07/19/2007    Current Outpatient Medications  Medication Sig Dispense Refill  . acetaminophen (TYLENOL) 500 MG tablet Take 500 mg by mouth every 4 (four) hours as needed for mild pain or headache.    . albuterol (PROVENTIL HFA;VENTOLIN HFA) 108 (90 Base) MCG/ACT inhaler Inhale 2 puffs into the lungs every 6 (six) hours as needed for wheezing or shortness of breath. 1 Inhaler 2  . amLODipine (NORVASC) 10 MG tablet TAKE 1 TABLET BY MOUTH EVERY DAY 90 tablet 1  . aspirin EC 81 MG tablet Take 81 mg by mouth daily.    . calcium carbonate (OS-CAL) 600 MG TABS tablet Take 600 mg by mouth 2 (two) times daily with a meal.    . cholecalciferol (VITAMIN D) 1000 UNITS tablet Take 2,000 Units by mouth daily.     . lansoprazole (PREVACID) 15 MG capsule TAKE 1 CAPSULE BY MOUTH TWICE A DAY BEFORE MEALS 180 capsule 0  . linaclotide (LINZESS) 72 MCG capsule Take 1 capsule (72 mcg total) by mouth daily before breakfast. 90 capsule 1  . LORazepam (ATIVAN) 1 MG tablet Take 0.5 tablets (0.5 mg total) by mouth at bedtime as needed for anxiety. 30 tablet 3  . losartan  (COZAAR) 50 MG tablet TAKE 1 TABLET BY MOUTH EVERY DAY 90 tablet 1  . metoprolol succinate (TOPROL-XL) 25 MG 24 hr tablet TAKE 1 TABLET BY MOUTH EVERY DAY 90 tablet 1  . Probiotic Product (ALIGN) 4 MG CAPS Take 4 mg by mouth daily.     . rosuvastatin (CRESTOR) 10 MG tablet Take 1 tablet (10 mg total) by mouth daily. 90 tablet 3  . traMADol (ULTRAM) 50 MG tablet Take 1 tablet (50 mg total) by mouth at bedtime as needed. 90 tablet 1  . doxycycline (VIBRA-TABS) 100 MG tablet Take 1 tablet (100 mg total) by mouth 2 (two) times daily. 20 tablet 0  . hydrochlorothiazide (HYDRODIURIL) 25 MG tablet Take 0.5 tablets (12.5 mg total) by mouth daily. 30 tablet 0  . predniSONE (DELTASONE) 20 MG tablet Take 1 tablet (20 mg total) by mouth daily with breakfast. 5 tablet 0   No current facility-administered medications for this visit.     Allergies: Atorvastatin; Simvastatin; Alendronate sodium; Cefdinir; Esomeprazole magnesium; Levofloxacin; Omeprazole; Other; Penicillins; and Shellfish-derived products  Past Medical History:  Diagnosis Date  . Adenomatous colon polyp   . Allergy    seasonal  . Anemia    pt. denies  . Anxiety   . CAD (coronary artery disease)   . Cataract    cataracts removed left eye.  . Chronic lower back pain   . Diverticulosis   . GERD (gastroesophageal reflux disease)   . Headache(784.0)   .  Hiatal hernia   . Hyperlipidemia   . Hypertension   . IBS (irritable bowel syndrome) 10/13/2011  . Osteoarthritis   . Osteoporosis   . Renal cysts, acquired, bilateral   . SBO (small bowel obstruction) ([place]) 09/05/2015  . Venous insufficiency     Past Surgical History:  Procedure Laterality Date  . ABDOMINAL HYSTERECTOMY    . BLADDER REPAIR    . CARDIAC CATHETERIZATION  01/12/2002   90% stenosis of the left circumflex, stented with a 3x7mm Cypher stent, resulting in reduction of 90% to 10%   . CARDIOVASCULAR STRESS TEST  03/22/2007   Mild inferolateral thinning toward the apex  without significant ischemia. Nondiagnostic electrocardiogram.  . CATARACT EXTRACTION     bilateral  . COLONOSCOPY  08/17/2008   adenomatous polyp, diverticulosis, external hemorrhoids  . COLONOSCOPY W/ BIOPSIES     multiple   . LEFT LOWER EXTREMITY VENOUS DOPPLER  06/18/2011   No evidence of left lower extremity DVT  . TRANSTHORACIC ECHOCARDIOGRAM  11/18/2012   EF 58-85%, LV systolic mild-moderately reduced, mild-moderate mitral valve regurg, mild-moderate tricuspid valve regurg. NSR-LBBB with occas PVCs  . UPPER GASTROINTESTINAL ENDOSCOPY  07/03/2010   hiatal hernia  . VARICOSE VEIN SURGERY      Family History  Problem Relation Age of Onset  . Heart disease Father   . Stroke Sister   . Colon cancer Neg Hx     Social History   Tobacco Use  . Smoking status: Never Smoker  . Smokeless tobacco: Never Used  Substance Use Topics  . Alcohol use: No    Alcohol/week: 0.0 standard drinks    Subjective:  Patient presents with concerns for sinus infection/ cough; does have seasonal allergies especially in the spring and fall; re-started her albuterol inhaler yesterday; took Zyrtec last night with limited benefit;     Objective:  Vitals:   11/10/17 1511  BP: 136/70  Pulse: 64  Temp: 97.6 F (36.4 C)  TempSrc: Oral  SpO2: 97%  Weight: 155 lb 1.9 oz (70.4 kg)  Height: 5\' 5"  (1.651 m)    General: Well developed, well nourished, in no acute distress  Skin : Warm and dry.  Head: Normocephalic and atraumatic  Eyes: Sclera and conjunctiva clear; pupils round and reactive to light; extraocular movements intact  Ears: External normal; canals clear; tympanic membranes normal  Oropharynx: Pink, supple. No suspicious lesions  Neck: Supple without thyromegaly, adenopathy  Lungs: Respirations unlabored; clear to auscultation bilaterally without wheeze, rales, rhonchi  CVS exam: normal rate and regular rhythm.  Neurologic: Alert and oriented; speech intact; face symmetrical; moves all  extremities well; CNII-XII intact without focal deficit   Assessment:  1. Acute sinusitis, recurrence not specified, unspecified location     Plan:  Suspect allergic component; continue Zyrtec; Rx for Doxycycline 100 mg bid x 10 days and Prednisone 20 mg qd x 5 days; increase fluids, rest and follow-up worse, no better.   No follow-ups on file.  No orders of the defined types were placed in this encounter.   Requested Prescriptions   Signed Prescriptions Disp Refills  . doxycycline (VIBRA-TABS) 100 MG tablet 20 tablet 0    Sig: Take 1 tablet (100 mg total) by mouth 2 (two) times daily.  . predniSONE (DELTASONE) 20 MG tablet 5 tablet 0    Sig: Take 1 tablet (20 mg total) by mouth daily with breakfast.

## 2017-11-10 NOTE — Telephone Encounter (Signed)
Patient is calling with abdominal pain and headache.   Reason for Disposition . [1] MODERATE headache (e.g., interferes with normal activities) AND [2] present > 24 hours AND [3] unexplained  (Exceptions: analgesics not tried, typical migraine, or headache part of viral illness)  Answer Assessment - Initial Assessment Questions 1. LOCATION: "Where does it hurt?"      midway 2. RADIATION: "Does the pain shoot anywhere else?" (e.g., chest, back)     No- solid pain-  3. ONSET: "When did the pain begin?" (e.g., minutes, hours or days ago)      3-4 days ago 4. SUDDEN: "Gradual or sudden onset?"     Sudden onset 5. PATTERN "Does the pain come and go, or is it constant?"    - If constant: "Is it getting better, staying the same, or worsening?"      (Note: Constant means the pain never goes away completely; most serious pain is constant and it progresses)     - If intermittent: "How long does it last?" "Do you have pain now?"     (Note: Intermittent means the pain goes away completely between bouts)     Comes and goes 6. SEVERITY: "How bad is the pain?"  (e.g., Scale 1-10; mild, moderate, or severe)   - MILD (1-3): doesn't interfere with normal activities, abdomen soft and not tender to touch    - MODERATE (4-7): interferes with normal activities or awakens from sleep, tender to touch    - SEVERE (8-10): excruciating pain, doubled over, unable to do any normal activities      5 to 3 7. RECURRENT SYMPTOM: "Have you ever had this type of abdominal pain before?" If so, ask: "When was the last time?" and "What happened that time?"      no 8. CAUSE: "What do you think is causing the abdominal pain?"     Viral- patient thought she had picked up something 9. RELIEVING/AGGRAVATING FACTORS: "What makes it better or worse?" (e.g., movement, antacids, bowel movement)     Drinking something cool 10. OTHER SYMPTOMS: "Has there been any vomiting, diarrhea, constipation, or urine problems?"       Diarrhea-  2 episodes yesterday 11. PREGNANCY: "Is there any chance you are pregnant?" "When was your last menstrual period?"       n/a  Answer Assessment - Initial Assessment Questions 1. LOCATION: "Where does it hurt?"      Middle-forehead- in eyes 2. ONSET: "When did the headache start?" (Minutes, hours or days)      1 week ago 3. PATTERN: "Does the pain come and go, or has it been constant since it started?"     Comes and goes- Zyrtec seems help 4. SEVERITY: "How bad is the pain?" and "What does it keep you from doing?"  (e.g., Scale 1-10; mild, moderate, or severe)   - MILD (1-3): doesn't interfere with normal activities    - MODERATE (4-7): interferes with normal activities or awakens from sleep    - SEVERE (8-10): excruciating pain, unable to do any normal activities        9- yesterday- today headache is more dull 5. RECURRENT SYMPTOM: "Have you ever had headaches before?" If so, ask: "When was the last time?" and "What happened that time?"      Patient thinks she may be having sinus headaches 6. CAUSE: "What do you think is causing the headache?"     sinus 7. MIGRAINE: "Have you been diagnosed with migraine headaches?" If so, ask: "  Is this headache similar?"      no 8. HEAD INJURY: "Has there been any recent injury to the head?"      no 9. OTHER SYMPTOMS: "Do you have any other symptoms?" (fever, stiff neck, eye pain, sore throat, cold symptoms)     Sinus drainage , possible fever last night 10. PREGNANCY: "Is there any chance you are pregnant?" "When was your last menstrual period?"       n/a  Protocols used: HEADACHE-A-AH, ABDOMINAL PAIN - [facility]

## 2017-11-23 ENCOUNTER — Other Ambulatory Visit: Payer: Self-pay | Admitting: Internal Medicine

## 2017-12-02 ENCOUNTER — Telehealth: Payer: Self-pay | Admitting: Cardiology

## 2017-12-02 MED ORDER — HYDROCHLOROTHIAZIDE 25 MG PO TABS: 12.5000 mg | ORAL_TABLET | Freq: Every day | ORAL | 3 refills | Status: DC

## 2017-12-02 NOTE — Telephone Encounter (Signed)
New Message     *STAT* If patient is at the pharmacy, call can be transferred to refill team.   1. Which medications need to be refilled? (please list name of each medication and dose if known) hydrochlorothiazide (HYDRODIURIL) 25 MG tablet(Expired)  2. Which pharmacy/location (including street and city if local pharmacy) is medication to be sent to? [id number] [address] [place] - [address]  3. Do they need a 30 day or 90 day supply? 90 day

## 2017-12-11 ENCOUNTER — Ambulatory Visit (INDEPENDENT_AMBULATORY_CARE_PROVIDER_SITE_OTHER)
Admission: RE | Admit: 2017-12-11 | Discharge: 2017-12-11 | Disposition: A | Discharge status: Home / Self Care | Payer: PPO | Source: Ambulatory Visit | Attending: Family | Admitting: Family

## 2017-12-11 ENCOUNTER — Encounter: Payer: Self-pay | Admitting: Family

## 2017-12-11 ENCOUNTER — Ambulatory Visit (INDEPENDENT_AMBULATORY_CARE_PROVIDER_SITE_OTHER): Payer: PPO | Admitting: Family

## 2017-12-11 VITALS — BP 124/70 | HR 67 | Temp 98.2°F | Ht 65.0 in | Wt 157.0 lb

## 2017-12-11 DIAGNOSIS — M25511 Pain in right shoulder: Secondary | ICD-10-CM

## 2017-12-11 MED ORDER — MELOXICAM 7.5 MG PO TABS: 7.5000 mg | ORAL_TABLET | Freq: Every day | ORAL | 0 refills | Status: DC

## 2017-12-11 NOTE — Progress Notes (Signed)
Krystal [name] is a 84 y.o. female with the following history as recorded in EpicCare:  Patient Active Problem List   Diagnosis Date Noted  . Chronic idiopathic constipation 10/19/2017  . LBBB (left bundle branch block) 07/16/2016  . Cardiomyopathy ([place]) 07/16/2016  . Benign paroxysmal positional vertigo 01/17/2016  . Nocturnal foot cramps 01/17/2016  . Hyperglycemia 05/23/2015  . IBS (irritable bowel syndrome) 10/13/2011  . GERD (gastroesophageal reflux disease) 06/25/2010  . RENAL CYST 11/18/2007  . OSTEOPOROSIS 11/18/2007  . Allergic rhinitis 10/06/2007  . Hyperlipidemia with target LDL less than 70 07/19/2007  . GAD (generalized anxiety disorder) 07/19/2007  . Essential hypertension 07/19/2007  . CAD S/P percutaneous coronary angioplasty 07/19/2007  . VENOUS INSUFFICIENCY 07/19/2007  . Osteoarthritis 07/19/2007  . LOW BACK PAIN SYNDROME 07/19/2007    Current Outpatient Medications  Medication Sig Dispense Refill  . acetaminophen (TYLENOL) 500 MG tablet Take 500 mg by mouth every 4 (four) hours as needed for mild pain or headache.    Marland Kitchen amLODipine (NORVASC) 10 MG tablet TAKE 1 TABLET BY MOUTH EVERY DAY 90 tablet 1  . aspirin EC 81 MG tablet Take 81 mg by mouth daily.    . calcium carbonate (OS-CAL) 600 MG TABS tablet Take 600 mg by mouth 2 (two) times daily with a meal.    . cholecalciferol (VITAMIN D) 1000 UNITS tablet Take 2,000 Units by mouth daily.     Marland Kitchen doxycycline (VIBRA-TABS) 100 MG tablet Take 1 tablet (100 mg total) by mouth 2 (two) times daily. 20 tablet 0  . hydrochlorothiazide (HYDRODIURIL) 25 MG tablet Take 0.5 tablets (12.5 mg total) by mouth daily. 90 tablet 3  . lansoprazole (PREVACID) 15 MG capsule TAKE 1 CAPSULE BY MOUTH TWICE A DAY BEFORE MEALS 180 capsule 0  . linaclotide (LINZESS) 72 MCG capsule Take 1 capsule (72 mcg total) by mouth daily before breakfast. 90 capsule 1  . LORazepam (ATIVAN) 1 MG tablet Take 0.5 tablets (0.5 mg total) by mouth at bedtime as  needed for anxiety. 30 tablet 3  . losartan (COZAAR) 50 MG tablet TAKE 1 TABLET BY MOUTH EVERY DAY 90 tablet 1  . meloxicam (MOBIC) 7.5 MG tablet Take 1 tablet (7.5 mg total) by mouth daily. 30 tablet 0  . metoprolol succinate (TOPROL-XL) 25 MG 24 hr tablet TAKE 1 TABLET BY MOUTH EVERY DAY 90 tablet 1  . PROAIR HFA 108 (90 Base) MCG/ACT inhaler TAKE 2 PUFFS BY MOUTH EVERY 6 HOURS AS NEEDED FOR WHEEZE OR SHORTNESS OF BREATH 8.5 Inhaler 2  . Probiotic Product (ALIGN) 4 MG CAPS Take 4 mg by mouth daily.     . rosuvastatin (CRESTOR) 10 MG tablet Take 1 tablet (10 mg total) by mouth daily. 90 tablet 3  . traMADol (ULTRAM) 50 MG tablet Take 1 tablet (50 mg total) by mouth at bedtime as needed. 90 tablet 1   No current facility-administered medications for this visit.     Allergies: Atorvastatin; Simvastatin; Alendronate sodium; Cefdinir; Esomeprazole magnesium; Levofloxacin; Omeprazole; Other; Penicillins; and Shellfish-derived products  Past Medical History:  Diagnosis Date  . Adenomatous colon polyp   . Allergy    seasonal  . Anemia    pt. denies  . Anxiety   . CAD (coronary artery disease)   . Cataract    cataracts removed left eye.  . Chronic lower back pain   . Diverticulosis   . GERD (gastroesophageal reflux disease)   . Headache(784.0)   . Hiatal hernia   . Hyperlipidemia   .  Hypertension   . IBS (irritable bowel syndrome) 10/13/2011  . Osteoarthritis   . Osteoporosis   . Renal cysts, acquired, bilateral   . SBO (small bowel obstruction) ([place]) 09/05/2015  . Venous insufficiency     Past Surgical History:  Procedure Laterality Date  . ABDOMINAL HYSTERECTOMY    . BLADDER REPAIR    . CARDIAC CATHETERIZATION  01/12/2002   90% stenosis of the left circumflex, stented with a 3x78mm Cypher stent, resulting in reduction of 90% to 10%   . CARDIOVASCULAR STRESS TEST  03/22/2007   Mild inferolateral thinning toward the apex without significant ischemia. Nondiagnostic electrocardiogram.   . CATARACT EXTRACTION     bilateral  . COLONOSCOPY  08/17/2008   adenomatous polyp, diverticulosis, external hemorrhoids  . COLONOSCOPY W/ BIOPSIES     multiple   . LEFT LOWER EXTREMITY VENOUS DOPPLER  06/18/2011   No evidence of left lower extremity DVT  . TRANSTHORACIC ECHOCARDIOGRAM  11/18/2012   EF 15-05%, LV systolic mild-moderately reduced, mild-moderate mitral valve regurg, mild-moderate tricuspid valve regurg. NSR-LBBB with occas PVCs  . UPPER GASTROINTESTINAL ENDOSCOPY  07/03/2010   hiatal hernia  . VARICOSE VEIN SURGERY      Family History  Problem Relation Age of Onset  . Heart disease Father   . Stroke Sister   . Colon cancer Neg Hx     Social History   Tobacco Use  . Smoking status: Never Smoker  . Smokeless tobacco: Never Used  Substance Use Topics  . Alcohol use: No    Alcohol/week: 0.0 standard drinks    Subjective:  Patient presents with concerns for 1 week history of right shoulder pain; fell last week and landed on her right shoulder; has been using Advil twice a day with limited benefit; remembers bracing herself for the fall with her arms; did not hit her head; scraped her right elbow; worried that she may have fractured the shoulder.   Objective:  Vitals:   12/11/17 1107  BP: 124/70  Pulse: 67  Temp: 98.2 F (36.8 C)  TempSrc: Oral  SpO2: 97%  Weight: 157 lb (71.2 kg)  Height: 5\' 5"  (1.651 m)    General: Well developed, well nourished, in no acute distress  Skin : Warm and dry.  Head: Normocephalic and atraumatic  Lungs: Respirations unlabored; clear to auscultation bilaterally without wheeze, rales, rhonchi  CVS exam: normal rate and regular rhythm.  Musculoskeletal: No deformities; no active joint inflammation  Extremities: No edema, cyanosis, clubbing  Vessels: Symmetric bilaterally  Neurologic: Alert and oriented; speech intact; face symmetrical; moves all extremities well; CNII-XII intact without focal deficit   Assessment:  1. Acute pain  of right shoulder     Plan:  X-ray did not show fracture; suspect soft tissue injury; trial of Mobic 7.5 mg qd; okay to use Tramadol at night; follow-up worse, no better- refer to sports medicine.   No follow-ups on file.  Orders Placed This Encounter  Procedures  . DG Shoulder Right    Standing Status:   Future    Number of Occurrences:   1    Standing Expiration Date:   02/11/2019    Order Specific Question:   Reason for Exam (SYMPTOM  OR DIAGNOSIS REQUIRED)    Answer:   right shoulder pain    Order Specific Question:   Preferred imaging location?    Answer:   [facility]    Order Specific Question:   Radiology Contrast Protocol - do NOT remove file path  Answer:   [email or web address]    Requested Prescriptions   Signed Prescriptions Disp Refills  . meloxicam (MOBIC) 7.5 MG tablet 30 tablet 0    Sig: Take 1 tablet (7.5 mg total) by mouth daily.

## 2017-12-14 DIAGNOSIS — Z01419 Encounter for gynecological examination (general) (routine) without abnormal findings: Secondary | ICD-10-CM | POA: Diagnosis not present

## 2017-12-14 DIAGNOSIS — Z1231 Encounter for screening mammogram for malignant neoplasm of breast: Secondary | ICD-10-CM | POA: Diagnosis not present

## 2017-12-17 ENCOUNTER — Telehealth (INDEPENDENT_AMBULATORY_CARE_PROVIDER_SITE_OTHER): Payer: Self-pay | Admitting: Orthopedic Surgery

## 2017-12-17 MED ORDER — GABAPENTIN 100 MG PO CAPS: ORAL_CAPSULE | ORAL | 0 refills | Status: DC

## 2017-12-17 NOTE — Telephone Encounter (Signed)
Pt called wanting a refill of Gabapentin 100mg  states it really helps her.  Please call  Pt to advise [phone]

## 2017-12-17 NOTE — Telephone Encounter (Signed)
FYI Patient requested refill on neurontin-I sent in rx.

## 2017-12-21 NOTE — Telephone Encounter (Signed)
y

## 2017-12-22 ENCOUNTER — Other Ambulatory Visit: Payer: Self-pay | Admitting: Internal Medicine

## 2017-12-22 ENCOUNTER — Telehealth: Payer: Self-pay | Admitting: Internal Medicine

## 2017-12-22 DIAGNOSIS — J301 Allergic rhinitis due to pollen: Secondary | ICD-10-CM

## 2017-12-22 MED ORDER — FLUTICASONE PROPIONATE 50 MCG/ACT NA SUSP: 2.0000 | Freq: Every day | NASAL | 6 refills | Status: DC

## 2017-12-22 NOTE — Telephone Encounter (Signed)
Copied from [place] [phone]. Topic: General - Other >> Dec 22, 2017  9:48 AM Lennox Solders wrote: Reason for CRM: pt is calling and needs a new rx flonase. Pt had medication in the past. [facility] [address]. Pt is having allergy issues from ragweed

## 2017-12-22 NOTE — Telephone Encounter (Signed)
Okay to send rx for flonase?

## 2018-01-09 ENCOUNTER — Other Ambulatory Visit (INDEPENDENT_AMBULATORY_CARE_PROVIDER_SITE_OTHER): Payer: Self-pay | Admitting: Orthopedic Surgery

## 2018-01-11 NOTE — Telephone Encounter (Signed)
y

## 2018-01-11 NOTE — Telephone Encounter (Signed)
Ok for 90 day supply.  

## 2018-01-20 ENCOUNTER — Ambulatory Visit (INDEPENDENT_AMBULATORY_CARE_PROVIDER_SITE_OTHER): Payer: PPO

## 2018-01-20 DIAGNOSIS — Z23 Encounter for immunization: Secondary | ICD-10-CM

## 2018-01-29 ENCOUNTER — Encounter: Payer: Self-pay | Admitting: Cardiology

## 2018-01-29 ENCOUNTER — Ambulatory Visit: Payer: PPO | Admitting: Cardiology

## 2018-01-29 VITALS — BP 152/84 | HR 71 | Resp 16 | Ht 66.0 in | Wt 154.8 lb

## 2018-01-29 DIAGNOSIS — E785 Hyperlipidemia, unspecified: Secondary | ICD-10-CM

## 2018-01-29 DIAGNOSIS — I447 Left bundle-branch block, unspecified: Secondary | ICD-10-CM | POA: Diagnosis not present

## 2018-01-29 DIAGNOSIS — Z9861 Coronary angioplasty status: Secondary | ICD-10-CM | POA: Diagnosis not present

## 2018-01-29 DIAGNOSIS — I420 Dilated cardiomyopathy: Secondary | ICD-10-CM | POA: Diagnosis not present

## 2018-01-29 DIAGNOSIS — I2510 Atherosclerotic heart disease of native coronary artery without angina pectoris: Secondary | ICD-10-CM

## 2018-01-29 DIAGNOSIS — I10 Essential (primary) hypertension: Secondary | ICD-10-CM

## 2018-01-29 NOTE — Patient Instructions (Signed)
Medication Instructions:  Your physician recommends that you continue on your current medications as directed. Please refer to the Current Medication list given to you today. If you need a refill on your cardiac medications before your next appointment, please call your pharmacy.   Lab work: None  If you have labs (blood work) drawn today and your tests are completely normal, you will receive your results only by: Marland Kitchen MyChart Message (if you have MyChart) OR . A paper copy in the mail If you have any lab test that is abnormal or we need to change your treatment, we will call you to review the results.  Testing/Procedures: None  Follow-Up: At [facility], you and your health needs are our priority.  As part of our continuing mission to provide you with exceptional heart care, we have created designated Provider Care Teams.  These Care Teams include your primary Cardiologist (physician) and Advanced Practice Providers (APPs -  Physician Assistants and Nurse Practitioners) who all work together to provide you with the care you need, when you need it. You will need a follow up appointment in 6 months.  Please call our office 2 months in advance to schedule this appointment.  You may see Quay Burow, MD or one of the following Advanced Practice Providers on your designated Care Team:   Kerin Ransom, PA-C Roby Lofts, [place] . Sande Rives, PA-C  Any Other Special Instructions Will Be Listed Below (If Applicable). Record your blood pressure a few times a week, bring your readings to your next appointment

## 2018-01-29 NOTE — Progress Notes (Signed)
01/29/2018 LAM BJORKLUND   02-Dec-1929  [id number]  Primary Physician Janith Lima, MD Primary Cardiologist: Dr Gwenlyn Found  HPI:  Pleasant 84 y/o female followed by Dr Gwenlyn Found, her husband is also a patient of Dr Kennon Holter.  Ms Wee has a history of CAD s/p remote CFX PCI with DES in 2003 by Dr Olevia Perches. She had no other CAD at the time. She has never had chest pain, her presenting symptoms in 2003 were SOB. She had a Myoview in 2008 that was normal. An echo in 2014 showed her EF to be 40-45%. Other medical issues are HTN, HLD, and chronic LBBB. She is in the office today for a routine 6 month follow up. She walks an hour a day at the gym (around the track, not on treadmill) without problems. She denies any chest pain or unusual dyspnea when she walks but does admit to fatigue when doing housework- vacuuming, sweeping, and cleaning. She attributes this to her age.     Current Outpatient Medications  Medication Sig Dispense Refill  . acetaminophen (TYLENOL) 500 MG tablet Take 500 mg by mouth every 4 (four) hours as needed for mild pain or headache.    Marland Kitchen amLODipine (NORVASC) 10 MG tablet TAKE 1 TABLET BY MOUTH EVERY DAY 90 tablet 1  . calcium carbonate (OS-CAL) 600 MG TABS tablet Take 600 mg by mouth 2 (two) times daily with a meal.    . cholecalciferol (VITAMIN D) 1000 UNITS tablet Take 2,000 Units by mouth daily.     . fluticasone (FLONASE) 50 MCG/ACT nasal spray Place 2 sprays into both nostrils daily. 16 g 6  . gabapentin (NEURONTIN) 100 MG capsule TAKE 1 CAPSULE BY MOUTH EVERY DAY AT BEDTIME AS NEEDED 90 capsule 1  . hydrochlorothiazide (HYDRODIURIL) 25 MG tablet Take 0.5 tablets (12.5 mg total) by mouth daily. 90 tablet 3  . lansoprazole (PREVACID) 15 MG capsule TAKE 1 CAPSULE BY MOUTH TWICE A DAY BEFORE MEALS 180 capsule 0  . linaclotide (LINZESS) 72 MCG capsule Take 1 capsule (72 mcg total) by mouth daily before breakfast. 90 capsule 1  . LORazepam (ATIVAN) 1 MG tablet Take 0.5 tablets  (0.5 mg total) by mouth at bedtime as needed for anxiety. 30 tablet 3  . losartan (COZAAR) 50 MG tablet TAKE 1 TABLET BY MOUTH EVERY DAY 90 tablet 1  . meloxicam (MOBIC) 7.5 MG tablet Take 1 tablet (7.5 mg total) by mouth daily. 30 tablet 0  . metoprolol succinate (TOPROL-XL) 25 MG 24 hr tablet TAKE 1 TABLET BY MOUTH EVERY DAY 90 tablet 1  . PROAIR HFA 108 (90 Base) MCG/ACT inhaler TAKE 2 PUFFS BY MOUTH EVERY 6 HOURS AS NEEDED FOR WHEEZE OR SHORTNESS OF BREATH 8.5 Inhaler 2  . Probiotic Product (ALIGN) 4 MG CAPS Take 4 mg by mouth daily.     . rosuvastatin (CRESTOR) 10 MG tablet Take 1 tablet (10 mg total) by mouth daily. 90 tablet 3  . traMADol (ULTRAM) 50 MG tablet Take 1 tablet (50 mg total) by mouth at bedtime as needed. 90 tablet 1   No current facility-administered medications for this visit.     Allergies  Allergen Reactions  . Atorvastatin Other (See Comments)    Muscle aches  . Simvastatin Other (See Comments)    Myalgias   . Alendronate Sodium     REACTION: pt states INTOL to Fosamax w/ esophagitis  . Cefdinir     REACTION: diarrhea  . Esomeprazole Magnesium  REACTION: pt states INTOL to Nexium  . Levofloxacin     REACTION: diarrhea  . Omeprazole     REACTION: pt states INTOL to Prilosec  . Other     Pneumonia vaccine---felt tired and achy and sick x 6 months to get over this.  . Penicillins Hives  . Shellfish-Derived Products Hives, Nausea Only and Rash    Past Medical History:  Diagnosis Date  . Adenomatous colon polyp   . Allergy    seasonal  . Anemia    pt. denies  . Anxiety   . CAD (coronary artery disease)   . Cataract    cataracts removed left eye.  . Chronic lower back pain   . Diverticulosis   . GERD (gastroesophageal reflux disease)   . Headache(784.0)   . Hiatal hernia   . Hyperlipidemia   . Hypertension   . IBS (irritable bowel syndrome) 10/13/2011  . Osteoarthritis   . Osteoporosis   . Renal cysts, acquired, bilateral   . SBO (small  bowel obstruction) ([place]) 09/05/2015  . Venous insufficiency     Social History   Socioeconomic History  . Marital status: Married    Spouse name: [name]  . Number of children: Not on file  . Years of education: Not on file  . Highest education level: Not on file  Occupational History  . Occupation: retired  Scientific laboratory technician  . Financial resource strain: Not on file  . Food insecurity:    Worry: Not on file    Inability: Not on file  . Transportation needs:    Medical: Not on file    Non-medical: Not on file  Tobacco Use  . Smoking status: Never Smoker  . Smokeless tobacco: Never Used  Substance and Sexual Activity  . Alcohol use: No    Alcohol/week: 0.0 standard drinks  . Drug use: No  . Sexual activity: Not on file  Lifestyle  . Physical activity:    Days per week: Not on file    Minutes per session: Not on file  . Stress: Not on file  Relationships  . Social connections:    Talks on phone: Not on file    Gets together: Not on file    Attends religious service: Not on file    Active member of club or organization: Not on file    Attends meetings of clubs or organizations: Not on file    Relationship status: Not on file  . Intimate partner violence:    Fear of current or ex partner: Not on file    Emotionally abused: Not on file    Physically abused: Not on file    Forced sexual activity: Not on file  Other Topics Concern  . Not on file  Social History Narrative  . Not on file     Family History  Problem Relation Age of Onset  . Heart disease Father   . Stroke Sister   . Colon cancer Neg Hx      Review of Systems: General: negative for chills, fever, night sweats or weight changes.  Cardiovascular: negative for chest pain, dyspnea on exertion, edema, orthopnea, palpitations, paroxysmal nocturnal dyspnea or shortness of breath Dermatological: negative for rash Respiratory: negative for cough or wheezing Urologic: negative for hematuria Abdominal: negative  for nausea, vomiting, diarrhea, bright red blood per rectum, melena, or hematemesis Neurologic: negative for visual changes, syncope, or dizziness All other systems reviewed and are otherwise negative except as noted above.    There were  no vitals taken for this visit.  General appearance: alert, cooperative and no distress Neck: no carotid bruit and no JVD Lungs: clear to auscultation bilaterally Heart: regular rate and rhythm Extremities: no edema Skin: warm and dry Neurologic: Grossly normal  EKG NSR,  68, LBBB  ASSESSMENT AND PLAN:   CAD S/P percutaneous coronary angioplasty CFX PCI in '03, low risk Nuc in '08  Essential hypertension Repeat B/P by me 136/74-. She tells me her B/P at home usually is in this range  Hyperlipidemia with target LDL less than 70 LDL was 70 Jan 2019  LBBB (left bundle branch block) Chronic  PLAN  Her B/P is running a little high. She admits she is not taking HCTZ 12.5 mg daily, I suggested she try doing that and keep a log of her B/P at home. I asked her to let us know if she develops chest pain or dyspnea when she walks, if so she'll need further ischemic work up. F/U with Dr Gwenlyn Found in 6 months.   Kerin Ransom PA-C 01/29/2018 9:05 AM

## 2018-02-08 ENCOUNTER — Encounter: Payer: Self-pay | Admitting: Internal Medicine

## 2018-02-08 ENCOUNTER — Ambulatory Visit: Payer: PPO | Admitting: Internal Medicine

## 2018-02-08 DIAGNOSIS — K582 Mixed irritable bowel syndrome: Secondary | ICD-10-CM | POA: Diagnosis not present

## 2018-02-08 MED ORDER — DICYCLOMINE HCL 10 MG PO CAPS: ORAL_CAPSULE | ORAL | 1 refills | Status: DC

## 2018-02-08 MED ORDER — METRONIDAZOLE 250 MG PO TABS: 250.0000 mg | ORAL_TABLET | Freq: Four times a day (QID) | ORAL | 1 refills | Status: DC

## 2018-02-08 NOTE — Progress Notes (Signed)
Krystal [name] 84 y.o. [birth year] [id number]  Assessment & Plan:   IBS (irritable bowel syndrome) Main problem is constipation and the "diarrhea" is when she starts to finally empty her bowels.  Needs more regular defecation without extremes.   Re tx w/ metronidazole given past responses - I suspect she has component of SIBO Benefiber 1 tsp daily and titrate for effect if needed Refill dicyclomine Return as needed  Meds ordered this encounter  Medications  . metroNIDAZOLE (FLAGYL) 250 MG tablet    Sig: Take 1 tablet (250 mg total) by mouth 4 (four) times daily.    Dispense:  40 tablet    Refill:  1  . dicyclomine (BENTYL) 10 MG capsule    Sig: Every 6 hours as needed abdominal cramps    Dispense:  60 capsule    Refill:  1     I appreciate the opportunity to care for this patient. CC: Krystal Lima, MD      Subjective:   Chief Complaint: constipation  HPI Last seen by me 08/2015 after SBO  Krystal [name] is here with complaints like she has had in the past where she will have very small sky bolus bowel movements for several days or about a week and then start to have multiple bowel movements in a day or 2.  She recalls that metronidazole when taken in the past makes a big difference for this and her gas symptoms.  She does not have any significant difficulty with urination.  On the days when she has multiple bowel movements that start out as formed and become progressively more loose, she have some seepage of stool.  These problems do not make a major difference in her ability to leave the house etc.  She was prescribed Linzess in the summer but our Link Snuffer is she was not able to get that due to cost because the last prescription was in July and she does not think she has taken it.  She does say when she takes her husband's fiber supplement which she will do after multiple days without bowel movements after a few days of taking that she will have burning and then empty her bowel  movements.  He is taking what I think is an over-the-counter psyllium preparation.  Last procedures described below.  Note that when she does get some right lower quadrant cramping that she relates to her "scar" she is referring to the sensation she gets when she is had SBO's in the past.  She has had a few episodes where it lasts for a few hours in the past several months she gets some discomfort there but dicyclomine has relieved it and she has not had anything like she has had in 2017 when she was briefly admitted for an SBO.  The last time she had a CT scan.  Wt Readings from Last 3 Encounters:  02/08/18 155 lb (70.3 kg)  01/29/18 154 lb 12.8 oz (70.2 kg)  12/11/17 157 lb (71.2 kg)    EGD 06/2010 - 3 cm hiatal hernia  07/2008 - last colonoscopy Diminutive polyp in the cecum ADENOMA 2) Moderate diverticulosis in the sigmoid colon 3) External hemorrhoids 4) Otherwise normal examination, excellent prep 5) Prior adenomas   Allergies  Allergen Reactions  . Atorvastatin Other (See Comments)    Muscle aches  . Simvastatin Other (See Comments)    Myalgias   . Alendronate Sodium     REACTION: pt states INTOL to Fosamax w/ esophagitis  .  Cefdinir     REACTION: diarrhea  . Esomeprazole Magnesium     REACTION: pt states INTOL to Nexium  . Levofloxacin     REACTION: diarrhea  . Omeprazole     REACTION: pt states INTOL to Prilosec  . Other     Pneumonia vaccine---felt tired and achy and sick x 6 months to get over this.  . Penicillins Hives  . Shellfish-Derived Products Hives, Nausea Only and Rash   Current Meds  Medication Sig  . acetaminophen (TYLENOL) 500 MG tablet Take 500 mg by mouth every 4 (four) hours as needed for mild pain or headache.  Marland Kitchen amLODipine (NORVASC) 10 MG tablet TAKE 1 TABLET BY MOUTH EVERY DAY  . calcium carbonate (OS-CAL) 600 MG TABS tablet Take 600 mg by mouth 2 (two) times daily with a meal.  . cholecalciferol (VITAMIN D) 1000 UNITS tablet Take 2,000 Units by  mouth daily.   . fluticasone (FLONASE) 50 MCG/ACT nasal spray Place 2 sprays into both nostrils daily.  Marland Kitchen gabapentin (NEURONTIN) 100 MG capsule TAKE 1 CAPSULE BY MOUTH EVERY DAY AT BEDTIME AS NEEDED  . hydrochlorothiazide (HYDRODIURIL) 25 MG tablet Take 0.5 tablets (12.5 mg total) by mouth daily.  . lansoprazole (PREVACID) 15 MG capsule TAKE 1 CAPSULE BY MOUTH TWICE A DAY BEFORE MEALS  . LORazepam (ATIVAN) 1 MG tablet Take 0.5 tablets (0.5 mg total) by mouth at bedtime as needed for anxiety.  Marland Kitchen losartan (COZAAR) 50 MG tablet TAKE 1 TABLET BY MOUTH EVERY DAY  . meloxicam (MOBIC) 7.5 MG tablet Take 1 tablet (7.5 mg total) by mouth daily.  . metoprolol succinate (TOPROL-XL) 25 MG 24 hr tablet TAKE 1 TABLET BY MOUTH EVERY DAY  . PROAIR HFA 108 (90 Base) MCG/ACT inhaler TAKE 2 PUFFS BY MOUTH EVERY 6 HOURS AS NEEDED FOR WHEEZE OR SHORTNESS OF BREATH  . Probiotic Product (ALIGN) 4 MG CAPS Take 4 mg by mouth daily.   . rosuvastatin (CRESTOR) 10 MG tablet Take 1 tablet (10 mg total) by mouth daily.  . traMADol (ULTRAM) 50 MG tablet Take 1 tablet (50 mg total) by mouth at bedtime as needed.   Past Medical History:  Diagnosis Date  . Adenomatous colon polyp   . Allergy    seasonal  . Anemia    pt. denies  . Anxiety   . CAD (coronary artery disease)   . Cataract    cataracts removed left eye.  . Chronic lower back pain   . Diverticulosis   . GERD (gastroesophageal reflux disease)   . Headache(784.0)   . Hiatal hernia   . Hyperlipidemia   . Hypertension   . IBS (irritable bowel syndrome) 10/13/2011  . Osteoarthritis   . Osteoporosis   . Renal cysts, acquired, bilateral   . SBO (small bowel obstruction) ([place]) 09/05/2015  . Venous insufficiency    Past Surgical History:  Procedure Laterality Date  . ABDOMINAL HYSTERECTOMY    . BLADDER REPAIR    . CARDIAC CATHETERIZATION  01/12/2002   90% stenosis of the left circumflex, stented with a 3x69mm Cypher stent, resulting in reduction of 90% to  10%   . CARDIOVASCULAR STRESS TEST  03/22/2007   Mild inferolateral thinning toward the apex without significant ischemia. Nondiagnostic electrocardiogram.  . CATARACT EXTRACTION     bilateral  . COLONOSCOPY  08/17/2008   adenomatous polyp, diverticulosis, external hemorrhoids  . COLONOSCOPY W/ BIOPSIES     multiple   . LEFT LOWER EXTREMITY VENOUS DOPPLER  06/18/2011  No evidence of left lower extremity DVT  . TRANSTHORACIC ECHOCARDIOGRAM  11/18/2012   EF 78-24%, LV systolic mild-moderately reduced, mild-moderate mitral valve regurg, mild-moderate tricuspid valve regurg. NSR-LBBB with occas PVCs  . UPPER GASTROINTESTINAL ENDOSCOPY  07/03/2010   hiatal hernia  . VARICOSE VEIN SURGERY     Social History   Social History Narrative   Married to [employer] who has congestive heart failure problems   She is retired never smoker rare alcohol   family history includes Heart disease in her father; Stroke in her sister.   Review of Systems See HPI  Objective:   Physical Exam BP 116/64   Pulse 72   Ht 5\' 5"  (1.651 m)   Wt 155 lb (70.3 kg)   BMI 25.79 kg/m  She is elderly looking younger than stated age and in no acute distress Lungs are clear anteriorly Heart sounds normal Abdomen is soft slightly protuberant no organomegaly or mass bowel sounds present nontender Eyes are anicteric is alert and oriented x3

## 2018-02-08 NOTE — Patient Instructions (Addendum)
We are giving you a handout on benefiber.  Use a teaspoon daily and adjust according. Dr Carlean Purl wants you to have regular bowel movements without having diarrhea.   We have sent the following medications to your pharmacy for you to pick up at your convenience: Metronidazole, dicyclomine    Follow up with Dr Carlean Purl as needed.    I appreciate the opportunity to care for you. Silvano Rusk, MD, [facility]

## 2018-02-08 NOTE — Assessment & Plan Note (Addendum)
Main problem is constipation and the "diarrhea" is when she starts to finally empty her bowels.  Needs more regular defecation without extremes.  Her problem is deteriorated Re tx w/ metronidazole given past responses - I suspect she has component of SIBO Benefiber 1 tsp daily and titrate for effect if needed Refill dicyclomine Return as needed  Meds ordered this encounter  Medications  . metroNIDAZOLE (FLAGYL) 250 MG tablet    Sig: Take 1 tablet (250 mg total) by mouth 4 (four) times daily.    Dispense:  40 tablet    Refill:  1  . dicyclomine (BENTYL) 10 MG capsule    Sig: Every 6 hours as needed abdominal cramps    Dispense:  60 capsule    Refill:  1

## 2018-03-15 NOTE — Progress Notes (Signed)
Ms. Droz has come in to the [facility] expressing interest in participating in the Provider Referral Exercise Program with her husband.  Will contact her PMD for clearance.

## 2018-03-17 DIAGNOSIS — N952 Postmenopausal atrophic vaginitis: Secondary | ICD-10-CM | POA: Diagnosis not present

## 2018-03-17 DIAGNOSIS — N281 Cyst of kidney, acquired: Secondary | ICD-10-CM | POA: Diagnosis not present

## 2018-03-17 DIAGNOSIS — N3000 Acute cystitis without hematuria: Secondary | ICD-10-CM | POA: Diagnosis not present

## 2018-04-05 ENCOUNTER — Telehealth: Payer: Self-pay | Admitting: Internal Medicine

## 2018-04-05 MED ORDER — LANSOPRAZOLE 15 MG PO CPDR: DELAYED_RELEASE_CAPSULE | ORAL | 3 refills | Status: DC

## 2018-04-05 NOTE — Telephone Encounter (Signed)
Patient informed. 

## 2018-04-05 NOTE — Telephone Encounter (Signed)
Lansoprazole refilled as requested.

## 2018-04-07 NOTE — Progress Notes (Signed)
[place] Report   Patient Details  Name: Krystal [name] MRN: [id number] Date of Birth: [birth year] Age: 85 y.o. PCP: Janith Lima, MD  Vitals:   04/07/18 1621  BP: 138/78  Pulse: 67  Resp: 18  SpO2: 95%  Weight: 155 lb 3.2 oz (70.4 kg)     [facility] Eval - 04/07/18 1600      Referral    Referring Provider  Dr. Ronnald Ramp    Reason for referral  Other    Program Start Date  04/07/18      Measurement   Waist Circumference  34.5 inches    Hip Circumference  42 inches    Body fat  43.3 percent      Information for Trainer   Goals  to be able to remain independent, stable and healthy    Current Exercise  walk 58mins x 5x/wk    Orthopedic Concerns  none    Pertinent Medical History  hen, venus insuff, LBBB, IBS    Current Barriers  none      Mobility and Daily Activities   I find it easy to walk up or down two or more flights of stairs.  4    I have no trouble taking out the trash.  4    I do housework such as vacuuming and dusting on my own without difficulty.  4    I can easily lift a gallon of milk (8lbs).  4    I can easily walk a mile.  4    I have no trouble reaching into high cupboards or reaching down to pick up something from the floor.  4    I do not have trouble doing out-door work such as Armed forces logistics/support/administrative officer, raking leaves, or gardening.  4      Mobility and Daily Activities   I feel younger than my age.  4    I feel independent.  4    I feel energetic.  4    I live an active life.   4    I feel strong.  4    I feel healthy.  4    I feel active as other people my age.  4      How fit and strong are you.   Fit and Strong Total Score  56      Past Medical History:  Diagnosis Date  . Adenomatous colon polyp   . Allergy    seasonal  . Anemia    pt. denies  . Anxiety   . CAD (coronary artery disease)   . Cataract    cataracts removed left eye.  . Chronic lower back pain   . Diverticulosis   . GERD (gastroesophageal reflux disease)    . Headache(784.0)   . Hiatal hernia   . Hyperlipidemia   . Hypertension   . IBS (irritable bowel syndrome) 10/13/2011  . Osteoarthritis   . Osteoporosis   . Renal cysts, acquired, bilateral   . SBO (small bowel obstruction) ([place]) 09/05/2015  . Venous insufficiency    Past Surgical History:  Procedure Laterality Date  . ABDOMINAL HYSTERECTOMY    . BLADDER REPAIR    . CARDIAC CATHETERIZATION  01/12/2002   90% stenosis of the left circumflex, stented with a 3x67mm Cypher stent, resulting in reduction of 90% to 10%   . CARDIOVASCULAR STRESS TEST  03/22/2007   Mild inferolateral thinning toward the apex without significant ischemia. Nondiagnostic electrocardiogram.  . CATARACT  EXTRACTION     bilateral  . COLONOSCOPY  08/17/2008   adenomatous polyp, diverticulosis, external hemorrhoids  . COLONOSCOPY W/ BIOPSIES     multiple   . LEFT LOWER EXTREMITY VENOUS DOPPLER  06/18/2011   No evidence of left lower extremity DVT  . TRANSTHORACIC ECHOCARDIOGRAM  11/18/2012   EF 38-46%, LV systolic mild-moderately reduced, mild-moderate mitral valve regurg, mild-moderate tricuspid valve regurg. NSR-LBBB with occas PVCs  . UPPER GASTROINTESTINAL ENDOSCOPY  07/03/2010   hiatal hernia  . VARICOSE VEIN SURGERY     Social History   Tobacco Use  Smoking Status Never Smoker  Smokeless Tobacco Never Used     Ms. Freitas and her husband are registered for the 12-week, twice weekly PREP at the [facility].  They began today  Vanita Ingles 04/07/2018, 4:23 PM

## 2018-04-25 ENCOUNTER — Other Ambulatory Visit: Payer: Self-pay | Admitting: Internal Medicine

## 2018-04-28 NOTE — Progress Notes (Signed)
[facility] PREP Weekly Session   Patient Details  Name: Krystal [name] MRN: [id number] Date of Birth: 1930/03/23 Age: 85 y.o. PCP: Janith Lima, MD  Vitals:   04/28/18 2146  Weight: 156 lb (70.8 kg)    [facility] Weekly seesion - 04/28/18 2100      Weekly Session   Topic Discussed  Other ways to be active    Minutes exercised this week  120 minutes   90cardio/30strength     Fun things you did since last meeting:"walking"  Vanita Ingles 04/28/2018, 9:47 PM

## 2018-04-28 NOTE — Progress Notes (Signed)
[facility] PREP Weekly Session   Patient Details  Name: RAGNA KRAMLICH MRN: [id number] Date of Birth: 05-21-1929 Age: 85 y.o. PCP: Janith Lima, MD  Vitals:   04/28/18 2049  Weight: 150 lb (68 kg)    [facility] Weekly seesion - 04/28/18 2000      Weekly Session   Topic Discussed  Healthy eating tips      Fun things you did since last meeting:"walking"  Vanita Ingles 04/28/2018, 8:49 PM

## 2018-04-29 ENCOUNTER — Other Ambulatory Visit: Payer: Self-pay | Admitting: Internal Medicine

## 2018-05-05 NOTE — Progress Notes (Signed)
[facility] PREP Weekly Session   Patient Details  Name: Krystal [name] MRN: [id number] Date of Birth: September 01, 1929 Age: 85 y.o. PCP: Janith Lima, MD  Vitals:   05/05/18 2040  Weight: 156 lb (70.8 kg)    [facility] Weekly seesion - 05/05/18 2000      Weekly Session   Topic Discussed  Restaurant Eating    Minutes exercised this week  180 minutes   120cardio/30strength/46flexibility       Vanita Ingles 05/05/2018, 8:41 PM

## 2018-06-03 ENCOUNTER — Encounter: Payer: Self-pay | Admitting: Family Medicine

## 2018-06-03 ENCOUNTER — Ambulatory Visit (INDEPENDENT_AMBULATORY_CARE_PROVIDER_SITE_OTHER): Payer: PPO | Admitting: Family Medicine

## 2018-06-03 VITALS — BP 128/70 | HR 86 | Temp 97.6°F | Ht 65.0 in | Wt 158.2 lb

## 2018-06-03 DIAGNOSIS — J0140 Acute pansinusitis, unspecified: Secondary | ICD-10-CM

## 2018-06-03 DIAGNOSIS — J301 Allergic rhinitis due to pollen: Secondary | ICD-10-CM

## 2018-06-03 MED ORDER — PREDNISONE 20 MG PO TABS: 20.0000 mg | ORAL_TABLET | Freq: Every day | ORAL | 0 refills | Status: DC

## 2018-06-03 MED ORDER — FLUTICASONE PROPIONATE 50 MCG/ACT NA SUSP: 2.0000 | Freq: Every day | NASAL | 6 refills | Status: DC

## 2018-06-03 MED ORDER — DOXYCYCLINE HYCLATE 100 MG PO TABS: 100.0000 mg | ORAL_TABLET | Freq: Two times a day (BID) | ORAL | 0 refills | Status: DC

## 2018-06-03 NOTE — Progress Notes (Signed)
Patient ID: Krystal [name], female   DOB: January 25, 1930, 85 y.o.   MRN: [id number]  PCP: Janith Lima, MD  Subjective:  Krystal [name] is a 85 y.o. year old very pleasant female patient who presents with symptoms including nasal congestion, sinus pressure, and cough that is productive of yellow sputum  -started: 10 days ago, symptoms are not improving  -previous treatments: Zyrtec has provided partial benefit -sick contacts/travel/risks: denies flu exposure. No recent sick contact exposure -Hx of: allergies She reports that she experiences this twice yearly and she was evaluated and treated 11/10/2017 for similar symptoms  Influenza vaccine is UTD. No recent antibiotic use  She does not smoke  ROS-denies fever, SOB, NVD, tooth pain  Pertinent Past Medical History- HTN, Allergic rhinitis  Medications- reviewed  Current Outpatient Medications  Medication Sig Dispense Refill  . acetaminophen (TYLENOL) 500 MG tablet Take 500 mg by mouth every 4 (four) hours as needed for mild pain or headache.    Marland Kitchen amLODipine (NORVASC) 10 MG tablet TAKE 1 TABLET BY MOUTH EVERY DAY 90 tablet 1  . calcium carbonate (OS-CAL) 600 MG TABS tablet Take 600 mg by mouth 2 (two) times daily with a meal.    . cholecalciferol (VITAMIN D) 1000 UNITS tablet Take 2,000 Units by mouth daily.     Marland Kitchen dicyclomine (BENTYL) 10 MG capsule Every 6 hours as needed abdominal cramps 60 capsule 1  . fluticasone (FLONASE) 50 MCG/ACT nasal spray Place 2 sprays into both nostrils daily. 16 g 6  . gabapentin (NEURONTIN) 100 MG capsule TAKE 1 CAPSULE BY MOUTH EVERY DAY AT BEDTIME AS NEEDED 90 capsule 1  . lansoprazole (PREVACID) 15 MG capsule TAKE 1 CAPSULE BY MOUTH TWICE A DAY BEFORE MEALS 180 capsule 3  . LORazepam (ATIVAN) 1 MG tablet Take 0.5 tablets (0.5 mg total) by mouth at bedtime as needed for anxiety. 30 tablet 3  . losartan (COZAAR) 50 MG tablet TAKE 1 TABLET BY MOUTH EVERY DAY 90 tablet 1  . meloxicam (MOBIC) 7.5 MG tablet Take  1 tablet (7.5 mg total) by mouth daily. 30 tablet 0  . metoprolol succinate (TOPROL-XL) 25 MG 24 hr tablet TAKE 1 TABLET BY MOUTH EVERY DAY 90 tablet 1  . metroNIDAZOLE (FLAGYL) 250 MG tablet Take 1 tablet (250 mg total) by mouth 4 (four) times daily. 40 tablet 1  . PROAIR HFA 108 (90 Base) MCG/ACT inhaler TAKE 2 PUFFS BY MOUTH EVERY 6 HOURS AS NEEDED FOR WHEEZE OR SHORTNESS OF BREATH 8.5 Inhaler 2  . Probiotic Product (ALIGN) 4 MG CAPS Take 4 mg by mouth daily.     . RESTASIS 0.05 % ophthalmic emulsion     . rosuvastatin (CRESTOR) 10 MG tablet Take 1 tablet (10 mg total) by mouth daily. 90 tablet 3  . traMADol (ULTRAM) 50 MG tablet Take 1 tablet (50 mg total) by mouth at bedtime as needed. 90 tablet 1  . hydrochlorothiazide (HYDRODIURIL) 25 MG tablet Take 0.5 tablets (12.5 mg total) by mouth daily. 90 tablet 3   No current facility-administered medications for this visit.     Objective: BP 128/70 (BP Location: Left Arm, Patient Position: Sitting, Cuff Size: Normal)   Pulse 86   Temp 97.6 F (36.4 C) (Oral)   Ht 5\' 5"  (1.651 m)   Wt 158 lb 3.2 oz (71.8 kg)   SpO2 97%   BMI 26.33 kg/m  Gen: NAD, resting comfortably HEENT: Turbinates erythematous, TMs normal bilaterally, pharynx mildly erythematous with no  exudate or edema, + frontal and maxillary sinus tenderness CV: Normal rate and regular rhythm Lungs: CTAB no crackles, wheeze, rhonchi  Ext: no edema Skin: warm, dry, no rash Neuro: grossly normal, moves all extremities  Assessment/Plan: 1. Acute pansinusitis, recurrence not specified Symptoms are most consistent with sinusitis. Suspect an allergy component, advised continue zyrtec, nasal saline rinses, treat with doxycycline and provided a short course of prednisone due to sinus pressure which is most bothersome symptom for patient.  - predniSONE (DELTASONE) 20 MG tablet; Take 1 tablet (20 mg total) by mouth daily with breakfast.  Dispense: 5 tablet; Refill: 0 - doxycycline  (VIBRA-TABS) 100 MG tablet; Take 1 tablet (100 mg total) by mouth 2 (two) times daily.  Dispense: 20 tablet; Refill: 0  2. Seasonal allergic rhinitis due to pollen Suspect symptoms are related to an allergy component. Continue flonase and Zyrtec.  - fluticasone (FLONASE) 50 MCG/ACT nasal spray; Place 2 sprays into both nostrils daily.  Dispense: 16 g; Refill: 6    Finally, we reviewed reasons to return to care including if symptoms worsen or persist or new concerns arise- once again particularly shortness of breath or fever.  Laurita Quint, FNP

## 2018-06-03 NOTE — Patient Instructions (Signed)
It was a pleasure to meet you today.  An antibiotic has been provided for your today.   Prednisone has been provided for 5 days and should be taken with food.  Continue nasal saline rinses and use of flonase.  If symptoms do not improve, worsen, or new symptoms develop, follow up for further evaluation.   Sinusitis, Adult Sinusitis is soreness and swelling (inflammation) of your sinuses. Sinuses are hollow spaces in the bones around your face. They are located:  Around your eyes.  In the middle of your forehead.  Behind your nose.  In your cheekbones. Your sinuses and nasal passages are lined with a fluid called mucus. Mucus drains out of your sinuses. Swelling can trap mucus in your sinuses. This lets germs (bacteria, virus, or fungus) grow, which leads to infection. Most of the time, this condition is caused by a virus. What are the causes? This condition is caused by:  Allergies.  Asthma.  Germs.  Things that block your nose or sinuses.  Growths in the nose (nasal polyps).  Chemicals or irritants in the air.  Fungus (rare). What increases the risk? You are more likely to develop this condition if:  You have a weak body defense system (immune system).  You do a lot of swimming or diving.  You use nasal sprays too much.  You smoke. What are the signs or symptoms? The main symptoms of this condition are pain and a feeling of pressure around the sinuses. Other symptoms include:  Stuffy nose (congestion).  Runny nose (drainage).  Swelling and warmth in the sinuses.  Headache.  Toothache.  A cough that may get worse at night.  Mucus that collects in the throat or the back of the nose (postnasal drip).  Being unable to smell and taste.  Being very tired (fatigue).  A fever.  Sore throat.  Bad breath. How is this diagnosed? This condition is diagnosed based on:  Your symptoms.  Your medical history.  A physical exam.  Tests to find out if  your condition is short-term (acute) or long-term (chronic). Your doctor may: ? Check your nose for growths (polyps). ? Check your sinuses using a tool that has a light (endoscope). ? Check for allergies or germs. ? Do imaging tests, such as an MRI or CT scan. How is this treated? Treatment for this condition depends on the cause and whether it is short-term or long-term.  If caused by a virus, your symptoms should go away on their own within 10 days. You may be given medicines to relieve symptoms. They include: ? Medicines that shrink swollen tissue in the nose. ? Medicines that treat allergies (antihistamines). ? A spray that treats swelling of the nostrils. ? Rinses that help get rid of thick mucus in your nose (nasal saline washes).  If caused by bacteria, your doctor may wait to see if you will get better without treatment. You may be given antibiotic medicine if you have: ? A very bad infection. ? A weak body defense system.  If caused by growths in the nose, you may need to have surgery. Follow these instructions at home: Medicines  Take, use, or apply over-the-counter and prescription medicines only as told by your doctor. These may include nasal sprays.  If you were prescribed an antibiotic medicine, take it as told by your doctor. Do not stop taking the antibiotic even if you start to feel better. Hydrate and humidify   Drink enough water to keep your pee (urine)  pale yellow.  Use a cool mist humidifier to keep the humidity level in your home above 50%.  Breathe in steam for 10-15 minutes, 3-4 times a day, or as told by your doctor. You can do this in the bathroom while a hot shower is running.  Try not to spend time in cool or dry air. Rest  Rest as much as you can.  Sleep with your head raised (elevated).  Make sure you get enough sleep each night. General instructions   Put a warm, moist washcloth on your face 3-4 times a day, or as often as told by your  doctor. This will help with discomfort.  Wash your hands often with soap and water. If there is no soap and water, use hand sanitizer.  Do not smoke. Avoid being around people who are smoking (secondhand smoke).  Keep all follow-up visits as told by your doctor. This is important. Contact a doctor if:  You have a fever.  Your symptoms get worse.  Your symptoms do not get better within 10 days. Get help right away if:  You have a very bad headache.  You cannot stop throwing up (vomiting).  You have very bad pain or swelling around your face or eyes.  You have trouble seeing.  You feel confused.  Your neck is stiff.  You have trouble breathing. Summary  Sinusitis is swelling of your sinuses. Sinuses are hollow spaces in the bones around your face.  This condition is caused by tissues in your nose that become inflamed or swollen. This traps germs. These can lead to infection.  If you were prescribed an antibiotic medicine, take it as told by your doctor. Do not stop taking it even if you start to feel better.  Keep all follow-up visits as told by your doctor. This is important. This information is not intended to replace advice given to you by your health care provider. Make sure you discuss any questions you have with your health care provider. Document Released: 09/03/2007 Document Revised: 08/17/2017 Document Reviewed: 08/17/2017 Elsevier Interactive Patient Education  2019 [employer].

## 2018-06-14 ENCOUNTER — Other Ambulatory Visit: Payer: Self-pay | Admitting: Cardiovascular Disease

## 2018-06-14 DIAGNOSIS — E785 Hyperlipidemia, unspecified: Secondary | ICD-10-CM

## 2018-06-14 DIAGNOSIS — I2510 Atherosclerotic heart disease of native coronary artery without angina pectoris: Secondary | ICD-10-CM

## 2018-07-23 ENCOUNTER — Telehealth: Payer: Self-pay | Admitting: Internal Medicine

## 2018-07-23 NOTE — Telephone Encounter (Signed)
Patient has ativan on current med list unsure if she is taking, would you like a virtual visit to discuss?

## 2018-07-23 NOTE — Telephone Encounter (Signed)
See below

## 2018-07-23 NOTE — Telephone Encounter (Signed)
Patient requesting prescription for Xanax, she states that she needs something to help her relax

## 2018-07-24 ENCOUNTER — Other Ambulatory Visit: Payer: Self-pay | Admitting: Internal Medicine

## 2018-07-24 DIAGNOSIS — F411 Generalized anxiety disorder: Secondary | ICD-10-CM

## 2018-07-24 MED ORDER — LORAZEPAM 1 MG PO TABS: 0.5000 mg | ORAL_TABLET | Freq: Every evening | ORAL | 3 refills | Status: DC | PRN

## 2018-07-26 ENCOUNTER — Other Ambulatory Visit: Payer: Self-pay | Admitting: Internal Medicine

## 2018-07-26 DIAGNOSIS — F411 Generalized anxiety disorder: Secondary | ICD-10-CM

## 2018-07-26 MED ORDER — ALPRAZOLAM 0.25 MG PO TABS: 0.2500 mg | ORAL_TABLET | Freq: Two times a day (BID) | ORAL | 3 refills | Status: DC | PRN

## 2018-07-26 NOTE — Telephone Encounter (Signed)
Pt calling to follow up on this request for Xanax. Pt has no way for a virtual visit, and if the only way to talk to the dr is to come in, she will, but prefers not to if at all possible. Pt states she cannot relax, needs something to calm her down, as she has always been a  hyper person, and it seems to be getting worse.  Pt states the LORazepam does not phase her, and she never got it refilled. Her granddaughter is a Software engineer and recommends this for her. Please call back

## 2018-07-26 NOTE — Telephone Encounter (Signed)
Called pharmacy and pt has not picked up the Lorazepam. Pt is requesting alprazolam instead. Please advise.

## 2018-08-02 DIAGNOSIS — H1132 Conjunctival hemorrhage, left eye: Secondary | ICD-10-CM | POA: Diagnosis not present

## 2018-08-26 DIAGNOSIS — M65871 Other synovitis and tenosynovitis, right ankle and foot: Secondary | ICD-10-CM | POA: Diagnosis not present

## 2018-08-26 DIAGNOSIS — M25571 Pain in right ankle and joints of right foot: Secondary | ICD-10-CM | POA: Diagnosis not present

## 2018-08-26 DIAGNOSIS — M79671 Pain in right foot: Secondary | ICD-10-CM | POA: Diagnosis not present

## 2018-08-26 DIAGNOSIS — M19071 Primary osteoarthritis, right ankle and foot: Secondary | ICD-10-CM | POA: Diagnosis not present

## 2018-08-31 ENCOUNTER — Ambulatory Visit: Payer: PPO | Admitting: Cardiovascular Disease

## 2018-09-07 ENCOUNTER — Telehealth: Payer: Self-pay | Admitting: Cardiovascular Disease

## 2018-09-07 NOTE — Telephone Encounter (Signed)
Mychart, no smartphone, consent, pre reg complete 09/07/18 AF

## 2018-09-09 ENCOUNTER — Other Ambulatory Visit: Payer: Self-pay | Admitting: Cardiovascular Disease

## 2018-09-09 DIAGNOSIS — E785 Hyperlipidemia, unspecified: Secondary | ICD-10-CM

## 2018-09-09 DIAGNOSIS — I2510 Atherosclerotic heart disease of native coronary artery without angina pectoris: Secondary | ICD-10-CM

## 2018-09-14 ENCOUNTER — Telehealth (INDEPENDENT_AMBULATORY_CARE_PROVIDER_SITE_OTHER): Payer: PPO | Admitting: Cardiovascular Disease

## 2018-09-14 ENCOUNTER — Encounter: Payer: Self-pay | Admitting: Cardiovascular Disease

## 2018-09-14 ENCOUNTER — Telehealth: Payer: Self-pay

## 2018-09-14 DIAGNOSIS — Z9861 Coronary angioplasty status: Secondary | ICD-10-CM

## 2018-09-14 DIAGNOSIS — E785 Hyperlipidemia, unspecified: Secondary | ICD-10-CM | POA: Diagnosis not present

## 2018-09-14 DIAGNOSIS — I10 Essential (primary) hypertension: Secondary | ICD-10-CM

## 2018-09-14 DIAGNOSIS — I2510 Atherosclerotic heart disease of native coronary artery without angina pectoris: Secondary | ICD-10-CM | POA: Diagnosis not present

## 2018-09-14 DIAGNOSIS — I447 Left bundle-branch block, unspecified: Secondary | ICD-10-CM

## 2018-09-14 NOTE — Telephone Encounter (Signed)

## 2018-09-14 NOTE — Telephone Encounter (Signed)
Patient and/or DPR-approved person aware of 6/16 AVS instructions and verbalized understanding.  AVS TO BE RELEASED TO [facility]

## 2018-09-14 NOTE — Progress Notes (Signed)
Virtual Visit via Telephone Note   This visit type was conducted due to national recommendations for restrictions regarding the COVID-19 Pandemic (e.g. social distancing) in an effort to limit this patient's exposure and mitigate transmission in our community.  Due to her co-morbid illnesses, this patient is at least at moderate risk for complications without adequate follow up.  This format is felt to be most appropriate for this patient at this time.  The patient did not have access to video technology/had technical difficulties with video requiring transitioning to audio format only (telephone).  All issues noted in this document were discussed and addressed.  No physical exam could be performed with this format.  Please refer to the patient's chart for her  consent to telehealth for [facility].   Date:  09/14/2018   ID:  Krystal [name], DOB July 27, 1929, MRN [id number]  Patient Location: Home Provider Location: Home  PCP:  Janith Lima, MD  Cardiologist:  Quay Burow, MD  Electrophysiologist:  None   Evaluation Performed:  Follow-Up Visit  Chief Complaint: Follow-up hypertension and hyperlipidemia  History of Present Illness:    Krystal [name] is a 85 y.o.  mildly overweight married Caucasian female mother of 2 children, grandmother of 3 grandchildren, whose husband Krystal [name] is also a patient of mine. They were both patients of Dr. Georgiann Mccoy. I last saw her in the office  08/28/2017. Her past history is remarkable for treated hypertension and hyperlipidemia. Her father did die of a myocardial infarction. She has never had a heart attack or stroke. She does not smoke. She had a Cypher drug-eluting stent placed in her circumflex coronary artery by Dr. Eustace Quail in 2003. Her other arteries were normal as was her LV function. She denies chest pain or shortness of breath. She is very active and walks for an hour every morning.   She saw Kerin Ransom, PA-C in the office  01/29/2018 and was doing well.  She walks daily and does her chores without limitation or symptoms.  She denies chest pain or shortness of breath.   The patient does not have symptoms concerning for COVID-19 infection (fever, chills, cough, or new shortness of breath).    Past Medical History:  Diagnosis Date  . Adenomatous colon polyp   . Allergy    seasonal  . Anemia    pt. denies  . Anxiety   . CAD (coronary artery disease)   . Cataract    cataracts removed left eye.  . Chronic lower back pain   . Diverticulosis   . GERD (gastroesophageal reflux disease)   . Headache(784.0)   . Hiatal hernia   . Hyperlipidemia   . Hypertension   . IBS (irritable bowel syndrome) 10/13/2011  . Osteoarthritis   . Osteoporosis   . Renal cysts, acquired, bilateral   . SBO (small bowel obstruction) ([place]) 09/05/2015  . Venous insufficiency    Past Surgical History:  Procedure Laterality Date  . ABDOMINAL HYSTERECTOMY    . BLADDER REPAIR    . CARDIAC CATHETERIZATION  01/12/2002   90% stenosis of the left circumflex, stented with a 3x59mm Cypher stent, resulting in reduction of 90% to 10%   . CARDIOVASCULAR STRESS TEST  03/22/2007   Mild inferolateral thinning toward the apex without significant ischemia. Nondiagnostic electrocardiogram.  . CATARACT EXTRACTION     bilateral  . COLONOSCOPY  08/17/2008   adenomatous polyp, diverticulosis, external hemorrhoids  . COLONOSCOPY W/ BIOPSIES     multiple   .  LEFT LOWER EXTREMITY VENOUS DOPPLER  06/18/2011   No evidence of left lower extremity DVT  . TRANSTHORACIC ECHOCARDIOGRAM  11/18/2012   EF 51-02%, LV systolic mild-moderately reduced, mild-moderate mitral valve regurg, mild-moderate tricuspid valve regurg. NSR-LBBB with occas PVCs  . UPPER GASTROINTESTINAL ENDOSCOPY  07/03/2010   hiatal hernia  . VARICOSE VEIN SURGERY       No outpatient medications have been marked as taking for the 09/14/18 encounter (Appointment) with Lorretta Harp, MD.      Allergies:   Atorvastatin, Simvastatin, Alendronate sodium, Cefdinir, Esomeprazole magnesium, Levofloxacin, Omeprazole, Other, Penicillins, and Shellfish-derived products   Social History   Tobacco Use  . Smoking status: Never Smoker  . Smokeless tobacco: Never Used  Substance Use Topics  . Alcohol use: No    Alcohol/week: 0.0 standard drinks  . Drug use: No     Family Hx: The patient's family history includes Heart disease in her father; Stroke in her sister. There is no history of Colon cancer.  ROS:   Please see the history of present illness.     All other systems reviewed and are negative.   Prior CV studies:   The following studies were reviewed today:  None  Labs/Other Tests and Data Reviewed:    EKG:  No ECG reviewed.  Recent Labs: 10/19/2017: BUN 20; Creatinine, Ser 0.92; Magnesium 2.2; Potassium 3.9; Sodium 139   Recent Lipid Panel Lab Results  Component Value Date/Time   CHOL 137 04/28/2017 08:48 AM   TRIG 115 04/28/2017 08:48 AM   HDL 44 04/28/2017 08:48 AM   CHOLHDL 3.1 04/28/2017 08:48 AM   CHOLHDL 3.2 04/24/2016 08:15 AM   LDLCALC 70 04/28/2017 08:48 AM   LDLDIRECT 160.4 12/19/2008 08:15 AM    Wt Readings from Last 3 Encounters:  06/03/18 158 lb 3.2 oz (71.8 kg)  05/05/18 156 lb (70.8 kg)  04/28/18 156 lb (70.8 kg)     Objective:    Vital Signs:  There were no vitals taken for this visit.   VITAL SIGNS:  reviewed a complete physical exam was not performed today since this was a virtual telemedicine phone visit  ASSESSMENT & PLAN:    1. Essential hypertension- history of essential hypertension with blood pressure measured by the patient at home today of 135/77.  She is on metoprolol, amlodipine, losartan and hydrochlorothiazide. 2. Hyperlipidemia- history of hyperlipidemia on Crestor lipid profile performed 04/28/2017 revealing total cluster 137, LDL 70 and HDL 44.  COVID-19 Education: The signs and symptoms of COVID-19 were discussed  with the patient and how to seek care for testing (follow up with PCP or arrange E-visit).  The importance of social distancing was discussed today.  Time:   Today, I have spent 4 minutes with the patient with telehealth technology discussing the above problems.     Medication Adjustments/Labs and Tests Ordered: Current medicines are reviewed at length with the patient today.  Concerns regarding medicines are outlined above.   Tests Ordered: No orders of the defined types were placed in this encounter.   Medication Changes: No orders of the defined types were placed in this encounter.   Follow Up:  In Person in 1 year(s)  Signed, Quay Burow, MD  09/14/2018 11:33 AM    [place]

## 2018-09-14 NOTE — Patient Instructions (Signed)

## 2018-10-12 ENCOUNTER — Telehealth: Payer: Self-pay | Admitting: Internal Medicine

## 2018-10-12 ENCOUNTER — Ambulatory Visit (INDEPENDENT_AMBULATORY_CARE_PROVIDER_SITE_OTHER): Payer: PPO | Admitting: Internal Medicine

## 2018-10-12 ENCOUNTER — Encounter: Payer: Self-pay | Admitting: Internal Medicine

## 2018-10-12 DIAGNOSIS — J301 Allergic rhinitis due to pollen: Secondary | ICD-10-CM

## 2018-10-12 MED ORDER — TRIAMCINOLONE ACETONIDE 55 MCG/ACT NA AERO: 2.0000 | INHALATION_SPRAY | Freq: Every day | NASAL | 12 refills | Status: DC

## 2018-10-12 MED ORDER — PREDNISONE 10 MG PO TABS: ORAL_TABLET | ORAL | 0 refills | Status: DC

## 2018-10-12 NOTE — Assessment & Plan Note (Signed)
See notes

## 2018-10-12 NOTE — Telephone Encounter (Signed)
Patient is requesting medication prednisone due to her mucus build up.  Call back [phone] [id number] [address], [place] [phone] (Phone) [phone] (Fax)

## 2018-10-12 NOTE — Patient Instructions (Addendum)
Ok to change the flonase to nasacort  Please take all new medication as prescribed - the prednisone  Please continue all other medications as before, including the saline and zyrtec.  Please have the pharmacy call with any other refills you may need.  Please keep your appointments with your specialists as you may have planned

## 2018-10-12 NOTE — Telephone Encounter (Signed)
She needs a virtual visit or UC

## 2018-10-12 NOTE — Progress Notes (Signed)
Patient ID: Krystal [name], female   DOB: 1929/08/18, 85 y.o.   MRN: [id number]  Phone only  Cumulative time during 7-day interval 12 min, there was not an associated office visit for this concern within a 7 day period.  Verbal consent for services obtained from zyrteatient prior to services given.  Names of all persons present for services: Cathlean Cower, MD, patient  Chief complaint: nasal congestion  History, background, results pertinent:  Does have several wks ongoing nasal allergy symptoms with clearish congestion, itch and sneezing, without fever, pain, ST, cough, swelling or wheezing.  Has marked nasal congestion despite zyrtec and saline.  Has not been using the flonase due to nosebleeds with this   Pt denies fever, wt loss, night sweats, loss of appetite, or other constitutional symptoms  Predniosone has helped before  Past Medical History:  Diagnosis Date  . Adenomatous colon polyp   . Allergy    seasonal  . Anemia    pt. denies  . Anxiety   . CAD (coronary artery disease)   . Cataract    cataracts removed left eye.  . Chronic lower back pain   . Diverticulosis   . GERD (gastroesophageal reflux disease)   . Headache(784.0)   . Hiatal hernia   . Hyperlipidemia   . Hypertension   . IBS (irritable bowel syndrome) 10/13/2011  . Osteoarthritis   . Osteoporosis   . Renal cysts, acquired, bilateral   . SBO (small bowel obstruction) ([place]) 09/05/2015  . Venous insufficiency    No results found for this or any previous visit (from the past 48 hour(s)).   A/P/next steps:   Allergic Rhinitis - with seasonal flare, to continue zyrtec, change flonase to nasacort, continue saline, and predpac asd,  to f/u any worsening symptoms or concerns  Cathlean Cower MD

## 2018-10-12 NOTE — Telephone Encounter (Signed)
Phone call appointment has been made for today with Dr.John. Pt. Is unable to do a virtual visit.

## 2018-10-21 ENCOUNTER — Other Ambulatory Visit: Payer: Self-pay | Admitting: Internal Medicine

## 2018-10-27 ENCOUNTER — Other Ambulatory Visit: Payer: Self-pay | Admitting: Internal Medicine

## 2018-11-01 ENCOUNTER — Other Ambulatory Visit: Payer: Self-pay

## 2018-11-06 ENCOUNTER — Other Ambulatory Visit: Payer: Self-pay | Admitting: Internal Medicine

## 2018-11-08 ENCOUNTER — Other Ambulatory Visit: Payer: Self-pay

## 2018-11-08 ENCOUNTER — Encounter: Payer: Self-pay | Admitting: Internal Medicine

## 2018-11-08 ENCOUNTER — Ambulatory Visit (INDEPENDENT_AMBULATORY_CARE_PROVIDER_SITE_OTHER): Payer: PPO | Admitting: Internal Medicine

## 2018-11-08 ENCOUNTER — Other Ambulatory Visit (INDEPENDENT_AMBULATORY_CARE_PROVIDER_SITE_OTHER): Payer: PPO

## 2018-11-08 VITALS — BP 136/74 | HR 78 | Temp 98.0°F | Resp 16 | Ht 65.0 in | Wt 157.0 lb

## 2018-11-08 DIAGNOSIS — I10 Essential (primary) hypertension: Secondary | ICD-10-CM

## 2018-11-08 DIAGNOSIS — K5904 Chronic idiopathic constipation: Secondary | ICD-10-CM | POA: Diagnosis not present

## 2018-11-08 DIAGNOSIS — Z9861 Coronary angioplasty status: Secondary | ICD-10-CM

## 2018-11-08 DIAGNOSIS — N183 Chronic kidney disease, stage 3 unspecified: Secondary | ICD-10-CM | POA: Insufficient documentation

## 2018-11-08 DIAGNOSIS — R739 Hyperglycemia, unspecified: Secondary | ICD-10-CM | POA: Diagnosis not present

## 2018-11-08 DIAGNOSIS — I2510 Atherosclerotic heart disease of native coronary artery without angina pectoris: Secondary | ICD-10-CM | POA: Diagnosis not present

## 2018-11-08 DIAGNOSIS — E785 Hyperlipidemia, unspecified: Secondary | ICD-10-CM

## 2018-11-08 LAB — HEMOGLOBIN A1C: Hgb A1c MFr Bld: 6 % (ref 4.6–6.5)

## 2018-11-08 LAB — LIPID PANEL
Cholesterol: 132 mg/dL (ref 0–200)
HDL: 44.5 mg/dL (ref >39.00)
LDL Cholesterol: 61 mg/dL (ref 0–99)
NonHDL: 87.61
Total CHOL/HDL Ratio: 3
Triglycerides: 133 mg/dL (ref 0.0–149.0)
VLDL: 26.6 mg/dL (ref 0.0–40.0)

## 2018-11-08 LAB — HEPATIC FUNCTION PANEL
ALT: 13 U/L (ref 0–35)
AST: 18 U/L (ref 0–37)
Albumin: 4.2 g/dL (ref 3.5–5.2)
Alkaline Phosphatase: 94 U/L (ref 39–117)
Bilirubin, Direct: 0.1 mg/dL (ref 0.0–0.3)
Total Bilirubin: 0.4 mg/dL (ref 0.2–1.2)
Total Protein: 6.7 g/dL (ref 6.0–8.3)

## 2018-11-08 LAB — BASIC METABOLIC PANEL
BUN: 22 mg/dL (ref 6–23)
CO2: 29 mEq/L (ref 19–32)
Calcium: 9.4 mg/dL (ref 8.4–10.5)
Chloride: 102 mEq/L (ref 96–112)
Creatinine, Ser: 0.92 mg/dL (ref 0.40–1.20)
GFR: 57.77 mL/min — ABNORMAL LOW (ref >60.00)
Glucose, Bld: 89 mg/dL (ref 70–99)
Potassium: 3.9 mEq/L (ref 3.5–5.1)
Sodium: 140 mEq/L (ref 135–145)

## 2018-11-08 LAB — CBC WITH DIFFERENTIAL/PLATELET
Basophils Absolute: 0 10*3/uL (ref 0.0–0.1)
Basophils Relative: 0.5 % (ref 0.0–3.0)
Eosinophils Absolute: 0.2 10*3/uL (ref 0.0–0.7)
Eosinophils Relative: 2 % (ref 0.0–5.0)
HCT: 41.9 % (ref 36.0–46.0)
Hemoglobin: 14 g/dL (ref 12.0–15.0)
Lymphocytes Relative: 34.9 % (ref 12.0–46.0)
Lymphs Abs: 2.7 10*3/uL (ref 0.7–4.0)
MCHC: 33.5 g/dL (ref 30.0–36.0)
MCV: 94.5 fl (ref 78.0–100.0)
Monocytes Absolute: 0.6 10*3/uL (ref 0.1–1.0)
Monocytes Relative: 7.8 % (ref 3.0–12.0)
Neutro Abs: 4.3 10*3/uL (ref 1.4–7.7)
Neutrophils Relative %: 54.8 % (ref 43.0–77.0)
Platelets: 220 10*3/uL (ref 150.0–400.0)
RBC: 4.43 Mil/uL (ref 3.87–5.11)
RDW: 14.8 % (ref 11.5–15.5)
WBC: 7.8 10*3/uL (ref 4.0–10.5)

## 2018-11-08 LAB — TSH: TSH: 1.62 u[IU]/mL (ref 0.35–4.50)

## 2018-11-08 MED ORDER — LOSARTAN POTASSIUM 50 MG PO TABS: 50.0000 mg | ORAL_TABLET | Freq: Every day | ORAL | 1 refills | Status: DC

## 2018-11-08 MED ORDER — METOPROLOL SUCCINATE ER 25 MG PO TB24: 25.0000 mg | ORAL_TABLET | Freq: Every day | ORAL | 1 refills | Status: DC

## 2018-11-08 MED ORDER — AMLODIPINE BESYLATE 10 MG PO TABS: 10.0000 mg | ORAL_TABLET | Freq: Every day | ORAL | 1 refills | Status: DC

## 2018-11-08 NOTE — Progress Notes (Signed)
Subjective:  Patient ID: Krystal [name], female    DOB: Dec 18, 1929  Age: 85 y.o. MRN: [id number]  CC: Hypertension, Hyperlipidemia, and Coronary Artery Disease   HPI Krystal [name] presents for f/up - She has felt well recently and offers no complaints.  She walks about 45 minutes a day and does not experience fatigue, DOE, palpitations, or edema.  Outpatient Medications Prior to Visit  Medication Sig Dispense Refill  . acetaminophen (TYLENOL) 500 MG tablet Take 500 mg by mouth every 4 (four) hours as needed for mild pain or headache.    . ALPRAZolam (XANAX) 0.25 MG tablet Take 1 tablet (0.25 mg total) by mouth 2 (two) times daily as needed for anxiety. 60 tablet 3  . calcium carbonate (OS-CAL) 600 MG TABS tablet Take 600 mg by mouth 2 (two) times daily with a meal.    . cholecalciferol (VITAMIN D) 1000 UNITS tablet Take 2,000 Units by mouth daily.     Marland Kitchen gabapentin (NEURONTIN) 100 MG capsule TAKE 1 CAPSULE BY MOUTH EVERY DAY AT BEDTIME AS NEEDED 90 capsule 1  . lansoprazole (PREVACID) 15 MG capsule TAKE 1 CAPSULE BY MOUTH TWICE A DAY BEFORE MEALS 180 capsule 3  . PROAIR HFA 108 (90 Base) MCG/ACT inhaler TAKE 2 PUFFS BY MOUTH EVERY 6 HOURS AS NEEDED FOR WHEEZE OR SHORTNESS OF BREATH 8.5 Inhaler 2  . Probiotic Product (ALIGN) 4 MG CAPS Take 4 mg by mouth as needed.     . RESTASIS 0.05 % ophthalmic emulsion     . rosuvastatin (CRESTOR) 10 MG tablet TAKE 1 TABLET BY MOUTH EVERY DAY 90 tablet 0  . traMADol (ULTRAM) 50 MG tablet Take 1 tablet (50 mg total) by mouth at bedtime as needed. 90 tablet 1  . triamcinolone (NASACORT) 55 MCG/ACT AERO nasal inhaler Place 2 sprays into the nose daily. 1 Inhaler 12  . amLODipine (NORVASC) 10 MG tablet TAKE 1 TABLET BY MOUTH EVERY DAY 90 tablet 1  . dicyclomine (BENTYL) 10 MG capsule Every 6 hours as needed abdominal cramps 60 capsule 1  . losartan (COZAAR) 50 MG tablet TAKE 1 TABLET BY MOUTH EVERY DAY 90 tablet 1  . meloxicam (MOBIC) 7.5 MG tablet Take 1  tablet (7.5 mg total) by mouth daily. (Patient taking differently: Take 7.5 mg by mouth as needed. ) 30 tablet 0  . metoprolol succinate (TOPROL-XL) 25 MG 24 hr tablet TAKE 1 TABLET BY MOUTH EVERY DAY 90 tablet 1  . predniSONE (DELTASONE) 10 MG tablet 3 tabs by mouth per day for 3 days,2tabs per day for 3 days,1tab per day for 3 days 18 tablet 0  . hydrochlorothiazide (HYDRODIURIL) 25 MG tablet Take 0.5 tablets (12.5 mg total) by mouth daily. 90 tablet 3   No facility-administered medications prior to visit.     ROS Review of Systems  Constitutional: Negative.  Negative for diaphoresis and fatigue.  HENT: Negative.   Eyes: Negative for visual disturbance.  Respiratory: Negative for cough, chest tightness, shortness of breath and wheezing.   Cardiovascular: Negative for chest pain, palpitations and leg swelling.  Gastrointestinal: Positive for constipation. Negative for abdominal pain, diarrhea, nausea and vomiting.  Endocrine: Negative.   Genitourinary: Negative.  Negative for difficulty urinating.  Musculoskeletal: Negative for arthralgias and myalgias.  Skin: Negative.  Negative for color change, pallor and rash.  Neurological: Negative.  Negative for dizziness, weakness and light-headedness.  Hematological: Negative for adenopathy. Does not bruise/bleed easily.  Psychiatric/Behavioral: Negative.     Objective:  BP 136/74 (BP Location: Left Arm, Patient Position: Sitting, Cuff Size: Normal)   Pulse 78   Temp 98 F (36.7 C) (Oral)   Resp 16   Ht 5\' 5"  (1.651 m)   Wt 157 lb (71.2 kg)   SpO2 96%   BMI 26.13 kg/m   BP Readings from Last 3 Encounters:  11/08/18 136/74  06/03/18 128/70  04/07/18 138/78    Wt Readings from Last 3 Encounters:  11/08/18 157 lb (71.2 kg)  06/03/18 158 lb 3.2 oz (71.8 kg)  05/05/18 156 lb (70.8 kg)    Physical Exam Vitals signs reviewed.  Constitutional:      Appearance: She is not ill-appearing or diaphoretic.  HENT:     Nose: Nose  normal. No rhinorrhea.     Mouth/Throat:     Mouth: Mucous membranes are moist.  Eyes:     General: No scleral icterus.    Conjunctiva/sclera: Conjunctivae normal.  Neck:     Musculoskeletal: Normal range of motion. No neck rigidity or muscular tenderness.  Cardiovascular:     Rate and Rhythm: Normal rate and regular rhythm.     Heart sounds: No murmur.  Pulmonary:     Effort: Pulmonary effort is normal.     Breath sounds: No stridor. No wheezing, rhonchi or rales.  Abdominal:     General: Abdomen is flat. Bowel sounds are normal. There is no distension.     Palpations: Abdomen is soft. There is no hepatomegaly, splenomegaly or mass.     Tenderness: There is no abdominal tenderness. There is guarding.  Musculoskeletal: Normal range of motion.     Right lower leg: No edema.     Left lower leg: No edema.  Lymphadenopathy:     Cervical: No cervical adenopathy.  Skin:    General: Skin is warm and dry.     Coloration: Skin is not pale.  Neurological:     General: No focal deficit present.     Mental Status: She is alert and oriented to person, place, and time. Mental status is at baseline.  Psychiatric:        Mood and Affect: Mood normal.        Behavior: Behavior normal.     Lab Results  Component Value Date   WBC 7.8 11/08/2018   HGB 14.0 11/08/2018   HCT 41.9 11/08/2018   PLT 220.0 11/08/2018   GLUCOSE 89 11/08/2018   CHOL 132 11/08/2018   TRIG 133.0 11/08/2018   HDL 44.50 11/08/2018   LDLDIRECT 160.4 12/19/2008   LDLCALC 61 11/08/2018   ALT 13 11/08/2018   AST 18 11/08/2018   NA 140 11/08/2018   K 3.9 11/08/2018   CL 102 11/08/2018   CREATININE 0.92 11/08/2018   BUN 22 11/08/2018   CO2 29 11/08/2018   TSH 1.62 11/08/2018   HGBA1C 6.0 11/08/2018    Dg Shoulder Right  Result Date: 12/11/2017 CLINICAL DATA:  Lateral right shoulder pain since a fall 1 week ago. EXAM: RIGHT SHOULDER - 2+ VIEW COMPARISON:  None. FINDINGS: Diffuse osteopenia. No fracture or  dislocation seen. IMPRESSION: No fracture or dislocation. Electronically Signed   By: Claudie Revering M.D.   On: 12/11/2017 11:38    Assessment & Plan:   Kaileia was seen today for hypertension, hyperlipidemia and coronary artery disease.  Diagnoses and all orders for this visit:  Essential hypertension- Her blood pressure is adequately well controlled.  Electrolytes and renal function are normal.  Will continue the combination of  losartan, metoprolol, and amlodipine. -     losartan (COZAAR) 50 MG tablet; Take 1 tablet (50 mg total) by mouth daily. -     metoprolol succinate (TOPROL-XL) 25 MG 24 hr tablet; Take 1 tablet (25 mg total) by mouth daily. -     amLODipine (NORVASC) 10 MG tablet; Take 1 tablet (10 mg total) by mouth daily. -     CBC with Differential/Platelet; Future -     Basic metabolic panel; Future  CAD S/P percutaneous coronary angioplasty- She has had no recent episodes of angina.  Will continue risk factor modifications. -     metoprolol succinate (TOPROL-XL) 25 MG 24 hr tablet; Take 1 tablet (25 mg total) by mouth daily. -     Lipid panel; Future  Hyperlipidemia with target LDL less than 70-she has achieved her LDL goal is doing well on the statin. -     Lipid panel; Future -     TSH; Future -     Hepatic function panel; Future  Hyperglycemia- Her A1c is at 6.0%.  She is prediabetic.  Medical therapy is not indicated. -     Basic metabolic panel; Future -     Hemoglobin A1c; Future  Chronic idiopathic constipation- Labs are negative for secondary causes.  She is getting adequate symptom relief with a probiotic. -     Basic metabolic panel; Future -     TSH; Future  Chronic renal disease, stage 3, moderately decreased glomerular filtration rate (GFR) between 30-59 mL/min/1.73 square meter ([place])- I have advised her to avoid nephrotoxic agents.  Will continue to maintain control of her blood pressure.   I have discontinued Valincia L. Munter's meloxicam, dicyclomine, and  predniSONE. I have also changed her losartan, metoprolol succinate, and amLODipine. Additionally, I am having her maintain her Align, cholecalciferol, calcium carbonate, acetaminophen, traMADol, ProAir HFA, hydrochlorothiazide, gabapentin, lansoprazole, Restasis, ALPRAZolam, rosuvastatin, and triamcinolone.  Meds ordered this encounter  Medications  . losartan (COZAAR) 50 MG tablet    Sig: Take 1 tablet (50 mg total) by mouth daily.    Dispense:  90 tablet    Refill:  1  . metoprolol succinate (TOPROL-XL) 25 MG 24 hr tablet    Sig: Take 1 tablet (25 mg total) by mouth daily.    Dispense:  90 tablet    Refill:  1  . amLODipine (NORVASC) 10 MG tablet    Sig: Take 1 tablet (10 mg total) by mouth daily.    Dispense:  90 tablet    Refill:  1     Follow-up: Return in about 6 months (around 05/11/2019).  Scarlette Calico, MD

## 2018-11-08 NOTE — Patient Instructions (Signed)

## 2018-12-09 ENCOUNTER — Other Ambulatory Visit: Payer: Self-pay | Admitting: Cardiovascular Disease

## 2018-12-09 DIAGNOSIS — E785 Hyperlipidemia, unspecified: Secondary | ICD-10-CM

## 2018-12-09 DIAGNOSIS — I2510 Atherosclerotic heart disease of native coronary artery without angina pectoris: Secondary | ICD-10-CM

## 2018-12-20 DIAGNOSIS — Z01419 Encounter for gynecological examination (general) (routine) without abnormal findings: Secondary | ICD-10-CM | POA: Diagnosis not present

## 2018-12-20 DIAGNOSIS — Z1231 Encounter for screening mammogram for malignant neoplasm of breast: Secondary | ICD-10-CM | POA: Diagnosis not present

## 2018-12-29 ENCOUNTER — Ambulatory Visit (INDEPENDENT_AMBULATORY_CARE_PROVIDER_SITE_OTHER): Payer: PPO | Admitting: Internal Medicine

## 2018-12-29 ENCOUNTER — Other Ambulatory Visit: Payer: Self-pay

## 2018-12-29 ENCOUNTER — Encounter: Payer: Self-pay | Admitting: Internal Medicine

## 2018-12-29 VITALS — BP 152/74 | HR 80 | Temp 98.2°F | Ht 65.0 in | Wt 156.0 lb

## 2018-12-29 DIAGNOSIS — Z91038 Other insect allergy status: Secondary | ICD-10-CM | POA: Insufficient documentation

## 2018-12-29 MED ORDER — METHYLPREDNISOLONE ACETATE 80 MG/ML IJ SUSP
120.0000 mg | Freq: Once | INTRAMUSCULAR | Status: AC
  Administered 2018-12-29: 120 mg via INTRAMUSCULAR

## 2018-12-29 MED ORDER — TRIAMCINOLONE ACETONIDE 0.5 % EX CREA: 1.0000 "application " | TOPICAL_CREAM | Freq: Three times a day (TID) | CUTANEOUS | 0 refills | Status: DC

## 2018-12-29 MED ORDER — LEVOCETIRIZINE DIHYDROCHLORIDE 5 MG PO TABS: 5.0000 mg | ORAL_TABLET | Freq: Every evening | ORAL | 0 refills | Status: DC

## 2018-12-29 NOTE — Patient Instructions (Signed)

## 2018-12-29 NOTE — Progress Notes (Signed)
Subjective:  Patient ID: Krystal [name], female    DOB: [birth year]  Age: 85 y.o. MRN: [id number]  CC: Rash   HPI Krystal [name] presents for concerns about a rash on her face and neck.  She noticed the rash 4 days ago.  The rash causes itching and burning.  One day prior to the development of the rash she had been cought in a mosquito swarm at [facility].  She has not gotten much symptom relief from over-the-counter treatment options.  She got a Shingrix vaccine 1 month ago.  Outpatient Medications Prior to Visit  Medication Sig Dispense Refill  . acetaminophen (TYLENOL) 500 MG tablet Take 500 mg by mouth every 4 (four) hours as needed for mild pain or headache.    . ALPRAZolam (XANAX) 0.25 MG tablet Take 1 tablet (0.25 mg total) by mouth 2 (two) times daily as needed for anxiety. 60 tablet 3  . amLODipine (NORVASC) 10 MG tablet Take 1 tablet (10 mg total) by mouth daily. 90 tablet 1  . calcium carbonate (OS-CAL) 600 MG TABS tablet Take 600 mg by mouth 2 (two) times daily with a meal.    . cholecalciferol (VITAMIN D) 1000 UNITS tablet Take 2,000 Units by mouth daily.     Marland Kitchen gabapentin (NEURONTIN) 100 MG capsule TAKE 1 CAPSULE BY MOUTH EVERY DAY AT BEDTIME AS NEEDED 90 capsule 1  . lansoprazole (PREVACID) 15 MG capsule TAKE 1 CAPSULE BY MOUTH TWICE A DAY BEFORE MEALS 180 capsule 3  . losartan (COZAAR) 50 MG tablet Take 1 tablet (50 mg total) by mouth daily. 90 tablet 1  . metoprolol succinate (TOPROL-XL) 25 MG 24 hr tablet Take 1 tablet (25 mg total) by mouth daily. 90 tablet 1  . PROAIR HFA 108 (90 Base) MCG/ACT inhaler TAKE 2 PUFFS BY MOUTH EVERY 6 HOURS AS NEEDED FOR WHEEZE OR SHORTNESS OF BREATH 8.5 Inhaler 2  . Probiotic Product (ALIGN) 4 MG CAPS Take 4 mg by mouth as needed.     . RESTASIS 0.05 % ophthalmic emulsion     . rosuvastatin (CRESTOR) 10 MG tablet TAKE 1 TABLET BY MOUTH EVERY DAY 90 tablet 2  . traMADol (ULTRAM) 50 MG tablet Take 1 tablet (50 mg total) by mouth at  bedtime as needed. 90 tablet 1  . triamcinolone (NASACORT) 55 MCG/ACT AERO nasal inhaler Place 2 sprays into the nose daily. 1 Inhaler 12  . hydrochlorothiazide (HYDRODIURIL) 25 MG tablet Take 0.5 tablets (12.5 mg total) by mouth daily. 90 tablet 3   No facility-administered medications prior to visit.     ROS Review of Systems  Constitutional: Negative for chills, diaphoresis and fever.  HENT: Negative.  Negative for sore throat and trouble swallowing.   Eyes: Negative for visual disturbance.  Respiratory: Negative for cough, chest tightness, shortness of breath and wheezing.   Cardiovascular: Negative for chest pain, palpitations and leg swelling.  Gastrointestinal: Negative for abdominal pain, constipation, diarrhea, nausea and vomiting.  Endocrine: Negative.   Genitourinary: Negative for difficulty urinating.  Musculoskeletal: Negative for arthralgias and myalgias.  Skin: Positive for rash. Negative for color change.  Neurological: Negative.  Negative for dizziness, weakness and light-headedness.  Hematological: Negative for adenopathy. Does not bruise/bleed easily.  Psychiatric/Behavioral: Negative.     Objective:  BP (!) 152/74 (BP Location: Left Arm, Patient Position: Sitting, Cuff Size: Normal)   Pulse 80   Temp 98.2 F (36.8 C) (Oral)   Ht 5\' 5"  (  1.651 m)   Wt 156 lb (70.8 kg)   SpO2 97%   BMI 25.96 kg/m   BP Readings from Last 3 Encounters:  12/29/18 (!) 152/74  11/08/18 136/74  06/03/18 128/70    Wt Readings from Last 3 Encounters:  12/29/18 156 lb (70.8 kg)  11/08/18 157 lb (71.2 kg)  06/03/18 158 lb 3.2 oz (71.8 kg)    Physical Exam Vitals signs reviewed.  Constitutional:      Appearance: Normal appearance. She is not ill-appearing or diaphoretic.  HENT:     Mouth/Throat:     Mouth: Mucous membranes are moist.  Eyes:     General: No scleral icterus.    Conjunctiva/sclera: Conjunctivae normal.  Neck:     Musculoskeletal: Normal range of motion. No  muscular tenderness.  Cardiovascular:     Rate and Rhythm: Normal rate and regular rhythm.     Heart sounds: No murmur.  Pulmonary:     Effort: Pulmonary effort is normal.     Breath sounds: No stridor. No wheezing, rhonchi or rales.  Abdominal:     General: Abdomen is flat. Bowel sounds are normal. There is no distension.     Palpations: Abdomen is soft. There is no hepatomegaly or splenomegaly.     Tenderness: There is no abdominal tenderness.  Musculoskeletal: Normal range of motion.     Right lower leg: No edema.     Left lower leg: No edema.  Lymphadenopathy:     Cervical: No cervical adenopathy.  Skin:    General: Skin is dry.     Findings: Rash present.     Comments: There are 1 to 2 cm round erythematous macules located on both sides of the face and the right neck.  See photos.  There are no vesicles, pustules, streaking, tenderness, warmth, induration, or fluctuance.  Neurological:     General: No focal deficit present.     Mental Status: She is alert.  Psychiatric:        Mood and Affect: Mood normal.        Behavior: Behavior normal.     Lab Results  Component Value Date   WBC 7.8 11/08/2018   HGB 14.0 11/08/2018   HCT 41.9 11/08/2018   PLT 220.0 11/08/2018   GLUCOSE 89 11/08/2018   CHOL 132 11/08/2018   TRIG 133.0 11/08/2018   HDL 44.50 11/08/2018   LDLDIRECT 160.4 12/19/2008   LDLCALC 61 11/08/2018   ALT 13 11/08/2018   AST 18 11/08/2018   NA 140 11/08/2018   K 3.9 11/08/2018   CL 102 11/08/2018   CREATININE 0.92 11/08/2018   BUN 22 11/08/2018   CO2 29 11/08/2018   TSH 1.62 11/08/2018   HGBA1C 6.0 11/08/2018    Dg Shoulder Right  Result Date: 12/11/2017 CLINICAL DATA:  Lateral right shoulder pain since a fall 1 week ago. EXAM: RIGHT SHOULDER - 2+ VIEW COMPARISON:  None. FINDINGS: Diffuse osteopenia. No fracture or dislocation seen. IMPRESSION: No fracture or dislocation. Electronically Signed   By: Claudie Revering M.D.   On: 12/11/2017 11:38     Assessment & Plan:   Bryssa was seen today for rash.  Diagnoses and all orders for this visit:  Allergic reaction to insect bite -     levocetirizine (XYZAL) 5 MG tablet; Take 1 tablet (5 mg total) by mouth every evening. -     triamcinolone cream (KENALOG) 0.5 %; Apply 1 application topically 3 (three) times daily. -     methylPREDNISolone  acetate (DEPO-MEDROL) injection 120 mg   I am having Misti L. Troop start on levocetirizine and triamcinolone cream. I am also having her maintain her Align, cholecalciferol, calcium carbonate, acetaminophen, traMADol, ProAir HFA, hydrochlorothiazide, gabapentin, lansoprazole, Restasis, ALPRAZolam, triamcinolone, losartan, metoprolol succinate, amLODipine, and rosuvastatin. We administered methylPREDNISolone acetate.  Meds ordered this encounter  Medications  . levocetirizine (XYZAL) 5 MG tablet    Sig: Take 1 tablet (5 mg total) by mouth every evening.    Dispense:  30 tablet    Refill:  0  . triamcinolone cream (KENALOG) 0.5 %    Sig: Apply 1 application topically 3 (three) times daily.    Dispense:  30 g    Refill:  0  . methylPREDNISolone acetate (DEPO-MEDROL) injection 120 mg     Follow-up: Return if symptoms worsen or fail to improve.  Scarlette Calico, MD

## 2018-12-31 ENCOUNTER — Other Ambulatory Visit: Payer: Self-pay

## 2018-12-31 ENCOUNTER — Ambulatory Visit (INDEPENDENT_AMBULATORY_CARE_PROVIDER_SITE_OTHER): Payer: PPO

## 2018-12-31 DIAGNOSIS — Z23 Encounter for immunization: Secondary | ICD-10-CM | POA: Diagnosis not present

## 2019-01-17 ENCOUNTER — Encounter: Payer: Self-pay | Admitting: Internal Medicine

## 2019-01-17 ENCOUNTER — Ambulatory Visit (INDEPENDENT_AMBULATORY_CARE_PROVIDER_SITE_OTHER): Payer: PPO | Admitting: Internal Medicine

## 2019-01-17 ENCOUNTER — Other Ambulatory Visit: Payer: Self-pay | Admitting: Cardiovascular Disease

## 2019-01-17 DIAGNOSIS — F411 Generalized anxiety disorder: Secondary | ICD-10-CM | POA: Diagnosis not present

## 2019-01-17 DIAGNOSIS — R110 Nausea: Secondary | ICD-10-CM

## 2019-01-17 MED ORDER — LORAZEPAM 0.5 MG PO TABS: 0.5000 mg | ORAL_TABLET | Freq: Every evening | ORAL | 0 refills | Status: DC | PRN

## 2019-01-17 MED ORDER — ONDANSETRON HCL 4 MG PO TABS: 4.0000 mg | ORAL_TABLET | Freq: Two times a day (BID) | ORAL | 0 refills | Status: DC | PRN

## 2019-01-17 NOTE — Progress Notes (Signed)
Virtual Visit via Audio Note  I connected with Krystal [name] on 01/17/19 at  2:40 PM EDT by an audio-only enabled telemedicine application and verified that I am speaking with the correct person using two identifiers.  The patient and the provider were at separate locations throughout the entire encounter.   I discussed the limitations of evaluation and management by telemedicine and the availability of in person appointments. The patient expressed understanding and agreed to proceed. The patient and the provider were the only parties present for the visit unless noted in HPI below.  History of Present Illness: The patient is a 85 y.o. female with visit for stomach spasms. Started with some diarrhea about last week. Has nausea as well along with this. Having pain in the RLQ also where she has scar tissue. Now is slightly constipated. Denies blood in stool. Denies cold symptoms such as fevers or chills. Has chronic sinus problems but not worse and controlled by zyrtec. Still eating and drinking normally. Overall it is not improving. Has tried nothing for this. Is passing gas normally. Took a xanax last night and this made her feel bad so she has not taken that again. Previously had taken lorazepam and this helped more. She would like small supply to switch back. She is just wanting something for the nausea to help her eat well.   Observations/Objective: Voice strong, no cough or dyspnea during conversation, A and O times 3  Assessment and Plan: See problem oriented charting  Follow Up Instructions: Rx zofran and lorazepam, if no resolution may need further labs or testing.  Visit time 12 minutes: that time was spent in non-face to face counseling and coordination of care with the patient: counseled about as above  I discussed the assessment and treatment plan with the patient. The patient was provided an opportunity to ask questions and all were answered. The patient agreed with the plan and  demonstrated an understanding of the instructions.   The patient was advised to call back or seek an in-person evaluation if the symptoms worsen or if the condition fails to improve as anticipated.  [name], MD

## 2019-01-18 DIAGNOSIS — R110 Nausea: Secondary | ICD-10-CM | POA: Insufficient documentation

## 2019-01-18 NOTE — Assessment & Plan Note (Signed)
Will switch back to lorazepam and prior script was for daily prn. Can discuss with her PCP if he wishes to continue at follow up.

## 2019-01-18 NOTE — Assessment & Plan Note (Signed)
Rx zofran and if no resolution may need labs or further testing. May be related to sinus symptoms and asked her to start taking her allergy medication daily instead of sporadic as she is doing now.

## 2019-01-21 ENCOUNTER — Other Ambulatory Visit: Payer: Self-pay | Admitting: Internal Medicine

## 2019-01-21 DIAGNOSIS — Z91038 Other insect allergy status: Secondary | ICD-10-CM

## 2019-01-25 ENCOUNTER — Ambulatory Visit: Payer: PPO | Admitting: Nurse Practitioner

## 2019-02-10 ENCOUNTER — Ambulatory Visit: Payer: PPO | Admitting: Internal Medicine

## 2019-02-11 ENCOUNTER — Ambulatory Visit (INDEPENDENT_AMBULATORY_CARE_PROVIDER_SITE_OTHER): Payer: PPO | Admitting: Family

## 2019-02-11 ENCOUNTER — Other Ambulatory Visit: Payer: Self-pay

## 2019-02-11 ENCOUNTER — Encounter: Payer: Self-pay | Admitting: Family

## 2019-02-11 ENCOUNTER — Other Ambulatory Visit (INDEPENDENT_AMBULATORY_CARE_PROVIDER_SITE_OTHER): Payer: PPO

## 2019-02-11 VITALS — BP 138/68 | HR 69 | Temp 98.1°F | Ht 65.0 in | Wt 151.8 lb

## 2019-02-11 DIAGNOSIS — K582 Mixed irritable bowel syndrome: Secondary | ICD-10-CM | POA: Diagnosis not present

## 2019-02-11 LAB — CBC WITH DIFFERENTIAL/PLATELET
Basophils Absolute: 0 10*3/uL (ref 0.0–0.1)
Basophils Relative: 0.4 % (ref 0.0–3.0)
Eosinophils Absolute: 0.1 10*3/uL (ref 0.0–0.7)
Eosinophils Relative: 1.5 % (ref 0.0–5.0)
HCT: 43.7 % (ref 36.0–46.0)
Hemoglobin: 14.6 g/dL (ref 12.0–15.0)
Lymphocytes Relative: 27 % (ref 12.0–46.0)
Lymphs Abs: 2.6 10*3/uL (ref 0.7–4.0)
MCHC: 33.4 g/dL (ref 30.0–36.0)
MCV: 95 fl (ref 78.0–100.0)
Monocytes Absolute: 0.7 10*3/uL (ref 0.1–1.0)
Monocytes Relative: 6.9 % (ref 3.0–12.0)
Neutro Abs: 6.2 10*3/uL (ref 1.4–7.7)
Neutrophils Relative %: 64.2 % (ref 43.0–77.0)
Platelets: 228 10*3/uL (ref 150.0–400.0)
RBC: 4.6 Mil/uL (ref 3.87–5.11)
RDW: 14.5 % (ref 11.5–15.5)
WBC: 9.7 10*3/uL (ref 4.0–10.5)

## 2019-02-11 LAB — COMPREHENSIVE METABOLIC PANEL
ALT: 16 U/L (ref 0–35)
AST: 18 U/L (ref 0–37)
Albumin: 4.2 g/dL (ref 3.5–5.2)
Alkaline Phosphatase: 92 U/L (ref 39–117)
BUN: 20 mg/dL (ref 6–23)
CO2: 28 mEq/L (ref 19–32)
Calcium: 8.8 mg/dL (ref 8.4–10.5)
Chloride: 101 mEq/L (ref 96–112)
Creatinine, Ser: 1.23 mg/dL — ABNORMAL HIGH (ref 0.40–1.20)
GFR: 41.3 mL/min — ABNORMAL LOW (ref >60.00)
Glucose, Bld: 101 mg/dL — ABNORMAL HIGH (ref 70–99)
Potassium: 3.7 mEq/L (ref 3.5–5.1)
Sodium: 139 mEq/L (ref 135–145)
Total Bilirubin: 0.4 mg/dL (ref 0.2–1.2)
Total Protein: 6.8 g/dL (ref 6.0–8.3)

## 2019-02-11 MED ORDER — LORAZEPAM 0.5 MG PO TABS: 0.5000 mg | ORAL_TABLET | Freq: Every evening | ORAL | 0 refills | Status: DC | PRN

## 2019-02-11 NOTE — Progress Notes (Signed)
Krystal [name] is a 85 y.o. female with the following history as recorded in EpicCare:  Patient Active Problem List   Diagnosis Date Noted  . Nausea 01/18/2019  . Allergic reaction to insect bite 12/29/2018  . Chronic renal disease, stage 3, moderately decreased glomerular filtration rate (GFR) between 30-59 mL/min/1.73 square meter 11/08/2018  . Chronic idiopathic constipation 10/19/2017  . LBBB (left bundle branch block) 07/16/2016  . Cardiomyopathy ([place]) 07/16/2016  . Benign paroxysmal positional vertigo 01/17/2016  . Nocturnal foot cramps 01/17/2016  . Hyperglycemia 05/23/2015  . IBS (irritable bowel syndrome) 10/13/2011  . GERD (gastroesophageal reflux disease) 06/25/2010  . RENAL CYST 11/18/2007  . OSTEOPOROSIS 11/18/2007  . Allergic rhinitis 10/06/2007  . Hyperlipidemia with target LDL less than 70 07/19/2007  . GAD (generalized anxiety disorder) 07/19/2007  . Essential hypertension 07/19/2007  . CAD S/P percutaneous coronary angioplasty 07/19/2007  . VENOUS INSUFFICIENCY 07/19/2007  . Osteoarthritis 07/19/2007  . LOW BACK PAIN SYNDROME 07/19/2007    Current Outpatient Medications  Medication Sig Dispense Refill  . acetaminophen (TYLENOL) 500 MG tablet Take 500 mg by mouth every 4 (four) hours as needed for mild pain or headache.    Marland Kitchen amLODipine (NORVASC) 10 MG tablet Take 1 tablet (10 mg total) by mouth daily. 90 tablet 1  . calcium carbonate (OS-CAL) 600 MG TABS tablet Take 600 mg by mouth 2 (two) times daily with a meal.    . cholecalciferol (VITAMIN D) 1000 UNITS tablet Take 2,000 Units by mouth daily.     . hydrochlorothiazide (HYDRODIURIL) 25 MG tablet TAKE 1/2 TABLET BY MOUTH EVERY DAY 45 tablet 2  . lansoprazole (PREVACID) 15 MG capsule TAKE 1 CAPSULE BY MOUTH TWICE A DAY BEFORE MEALS 180 capsule 3  . levocetirizine (XYZAL) 5 MG tablet Take 1 tablet (5 mg total) by mouth every evening. 90 tablet 1  . LORazepam (ATIVAN) 0.5 MG tablet Take 1 tablet (0.5 mg total) by  mouth at bedtime as needed for anxiety. 30 tablet 0  . losartan (COZAAR) 50 MG tablet Take 1 tablet (50 mg total) by mouth daily. 90 tablet 1  . metoprolol succinate (TOPROL-XL) 25 MG 24 hr tablet Take 1 tablet (25 mg total) by mouth daily. 90 tablet 1  . ondansetron (ZOFRAN) 4 MG tablet Take 1 tablet (4 mg total) by mouth 2 (two) times daily as needed for nausea or vomiting. 20 tablet 0  . PROAIR HFA 108 (90 Base) MCG/ACT inhaler TAKE 2 PUFFS BY MOUTH EVERY 6 HOURS AS NEEDED FOR WHEEZE OR SHORTNESS OF BREATH 8.5 Inhaler 2  . Probiotic Product (ALIGN) 4 MG CAPS Take 4 mg by mouth as needed.     . RESTASIS 0.05 % ophthalmic emulsion     . rosuvastatin (CRESTOR) 10 MG tablet TAKE 1 TABLET BY MOUTH EVERY DAY 90 tablet 2  . traMADol (ULTRAM) 50 MG tablet Take 1 tablet (50 mg total) by mouth at bedtime as needed. 90 tablet 1  . triamcinolone (NASACORT) 55 MCG/ACT AERO nasal inhaler Place 2 sprays into the nose daily. 1 Inhaler 12  . triamcinolone cream (KENALOG) 0.5 % Apply 1 application topically 3 (three) times daily. 30 g 0  . gabapentin (NEURONTIN) 100 MG capsule TAKE 1 CAPSULE BY MOUTH EVERY DAY AT BEDTIME AS NEEDED (Patient not taking: Reported on 01/17/2019) 90 capsule 1   No current facility-administered medications for this visit.     Allergies: Atorvastatin, Simvastatin, Alendronate sodium, Cefdinir, Esomeprazole magnesium, Levofloxacin, Omeprazole, Other, Penicillins, and Shellfish-derived products  Past Medical History:  Diagnosis Date  . Adenomatous colon polyp   . Allergy    seasonal  . Anemia    pt. denies  . Anxiety   . CAD (coronary artery disease)   . Cataract    cataracts removed left eye.  . Chronic lower back pain   . Diverticulosis   . GERD (gastroesophageal reflux disease)   . Headache(784.0)   . Hiatal hernia   . Hyperlipidemia   . Hypertension   . IBS (irritable bowel syndrome) 10/13/2011  . Osteoarthritis   . Osteoporosis   . Renal cysts, acquired, bilateral    . SBO (small bowel obstruction) ([place]) 09/05/2015  . Venous insufficiency     Past Surgical History:  Procedure Laterality Date  . ABDOMINAL HYSTERECTOMY    . BLADDER REPAIR    . CARDIAC CATHETERIZATION  01/12/2002   90% stenosis of the left circumflex, stented with a 3x57m Cypher stent, resulting in reduction of 90% to 10%   . CARDIOVASCULAR STRESS TEST  03/22/2007   Mild inferolateral thinning toward the apex without significant ischemia. Nondiagnostic electrocardiogram.  . CATARACT EXTRACTION     bilateral  . COLONOSCOPY  08/17/2008   adenomatous polyp, diverticulosis, external hemorrhoids  . COLONOSCOPY W/ BIOPSIES     multiple   . LEFT LOWER EXTREMITY VENOUS DOPPLER  06/18/2011   No evidence of left lower extremity DVT  . TRANSTHORACIC ECHOCARDIOGRAM  11/18/2012   EF 421-11% LV systolic mild-moderately reduced, mild-moderate mitral valve regurg, mild-moderate tricuspid valve regurg. NSR-LBBB with occas PVCs  . UPPER GASTROINTESTINAL ENDOSCOPY  07/03/2010   hiatal hernia  . VARICOSE VEIN SURGERY      Family History  Problem Relation Age of Onset  . Heart disease Father   . Stroke Sister   . Colon cancer Neg Hx     Social History   Tobacco Use  . Smoking status: Never Smoker  . Smokeless tobacco: Never Used  Substance Use Topics  . Alcohol use: No    Alcohol/week: 0.0 standard drinks    Subjective:  Patient has chronic stomach issues- notes that her stomach "feels nervous" x 3-4 weeks; does have documented IBS with both constipation and diarrhea and last saw her GI in 2019; uses combination of Lorazepam and Dicyclomine to help with her stomach spasms/ "anxious stomach". Notes that she was unable to get the Lorazepam that was written in October;  Is not having diarrhea or constipation at this time- feels like her stools have been "more loose recently"/ no blood in stool; no vomiting; has lost 5 pounds since OV in September 2020- feels like she has been afraid to eat because  "she was worried about her stomach";      Objective:  Vitals:   02/11/19 1319  BP: 138/68  Pulse: 69  Temp: 98.1 F (36.7 C)  TempSrc: Oral  SpO2: 96%  Weight: 151 lb 12.8 oz (68.9 kg)  Height: 5' 5"  (1.651 m)    General: Well developed, well nourished, in no acute distress  Skin : Warm and dry.  Head: Normocephalic and atraumatic  Lungs: Respirations unlabored; clear to auscultation bilaterally without wheeze, rales, rhonchi  CVS exam: normal rate and regular rhythm.  Abdomen: Soft; nontender; nondistended; normoactive bowel sounds; no masses or hepatosplenomegaly  Neurologic: Alert and oriented; speech intact; face symmetrical; moves all extremities well; CNII-XII intact without focal deficit  Assessment:  1. Irritable bowel syndrome with both constipation and diarrhea     Plan:  Check CBC, CMP  today; refill on Ativan as requested; continue Dicyclomine as needed; keep planned follow-up with GI. Follow-up to be determined.   No follow-ups on file.  Orders Placed This Encounter  Procedures  . CBC w/Diff    Standing Status:   Future    Standing Expiration Date:   02/11/2020  . Comp Met (CMET)    Standing Status:   Future    Standing Expiration Date:   02/11/2020    Requested Prescriptions   Signed Prescriptions Disp Refills  . LORazepam (ATIVAN) 0.5 MG tablet 30 tablet 0    Sig: Take 1 tablet (0.5 mg total) by mouth at bedtime as needed for anxiety.

## 2019-02-22 ENCOUNTER — Ambulatory Visit: Payer: PPO | Admitting: Internal Medicine

## 2019-02-22 ENCOUNTER — Encounter: Payer: Self-pay | Admitting: Internal Medicine

## 2019-02-22 DIAGNOSIS — K580 Irritable bowel syndrome with diarrhea: Secondary | ICD-10-CM | POA: Diagnosis not present

## 2019-02-22 MED ORDER — METRONIDAZOLE 250 MG PO TABS: 250.0000 mg | ORAL_TABLET | Freq: Three times a day (TID) | ORAL | 0 refills | Status: AC

## 2019-02-22 NOTE — Assessment & Plan Note (Signed)
Metronidazole prn flare w/ suspected SIBO

## 2019-02-22 NOTE — Progress Notes (Signed)
Krystal [name] 85 y.o. [birth year] [id number]  Assessment & Plan:   IBS (irritable bowel syndrome) Metronidazole prn flare w/ suspected SIBO     Subjective:   Chief Complaint:  HPI Krystal [name] is here with her husband [name], she had set up an appointment after she had had some nausea and vomiting and diarrhea with mucoid stools.  She calls this her diverticulitis though I had thought she probably has had small intestinal bacterial overgrowth.  What ever this is a tends to respond to metronidazole and she had some at home and took it and feels better other than some bloating.  She would like to keep some metronidazole on hand for recurrent attacks.   Allergies  Allergen Reactions  . Atorvastatin Other (See Comments)    Muscle aches  . Simvastatin Other (See Comments)    Myalgias   . Alendronate Sodium     REACTION: pt states INTOL to Fosamax w/ esophagitis  . Cefdinir     REACTION: diarrhea  . Esomeprazole Magnesium     REACTION: pt states INTOL to Nexium  . Levofloxacin     REACTION: diarrhea  . Omeprazole     REACTION: pt states INTOL to Prilosec  . Other     Pneumonia vaccine---felt tired and achy and sick x 6 months to get over this.  . Penicillins Hives  . Shellfish-Derived Products Hives, Nausea Only and Rash   Current Meds  Medication Sig  . acetaminophen (TYLENOL) 500 MG tablet Take 500 mg by mouth every 4 (four) hours as needed for mild pain or headache.  Marland Kitchen amLODipine (NORVASC) 10 MG tablet Take 1 tablet (10 mg total) by mouth daily.  . calcium carbonate (OS-CAL) 600 MG TABS tablet Take 600 mg by mouth 2 (two) times daily with a meal.  . cholecalciferol (VITAMIN D) 1000 UNITS tablet Take 2,000 Units by mouth daily.   Marland Kitchen gabapentin (NEURONTIN) 100 MG capsule TAKE 1 CAPSULE BY MOUTH EVERY DAY AT BEDTIME AS NEEDED  . hydrochlorothiazide (HYDRODIURIL) 25 MG tablet TAKE 1/2 TABLET BY MOUTH EVERY DAY  . lansoprazole (PREVACID) 15 MG capsule TAKE 1 CAPSULE BY MOUTH TWICE  A DAY BEFORE MEALS  . levocetirizine (XYZAL) 5 MG tablet Take 1 tablet (5 mg total) by mouth every evening.  Marland Kitchen LORazepam (ATIVAN) 0.5 MG tablet Take 1 tablet (0.5 mg total) by mouth at bedtime as needed for anxiety.  Marland Kitchen losartan (COZAAR) 50 MG tablet Take 1 tablet (50 mg total) by mouth daily.  . metoprolol succinate (TOPROL-XL) 25 MG 24 hr tablet Take 1 tablet (25 mg total) by mouth daily.  . ondansetron (ZOFRAN) 4 MG tablet Take 1 tablet (4 mg total) by mouth 2 (two) times daily as needed for nausea or vomiting.  Marland Kitchen PROAIR HFA 108 (90 Base) MCG/ACT inhaler TAKE 2 PUFFS BY MOUTH EVERY 6 HOURS AS NEEDED FOR WHEEZE OR SHORTNESS OF BREATH  . Probiotic Product (ALIGN) 4 MG CAPS Take 4 mg by mouth as needed.   . RESTASIS 0.05 % ophthalmic emulsion   . rosuvastatin (CRESTOR) 10 MG tablet TAKE 1 TABLET BY MOUTH EVERY DAY  . traMADol (ULTRAM) 50 MG tablet Take 1 tablet (50 mg total) by mouth at bedtime as needed.  . triamcinolone (NASACORT) 55 MCG/ACT AERO nasal inhaler Place 2 sprays into the nose daily.  Marland Kitchen triamcinolone cream (KENALOG) 0.5 % Apply 1 application topically 3 (three) times daily.   Past Medical History:  Diagnosis Date  . Adenomatous colon polyp   .  Allergy    seasonal  . Anemia    pt. denies  . Anxiety   . CAD (coronary artery disease)   . Cataract    cataracts removed left eye.  . Chronic lower back pain   . Diverticulosis   . GERD (gastroesophageal reflux disease)   . Headache(784.0)   . Hiatal hernia   . Hyperlipidemia   . Hypertension   . IBS (irritable bowel syndrome) 10/13/2011  . Osteoarthritis   . Osteoporosis   . Renal cysts, acquired, bilateral   . SBO (small bowel obstruction) ([place]) 09/05/2015  . Venous insufficiency    Past Surgical History:  Procedure Laterality Date  . ABDOMINAL HYSTERECTOMY    . BLADDER REPAIR    . CARDIAC CATHETERIZATION  01/12/2002   90% stenosis of the left circumflex, stented with a 3x35mm Cypher stent, resulting in reduction of 90%  to 10%   . CARDIOVASCULAR STRESS TEST  03/22/2007   Mild inferolateral thinning toward the apex without significant ischemia. Nondiagnostic electrocardiogram.  . CATARACT EXTRACTION     bilateral  . COLONOSCOPY  08/17/2008   adenomatous polyp, diverticulosis, external hemorrhoids  . COLONOSCOPY W/ BIOPSIES     multiple   . LEFT LOWER EXTREMITY VENOUS DOPPLER  06/18/2011   No evidence of left lower extremity DVT  . TRANSTHORACIC ECHOCARDIOGRAM  11/18/2012   EF [id number], LV systolic mild-moderately reduced, mild-moderate mitral valve regurg, mild-moderate tricuspid valve regurg. NSR-LBBB with occas PVCs  . UPPER GASTROINTESTINAL ENDOSCOPY  07/03/2010   hiatal hernia  . VARICOSE VEIN SURGERY     Social History   Social History Narrative   Married to [employer] who has congestive heart failure problems   She is retired never smoker rare alcohol   family history includes Heart disease in her father; Stroke in her sister.   Review of Systems As per HPI  Objective:   Physical Exam BP 118/68   Pulse 78   Temp (!) 97 F (36.1 C)   Ht 5\' 5"  (1.651 m)   Wt 154 lb (69.9 kg)   BMI 25.63 kg/m  Alert and oriented x3 well-developed well-nourished white woman no acute distress Eyes anicteric Benign abd soft and nontender without organomegaly or mass  15 minutes time spent with patient > half in counseling coordination of care

## 2019-02-22 NOTE — Patient Instructions (Signed)
We have sent the following medications to your pharmacy for you to pick up at your convenience: Metronidazole, this is to have on hand if you get a flare.  If you have to use it please call us.   Follow up with Dr. Carlean Purl as needed.    I appreciate the opportunity to care for you. Silvano Rusk, MD, [facility]

## 2019-04-10 ENCOUNTER — Other Ambulatory Visit: Payer: Self-pay

## 2019-04-10 ENCOUNTER — Ambulatory Visit: Payer: Medicare Other | Attending: Internal Medicine

## 2019-04-10 DIAGNOSIS — Z23 Encounter for immunization: Secondary | ICD-10-CM | POA: Insufficient documentation

## 2019-04-10 NOTE — Progress Notes (Signed)
   Covid-19 Vaccination Clinic  Name:  Krystal [name]    MRN: [id number] DOB: September 16, 1929  04/10/2019  [name] was observed post Covid-19 immunization for 15 minutes without incidence. She was provided with Vaccine Information Sheet and instruction to access the V-Safe system.   [name] was instructed to call 911 with any severe reactions post vaccine: Marland Kitchen Difficulty breathing  . Swelling of your face and throat  . A fast heartbeat  . A bad rash all over your body  . Dizziness and weakness    Immunizations Administered    Name Date Dose VIS Date Route   Pfizer COVID-19 Vaccine 04/10/2019 10:59 AM 0.3 mL 03/11/2019 Intramuscular   Manufacturer: [employer]   Lot: [id number]   [place]: [id number]

## 2019-04-11 ENCOUNTER — Other Ambulatory Visit: Payer: Self-pay | Admitting: Internal Medicine

## 2019-04-11 DIAGNOSIS — I2510 Atherosclerotic heart disease of native coronary artery without angina pectoris: Secondary | ICD-10-CM

## 2019-04-11 DIAGNOSIS — Z9861 Coronary angioplasty status: Secondary | ICD-10-CM

## 2019-04-11 DIAGNOSIS — I10 Essential (primary) hypertension: Secondary | ICD-10-CM

## 2019-05-01 ENCOUNTER — Ambulatory Visit: Payer: PPO | Attending: Internal Medicine

## 2019-05-01 DIAGNOSIS — Z23 Encounter for immunization: Secondary | ICD-10-CM | POA: Insufficient documentation

## 2019-05-01 NOTE — Progress Notes (Signed)
   Covid-19 Vaccination Clinic  Name:  Krystal [name]    MRN: [id number] DOB: 1929-12-04  05/01/2019  Krystal [name] was observed post Covid-19 immunization for 15 minutes without incidence. She was provided with Vaccine Information Sheet and instruction to access the V-Safe system.   Krystal [name] was instructed to call 911 with any severe reactions post vaccine: Marland Kitchen Difficulty breathing  . Swelling of your face and throat  . A fast heartbeat  . A bad rash all over your body  . Dizziness and weakness    Immunizations Administered    Name Date Dose VIS Date Route   Pfizer COVID-19 Vaccine 05/01/2019  9:32 AM 0.3 mL 03/11/2019 Intramuscular   Manufacturer: [place]   Lot: [id number]   [place]: [id number]

## 2019-05-05 ENCOUNTER — Telehealth: Payer: Self-pay | Admitting: Internal Medicine

## 2019-05-05 NOTE — Telephone Encounter (Signed)
I spoke with Krystal [name] and she reports on and off symptoms for the past several weeks: weak, nausea, no fever, no diarrhea, bloated, lots of gas, RLQ pain. She has an appointment to see Dr Carlean Purl 05/30/2019. She has metronidazole 250 mg TID on hand that we gave her. She wants to know if she should start it. Please advise Sir, thank you.

## 2019-05-05 NOTE — Telephone Encounter (Signed)
Patient informed and will start the medicine.

## 2019-05-05 NOTE — Telephone Encounter (Signed)
Yes Take the metroidazole

## 2019-05-06 ENCOUNTER — Ambulatory Visit: Payer: PPO | Admitting: Orthopedic Surgery

## 2019-05-06 ENCOUNTER — Telehealth: Payer: Self-pay | Admitting: Internal Medicine

## 2019-05-06 MED ORDER — ONDANSETRON HCL 4 MG PO TABS: 4.0000 mg | ORAL_TABLET | Freq: Two times a day (BID) | ORAL | 0 refills | Status: DC | PRN

## 2019-05-06 NOTE — Telephone Encounter (Signed)
Patient called back and has been informed of the RX being called in.

## 2019-05-06 NOTE — Telephone Encounter (Signed)
   Patient states she is having episodes of nausea. Requesting refill for ondansetron (ZOFRAN) 4 MG tablet Pharmacy [id number] - [place], [place]

## 2019-05-06 NOTE — Telephone Encounter (Signed)
Pt received her 2nd COVID vaccine and has been experiencing some nausea. Pt is requesting an rx for ondansetron (which she has tolerated well in the past).   Can you advise in PCP absence?

## 2019-05-06 NOTE — Telephone Encounter (Signed)
That's fine; will send in Rx for her;  It appears to be very normal that the 2nd COVID vaccine is causing people to feel much worse than the 1st one.

## 2019-05-06 NOTE — Telephone Encounter (Signed)
Patient called back and provider has called in a RX. See other TE.

## 2019-05-06 NOTE — Telephone Encounter (Signed)
[employer] message states : Caller states c/o weakness and nauseated unless she is sitting down. She had her 2nd Covid vaccine Sunday and is having some soreness at the injection site.   States to see PCP within 24 hours, LVM for patient to call back and set up an appointment.

## 2019-05-17 ENCOUNTER — Ambulatory Visit (INDEPENDENT_AMBULATORY_CARE_PROVIDER_SITE_OTHER): Payer: PPO | Admitting: Family

## 2019-05-17 DIAGNOSIS — F411 Generalized anxiety disorder: Secondary | ICD-10-CM | POA: Diagnosis not present

## 2019-05-17 DIAGNOSIS — J019 Acute sinusitis, unspecified: Secondary | ICD-10-CM | POA: Diagnosis not present

## 2019-05-17 MED ORDER — LORAZEPAM 0.5 MG PO TABS: 0.5000 mg | ORAL_TABLET | Freq: Every evening | ORAL | 0 refills | Status: DC | PRN

## 2019-05-17 MED ORDER — PREDNISONE 20 MG PO TABS: 20.0000 mg | ORAL_TABLET | Freq: Every day | ORAL | 0 refills | Status: DC

## 2019-05-17 MED ORDER — DOXYCYCLINE HYCLATE 100 MG PO TABS: 100.0000 mg | ORAL_TABLET | Freq: Two times a day (BID) | ORAL | 0 refills | Status: DC

## 2019-05-17 NOTE — Progress Notes (Signed)
Krystal [name] is a 86 y.o. female with the following history as recorded in EpicCare:  Patient Active Problem List   Diagnosis Date Noted  . Nausea 01/18/2019  . Allergic reaction to insect bite 12/29/2018  . Chronic renal disease, stage 3, moderately decreased glomerular filtration rate (GFR) between 30-59 mL/min/1.73 square meter 11/08/2018  . Chronic idiopathic constipation 10/19/2017  . LBBB (left bundle branch block) 07/16/2016  . Cardiomyopathy ([place]) 07/16/2016  . Benign paroxysmal positional vertigo 01/17/2016  . Nocturnal foot cramps 01/17/2016  . Hyperglycemia 05/23/2015  . IBS (irritable bowel syndrome) 10/13/2011  . GERD (gastroesophageal reflux disease) 06/25/2010  . RENAL CYST 11/18/2007  . OSTEOPOROSIS 11/18/2007  . Allergic rhinitis 10/06/2007  . Hyperlipidemia with target LDL less than 70 07/19/2007  . GAD (generalized anxiety disorder) 07/19/2007  . Essential hypertension 07/19/2007  . CAD S/P percutaneous coronary angioplasty 07/19/2007  . VENOUS INSUFFICIENCY 07/19/2007  . Osteoarthritis 07/19/2007  . LOW BACK PAIN SYNDROME 07/19/2007    Current Outpatient Medications  Medication Sig Dispense Refill  . acetaminophen (TYLENOL) 500 MG tablet Take 500 mg by mouth every 4 (four) hours as needed for mild pain or headache.    Marland Kitchen amLODipine (NORVASC) 10 MG tablet Take 1 tablet (10 mg total) by mouth daily. 90 tablet 1  . calcium carbonate (OS-CAL) 600 MG TABS tablet Take 600 mg by mouth 2 (two) times daily with a meal.    . cholecalciferol (VITAMIN D) 1000 UNITS tablet Take 2,000 Units by mouth daily.     Marland Kitchen gabapentin (NEURONTIN) 100 MG capsule TAKE 1 CAPSULE BY MOUTH EVERY DAY AT BEDTIME AS NEEDED 90 capsule 1  . hydrochlorothiazide (HYDRODIURIL) 25 MG tablet TAKE 1/2 TABLET BY MOUTH EVERY DAY 45 tablet 2  . lansoprazole (PREVACID) 15 MG capsule TAKE 1 CAPSULE BY MOUTH TWICE A DAY BEFORE MEALS 180 capsule 3  . levocetirizine (XYZAL) 5 MG tablet Take 1 tablet (5 mg  total) by mouth every evening. 90 tablet 1  . LORazepam (ATIVAN) 0.5 MG tablet Take 1 tablet (0.5 mg total) by mouth at bedtime as needed for anxiety. 30 tablet 0  . losartan (COZAAR) 50 MG tablet TAKE 1 TABLET BY MOUTH EVERY DAY 90 tablet 1  . metoprolol succinate (TOPROL-XL) 25 MG 24 hr tablet TAKE 1 TABLET BY MOUTH EVERY DAY 90 tablet 1  . ondansetron (ZOFRAN) 4 MG tablet Take 1 tablet (4 mg total) by mouth 2 (two) times daily as needed for nausea or vomiting. 20 tablet 0  . PROAIR HFA 108 (90 Base) MCG/ACT inhaler TAKE 2 PUFFS BY MOUTH EVERY 6 HOURS AS NEEDED FOR WHEEZE OR SHORTNESS OF BREATH 8.5 Inhaler 2  . Probiotic Product (ALIGN) 4 MG CAPS Take 4 mg by mouth as needed.     . RESTASIS 0.05 % ophthalmic emulsion     . rosuvastatin (CRESTOR) 10 MG tablet TAKE 1 TABLET BY MOUTH EVERY DAY 90 tablet 2  . traMADol (ULTRAM) 50 MG tablet Take 1 tablet (50 mg total) by mouth at bedtime as needed. 90 tablet 1  . triamcinolone (NASACORT) 55 MCG/ACT AERO nasal inhaler Place 2 sprays into the nose daily. 1 Inhaler 12  . triamcinolone cream (KENALOG) 0.5 % Apply 1 application topically 3 (three) times daily. 30 g 0   No current facility-administered medications for this visit.    Allergies: Atorvastatin, Simvastatin, Alendronate sodium, Cefdinir, Esomeprazole magnesium, Levofloxacin, Omeprazole, Other, Penicillins, and Shellfish-derived products  Past Medical History:  Diagnosis Date  . Adenomatous colon  polyp   . Allergy    seasonal  . Anemia    pt. denies  . Anxiety   . CAD (coronary artery disease)   . Cataract    cataracts removed left eye.  . Chronic lower back pain   . Diverticulosis   . GERD (gastroesophageal reflux disease)   . Headache(784.0)   . Hiatal hernia   . Hyperlipidemia   . Hypertension   . IBS (irritable bowel syndrome) 10/13/2011  . Osteoarthritis   . Osteoporosis   . Renal cysts, acquired, bilateral   . SBO (small bowel obstruction) ([place]) 09/05/2015  . Venous  insufficiency     Past Surgical History:  Procedure Laterality Date  . ABDOMINAL HYSTERECTOMY    . BLADDER REPAIR    . CARDIAC CATHETERIZATION  01/12/2002   90% stenosis of the left circumflex, stented with a 3x36mm Cypher stent, resulting in reduction of 90% to 10%   . CARDIOVASCULAR STRESS TEST  03/22/2007   Mild inferolateral thinning toward the apex without significant ischemia. Nondiagnostic electrocardiogram.  . CATARACT EXTRACTION     bilateral  . COLONOSCOPY  08/17/2008   adenomatous polyp, diverticulosis, external hemorrhoids  . COLONOSCOPY W/ BIOPSIES     multiple   . LEFT LOWER EXTREMITY VENOUS DOPPLER  06/18/2011   No evidence of left lower extremity DVT  . TRANSTHORACIC ECHOCARDIOGRAM  11/18/2012   EF [id number], LV systolic mild-moderately reduced, mild-moderate mitral valve regurg, mild-moderate tricuspid valve regurg. NSR-LBBB with occas PVCs  . UPPER GASTROINTESTINAL ENDOSCOPY  07/03/2010   hiatal hernia  . VARICOSE VEIN SURGERY      Family History  Problem Relation Age of Onset  . Heart disease Father   . Stroke Sister   . Colon cancer Neg Hx     Social History   Tobacco Use  . Smoking status: Never Smoker  . Smokeless tobacco: Never Used  Substance Use Topics  . Alcohol use: No    Alcohol/week: 0.0 standard drinks    Subjective:   I connected with Krystal [name] on 05/17/19 at  9:20 AM EST by a telephone call and verified that I am speaking with the correct person using two identifiers.   I discussed the limitations of evaluation and management by telemedicine and the availability of in person appointments. The patient expressed understanding and agreed to proceed. Provider in office/ patient is at home; provider and patient are only 2 people on telephone call.   Patient is calling with concerns for sinus infection; notes she has had sinus pain/ pressure x 3-4 weeks; + frontal headache; + sinus congestion- "thick." Is taking Zyrtec daily; is requesting Prednisone  to help with her symptoms along with antibiotic;  Also requesting refill on her Lorazepam which she uses occasionally for anxiety; PMDP is reviewed and last refill was in November 2020;    Objective:  There were no vitals filed for this visit.  Lungs: Respirations unlabored;  Neurologic: Alert and oriented; speech intact; face symmetrical;   Assessment:  1. Acute sinusitis, recurrence not specified, unspecified location   2. GAD (generalized anxiety disorder)     Plan:  1. Rx for Doxycycline 100 mg bid x 10 days and Prednisone 20 mg qd x 5 days; continue Zyrtec; increase fluids, rest and follow-up worse, no better. 2. Rx for Ativan 0.5 1 po qd prn;   Time spent 15 minutes on phone call;   No follow-ups on file.  No orders of the defined types were placed in this encounter.  Requested Prescriptions    No prescriptions requested or ordered in this encounter

## 2019-05-25 ENCOUNTER — Ambulatory Visit (INDEPENDENT_AMBULATORY_CARE_PROVIDER_SITE_OTHER): Payer: PPO | Admitting: Family

## 2019-05-25 ENCOUNTER — Other Ambulatory Visit: Payer: Self-pay

## 2019-05-25 ENCOUNTER — Encounter: Payer: Self-pay | Admitting: Family

## 2019-05-25 VITALS — BP 118/68 | HR 61 | Temp 98.2°F | Ht 65.0 in | Wt 153.6 lb

## 2019-05-25 DIAGNOSIS — K580 Irritable bowel syndrome with diarrhea: Secondary | ICD-10-CM | POA: Diagnosis not present

## 2019-05-25 MED ORDER — DICYCLOMINE HCL 10 MG PO CAPS: 10.0000 mg | ORAL_CAPSULE | Freq: Three times a day (TID) | ORAL | 0 refills | Status: DC

## 2019-05-25 NOTE — Progress Notes (Signed)
Krystal [name] is a 86 y.o. female with the following history as recorded in Epic[employer]:  Patient Active Problem List   Diagnosis Date Noted  . Nausea 01/18/2019  . Allergic reaction to insect bite 12/29/2018  . Chronic renal disease, stage 3, moderately decreased glomerular filtration rate (GFR) between 30-59 mL/min/1.73 square meter 11/08/2018  . Chronic idiopathic constipation 10/19/2017  . LBBB (left bundle branch block) 07/16/2016  . Cardiomyopathy ([place]) 07/16/2016  . Benign paroxysmal positional vertigo 01/17/2016  . Nocturnal foot cramps 01/17/2016  . Hyperglycemia 05/23/2015  . IBS (irritable bowel syndrome) 10/13/2011  . GERD (gastroesophageal reflux disease) 06/25/2010  . RENAL CYST 11/18/2007  . OSTEOPOROSIS 11/18/2007  . Allergic rhinitis 10/06/2007  . Hyperlipidemia with target LDL less than 70 07/19/2007  . GAD (generalized anxiety disorder) 07/19/2007  . Essential hypertension 07/19/2007  . CAD S/P percutaneous coronary angioplasty 07/19/2007  . VENOUS INSUFFICIENCY 07/19/2007  . Osteoarthritis 07/19/2007  . LOW BACK PAIN SYNDROME 07/19/2007    Current Outpatient Medications  Medication Sig Dispense Refill  . acetaminophen (TYLENOL) 500 MG tablet Take 500 mg by mouth every 4 (four) hours as needed for mild pain or headache.    Marland Kitchen amLODipine (NORVASC) 10 MG tablet Take 1 tablet (10 mg total) by mouth daily. 90 tablet 1  . calcium carbonate (OS-CAL) 600 MG TABS tablet Take 600 mg by mouth 2 (two) times daily with a meal.    . cholecalciferol (VITAMIN D) 1000 UNITS tablet Take 2,000 Units by mouth daily.     . fluticasone (FLONASE) 50 MCG/ACT nasal spray SPRAY 2 SPRAYS INTO EACH NOSTRIL EVERY DAY    . gabapentin (NEURONTIN) 100 MG capsule TAKE 1 CAPSULE BY MOUTH EVERY DAY AT BEDTIME AS NEEDED 90 capsule 1  . hydrochlorothiazide (HYDRODIURIL) 25 MG tablet TAKE 1/2 TABLET BY MOUTH EVERY DAY 45 tablet 2  . lansoprazole (PREVACID) 15 MG capsule TAKE 1 CAPSULE BY MOUTH TWICE A  DAY BEFORE MEALS 180 capsule 3  . levocetirizine (XYZAL) 5 MG tablet Take 1 tablet (5 mg total) by mouth every evening. 90 tablet 1  . LORazepam (ATIVAN) 0.5 MG tablet Take 1 tablet (0.5 mg total) by mouth at bedtime as needed for anxiety. 30 tablet 0  . losartan (COZAAR) 50 MG tablet TAKE 1 TABLET BY MOUTH EVERY DAY 90 tablet 1  . metoprolol succinate (TOPROL-XL) 25 MG 24 hr tablet TAKE 1 TABLET BY MOUTH EVERY DAY 90 tablet 1  . ondansetron (ZOFRAN) 4 MG tablet Take 1 tablet (4 mg total) by mouth 2 (two) times daily as needed for nausea or vomiting. 20 tablet 0  . PREMARIN vaginal cream APPLY 1 APPLICATORFUL 2 3 TIMES A WEEK AS DIRECTED    . PROAIR HFA 108 (90 Base) MCG/ACT inhaler TAKE 2 PUFFS BY MOUTH EVERY 6 HOURS AS NEEDED FOR WHEEZE OR SHORTNESS OF BREATH 8.5 Inhaler 2  . Probiotic Product (ALIGN) 4 MG CAPS Take 4 mg by mouth as needed.     . RESTASIS 0.05 % ophthalmic emulsion     . rosuvastatin (CRESTOR) 10 MG tablet TAKE 1 TABLET BY MOUTH EVERY DAY 90 tablet 2  . SHINGRIX injection     . traMADol (ULTRAM) 50 MG tablet Take 1 tablet (50 mg total) by mouth at bedtime as needed. 90 tablet 1  . triamcinolone (NASACORT) 55 MCG/ACT AERO nasal inhaler Place 2 sprays into the nose daily. 1 Inhaler 12  . triamcinolone cream (KENALOG) 0.5 % Apply 1 application topically 3 (three) times  daily. 30 g 0  . dicyclomine (BENTYL) 10 MG capsule Take 1 capsule (10 mg total) by mouth 4 (four) times daily -  before meals and at bedtime. As needed for cramps 60 capsule 0   No current facility-administered medications for this visit.    Allergies: Atorvastatin, Simvastatin, Alendronate sodium, Cefdinir, Doxycycline, Esomeprazole magnesium, Levofloxacin, Omeprazole, Other, Penicillins, and Shellfish-derived products  Past Medical History:  Diagnosis Date  . Adenomatous colon polyp   . Allergy    seasonal  . Anemia    pt. denies  . Anxiety   . CAD (coronary artery disease)   . Cataract    cataracts  removed left eye.  . Chronic lower back pain   . Diverticulosis   . GERD (gastroesophageal reflux disease)   . Headache(784.0)   . Hiatal hernia   . Hyperlipidemia   . Hypertension   . IBS (irritable bowel syndrome) 10/13/2011  . Osteoarthritis   . Osteoporosis   . Renal cysts, acquired, bilateral   . SBO (small bowel obstruction) ([place]) 09/05/2015  . Venous insufficiency     Past Surgical History:  Procedure Laterality Date  . ABDOMINAL HYSTERECTOMY    . BLADDER REPAIR    . CARDIAC CATHETERIZATION  01/12/2002   90% stenosis of the left circumflex, stented with a 3x66mm Cypher stent, resulting in reduction of 90% to 10%   . CARDIOVASCULAR STRESS TEST  03/22/2007   Mild inferolateral thinning toward the apex without significant ischemia. Nondiagnostic electrocardiogram.  . CATARACT EXTRACTION     bilateral  . COLONOSCOPY  08/17/2008   adenomatous polyp, diverticulosis, external hemorrhoids  . COLONOSCOPY W/ BIOPSIES     multiple   . LEFT LOWER EXTREMITY VENOUS DOPPLER  06/18/2011   No evidence of left lower extremity DVT  . TRANSTHORACIC ECHOCARDIOGRAM  11/18/2012   EF [id number], LV systolic mild-moderately reduced, mild-moderate mitral valve regurg, mild-moderate tricuspid valve regurg. NSR-LBBB with occas PVCs  . UPPER GASTROINTESTINAL ENDOSCOPY  07/03/2010   hiatal hernia  . VARICOSE VEIN SURGERY      Family History  Problem Relation Age of Onset  . Heart disease Father   . Stroke Sister   . Colon cancer Neg Hx     Social History   Tobacco Use  . Smoking status: Never Smoker  . Smokeless tobacco: Never Used  Substance Use Topics  . Alcohol use: No    Alcohol/week: 0.0 standard drinks    Subjective:  Known history of IBS;  Patient notes that she had a "bad flare" yesterday and is actually feeling better today; was having constipation earlier this week and used Miralax and stool softener and that caused secondary diarrhea;  Is scheduled to see her GI next week in follow-up;   Typically takes Metronidazole when her symptoms flare up but has been hesitant to use this while on Doxycycline;   Objective:  Vitals:   05/25/19 1139  BP: 118/68  Pulse: 61  Temp: 98.2 F (36.8 C)  TempSrc: Oral  SpO2: 97%  Weight: 153 lb 9.6 oz (69.7 kg)  Height: 5\' 5"  (1.651 m)    General: Well developed, well nourished, in no acute distress  Skin : Warm and dry.  Head: Normocephalic and atraumatic  Lungs: Respirations unlabored; clear to auscultation bilaterally without wheeze, rales, rhonchi  CVS exam: normal rate and regular rhythm.  Abdomen: Soft; nontender; nondistended; normoactive bowel sounds; no masses or hepatosplenomegaly  Neurologic: Alert and oriented; speech intact; face symmetrical; moves all extremities well; CNII-XII intact without  focal deficit   Assessment:  1. Irritable bowel syndrome with diarrhea     Plan:  ? If Doxycycline has caused some of her recent symptoms; will stop this antibiotic and list as an allergy; Updated Rx for Dicyclomine which she has used in the past due to the persisting cramps; keep planned follow-up with her GI;  This visit occurred during the SARS-CoV-2 public health emergency.  Safety protocols were in place, including screening questions prior to the visit, additional usage of staff PPE, and extensive cleaning of exam room while observing appropriate contact time as indicated for disinfecting solutions.     No follow-ups on file.  No orders of the defined types were placed in this encounter.   Requested Prescriptions   Signed Prescriptions Disp Refills  . dicyclomine (BENTYL) 10 MG capsule 60 capsule 0    Sig: Take 1 capsule (10 mg total) by mouth 4 (four) times daily -  before meals and at bedtime. As needed for cramps

## 2019-05-30 ENCOUNTER — Encounter: Payer: Self-pay | Admitting: Internal Medicine

## 2019-05-30 ENCOUNTER — Ambulatory Visit: Payer: PPO | Admitting: Internal Medicine

## 2019-05-30 VITALS — BP 114/60 | HR 69 | Temp 97.0°F | Ht 65.0 in | Wt 155.0 lb

## 2019-05-30 DIAGNOSIS — K581 Irritable bowel syndrome with constipation: Secondary | ICD-10-CM

## 2019-05-30 MED ORDER — METRONIDAZOLE 500 MG PO TABS: 500.0000 mg | ORAL_TABLET | Freq: Three times a day (TID) | ORAL | 0 refills | Status: AC

## 2019-05-30 NOTE — Patient Instructions (Signed)
Please stay on Benefiber 1 teaspoon daily.  Take the higher dose of metronidazole 500 mg three times a day for 10 days.  If that fails to make things better let me know.  I appreciate the opportunity to care for you. Gatha Mayer, MD, [facility]

## 2019-05-30 NOTE — Progress Notes (Signed)
KARLETTA [name] 86 y.o. 1930-01-09 [id number]  Assessment & Plan:   Encounter Diagnosis  Name Primary?  . Irritable bowel syndrome with constipation Yes   Will try higher dose 10 d metronidazole  500 mg tid x 10 d Stay on low-does Benefiber 1 teaspoon a day  If this is not successful then call back   Subjective:   Chief Complaint: constipation  HPI 86 yo ww w/ long-standing IBS and also ? Component of SIBO - has had increasing constipation of late but says "fiber is too strong for me".  Tried a partial course of metronidazole 250 mg tid in early February because of increasing constipation and had transient benefit. Then needed doxycycline for sinusitis.  May go 2-3 days w/o defecation which worries her "I don't want to get a blockage"  Prunes used to help but not now  MiraLax = diarrhea  Saw Jodi Mourning, NP last week and told to keep this appt. Suggested Sennokot - has not tried  No bleeding  "I eat fruit, I walk. I just need to be able to regulate this myself"  Has not been same since second Covid shot she thinks  Wt Readings from Last 3 Encounters:  05/30/19 155 lb (70.3 kg)  05/25/19 153 lb 9.6 oz (69.7 kg)  02/22/19 154 lb (69.9 kg)     Allergies  Allergen Reactions  . Atorvastatin Other (See Comments)    Muscle aches  . Simvastatin Other (See Comments)    Myalgias   . Alendronate Sodium     REACTION: pt states INTOL to Fosamax w/ esophagitis  . Cefdinir     REACTION: diarrhea  . Doxycycline     diarrhea  . Esomeprazole Magnesium     REACTION: pt states INTOL to Nexium  . Levofloxacin     REACTION: diarrhea  . Omeprazole     REACTION: pt states INTOL to Prilosec  . Other     Pneumonia vaccine---felt tired and achy and sick x 6 months to get over this.  . Penicillins Hives  . Shellfish-Derived Products Hives, Nausea Only and Rash   Current Meds  Medication Sig  . acetaminophen (TYLENOL) 500 MG tablet Take 500 mg by mouth every 4  (four) hours as needed for mild pain or headache.  Marland Kitchen amLODipine (NORVASC) 10 MG tablet Take 1 tablet (10 mg total) by mouth daily.  . calcium carbonate (OS-CAL) 600 MG TABS tablet Take 600 mg by mouth 2 (two) times daily with a meal.  . cholecalciferol (VITAMIN D) 1000 UNITS tablet Take 2,000 Units by mouth daily.   Marland Kitchen dicyclomine (BENTYL) 10 MG capsule Take 1 capsule (10 mg total) by mouth 4 (four) times daily -  before meals and at bedtime. As needed for cramps  . fluticasone (FLONASE) 50 MCG/ACT nasal spray SPRAY 2 SPRAYS INTO EACH NOSTRIL EVERY DAY  . gabapentin (NEURONTIN) 100 MG capsule TAKE 1 CAPSULE BY MOUTH EVERY DAY AT BEDTIME AS NEEDED  . hydrochlorothiazide (HYDRODIURIL) 25 MG tablet TAKE 1/2 TABLET BY MOUTH EVERY DAY  . lansoprazole (PREVACID) 15 MG capsule TAKE 1 CAPSULE BY MOUTH TWICE A DAY BEFORE MEALS  . levocetirizine (XYZAL) 5 MG tablet Take 1 tablet (5 mg total) by mouth every evening.  Marland Kitchen LORazepam (ATIVAN) 0.5 MG tablet Take 1 tablet (0.5 mg total) by mouth at bedtime as needed for anxiety.  Marland Kitchen losartan (COZAAR) 50 MG tablet TAKE 1 TABLET BY MOUTH EVERY DAY  . metoprolol succinate (TOPROL-XL) 25 MG 24 hr  tablet TAKE 1 TABLET BY MOUTH EVERY DAY  . PREMARIN vaginal cream APPLY 1 APPLICATORFUL 2 3 TIMES A WEEK AS DIRECTED  . PROAIR HFA 108 (90 Base) MCG/ACT inhaler TAKE 2 PUFFS BY MOUTH EVERY 6 HOURS AS NEEDED FOR WHEEZE OR SHORTNESS OF BREATH  . Probiotic Product (ALIGN) 4 MG CAPS Take 4 mg by mouth as needed.   . RESTASIS 0.05 % ophthalmic emulsion   . rosuvastatin (CRESTOR) 10 MG tablet TAKE 1 TABLET BY MOUTH EVERY DAY  . SHINGRIX injection   . triamcinolone (NASACORT) 55 MCG/ACT AERO nasal inhaler Place 2 sprays into the nose daily.  Marland Kitchen triamcinolone cream (KENALOG) 0.5 % Apply 1 application topically 3 (three) times daily.  . [DISCONTINUED] traMADol (ULTRAM) 50 MG tablet Take 1 tablet (50 mg total) by mouth at bedtime as needed.   Past Medical History:  Diagnosis Date  .  Adenomatous colon polyp   . Allergy    seasonal  . Anemia    pt. denies  . Anxiety   . CAD (coronary artery disease)   . Cataract    cataracts removed left eye.  . Chronic lower back pain   . Diverticulosis   . GERD (gastroesophageal reflux disease)   . Headache(784.0)   . Hiatal hernia   . Hyperlipidemia   . Hypertension   . IBS (irritable bowel syndrome) 10/13/2011  . Osteoarthritis   . Osteoporosis   . Renal cysts, acquired, bilateral   . SBO (small bowel obstruction) ([place]) 09/05/2015  . Venous insufficiency    Past Surgical History:  Procedure Laterality Date  . ABDOMINAL HYSTERECTOMY    . BLADDER REPAIR    . CARDIAC CATHETERIZATION  01/12/2002   90% stenosis of the left circumflex, stented with a 3x48mm Cypher stent, resulting in reduction of 90% to 10%   . CARDIOVASCULAR STRESS TEST  03/22/2007   Mild inferolateral thinning toward the apex without significant ischemia. Nondiagnostic electrocardiogram.  . CATARACT EXTRACTION     bilateral  . COLONOSCOPY  08/17/2008   adenomatous polyp, diverticulosis, external hemorrhoids  . COLONOSCOPY W/ BIOPSIES     multiple   . LEFT LOWER EXTREMITY VENOUS DOPPLER  06/18/2011   No evidence of left lower extremity DVT  . TRANSTHORACIC ECHOCARDIOGRAM  11/18/2012   EF [id number], LV systolic mild-moderately reduced, mild-moderate mitral valve regurg, mild-moderate tricuspid valve regurg. NSR-LBBB with occas PVCs  . UPPER GASTROINTESTINAL ENDOSCOPY  07/03/2010   hiatal hernia  . VARICOSE VEIN SURGERY     Social History   Social History Narrative   Married to [employer] who has congestive heart failure problems   She is retired never smoker rare alcohol   family history includes Heart disease in her father; Stroke in her sister.   Review of Systems   Objective:   Physical Exam BP 114/60   Pulse 69   Temp (!) 97 F (36.1 C)   Ht 5\' 5"  (1.651 m)   Wt 155 lb (70.3 kg)   BMI 25.79 kg/m  abd soft and NT benign

## 2019-06-28 ENCOUNTER — Ambulatory Visit: Payer: PPO | Admitting: Physician Assistant

## 2019-06-28 ENCOUNTER — Encounter: Payer: Self-pay | Admitting: Physician Assistant

## 2019-06-28 VITALS — BP 120/66 | HR 76 | Temp 98.5°F | Ht 64.0 in | Wt 155.2 lb

## 2019-06-28 DIAGNOSIS — K5900 Constipation, unspecified: Secondary | ICD-10-CM | POA: Diagnosis not present

## 2019-06-28 DIAGNOSIS — K581 Irritable bowel syndrome with constipation: Secondary | ICD-10-CM

## 2019-06-28 DIAGNOSIS — R140 Abdominal distension (gaseous): Secondary | ICD-10-CM

## 2019-06-28 MED ORDER — LANSOPRAZOLE 30 MG PO CPDR: DELAYED_RELEASE_CAPSULE | ORAL | 3 refills | Status: DC

## 2019-06-28 NOTE — Patient Instructions (Addendum)
If you are age 84 or older, your body mass index should be between 23-30. Your Body mass index is 26.65 kg/m. If this is out of the aforementioned range listed, please consider follow up with your Primary Care Provider.  If you are age 62 or younger, your body mass index should be between 19-25. Your Body mass index is 26.65 kg/m. If this is out of the aformentioned range listed, please consider follow up with your Primary Care Provider.   Increase your Prevacid to 30 mg 1 capsule daily   Take 1 Gas-X before each meal.  Use Miralax 17 gm in 8 oz of water as needed for Constipation.  Continue Prune juice daily.  Follow up as needed with Nicoletta Ba, PA-C or Dr. Carlean Purl as needed.

## 2019-06-28 NOTE — Progress Notes (Signed)
Subjective:    Patient ID: Krystal [name], female    DOB: [birth year], 86 y.o.   MRN: [id number]  HPI Krystal [name] is a pleasant 86 year old white female, known to Dr. Carlean Purl who comes in today for follow-up after recent office visit on 05/30/2019.  She has history of IBS, diverticulosis, and adenomatous colon polyps.  Also with history of coronary artery disease, cardiomyopathy, chronic GERD, osteoarthritis, hypertension and chronic kidney disease. When she was seen earlier this month she was having problems with constipation and abdominal bloating and gassiness.  She was going 2 to 3 days without bowel movements.  She says she does not feel well if she does not have regular bowel movements and had had a "blockage" in the past which she wants to avoid. She was asked to start a trial of Benefiber which she did not tolerate, she says that caused increase in abdominal discomfort and bloating.  She was also given metronidazole 500 mg p.o. 3 times daily for possible SIBO.  She could not tolerate the metronidazole as it made her very nauseated, and she was only able to take this for a couple of days. Over the past 2 weeks she has been having more regular bowel movements.  She has been drinking prune juice with vitamin C on a daily basis.  She is also started Gas-X 1 daily which she says does help with the bloating.  She generally avoids fiber as she does not tolerate.  She is not drinking any carbonated beverages and is not using any artificial sweeteners on a regular basis. She is wondering whether she needs to increase Prevacid as she has been having a lot of burping and belching recently as well, no real heartburn and no dysphagia.  Review of Systems Pertinent positive and negative review of systems were noted in the above HPI section.  All other review of systems was otherwise negative.  Outpatient Encounter Medications as of 06/28/2019  Medication Sig  . acetaminophen (TYLENOL) 500 MG tablet Take 500 mg by  mouth every 4 (four) hours as needed for mild pain or headache.  Marland Kitchen amLODipine (NORVASC) 10 MG tablet Take 1 tablet (10 mg total) by mouth daily.  Marland Kitchen ascorbic acid (VITAMIN C) 500 MG tablet Take 500 mg by mouth daily.  . calcium carbonate (OS-CAL) 600 MG TABS tablet Take 600 mg by mouth 2 (two) times daily with a meal.  . cholecalciferol (VITAMIN D) 1000 UNITS tablet Take 2,000 Units by mouth daily.   Marland Kitchen dicyclomine (BENTYL) 10 MG capsule Take 1 capsule (10 mg total) by mouth 4 (four) times daily -  before meals and at bedtime. As needed for cramps  . docusate sodium (COLACE) 100 MG capsule Take 100 mg by mouth every other day.  . fluticasone (FLONASE) 50 MCG/ACT nasal spray SPRAY 2 SPRAYS INTO EACH NOSTRIL EVERY DAY  . gabapentin (NEURONTIN) 100 MG capsule TAKE 1 CAPSULE BY MOUTH EVERY DAY AT BEDTIME AS NEEDED  . hydrochlorothiazide (HYDRODIURIL) 25 MG tablet TAKE 1/2 TABLET BY MOUTH EVERY DAY  . lansoprazole (PREVACID) 30 MG capsule TAKE 1 CAPSULE BY MOUTH DAILY BEFORE MEALS  . levocetirizine (XYZAL) 5 MG tablet Take 1 tablet (5 mg total) by mouth every evening.  Marland Kitchen LORazepam (ATIVAN) 0.5 MG tablet Take 1 tablet (0.5 mg total) by mouth at bedtime as needed for anxiety.  Marland Kitchen losartan (COZAAR) 50 MG tablet TAKE 1 TABLET BY MOUTH EVERY DAY  . metoprolol succinate (TOPROL-XL) 25 MG 24 hr tablet TAKE 1  TABLET BY MOUTH EVERY DAY  . PREMARIN vaginal cream APPLY 1 APPLICATORFUL 2 3 TIMES A WEEK AS DIRECTED  . PROAIR HFA 108 (90 Base) MCG/ACT inhaler TAKE 2 PUFFS BY MOUTH EVERY 6 HOURS AS NEEDED FOR WHEEZE OR SHORTNESS OF BREATH  . Probiotic Product (ALIGN) 4 MG CAPS Take 4 mg by mouth as needed.   . RESTASIS 0.05 % ophthalmic emulsion   . rosuvastatin (CRESTOR) 10 MG tablet TAKE 1 TABLET BY MOUTH EVERY DAY  . SHINGRIX injection   . triamcinolone (NASACORT) 55 MCG/ACT AERO nasal inhaler Place 2 sprays into the nose daily.  Marland Kitchen triamcinolone cream (KENALOG) 0.5 % Apply 1 application topically 3 (three) times  daily.  . [DISCONTINUED] lansoprazole (PREVACID) 15 MG capsule TAKE 1 CAPSULE BY MOUTH TWICE A DAY BEFORE MEALS   No facility-administered encounter medications on file as of 06/28/2019.   Allergies  Allergen Reactions  . Atorvastatin Other (See Comments)    Muscle aches  . Simvastatin Other (See Comments)    Myalgias   . Alendronate Sodium     REACTION: pt states INTOL to Fosamax w/ esophagitis  . Cefdinir     REACTION: diarrhea  . Doxycycline     diarrhea  . Esomeprazole Magnesium     REACTION: pt states INTOL to Nexium  . Levofloxacin     REACTION: diarrhea  . Omeprazole     REACTION: pt states INTOL to Prilosec  . Other     Pneumonia vaccine---felt tired and achy and sick x 6 months to get over this.  . Penicillins Hives  . Shellfish-Derived Products Hives, Nausea Only and Rash   Patient Active Problem List   Diagnosis Date Noted  . Nausea 01/18/2019  . Allergic reaction to insect bite 12/29/2018  . Chronic renal disease, stage 3, moderately decreased glomerular filtration rate (GFR) between 30-59 mL/min/1.73 square meter 11/08/2018  . Chronic idiopathic constipation 10/19/2017  . LBBB (left bundle branch block) 07/16/2016  . Cardiomyopathy ([place]) 07/16/2016  . Benign paroxysmal positional vertigo 01/17/2016  . Nocturnal foot cramps 01/17/2016  . Hyperglycemia 05/23/2015  . IBS (irritable bowel syndrome) 10/13/2011  . GERD (gastroesophageal reflux disease) 06/25/2010  . RENAL CYST 11/18/2007  . OSTEOPOROSIS 11/18/2007  . Allergic rhinitis 10/06/2007  . Hyperlipidemia with target LDL less than 70 07/19/2007  . GAD (generalized anxiety disorder) 07/19/2007  . Essential hypertension 07/19/2007  . CAD S/P percutaneous coronary angioplasty 07/19/2007  . VENOUS INSUFFICIENCY 07/19/2007  . Osteoarthritis 07/19/2007  . LOW BACK PAIN SYNDROME 07/19/2007   Social History   Socioeconomic History  . Marital status: Married    Spouse name: [name]  . Number of children:  Not on file  . Years of education: Not on file  . Highest education level: Not on file  Occupational History  . Occupation: retired  Tobacco Use  . Smoking status: Never Smoker  . Smokeless tobacco: Never Used  Substance and Sexual Activity  . Alcohol use: No    Alcohol/week: 0.0 standard drinks  . Drug use: No  . Sexual activity: Not on file  Other Topics Concern  . Not on file  Social History Narrative   Married to [employer] who has congestive heart failure problems   She is retired never smoker rare alcohol   Social Determinants of Radio broadcast assistant Strain:   . Difficulty of Paying Living Expenses:   Food Insecurity:   . Worried About Charity fundraiser in the Last Year:   . Ran  Out of Food in the Last Year:   Transportation Needs:   . Lack of Transportation (Medical):   Marland Kitchen Lack of Transportation (Non-Medical):   Physical Activity:   . Days of Exercise per Week:   . Minutes of Exercise per Session:   Stress:   . Feeling of Stress :   Social Connections:   . Frequency of Communication with Friends and Family:   . Frequency of Social Gatherings with Friends and Family:   . Attends Religious Services:   . Active Member of Clubs or Organizations:   . Attends Archivist Meetings:   Marland Kitchen Marital Status:   Intimate Partner Violence:   . Fear of Current or Ex-Partner:   . Emotionally Abused:   Marland Kitchen Physically Abused:   . Sexually Abused:     Krystal [name]'s family history includes Heart disease in her father; Stroke in her sister.      Objective:    Vitals:   06/28/19 1400  BP: 120/66  Pulse: 76  Temp: 98.5 F (36.9 C)    Physical Exam Well-developed well-nourished elderly WF in no acute distress.  Height, Weight,155  BMI 26.6  HEENT; nontraumatic normocephalic, EOMI, PER R LA, sclera anicteric. Oropharynx; not examined Neck; supple, no JVD Cardiovascular; regular rate and rhythm with S1-S2, no murmur rub or gallop Pulmonary; Clear bilaterally  Abdomen; soft, no focal tenderness, nondistended, no palpable mass or hepatosplenomegaly, bowel sounds are active Rectal; not done today Skin; benign exam, no jaundice rash or appreciable lesions Extremities; no clubbing cyanosis or edema skin warm and dry Neuro/Psych; alert and oriented x4, grossly nonfocal mood and affect appropriate       Assessment & Plan:   #83 86 year old white female with long history of IBS with recent increase in constipation and abdominal bloating/gas  Unable to tolerate Benefiber or any fiber supplements Unable to tolerate course of metronidazole for possible SIBO due to nausea  Symptoms of settle down over the past couple of weeks and she is having more regular bowel movements with use of prune juice daily.  She still complains of some bloating but this is less when she is having regular bowel movements.  #2 chronic GERD-recent increase in symptoms with belching and burping  #3 history of diverticulosis #4.  History of adenomatous colon polyps-last colonoscopy 2010 #5 coronary artery disease and cardiomyopathy  Plan; continue daily prune juice.  We discussed liberal water intake about 60 ounces per day.  She has been exercising daily. Increase Gas-X to 1 before each meal Will increase Prevacid from 15 to 30 mg p.o. every morning, prescription sent We discussed as needed use of MiraLAX, she can start with even half a dose every other day if she develops recurrent mild constipation.  She will follow-up with Dr. Carlean Purl or myself on an as-needed basis and knows to call for problems.   Genia Harold PA-C 06/28/2019   Cc: Janith Lima, MD

## 2019-07-07 ENCOUNTER — Other Ambulatory Visit: Payer: Self-pay | Admitting: Internal Medicine

## 2019-07-07 DIAGNOSIS — I10 Essential (primary) hypertension: Secondary | ICD-10-CM

## 2019-07-14 ENCOUNTER — Telehealth: Payer: Self-pay

## 2019-07-14 NOTE — Telephone Encounter (Signed)
1.Medication Requested: fluticasone (FLONASE) 50 MCG/ACT nasal spray  2. Pharmacy (Name, Street, City):[id number] - [place], [place] [address]  3. On Med List:  Yes  4. Last Visit with PCP: 11.12.20  5. Next visit date with PCP: no appt is made at this time, wanted to know if Dr. Ronnald Ramp would send in rx without seeing her    Agent: Please be advised that RX refills may take up to 3 business days. We ask that you follow-up with your pharmacy.

## 2019-07-15 MED ORDER — FLUTICASONE PROPIONATE 50 MCG/ACT NA SUSP: 1.0000 | Freq: Every day | NASAL | 0 refills | Status: DC

## 2019-07-15 NOTE — Telephone Encounter (Signed)
erx sent in. No additional refill without an appointment.

## 2019-08-03 ENCOUNTER — Ambulatory Visit: Payer: PPO | Admitting: Internal Medicine

## 2019-08-03 ENCOUNTER — Other Ambulatory Visit: Payer: Self-pay | Admitting: Internal Medicine

## 2019-08-03 DIAGNOSIS — I10 Essential (primary) hypertension: Secondary | ICD-10-CM

## 2019-08-09 ENCOUNTER — Other Ambulatory Visit: Payer: Self-pay | Admitting: Internal Medicine

## 2019-08-17 ENCOUNTER — Telehealth (INDEPENDENT_AMBULATORY_CARE_PROVIDER_SITE_OTHER): Payer: PPO | Admitting: Family

## 2019-08-17 DIAGNOSIS — J019 Acute sinusitis, unspecified: Secondary | ICD-10-CM

## 2019-08-17 MED ORDER — PREDNISONE 20 MG PO TABS: 20.0000 mg | ORAL_TABLET | Freq: Every day | ORAL | 0 refills | Status: DC

## 2019-08-17 MED ORDER — AZITHROMYCIN 250 MG PO TABS: ORAL_TABLET | ORAL | 0 refills | Status: DC

## 2019-08-17 NOTE — Progress Notes (Signed)
Krystal [name] is a 86 y.o. female with the following history as recorded in EpicCare:  Patient Active Problem List   Diagnosis Date Noted  . Nausea 01/18/2019  . Allergic reaction to insect bite 12/29/2018  . Chronic renal disease, stage 3, moderately decreased glomerular filtration rate (GFR) between 30-59 mL/min/1.73 square meter 11/08/2018  . Chronic idiopathic constipation 10/19/2017  . LBBB (left bundle branch block) 07/16/2016  . Cardiomyopathy ([place]) 07/16/2016  . Benign paroxysmal positional vertigo 01/17/2016  . Nocturnal foot cramps 01/17/2016  . Hyperglycemia 05/23/2015  . IBS (irritable bowel syndrome) 10/13/2011  . GERD (gastroesophageal reflux disease) 06/25/2010  . RENAL CYST 11/18/2007  . OSTEOPOROSIS 11/18/2007  . Allergic rhinitis 10/06/2007  . Hyperlipidemia with target LDL less than 70 07/19/2007  . GAD (generalized anxiety disorder) 07/19/2007  . Essential hypertension 07/19/2007  . CAD S/P percutaneous coronary angioplasty 07/19/2007  . VENOUS INSUFFICIENCY 07/19/2007  . Osteoarthritis 07/19/2007  . LOW BACK PAIN SYNDROME 07/19/2007    Current Outpatient Medications  Medication Sig Dispense Refill  . acetaminophen (TYLENOL) 500 MG tablet Take 500 mg by mouth every 4 (four) hours as needed for mild pain or headache.    Marland Kitchen amLODipine (NORVASC) 10 MG tablet TAKE 1 TABLET BY MOUTH EVERY DAY 90 tablet 1  . ascorbic acid (VITAMIN C) 500 MG tablet Take 500 mg by mouth daily.    Marland Kitchen azithromycin (ZITHROMAX) 250 MG tablet 2 tabs po qd x 1 day; 1 tablet per day x 4 days; 6 tablet 0  . calcium carbonate (OS-CAL) 600 MG TABS tablet Take 600 mg by mouth 2 (two) times daily with a meal.    . cholecalciferol (VITAMIN D) 1000 UNITS tablet Take 2,000 Units by mouth daily.     Marland Kitchen dicyclomine (BENTYL) 10 MG capsule Take 1 capsule (10 mg total) by mouth 4 (four) times daily -  before meals and at bedtime. As needed for cramps 60 capsule 0  . docusate sodium (COLACE) 100 MG capsule  Take 100 mg by mouth every other day.    . fluticasone (FLONASE) 50 MCG/ACT nasal spray Place 1 spray into both nostrils daily. 16 g 0  . gabapentin (NEURONTIN) 100 MG capsule TAKE 1 CAPSULE BY MOUTH EVERY DAY AT BEDTIME AS NEEDED 90 capsule 1  . hydrochlorothiazide (HYDRODIURIL) 25 MG tablet TAKE 1/2 TABLET BY MOUTH EVERY DAY 45 tablet 2  . lansoprazole (PREVACID) 30 MG capsule TAKE 1 CAPSULE BY MOUTH DAILY BEFORE MEALS 90 capsule 3  . levocetirizine (XYZAL) 5 MG tablet Take 1 tablet (5 mg total) by mouth every evening. 90 tablet 1  . LORazepam (ATIVAN) 0.5 MG tablet Take 1 tablet (0.5 mg total) by mouth at bedtime as needed for anxiety. 30 tablet 0  . losartan (COZAAR) 50 MG tablet TAKE 1 TABLET BY MOUTH EVERY DAY 90 tablet 0  . metoprolol succinate (TOPROL-XL) 25 MG 24 hr tablet TAKE 1 TABLET BY MOUTH EVERY DAY 90 tablet 1  . predniSONE (DELTASONE) 20 MG tablet Take 1 tablet (20 mg total) by mouth daily with breakfast. 5 tablet 0  . PREMARIN vaginal cream APPLY 1 APPLICATORFUL 2 3 TIMES A WEEK AS DIRECTED    . PROAIR HFA 108 (90 Base) MCG/ACT inhaler TAKE 2 PUFFS BY MOUTH EVERY 6 HOURS AS NEEDED FOR WHEEZE OR SHORTNESS OF BREATH 8.5 Inhaler 2  . Probiotic Product (ALIGN) 4 MG CAPS Take 4 mg by mouth as needed.     . RESTASIS 0.05 % ophthalmic emulsion     .  rosuvastatin (CRESTOR) 10 MG tablet TAKE 1 TABLET BY MOUTH EVERY DAY 90 tablet 2  . SHINGRIX injection     . triamcinolone (NASACORT) 55 MCG/ACT AERO nasal inhaler Place 2 sprays into the nose daily. 1 Inhaler 12  . triamcinolone cream (KENALOG) 0.5 % Apply 1 application topically 3 (three) times daily. 30 g 0   No current facility-administered medications for this visit.    Allergies: Atorvastatin, Simvastatin, Alendronate sodium, Cefdinir, Doxycycline, Esomeprazole magnesium, Levofloxacin, Omeprazole, Other, Penicillins, and Shellfish-derived products  Past Medical History:  Diagnosis Date  . Adenomatous colon polyp   . Allergy     seasonal  . Anemia    pt. denies  . Anxiety   . CAD (coronary artery disease)   . Cataract    cataracts removed left eye.  . Chronic lower back pain   . Diverticulosis   . GERD (gastroesophageal reflux disease)   . Headache(784.0)   . Hiatal hernia   . Hyperlipidemia   . Hypertension   . IBS (irritable bowel syndrome) 10/13/2011  . Osteoarthritis   . Osteoporosis   . Renal cysts, acquired, bilateral   . SBO (small bowel obstruction) ([place]) 09/05/2015  . Venous insufficiency     Past Surgical History:  Procedure Laterality Date  . ABDOMINAL HYSTERECTOMY    . BLADDER REPAIR    . CARDIAC CATHETERIZATION  01/12/2002   90% stenosis of the left circumflex, stented with a 3x21mm Cypher stent, resulting in reduction of 90% to 10%   . CARDIOVASCULAR STRESS TEST  03/22/2007   Mild inferolateral thinning toward the apex without significant ischemia. Nondiagnostic electrocardiogram.  . CATARACT EXTRACTION     bilateral  . COLONOSCOPY  08/17/2008   adenomatous polyp, diverticulosis, external hemorrhoids  . COLONOSCOPY W/ BIOPSIES     multiple   . LEFT LOWER EXTREMITY VENOUS DOPPLER  06/18/2011   No evidence of left lower extremity DVT  . TRANSTHORACIC ECHOCARDIOGRAM  11/18/2012   EF [id number], LV systolic mild-moderately reduced, mild-moderate mitral valve regurg, mild-moderate tricuspid valve regurg. NSR-LBBB with occas PVCs  . UPPER GASTROINTESTINAL ENDOSCOPY  07/03/2010   hiatal hernia  . VARICOSE VEIN SURGERY      Family History  Problem Relation Age of Onset  . Heart disease Father   . Stroke Sister   . Colon cancer Neg Hx     Social History   Tobacco Use  . Smoking status: Never Smoker  . Smokeless tobacco: Never Used  Substance Use Topics  . Alcohol use: No    Alcohol/week: 0.0 standard drinks    Subjective:   I connected with [name] on 08/17/19 at 10:40 AM EDT by a telephone call and verified that I am speaking with the correct person using two identifiers.   I  discussed the limitations of evaluation and management by telemedicine and the availability of in person appointments. The patient expressed understanding and agreed to proceed. Provider in office/ patient is at home; provider and patient are only 2 people on phone call.   Complaining of sinus infection; symptoms x 1 week; is prone to recurrent sinus infections; using her allergy medication; requesting prescription for antibiotic and prednisone;   She also mentions that she has been having problems with swelling in her ankles for the past few months; wonders about scheduling in office appointment; has not seen her PCP since 11/2018;  Objective:  There were no vitals filed for this visit.  Lungs: Respirations unlabored;  Neurologic: Alert and oriented; speech intact;  Assessment:  1. Acute sinusitis, recurrence not specified, unspecified location     Plan:  Rx for Z-pak and Prednisone; continue Zyrtec; increase fluids, rest and follow-up worse, no better.  She is scheduled for in office visit next Monday to have her ankles evaluated in person.  Time spent 10 minutes  No follow-ups on file.  No orders of the defined types were placed in this encounter.   Requested Prescriptions   Signed Prescriptions Disp Refills  . azithromycin (ZITHROMAX) 250 MG tablet 6 tablet 0    Sig: 2 tabs po qd x 1 day; 1 tablet per day x 4 days;  . predniSONE (DELTASONE) 20 MG tablet 5 tablet 0    Sig: Take 1 tablet (20 mg total) by mouth daily with breakfast.

## 2019-08-22 ENCOUNTER — Ambulatory Visit (INDEPENDENT_AMBULATORY_CARE_PROVIDER_SITE_OTHER): Payer: PPO | Admitting: Internal Medicine

## 2019-08-22 ENCOUNTER — Encounter: Payer: Self-pay | Admitting: Internal Medicine

## 2019-08-22 ENCOUNTER — Other Ambulatory Visit: Payer: Self-pay

## 2019-08-22 VITALS — BP 130/60 | HR 70 | Temp 97.9°F | Resp 16 | Ht 64.0 in | Wt 154.4 lb

## 2019-08-22 DIAGNOSIS — G8929 Other chronic pain: Secondary | ICD-10-CM | POA: Diagnosis not present

## 2019-08-22 DIAGNOSIS — I10 Essential (primary) hypertension: Secondary | ICD-10-CM

## 2019-08-22 DIAGNOSIS — M792 Neuralgia and neuritis, unspecified: Secondary | ICD-10-CM | POA: Diagnosis not present

## 2019-08-22 DIAGNOSIS — G2581 Restless legs syndrome: Secondary | ICD-10-CM | POA: Insufficient documentation

## 2019-08-22 DIAGNOSIS — Z91038 Other insect allergy status: Secondary | ICD-10-CM

## 2019-08-22 DIAGNOSIS — J301 Allergic rhinitis due to pollen: Secondary | ICD-10-CM | POA: Diagnosis not present

## 2019-08-22 MED ORDER — PREGABALIN 50 MG PO CAPS: 50.0000 mg | ORAL_CAPSULE | Freq: Every day | ORAL | 0 refills | Status: DC

## 2019-08-22 MED ORDER — AZELASTINE HCL 0.1 % NA SOLN: 2.0000 | Freq: Two times a day (BID) | NASAL | 1 refills | Status: DC

## 2019-08-22 MED ORDER — LEVOCETIRIZINE DIHYDROCHLORIDE 5 MG PO TABS: 5.0000 mg | ORAL_TABLET | Freq: Every evening | ORAL | 1 refills | Status: DC

## 2019-08-22 NOTE — Progress Notes (Signed)
Subjective:  Patient ID: Krystal [name], female    DOB: [birth year]  Age: 86 y.o. MRN: [id number]  CC: Hypertension and Allergic Rhinitis   This visit occurred during the SARS-CoV-2 public health emergency.  Safety protocols were in place, including screening questions prior to the visit, additional usage of staff PPE, and extensive cleaning of exam room while observing appropriate contact time as indicated for disinfecting solutions.    HPI Krystal [name] presents for f/up - She saw someone else recently for a suspected sinus infection.  She was prescribed azithromycin and prednisone.  In some ways she feels better but she continues to complain of postnasal drip and throat clearing.  She tells me she is not getting much symptom relief with Flonase nasal spray.  She also complains of a 24-month history of burning in her lower extremities between the knees and the ankles.  The symptoms interfere with her sleep.  She also has excessive movements during her sleep but has never kicked her husband.  She denies numbness, weakness, or tingling in her lower extremities.  She also complains of swelling around her ankles.  Outpatient Medications Prior to Visit  Medication Sig Dispense Refill  . acetaminophen (TYLENOL) 500 MG tablet Take 500 mg by mouth every 4 (four) hours as needed for mild pain or headache.    Marland Kitchen ascorbic acid (VITAMIN C) 500 MG tablet Take 500 mg by mouth daily.    . calcium carbonate (OS-CAL) 600 MG TABS tablet Take 600 mg by mouth 2 (two) times daily with a meal.    . cholecalciferol (VITAMIN D) 1000 UNITS tablet Take 2,000 Units by mouth daily.     Marland Kitchen dicyclomine (BENTYL) 10 MG capsule Take 1 capsule (10 mg total) by mouth 4 (four) times daily -  before meals and at bedtime. As needed for cramps 60 capsule 0  . docusate sodium (COLACE) 100 MG capsule Take 100 mg by mouth every other day.    . fluticasone (FLONASE) 50 MCG/ACT nasal spray Place 1 spray into both nostrils daily. 16 g 0  .  hydrochlorothiazide (HYDRODIURIL) 25 MG tablet TAKE 1/2 TABLET BY MOUTH EVERY DAY 45 tablet 2  . lansoprazole (PREVACID) 30 MG capsule TAKE 1 CAPSULE BY MOUTH DAILY BEFORE MEALS 90 capsule 3  . LORazepam (ATIVAN) 0.5 MG tablet Take 1 tablet (0.5 mg total) by mouth at bedtime as needed for anxiety. 30 tablet 0  . losartan (COZAAR) 50 MG tablet TAKE 1 TABLET BY MOUTH EVERY DAY 90 tablet 0  . metoprolol succinate (TOPROL-XL) 25 MG 24 hr tablet TAKE 1 TABLET BY MOUTH EVERY DAY 90 tablet 1  . PREMARIN vaginal cream APPLY 1 APPLICATORFUL 2 3 TIMES A WEEK AS DIRECTED    . PROAIR HFA 108 (90 Base) MCG/ACT inhaler TAKE 2 PUFFS BY MOUTH EVERY 6 HOURS AS NEEDED FOR WHEEZE OR SHORTNESS OF BREATH 8.5 Inhaler 2  . Probiotic Product (ALIGN) 4 MG CAPS Take 4 mg by mouth as needed.     . RESTASIS 0.05 % ophthalmic emulsion     . rosuvastatin (CRESTOR) 10 MG tablet TAKE 1 TABLET BY MOUTH EVERY DAY 90 tablet 2  . SHINGRIX injection     . triamcinolone (NASACORT) 55 MCG/ACT AERO nasal inhaler Place 2 sprays into the nose daily. 1 Inhaler 12  . triamcinolone cream (KENALOG) 0.5 % Apply 1 application topically 3 (three) times daily. 30 g 0  . amLODipine (NORVASC) 10 MG tablet TAKE 1 TABLET BY MOUTH EVERY  DAY 90 tablet 1  . gabapentin (NEURONTIN) 100 MG capsule TAKE 1 CAPSULE BY MOUTH EVERY DAY AT BEDTIME AS NEEDED 90 capsule 1  . levocetirizine (XYZAL) 5 MG tablet Take 1 tablet (5 mg total) by mouth every evening. 90 tablet 1  . predniSONE (DELTASONE) 20 MG tablet Take 1 tablet (20 mg total) by mouth daily with breakfast. 5 tablet 0  . azithromycin (ZITHROMAX) 250 MG tablet 2 tabs po qd x 1 day; 1 tablet per day x 4 days; 6 tablet 0   No facility-administered medications prior to visit.    ROS Review of Systems  Constitutional: Negative.  Negative for diaphoresis and fatigue.  HENT: Positive for postnasal drip. Negative for congestion, rhinorrhea, sinus pressure, sore throat and trouble swallowing.     Respiratory: Negative.  Negative for cough, chest tightness, shortness of breath and wheezing.   Cardiovascular: Positive for leg swelling. Negative for chest pain and palpitations.  Gastrointestinal: Negative for abdominal pain, constipation, diarrhea, nausea and vomiting.  Genitourinary: Negative.  Negative for difficulty urinating and dysuria.  Musculoskeletal: Negative.   Skin: Negative.   Neurological: Negative.  Negative for dizziness, weakness, light-headedness and numbness.  Hematological: Negative for adenopathy. Does not bruise/bleed easily.  Psychiatric/Behavioral: Negative.     Objective:  BP 130/60 (BP Location: Left Arm, Patient Position: Sitting, Cuff Size: Normal)   Pulse 70   Temp 97.9 F (36.6 C) (Oral)   Resp 16   Ht 5\' 4"  (1.626 m)   Wt 154 lb 6 oz (70 kg)   SpO2 95%   BMI 26.50 kg/m   BP Readings from Last 3 Encounters:  08/22/19 130/60  06/28/19 120/66  05/30/19 114/60    Wt Readings from Last 3 Encounters:  08/22/19 154 lb 6 oz (70 kg)  06/28/19 155 lb 4 oz (70.4 kg)  05/30/19 155 lb (70.3 kg)    Physical Exam Vitals reviewed.  Constitutional:      Appearance: Normal appearance.  HENT:     Nose: Nose normal.     Mouth/Throat:     Mouth: Mucous membranes are moist.  Eyes:     General: No scleral icterus.    Conjunctiva/sclera: Conjunctivae normal.  Cardiovascular:     Rate and Rhythm: Normal rate and regular rhythm.     Heart sounds: No murmur.  Pulmonary:     Effort: Pulmonary effort is normal.     Breath sounds: No stridor. No wheezing, rhonchi or rales.  Abdominal:     General: Abdomen is flat.     Palpations: There is no mass.     Tenderness: There is no abdominal tenderness. There is no guarding.  Musculoskeletal:        General: No tenderness. Normal range of motion.     Cervical back: Neck supple.     Right lower leg: Edema present.     Left lower leg: Edema present.  Lymphadenopathy:     Cervical: No cervical adenopathy.   Skin:    General: Skin is warm and dry.     Comments: There is trace edema around both ankles  Neurological:     General: No focal deficit present.     Mental Status: She is alert.     Cranial Nerves: Cranial nerves are intact. No cranial nerve deficit.     Sensory: Sensation is intact. No sensory deficit.     Motor: No weakness.     Coordination: Coordination normal.     Gait: Gait normal.  Deep Tendon Reflexes: Reflexes normal.     Reflex Scores:      Tricep reflexes are 0 on the right side and 0 on the left side.      Bicep reflexes are 0 on the right side and 0 on the left side.      Brachioradialis reflexes are 0 on the right side and 0 on the left side.      Patellar reflexes are 0 on the right side and 0 on the left side.      Achilles reflexes are 0 on the right side and 0 on the left side. Psychiatric:        Mood and Affect: Mood normal.        Behavior: Behavior normal.     Lab Results  Component Value Date   WBC 9.7 02/11/2019   HGB 14.6 02/11/2019   HCT 43.7 02/11/2019   PLT 228.0 02/11/2019   GLUCOSE 101 (H) 02/11/2019   CHOL 132 11/08/2018   TRIG 133.0 11/08/2018   HDL 44.50 11/08/2018   LDLDIRECT 160.4 12/19/2008   LDLCALC 61 11/08/2018   ALT 16 02/11/2019   AST 18 02/11/2019   NA 139 02/11/2019   K 3.7 02/11/2019   CL 101 02/11/2019   CREATININE 1.23 (H) 02/11/2019   BUN 20 02/11/2019   CO2 28 02/11/2019   TSH 1.62 11/08/2018   HGBA1C 6.0 11/08/2018    DG Shoulder Right  Result Date: 12/11/2017 CLINICAL DATA:  Lateral right shoulder pain since a fall 1 week ago. EXAM: RIGHT SHOULDER - 2+ VIEW COMPARISON:  None. FINDINGS: Diffuse osteopenia. No fracture or dislocation seen. IMPRESSION: No fracture or dislocation. Electronically Signed   By: Claudie Revering M.D.   On: 12/11/2017 11:38    Assessment & Plan:   Shakeera was seen today for hypertension and allergic rhinitis .  Diagnoses and all orders for this visit:  Non-seasonal allergic rhinitis  due to pollen -     azelastine (ASTELIN) 0.1 % nasal spray; Place 2 sprays into both nostrils 2 (two) times daily. Use in each nostril as directed -     levocetirizine (XYZAL) 5 MG tablet; Take 1 tablet (5 mg total) by mouth every evening.  Restless legs syndrome (RLS) -     pregabalin (LYRICA) 50 MG capsule; Take 1 capsule (50 mg total) by mouth at bedtime.  Chronic peripheral neuropathic pain -     pregabalin (LYRICA) 50 MG capsule; Take 1 capsule (50 mg total) by mouth at bedtime.  Allergic reaction to insect bite -     Discontinue: levocetirizine (XYZAL) 5 MG tablet; Take 1 tablet (5 mg total) by mouth every evening.  Essential hypertension- Her blood pressure is adequately well controlled but she complains of ankle edema.  This is likely related to the calcium channel blocker so I have asked her to stop taking amlodipine and to continue the other antihypertensives.   I have discontinued Usha L. Ludwick's gabapentin, amLODipine, azithromycin, and predniSONE. I am also having her start on pregabalin and azelastine. Additionally, I am having her maintain her Align, cholecalciferol, calcium carbonate, acetaminophen, ProAir HFA, Restasis, triamcinolone, rosuvastatin, triamcinolone cream, hydrochlorothiazide, metoprolol succinate, LORazepam, Premarin, Shingrix, dicyclomine, docusate sodium, ascorbic acid, lansoprazole, fluticasone, losartan, and levocetirizine.  Meds ordered this encounter  Medications  . pregabalin (LYRICA) 50 MG capsule    Sig: Take 1 capsule (50 mg total) by mouth at bedtime.    Dispense:  90 capsule    Refill:  0  .  DISCONTD: levocetirizine (XYZAL) 5 MG tablet    Sig: Take 1 tablet (5 mg total) by mouth every evening.    Dispense:  90 tablet    Refill:  1  . azelastine (ASTELIN) 0.1 % nasal spray    Sig: Place 2 sprays into both nostrils 2 (two) times daily. Use in each nostril as directed    Dispense:  90 mL    Refill:  1  . levocetirizine (XYZAL) 5 MG tablet     Sig: Take 1 tablet (5 mg total) by mouth every evening.    Dispense:  90 tablet    Refill:  1     Follow-up: Return in about 3 months (around 11/22/2019).  Scarlette Calico, MD

## 2019-08-22 NOTE — Patient Instructions (Signed)
Neuropathic Pain Neuropathic pain is pain caused by damage to the nerves that are responsible for certain sensations in your body (sensory nerves). The pain can be caused by:  Damage to the sensory nerves that send signals to your spinal cord and brain (peripheral nervous system).  Damage to the sensory nerves in your brain or spinal cord (central nervous system). Neuropathic pain can make you more sensitive to pain. Even a minor sensation can feel very painful. This is usually a long-term condition that can be difficult to treat. The type of pain differs from person to person. It may:  Start suddenly (acute), or it may develop slowly and last for a long time (chronic).  Come and go as damaged nerves heal, or it may stay at the same level for years.  Cause emotional distress, loss of sleep, and a lower quality of life. What are the causes? The most common cause of this condition is diabetes. Many other diseases and conditions can also cause neuropathic pain. Causes of neuropathic pain can be classified as:  Toxic. This is caused by medicines and chemicals. The most common cause of toxic neuropathic pain is damage from cancer treatments (chemotherapy).  Metabolic. This can be caused by: ? Diabetes. This is the most common disease that damages the nerves. ? Lack of vitamin B from long-term alcohol abuse.  Traumatic. Any injury that cuts, crushes, or stretches a nerve can cause damage and pain. A common example is feeling pain after losing an arm or leg (phantom limb pain).  Compression-related. If a sensory nerve gets trapped or compressed for a long period of time, the blood supply to the nerve can be cut off.  Vascular. Many blood vessel diseases can cause neuropathic pain by decreasing blood supply and oxygen to nerves.  Autoimmune. This type of pain results from diseases in which the body's defense system (immune system) mistakenly attacks sensory nerves. Examples of autoimmune diseases  that can cause neuropathic pain include lupus and multiple sclerosis.  Infectious. Many types of viral infections can damage sensory nerves and cause pain. Shingles infection is a common cause of this type of pain.  Inherited. Neuropathic pain can be a symptom of many diseases that are passed down through families (genetic). What increases the risk? You are more likely to develop this condition if:  You have diabetes.  You smoke.  You drink too much alcohol.  You are taking certain medicines, including medicines that kill cancer cells (chemotherapy) or that treat immune system disorders. What are the signs or symptoms? The main symptom is pain. Neuropathic pain is often described as:  Burning.  Shock-like.  Stinging.  Hot or cold.  Itching. How is this diagnosed? No single test can diagnose neuropathic pain. It is diagnosed based on:  Physical exam and your symptoms. Your health care provider will ask you about your pain. You may be asked to use a pain scale to describe how bad your pain is.  Tests. These may be done to see if you have a high sensitivity to pain and to help find the cause and location of any sensory nerve damage. They include: ? Nerve conduction studies to test how well nerve signals travel through your sensory nerves (electrodiagnostic testing). ? Stimulating your sensory nerves through electrodes on your skin and measuring the response in your spinal cord and brain (somatosensory evoked potential).  Imaging studies, such as: ? X-rays. ? CT scan. ? MRI. How is this treated? Treatment for neuropathic pain may change   over time. You may need to try different treatment options or a combination of treatments. Some options include:  Treating the underlying cause of the neuropathy, such as diabetes, kidney disease, or vitamin deficiencies.  Stopping medicines that can cause neuropathy, such as chemotherapy.  Medicine to relieve pain. Medicines may  include: ? Prescription or over-the-counter pain medicine. ? Anti-seizure medicine. ? Antidepressant medicines. ? Pain-relieving patches that are applied to painful areas of skin. ? A medicine to numb the area (local anesthetic), which can be injected as a nerve block.  Transcutaneous nerve stimulation. This uses electrical currents to block painful nerve signals. The treatment is painless.  Alternative treatments, such as: ? Acupuncture. ? Meditation. ? Massage. ? Physical therapy. ? Pain management programs. ? Counseling. Follow these instructions at home: Medicines   Take over-the-counter and prescription medicines only as told by your health care provider.  Do not drive or use heavy machinery while taking prescription pain medicine.  If you are taking prescription pain medicine, take actions to prevent or treat constipation. Your health care provider may recommend that you: ? Drink enough fluid to keep your urine pale yellow. ? Eat foods that are high in fiber, such as fresh fruits and vegetables, whole grains, and beans. ? Limit foods that are high in fat and processed sugars, such as fried or sweet foods. ? Take an over-the-counter or prescription medicine for constipation. Lifestyle   Have a good support system at home.  Consider joining a chronic pain support group.  Do not use any products that contain nicotine or tobacco, such as cigarettes and e-cigarettes. If you need help quitting, ask your health care provider.  Do not drink alcohol. General instructions  Learn as much as you can about your condition.  Work closely with all your health care providers to find the treatment plan that works best for you.  Ask your health care provider what activities are safe for you.  Keep all follow-up visits as told by your health care provider. This is important. Contact a health care provider if:  Your pain treatments are not working.  You are having side effects  from your medicines.  You are struggling with tiredness (fatigue), mood changes, depression, or anxiety. Summary  Neuropathic pain is pain caused by damage to the nerves that are responsible for certain sensations in your body (sensory nerves).  Neuropathic pain may come and go as damaged nerves heal, or it may stay at the same level for years.  Neuropathic pain is usually a long-term condition that can be difficult to treat. Consider joining a chronic pain support group. This information is not intended to replace advice given to you by your health care provider. Make sure you discuss any questions you have with your health care provider. Document Revised: 07/08/2018 Document Reviewed: 04/03/2017 Elsevier Patient Education  2020 Elsevier Inc.  

## 2019-09-05 ENCOUNTER — Other Ambulatory Visit: Payer: Self-pay | Admitting: Cardiovascular Disease

## 2019-09-05 DIAGNOSIS — E785 Hyperlipidemia, unspecified: Secondary | ICD-10-CM

## 2019-09-05 DIAGNOSIS — I2510 Atherosclerotic heart disease of native coronary artery without angina pectoris: Secondary | ICD-10-CM

## 2019-09-21 ENCOUNTER — Ambulatory Visit: Payer: PPO | Admitting: Cardiovascular Disease

## 2019-09-21 ENCOUNTER — Other Ambulatory Visit: Payer: Self-pay

## 2019-09-21 ENCOUNTER — Encounter: Payer: Self-pay | Admitting: Cardiovascular Disease

## 2019-09-21 VITALS — BP 142/84 | HR 71 | Ht 64.0 in | Wt 154.0 lb

## 2019-09-21 DIAGNOSIS — E785 Hyperlipidemia, unspecified: Secondary | ICD-10-CM

## 2019-09-21 DIAGNOSIS — I2510 Atherosclerotic heart disease of native coronary artery without angina pectoris: Secondary | ICD-10-CM

## 2019-09-21 DIAGNOSIS — I447 Left bundle-branch block, unspecified: Secondary | ICD-10-CM

## 2019-09-21 DIAGNOSIS — I10 Essential (primary) hypertension: Secondary | ICD-10-CM | POA: Diagnosis not present

## 2019-09-21 DIAGNOSIS — I420 Dilated cardiomyopathy: Secondary | ICD-10-CM

## 2019-09-21 DIAGNOSIS — Z9861 Coronary angioplasty status: Secondary | ICD-10-CM

## 2019-09-21 MED ORDER — HYDROCHLOROTHIAZIDE 25 MG PO TABS: 12.5000 mg | ORAL_TABLET | Freq: Every day | ORAL | 2 refills | Status: DC

## 2019-09-21 MED ORDER — ROSUVASTATIN CALCIUM 10 MG PO TABS: 10.0000 mg | ORAL_TABLET | Freq: Every day | ORAL | 3 refills | Status: DC

## 2019-09-21 NOTE — Progress Notes (Signed)
09/21/2019 Krystal [name]   [birth year]  [id number]  Primary Physician Janith Lima, MD Primary Cardiologist: Lorretta Harp MD Lupe Carney, [place]  HPI:  Krystal [name] is a 86 y.o.  mildly overweight married Caucasian female mother of 2 children, grandmother of 3 grandchildren, whose husband [name] is also a patient of mine. They were both patients of Dr. Georgiann Mccoy. I last spoke to her on the phone for a virtual telemedicine visit 09/14/2018.  Her past history is remarkable for treated hypertension and hyperlipidemia. Her father did die of a myocardial infarction. She has never had a heart attack or stroke. She does not smoke. She had a Cypher drug-eluting stent placed in her circumflex coronary artery by Dr. Eustace Quail in 2003. Her other arteries were normal as was her LV function.  She did have a 2D echo performed 11/18/2012 that showed an EF in the 45% range.  She is very active and walks for an hour every morning.  Since I spoke to her on the phone a year ago she continues to do well.  She does exercise for an hour and walks with her husband every morning.  She denies chest pain or shortness of breath..   Current Meds  Medication Sig  . acetaminophen (TYLENOL) 500 MG tablet Take 500 mg by mouth every 4 (four) hours as needed for mild pain or headache.  Marland Kitchen ascorbic acid (VITAMIN C) 500 MG tablet Take 500 mg by mouth daily.  Marland Kitchen azelastine (ASTELIN) 0.1 % nasal spray Place 2 sprays into both nostrils 2 (two) times daily. Use in each nostril as directed  . calcium carbonate (OS-CAL) 600 MG TABS tablet Take 600 mg by mouth 2 (two) times daily with a meal.  . cholecalciferol (VITAMIN D) 1000 UNITS tablet Take 2,000 Units by mouth daily.   Marland Kitchen dicyclomine (BENTYL) 10 MG capsule Take 1 capsule (10 mg total) by mouth 4 (four) times daily -  before meals and at bedtime. As needed for cramps  . docusate sodium (COLACE) 100 MG capsule Take 100 mg by mouth every other day.  .  fluticasone (FLONASE) 50 MCG/ACT nasal spray Place 1 spray into both nostrils daily.  . hydrochlorothiazide (HYDRODIURIL) 25 MG tablet TAKE 1/2 TABLET BY MOUTH EVERY DAY  . lansoprazole (PREVACID) 30 MG capsule TAKE 1 CAPSULE BY MOUTH DAILY BEFORE MEALS  . levocetirizine (XYZAL) 5 MG tablet Take 1 tablet (5 mg total) by mouth every evening.  Marland Kitchen LORazepam (ATIVAN) 0.5 MG tablet Take 1 tablet (0.5 mg total) by mouth at bedtime as needed for anxiety.  Marland Kitchen losartan (COZAAR) 50 MG tablet TAKE 1 TABLET BY MOUTH EVERY DAY  . metoprolol succinate (TOPROL-XL) 25 MG 24 hr tablet TAKE 1 TABLET BY MOUTH EVERY DAY  . pregabalin (LYRICA) 50 MG capsule Take 1 capsule (50 mg total) by mouth at bedtime.  Marland Kitchen PREMARIN vaginal cream APPLY 1 APPLICATORFUL 2 3 TIMES A WEEK AS DIRECTED  . PROAIR HFA 108 (90 Base) MCG/ACT inhaler TAKE 2 PUFFS BY MOUTH EVERY 6 HOURS AS NEEDED FOR WHEEZE OR SHORTNESS OF BREATH  . Probiotic Product (ALIGN) 4 MG CAPS Take 4 mg by mouth as needed.   . RESTASIS 0.05 % ophthalmic emulsion   . rosuvastatin (CRESTOR) 10 MG tablet Take 1 tablet (10 mg total) by mouth daily. Appointment needed for further refillls  . SHINGRIX injection   . triamcinolone (NASACORT) 55 MCG/ACT AERO nasal inhaler Place 2 sprays into the nose  daily.  . triamcinolone cream (KENALOG) 0.5 % Apply 1 application topically 3 (three) times daily.     Allergies  Allergen Reactions  . Atorvastatin Other (See Comments)    Muscle aches  . Simvastatin Other (See Comments)    Myalgias   . Alendronate Sodium     REACTION: pt states INTOL to Fosamax w/ esophagitis  . Cefdinir     REACTION: diarrhea  . Doxycycline     diarrhea  . Esomeprazole Magnesium     REACTION: pt states INTOL to Nexium  . Levofloxacin     REACTION: diarrhea  . Omeprazole     REACTION: pt states INTOL to Prilosec  . Other     Pneumonia vaccine---felt tired and achy and sick x 6 months to get over this.  . Penicillins Hives  . Amlodipine Other  (See Comments)    Ankle edema  . Shellfish-Derived Products Hives, Nausea Only and Rash    Social History   Socioeconomic History  . Marital status: Married    Spouse name: [name]  . Number of children: Not on file  . Years of education: Not on file  . Highest education level: Not on file  Occupational History  . Occupation: retired  Tobacco Use  . Smoking status: Never Smoker  . Smokeless tobacco: Never Used  Vaping Use  . Vaping Use: Never used  Substance and Sexual Activity  . Alcohol use: No    Alcohol/week: 0.0 standard drinks  . Drug use: No  . Sexual activity: Not on file  Other Topics Concern  . Not on file  Social History Narrative   Married to [employer] who has congestive heart failure problems   She is retired never smoker rare alcohol   Social Determinants of Radio broadcast assistant Strain:   . Difficulty of Paying Living Expenses:   Food Insecurity:   . Worried About Charity fundraiser in the Last Year:   . Arboriculturist in the Last Year:   Transportation Needs:   . Film/video editor (Medical):   Marland Kitchen Lack of Transportation (Non-Medical):   Physical Activity:   . Days of Exercise per Week:   . Minutes of Exercise per Session:   Stress:   . Feeling of Stress :   Social Connections:   . Frequency of Communication with Friends and Family:   . Frequency of Social Gatherings with Friends and Family:   . Attends Religious Services:   . Active Member of Clubs or Organizations:   . Attends Archivist Meetings:   Marland Kitchen Marital Status:   Intimate Partner Violence:   . Fear of Current or Ex-Partner:   . Emotionally Abused:   Marland Kitchen Physically Abused:   . Sexually Abused:      Review of Systems: General: negative for chills, fever, night sweats or weight changes.  Cardiovascular: negative for chest pain, dyspnea on exertion, edema, orthopnea, palpitations, paroxysmal nocturnal dyspnea or shortness of breath Dermatological: negative for  rash Respiratory: negative for cough or wheezing Urologic: negative for hematuria Abdominal: negative for nausea, vomiting, diarrhea, bright red blood per rectum, melena, or hematemesis Neurologic: negative for visual changes, syncope, or dizziness All other systems reviewed and are otherwise negative except as noted above.    Blood pressure (!) 142/84, pulse 71, height 5\' 4"  (1.626 m), weight 154 lb (69.9 kg), SpO2 95 %.  General appearance: alert and no distress Neck: no adenopathy, no carotid bruit, no JVD, supple, symmetrical, trachea  midline and thyroid not enlarged, symmetric, no tenderness/mass/nodules Lungs: clear to auscultation bilaterally Heart: regular rate and rhythm, S1, S2 normal, no murmur, click, rub or gallop Extremities: extremities normal, atraumatic, no cyanosis or edema Pulses: 2+ and symmetric Skin: Skin color, texture, turgor normal. No rashes or lesions Neurologic: Alert and oriented X 3, normal strength and tone. Normal symmetric reflexes. Normal coordination and gait  EKG sinus rhythm at 71 with left bundle branch block.  I personally reviewed this EKG.  ASSESSMENT AND PLAN:   Hyperlipidemia with target LDL less than 70 History of hyperlipidemia on statin therapy with lipid profile performed 11/08/2018 revealing a total cholesterol of 132, LDL 61 and HDL of 44.  Essential hypertension History of essential hypertension blood pressure measured today 142/84 although at home it is much lower than this.  She was on amlodipine which was discontinued by her PCP because of ankle edema.  She is on HydroDIURIL and losartan.  CAD S/P percutaneous coronary angioplasty History of CAD status post circumflex PCI and drug-eluting stenting using a Cypher drug-eluting stent by Dr. Olevia Perches  in 2003.  She is been asymptomatic since.  LBBB (left bundle branch block) Chronic      Lorretta Harp MD [facility], [facility] 09/21/2019 10:40 AM

## 2019-09-21 NOTE — Assessment & Plan Note (Signed)
Chronic. 

## 2019-09-21 NOTE — Assessment & Plan Note (Signed)
History of CAD status post circumflex PCI and drug-eluting stenting using a Cypher drug-eluting stent by Dr. Olevia Perches  in 2003.  She is been asymptomatic since.

## 2019-09-21 NOTE — Assessment & Plan Note (Signed)
History of hyperlipidemia on statin therapy with lipid profile performed 11/08/2018 revealing a total cholesterol of 132, LDL 61 and HDL of 44.

## 2019-09-21 NOTE — Addendum Note (Signed)
Addended by: Therisa Doyne on: 09/21/2019 12:19 PM   Modules accepted: Orders

## 2019-09-21 NOTE — Patient Instructions (Signed)
Medication Instructions:  Your physician recommends that you continue on your current medications as directed. Please refer to the Current Medication list given to you today.  *If you need a refill on your cardiac medications before your next appointment, please call your pharmacy*   Follow-Up: At [facility], you and your health needs are our priority.  As part of our continuing mission to provide you with exceptional heart care, we have created designated Provider Care Teams.  These Care Teams include your primary Cardiologist (physician) and Advanced Practice Providers (APPs -  Physician Assistants and Nurse Practitioners) who all work together to provide you with the care you need, when you need it.  We recommend signing up for the patient portal called "MyChart".  Sign up information is provided on this After Visit Summary.  MyChart is used to connect with patients for Virtual Visits (Telemedicine).  Patients are able to view lab/test results, encounter notes, upcoming appointments, etc.  Non-urgent messages can be sent to your provider as well.   To learn more about what you can do with MyChart, go to [email or web address].    Your next appointment:   12 month(s)  The format for your next appointment:   In Person  Provider:   You may see Quay Burow, MD or one of the following Advanced Practice Providers on your designated Care Team:    Kerin Ransom, PA-C  [place], [place]  Coletta Memos, [place]    Other Instructions Please call our office 2 months in advance to schedule your follow-up appointment with Dr. Gwenlyn Found.

## 2019-09-21 NOTE — Assessment & Plan Note (Signed)
History of essential hypertension blood pressure measured today 142/84 although at home it is much lower than this.  She was on amlodipine which was discontinued by her PCP because of ankle edema.  She is on HydroDIURIL and losartan.

## 2019-10-01 ENCOUNTER — Other Ambulatory Visit: Payer: Self-pay | Admitting: Internal Medicine

## 2019-10-01 DIAGNOSIS — Z9861 Coronary angioplasty status: Secondary | ICD-10-CM

## 2019-10-01 DIAGNOSIS — I10 Essential (primary) hypertension: Secondary | ICD-10-CM

## 2019-10-06 ENCOUNTER — Telehealth (INDEPENDENT_AMBULATORY_CARE_PROVIDER_SITE_OTHER): Payer: PPO | Admitting: Family Medicine

## 2019-10-06 ENCOUNTER — Other Ambulatory Visit: Payer: Self-pay

## 2019-10-06 DIAGNOSIS — J3489 Other specified disorders of nose and nasal sinuses: Secondary | ICD-10-CM

## 2019-10-06 DIAGNOSIS — Z789 Other specified health status: Secondary | ICD-10-CM | POA: Diagnosis not present

## 2019-10-06 DIAGNOSIS — R0982 Postnasal drip: Secondary | ICD-10-CM | POA: Diagnosis not present

## 2019-10-06 DIAGNOSIS — R0981 Nasal congestion: Secondary | ICD-10-CM

## 2019-10-06 MED ORDER — DOXYCYCLINE HYCLATE 100 MG PO TABS
100.0000 mg | ORAL_TABLET | Freq: Two times a day (BID) | ORAL | 0 refills | Status: DC
Start: 2019-10-06 — End: 2019-10-25

## 2019-10-06 NOTE — Progress Notes (Signed)
Virtual Visit via Telephone Note  I connected with [name] on 10/06/19 at  3:20 PM EDT by telephone and verified that I am speaking with the correct person using two identifiers.   I discussed the limitations, risks, security and privacy concerns of performing an evaluation and management service by telephone and the availability of in person appointments. I also discussed with the patient that there may be a patient responsible charge related to this service. The patient expressed understanding and agreed to proceed.  Location patient: home Location provider: work or home office Participants present for the call: patient, provider Patient did not have a visit in the prior 7 days to address this/these issue(s).   History of Present Illness:  Acute visit for sinus congestion: -started over 1 week ago, now worsening -symptoms include nasal congestion, facial pain, tooth pain (saw her dentist this morning - but was thought was sinus issues), thick drainage in the throat, cough -reports has dealt with sinus issues for many years and has a history of sinus infection -denies fevers, body aches, sob, sick contacts -fully vaccinated for covid19 -has a lot of listed abx allergies and intolerances, however she wants to take something that will knock this out as feels a zpack did not really clear it out a few months ago   Observations/Objective: Patient sounds cheerful and well on the phone. I do not appreciate any SOB. Speech and thought processing are grossly intact. Patient reported vitals:  Assessment and Plan:  Sinus pain  Nasal congestion  PND (post-nasal drip)  Medication intolerance  -we discussed possible serious and likely etiologies, options for evaluation and workup, limitations of telemedicine visit vs in person visit, treatment, treatment risks and precautions. Pt prefers to treat via telemedicine empirically rather then risking or undertaking an in person visit at this  moment. Query sinusitis given sinus pain, tooth pain and thick sinus drainage worsening. Discussed her abx intolerances and she opted to try doxy again and use imodium if mild diarrhea. Agrees to stop and call PCP if is unable to tolerate. Also advised allergy regimen and nasal saline. Patient agrees to seek prompt in person care if worsening, new symptoms arise, or if is not improving with treatment.  Follow Up Instructions:   I did not refer this patient for an OV in the next 24 hours for this/these issue(s).  I discussed the assessment and treatment plan with the patient. The patient was provided an opportunity to ask questions and all were answered. The patient agreed with the plan and demonstrated an understanding of the instructions.   The patient was advised to call back or seek an in-person evaluation if the symptoms worsen or if the condition fails to improve as anticipated.  I provided 22 minutes of non-face-to-face time during this encounter.   Lucretia Kern, DO

## 2019-10-07 ENCOUNTER — Telehealth: Payer: PPO | Admitting: Internal Medicine

## 2019-10-19 ENCOUNTER — Telehealth: Payer: Self-pay | Admitting: Physician Assistant

## 2019-10-19 MED ORDER — LANSOPRAZOLE 15 MG PO CPDR: 15.0000 mg | DELAYED_RELEASE_CAPSULE | Freq: Two times a day (BID) | ORAL | 11 refills | Status: DC

## 2019-10-19 NOTE — Telephone Encounter (Signed)
Are you okay with changing her Prevacid to 15 mg?

## 2019-10-19 NOTE — Telephone Encounter (Signed)
That's fine - ok to change to 15 mg po daily #30- or 90 daily supply if insurance allows and refill x one year

## 2019-10-19 NOTE — Telephone Encounter (Signed)
Pt called requesting new prescription for Pantoprazole for 15mg  1 pill BID. Pt states that 30 mg is too strong for her stomach. She will prefer 15mg  if possible. Pls send [facility] to [facility] on [address].

## 2019-10-19 NOTE — Telephone Encounter (Signed)
Prescription has been sent to pharmacy and patient notified.

## 2019-10-24 ENCOUNTER — Telehealth: Payer: Self-pay

## 2019-10-24 MED ORDER — FLUTICASONE PROPIONATE 50 MCG/ACT NA SUSP: 1.0000 | Freq: Every day | NASAL | 3 refills | Status: DC

## 2019-10-24 NOTE — Telephone Encounter (Signed)
1.Medication Requested:fluticasone (FLONASE) 50 MCG/ACT nasal spray  2. Pharmacy (Name, Street, City):[id number] [place], [place] [address]  3. On Med List: yES   4. Last Visit with PCP: 5.24.21  5. Next visit date with PCP: N/A   Agent: Please be advised that RX refills may take up to 3 business days. We ask that you follow-up with your pharmacy.

## 2019-10-24 NOTE — Telephone Encounter (Signed)
erx has been sent.  

## 2019-10-25 ENCOUNTER — Telehealth: Payer: PPO | Admitting: Internal Medicine

## 2019-10-25 ENCOUNTER — Telehealth (INDEPENDENT_AMBULATORY_CARE_PROVIDER_SITE_OTHER): Payer: PPO | Admitting: Internal Medicine

## 2019-10-25 ENCOUNTER — Other Ambulatory Visit: Payer: Self-pay

## 2019-10-25 DIAGNOSIS — J0110 Acute frontal sinusitis, unspecified: Secondary | ICD-10-CM

## 2019-10-25 MED ORDER — CEPHALEXIN 500 MG PO CAPS
500.0000 mg | ORAL_CAPSULE | Freq: Two times a day (BID) | ORAL | 0 refills | Status: DC
Start: 2019-10-25 — End: 2019-11-02

## 2019-10-25 NOTE — Progress Notes (Signed)
Virtual Visit via Audio Note  I connected with Krystal [name] on 10/25/19 at  3:20 PM EDT by an audio-only enabled telemedicine application and verified that I am speaking with the correct person using two identifiers.  The patient and the provider were at separate locations throughout the entire encounter. Patient location: home, Provider location: work   I discussed the limitations of evaluation and management by telemedicine and the availability of in person appointments. The patient expressed understanding and agreed to proceed. The patient and the provider were the only parties present for the visit unless noted in HPI below.  History of Present Illness: The patient is a 86 y.o. female with visit for sinus congestion and headaches. Started 3-4 weeks ago. Has headaches and drainage. Denies fevers or chills. Previously vaccinated for covid-19. Denies sick contacts. Denies cough or SOB or muscle aches. Overall it is worsening. Has tried otc allergy and sinus medications without relief.  Observations/Objective: Voice hoarse, A and O times 3, no coughing or dyspnea during visit  Assessment and Plan: See problem oriented charting  Follow Up Instructions: rx keflex for sinus infection  Visit time 12 minutes in non-face to face communication with patient and coordination of care.  I discussed the assessment and treatment plan with the patient. The patient was provided an opportunity to ask questions and all were answered. The patient agreed with the plan and demonstrated an understanding of the instructions.   The patient was advised to call back or seek an in-person evaluation if the symptoms worsen or if the condition fails to improve as anticipated.  [name], MD

## 2019-10-27 ENCOUNTER — Encounter: Payer: Self-pay | Admitting: Internal Medicine

## 2019-10-27 NOTE — Assessment & Plan Note (Signed)
Rx keflex as she has taken before with only diarrhea as side effect. We talked about probiotics to help manage.

## 2019-10-30 ENCOUNTER — Other Ambulatory Visit: Payer: Self-pay | Admitting: Internal Medicine

## 2019-10-30 DIAGNOSIS — I10 Essential (primary) hypertension: Secondary | ICD-10-CM

## 2019-11-01 ENCOUNTER — Telehealth: Payer: Self-pay

## 2019-11-01 NOTE — Telephone Encounter (Signed)
Pt contacted and she stated that she has completed the abx and the congestion has gone away. Pt states that she continues to have a headache that has not gone away with no relief from OTC headache medications.   Appt with Dr. Ronnald Ramp scheduled for Wednesday at 09:40am.

## 2019-11-01 NOTE — Telephone Encounter (Signed)
New message    The patient voiced the headache is not getting any better, Dr. Sharlet Salina had advised the patient to call back if symptoms are worsening.    The patient is asking for a callback on what she should do.

## 2019-11-02 ENCOUNTER — Ambulatory Visit (INDEPENDENT_AMBULATORY_CARE_PROVIDER_SITE_OTHER): Payer: PPO | Admitting: Internal Medicine

## 2019-11-02 ENCOUNTER — Other Ambulatory Visit: Payer: Self-pay

## 2019-11-02 ENCOUNTER — Encounter: Payer: Self-pay | Admitting: Internal Medicine

## 2019-11-02 VITALS — BP 144/90 | HR 66 | Temp 98.1°F | Ht 64.0 in | Wt 154.1 lb

## 2019-11-02 DIAGNOSIS — I10 Essential (primary) hypertension: Secondary | ICD-10-CM | POA: Diagnosis not present

## 2019-11-02 DIAGNOSIS — J0101 Acute recurrent maxillary sinusitis: Secondary | ICD-10-CM

## 2019-11-02 DIAGNOSIS — F411 Generalized anxiety disorder: Secondary | ICD-10-CM | POA: Diagnosis not present

## 2019-11-02 DIAGNOSIS — J301 Allergic rhinitis due to pollen: Secondary | ICD-10-CM | POA: Diagnosis not present

## 2019-11-02 MED ORDER — ALPRAZOLAM 0.5 MG PO TABS: 0.5000 mg | ORAL_TABLET | Freq: Three times a day (TID) | ORAL | 1 refills | Status: DC | PRN

## 2019-11-02 MED ORDER — HYDROCHLOROTHIAZIDE 25 MG PO TABS: 25.0000 mg | ORAL_TABLET | Freq: Every day | ORAL | 1 refills | Status: DC

## 2019-11-02 MED ORDER — MOXIFLOXACIN HCL 400 MG PO TABS: 400.0000 mg | ORAL_TABLET | Freq: Every day | ORAL | 0 refills | Status: DC

## 2019-11-02 MED ORDER — METHYLPREDNISOLONE 4 MG PO TBPK: ORAL_TABLET | ORAL | 0 refills | Status: AC

## 2019-11-02 NOTE — Patient Instructions (Signed)
Sinusitis, Adult Sinusitis is inflammation of your sinuses. Sinuses are hollow spaces in the bones around your face. Your sinuses are located:  Around your eyes.  In the middle of your forehead.  Behind your nose.  In your cheekbones. Mucus normally drains out of your sinuses. When your nasal tissues become inflamed or swollen, mucus can become trapped or blocked. This allows bacteria, viruses, and fungi to grow, which leads to infection. Most infections of the sinuses are caused by a virus. Sinusitis can develop quickly. It can last for up to 4 weeks (acute) or for more than 12 weeks (chronic). Sinusitis often develops after a cold. What are the causes? This condition is caused by anything that creates swelling in the sinuses or stops mucus from draining. This includes:  Allergies.  Asthma.  Infection from bacteria or viruses.  Deformities or blockages in your nose or sinuses.  Abnormal growths in the nose (nasal polyps).  Pollutants, such as chemicals or irritants in the air.  Infection from fungi (rare). What increases the risk? You are more likely to develop this condition if you:  Have a weak body defense system (immune system).  Do a lot of swimming or diving.  Overuse nasal sprays.  Smoke. What are the signs or symptoms? The main symptoms of this condition are pain and a feeling of pressure around the affected sinuses. Other symptoms include:  Stuffy nose or congestion.  Thick drainage from your nose.  Swelling and warmth over the affected sinuses.  Headache.  Upper toothache.  A cough that may get worse at night.  Extra mucus that collects in the throat or the back of the nose (postnasal drip).  Decreased sense of smell and taste.  Fatigue.  A fever.  Sore throat.  Bad breath. How is this diagnosed? This condition is diagnosed based on:  Your symptoms.  Your medical history.  A physical exam.  Tests to find out if your condition is  acute or chronic. This may include: ? Checking your nose for nasal polyps. ? Viewing your sinuses using a device that has a light (endoscope). ? Testing for allergies or bacteria. ? Imaging tests, such as an MRI or CT scan. In rare cases, a bone biopsy may be done to rule out more serious types of fungal sinus disease. How is this treated? Treatment for sinusitis depends on the cause and whether your condition is chronic or acute.  If caused by a virus, your symptoms should go away on their own within 10 days. You may be given medicines to relieve symptoms. They include: ? Medicines that shrink swollen nasal passages (topical intranasal decongestants). ? Medicines that treat allergies (antihistamines). ? A spray that eases inflammation of the nostrils (topical intranasal corticosteroids). ? Rinses that help get rid of thick mucus in your nose (nasal saline washes).  If caused by bacteria, your health care provider may recommend waiting to see if your symptoms improve. Most bacterial infections will get better without antibiotic medicine. You may be given antibiotics if you have: ? A severe infection. ? A weak immune system.  If caused by narrow nasal passages or nasal polyps, you may need to have surgery. Follow these instructions at home: Medicines  Take, use, or apply over-the-counter and prescription medicines only as told by your health care provider. These may include nasal sprays.  If you were prescribed an antibiotic medicine, take it as told by your health care provider. Do not stop taking the antibiotic even if you start   to feel better. Hydrate and humidify   Drink enough fluid to keep your urine pale yellow. Staying hydrated will help to thin your mucus.  Use a cool mist humidifier to keep the humidity level in your home above 50%.  Inhale steam for 10-15 minutes, 3-4 times a day, or as told by your health care provider. You can do this in the bathroom while a hot shower is  running.  Limit your exposure to cool or dry air. Rest  Rest as much as possible.  Sleep with your head raised (elevated).  Make sure you get enough sleep each night. General instructions   Apply a warm, moist washcloth to your face 3-4 times a day or as told by your health care provider. This will help with discomfort.  Wash your hands often with soap and water to reduce your exposure to germs. If soap and water are not available, use hand sanitizer.  Do not smoke. Avoid being around people who are smoking (secondhand smoke).  Keep all follow-up visits as told by your health care provider. This is important. Contact a health care provider if:  You have a fever.  Your symptoms get worse.  Your symptoms do not improve within 10 days. Get help right away if:  You have a severe headache.  You have persistent vomiting.  You have severe pain or swelling around your face or eyes.  You have vision problems.  You develop confusion.  Your neck is stiff.  You have trouble breathing. Summary  Sinusitis is soreness and inflammation of your sinuses. Sinuses are hollow spaces in the bones around your face.  This condition is caused by nasal tissues that become inflamed or swollen. The swelling traps or blocks the flow of mucus. This allows bacteria, viruses, and fungi to grow, which leads to infection.  If you were prescribed an antibiotic medicine, take it as told by your health care provider. Do not stop taking the antibiotic even if you start to feel better.  Keep all follow-up visits as told by your health care provider. This is important. This information is not intended to replace advice given to you by your health care provider. Make sure you discuss any questions you have with your health care provider. Document Revised: 08/17/2017 Document Reviewed: 08/17/2017 Elsevier Patient Education  2020 [employer]  

## 2019-11-02 NOTE — Progress Notes (Signed)
Subjective:  Patient ID: Krystal [name], female    DOB: 1929/04/17  Age: 86 y.o. MRN: [id number]  CC: Allergic Rhinitis , Sinusitis, and Hypertension  This visit occurred during the SARS-CoV-2 public health emergency.  Safety protocols were in place, including screening questions prior to the visit, additional usage of staff PPE, and extensive cleaning of exam room while observing appropriate contact time as indicated for disinfecting solutions.    HPI ELIZABET [name] presents for f/up - She complains of a 1 month history of facial pain with runny nose, postnasal drip, and chills.  She was recently prescribed Keflex but says it has not helped.  She denies headache, blurred vision, nausea, vomiting, dizziness, lightheadedness, or cough.  Outpatient Medications Prior to Visit  Medication Sig Dispense Refill   acetaminophen (TYLENOL) 500 MG tablet Take 500 mg by mouth every 4 (four) hours as needed for mild pain or headache.     ascorbic acid (VITAMIN C) 500 MG tablet Take 500 mg by mouth daily.     azelastine (ASTELIN) 0.1 % nasal spray Place 2 sprays into both nostrils 2 (two) times daily. Use in each nostril as directed 90 mL 1   calcium carbonate (OS-CAL) 600 MG TABS tablet Take 600 mg by mouth 2 (two) times daily with a meal.     cholecalciferol (VITAMIN D) 1000 UNITS tablet Take 2,000 Units by mouth daily.      dicyclomine (BENTYL) 10 MG capsule Take 1 capsule (10 mg total) by mouth 4 (four) times daily -  before meals and at bedtime. As needed for cramps 60 capsule 0   docusate sodium (COLACE) 100 MG capsule Take 100 mg by mouth every other day.     fluticasone (FLONASE) 50 MCG/ACT nasal spray Place 1 spray into both nostrils daily. 16 g 3   lansoprazole (PREVACID) 15 MG capsule Take 1 capsule (15 mg total) by mouth 2 (two) times daily before a meal. 60 capsule 11   losartan (COZAAR) 50 MG tablet TAKE 1 TABLET BY MOUTH EVERY DAY 90 tablet 0   metoprolol succinate (TOPROL-XL) 25  MG 24 hr tablet TAKE 1 TABLET BY MOUTH EVERY DAY 90 tablet 1   pregabalin (LYRICA) 50 MG capsule Take 1 capsule (50 mg total) by mouth at bedtime. 90 capsule 0   PREMARIN vaginal cream APPLY 1 APPLICATORFUL 2 3 TIMES A WEEK AS DIRECTED     PROAIR HFA 108 (90 Base) MCG/ACT inhaler TAKE 2 PUFFS BY MOUTH EVERY 6 HOURS AS NEEDED FOR WHEEZE OR SHORTNESS OF BREATH 8.5 Inhaler 2   Probiotic Product (ALIGN) 4 MG CAPS Take 4 mg by mouth as needed.      RESTASIS 0.05 % ophthalmic emulsion      rosuvastatin (CRESTOR) 10 MG tablet Take 1 tablet (10 mg total) by mouth daily. 90 tablet 3   SHINGRIX injection      triamcinolone (NASACORT) 55 MCG/ACT AERO nasal inhaler Place 2 sprays into the nose daily. 1 Inhaler 12   triamcinolone cream (KENALOG) 0.5 % Apply 1 application topically 3 (three) times daily. 30 g 0   hydrochlorothiazide (HYDRODIURIL) 25 MG tablet Take 0.5 tablets (12.5 mg total) by mouth daily. 45 tablet 2   LORazepam (ATIVAN) 0.5 MG tablet Take 1 tablet (0.5 mg total) by mouth at bedtime as needed for anxiety. 30 tablet 0   cephALEXin (KEFLEX) 500 MG capsule Take 1 capsule (500 mg total) by mouth 2 (two) times daily for 10 days. 20 capsule 0  levocetirizine (XYZAL) 5 MG tablet Take 1 tablet (5 mg total) by mouth every evening. 90 tablet 1   No facility-administered medications prior to visit.    ROS Review of Systems  Constitutional: Positive for chills. Negative for fatigue and fever.  HENT: Positive for congestion, postnasal drip, rhinorrhea, sinus pressure and sinus pain. Negative for facial swelling, nosebleeds and sore throat.   Eyes: Negative.   Respiratory: Negative.   Cardiovascular: Negative for chest pain, palpitations and leg swelling.  Gastrointestinal: Negative for abdominal pain, constipation, diarrhea, nausea and vomiting.  Endocrine: Negative.   Genitourinary: Negative.  Negative for difficulty urinating.  Musculoskeletal: Negative.   Skin: Negative for  rash.  Neurological: Negative.  Negative for dizziness, weakness, light-headedness and numbness.  Hematological: Negative for adenopathy. Does not bruise/bleed easily.  Psychiatric/Behavioral: Negative for sleep disturbance. The patient is nervous/anxious.        She complains of anxiety and wants to change Ativan to Xanax.    Objective:  BP (!) 144/90 (BP Location: Left Arm, Patient Position: Sitting, Cuff Size: Normal)    Pulse 66    Temp 98.1 F (36.7 C) (Oral)    Ht 5\' 4"  (1.626 m)    Wt 154 lb 2 oz (69.9 kg)    SpO2 97%    BMI 26.46 kg/m   BP Readings from Last 3 Encounters:  11/02/19 (!) 144/90  09/21/19 (!) 142/84  08/22/19 130/60    Wt Readings from Last 3 Encounters:  11/02/19 154 lb 2 oz (69.9 kg)  09/21/19 154 lb (69.9 kg)  08/22/19 154 lb 6 oz (70 kg)    Physical Exam Vitals reviewed.  Constitutional:      Appearance: Normal appearance.  HENT:     Nose: Congestion and rhinorrhea present. Rhinorrhea is purulent.     Right Nostril: No epistaxis.     Left Nostril: No epistaxis.     Right Turbinates: Not swollen or pale.     Left Turbinates: Not swollen or pale.     Right Sinus: Maxillary sinus tenderness present. No frontal sinus tenderness.     Left Sinus: Maxillary sinus tenderness present. No frontal sinus tenderness.     Mouth/Throat:     Mouth: Mucous membranes are moist.     Pharynx: No oropharyngeal exudate or posterior oropharyngeal erythema.  Eyes:     General: No scleral icterus.    Conjunctiva/sclera: Conjunctivae normal.  Cardiovascular:     Rate and Rhythm: Normal rate and regular rhythm.     Heart sounds: No murmur heard.   Pulmonary:     Effort: Pulmonary effort is normal.     Breath sounds: No stridor. No wheezing, rhonchi or rales.  Abdominal:     General: Abdomen is flat. Bowel sounds are normal. There is no distension.     Palpations: Abdomen is soft. There is no hepatomegaly, splenomegaly or mass.     Tenderness: There is no abdominal  tenderness.  Musculoskeletal:        General: Normal range of motion.     Cervical back: Neck supple.     Right lower leg: No edema.     Left lower leg: No edema.  Lymphadenopathy:     Cervical: No cervical adenopathy.  Skin:    General: Skin is warm and dry.  Neurological:     General: No focal deficit present.     Mental Status: She is alert.  Psychiatric:        Mood and Affect: Mood normal.  Behavior: Behavior normal.     Lab Results  Component Value Date   WBC 9.7 02/11/2019   HGB 14.6 02/11/2019   HCT 43.7 02/11/2019   PLT 228.0 02/11/2019   GLUCOSE 101 (H) 02/11/2019   CHOL 132 11/08/2018   TRIG 133.0 11/08/2018   HDL 44.50 11/08/2018   LDLDIRECT 160.4 12/19/2008   LDLCALC 61 11/08/2018   ALT 16 02/11/2019   AST 18 02/11/2019   NA 139 02/11/2019   K 3.7 02/11/2019   CL 101 02/11/2019   CREATININE 1.23 (H) 02/11/2019   BUN 20 02/11/2019   CO2 28 02/11/2019   TSH 1.62 11/08/2018   HGBA1C 6.0 11/08/2018    DG Shoulder Right  Result Date: 12/11/2017 CLINICAL DATA:  Lateral right shoulder pain since a fall 1 week ago. EXAM: RIGHT SHOULDER - 2+ VIEW COMPARISON:  None. FINDINGS: Diffuse osteopenia. No fracture or dislocation seen. IMPRESSION: No fracture or dislocation. Electronically Signed   By: Claudie Revering M.D.   On: 12/11/2017 11:38    Assessment & Plan:   Austin was seen today for allergic rhinitis , sinusitis and hypertension.  Diagnoses and all orders for this visit:  Acute recurrent maxillary sinusitis- She is allergic to penicillin so will treat with moxifloxacin. -     moxifloxacin (AVELOX) 400 MG tablet; Take 1 tablet (400 mg total) by mouth daily.  Essential hypertension- Her blood pressure is not adequately well controlled.  Will increase the dose of hydrochlorothiazide. -     hydrochlorothiazide (HYDRODIURIL) 25 MG tablet; Take 1 tablet (25 mg total) by mouth daily.  Seasonal allergic rhinitis due to pollen -     methylPREDNISolone  (MEDROL DOSEPAK) 4 MG TBPK tablet; TAKE AS DIRECTED  GAD (generalized anxiety disorder) -     ALPRAZolam (XANAX) 0.5 MG tablet; Take 1 tablet (0.5 mg total) by mouth 3 (three) times daily as needed for anxiety.   I have discontinued Ahlayah L. Treiber's LORazepam, levocetirizine, and cephALEXin. I have also changed her hydrochlorothiazide. Additionally, I am having her start on ALPRAZolam, methylPREDNISolone, and moxifloxacin. Lastly, I am having her maintain her Align, cholecalciferol, calcium carbonate, acetaminophen, ProAir HFA, Restasis, triamcinolone, triamcinolone cream, Premarin, Shingrix, dicyclomine, docusate sodium, ascorbic acid, pregabalin, azelastine, rosuvastatin, metoprolol succinate, lansoprazole, fluticasone, and losartan.  Meds ordered this encounter  Medications   hydrochlorothiazide (HYDRODIURIL) 25 MG tablet    Sig: Take 1 tablet (25 mg total) by mouth daily.    Dispense:  90 tablet    Refill:  1   ALPRAZolam (XANAX) 0.5 MG tablet    Sig: Take 1 tablet (0.5 mg total) by mouth 3 (three) times daily as needed for anxiety.    Dispense:  270 tablet    Refill:  1   methylPREDNISolone (MEDROL DOSEPAK) 4 MG TBPK tablet    Sig: TAKE AS DIRECTED    Dispense:  21 tablet    Refill:  0   moxifloxacin (AVELOX) 400 MG tablet    Sig: Take 1 tablet (400 mg total) by mouth daily.    Dispense:  7 tablet    Refill:  0     Follow-up: Return in about 3 weeks (around 11/23/2019).  Scarlette Calico, MD

## 2019-11-16 ENCOUNTER — Telehealth: Payer: Self-pay | Admitting: Internal Medicine

## 2019-11-16 DIAGNOSIS — Z9861 Coronary angioplasty status: Secondary | ICD-10-CM

## 2019-11-16 DIAGNOSIS — I2510 Atherosclerotic heart disease of native coronary artery without angina pectoris: Secondary | ICD-10-CM

## 2019-11-16 DIAGNOSIS — I10 Essential (primary) hypertension: Secondary | ICD-10-CM

## 2019-11-16 MED ORDER — LOSARTAN POTASSIUM 50 MG PO TABS: 50.0000 mg | ORAL_TABLET | Freq: Every day | ORAL | 1 refills | Status: DC

## 2019-11-16 MED ORDER — HYDROCHLOROTHIAZIDE 25 MG PO TABS: 25.0000 mg | ORAL_TABLET | Freq: Every day | ORAL | 1 refills | Status: DC

## 2019-11-16 MED ORDER — METOPROLOL SUCCINATE ER 25 MG PO TB24: 25.0000 mg | ORAL_TABLET | Freq: Every day | ORAL | 1 refills | Status: DC

## 2019-11-16 NOTE — Telephone Encounter (Signed)
New Message:   Pt is calling and states she also needs a refill for hydrochlorothiazide (HYDRODIURIL) 25 MG tablet and losartan (COZAAR) 50 MG tablet. Please advise.

## 2019-11-16 NOTE — Telephone Encounter (Signed)
    1.Medication Requested: metoprolol succinate (TOPROL-XL) 25 MG 24 hr tablet  2. Pharmacy (Name, Street, City):[id number] - [place], [place] [address]  3. On Med List: yes  4. Last Visit with PCP: 11/02/19  5. Next visit date with PCP: n/a   Agent: Please be advised that RX refills may take up to 3 business days. We ask that you follow-up with your pharmacy.

## 2019-11-16 NOTE — Telephone Encounter (Signed)
erx sent as requested.  

## 2019-12-06 ENCOUNTER — Other Ambulatory Visit: Payer: Self-pay | Admitting: Family

## 2019-12-12 ENCOUNTER — Telehealth: Payer: Self-pay | Admitting: Cardiovascular Disease

## 2019-12-12 NOTE — Telephone Encounter (Signed)
The patient was calling to see if we could call in her Hydrochlorothiazide 25 mg once daily for her. According to our records at her last visit she was on half a tablet at 12.5 mg. Her PCP had recently increased the dosage and sent in the prescription. She has been advised to call her PCP for a refill. She has verbalized her understanding.

## 2019-12-12 NOTE — Telephone Encounter (Signed)
New message:     Patient calling concerning some medications hydrochlorothiazide. Please call patient.

## 2019-12-12 NOTE — Telephone Encounter (Signed)
   Patient states pharmacy never received 8/18 request for hydrochlorothiazide (HYDRODIURIL) 25 MG tablet

## 2019-12-13 ENCOUNTER — Other Ambulatory Visit: Payer: Self-pay | Admitting: Internal Medicine

## 2019-12-13 DIAGNOSIS — I10 Essential (primary) hypertension: Secondary | ICD-10-CM

## 2019-12-13 MED ORDER — LOSARTAN POTASSIUM 50 MG PO TABS: 50.0000 mg | ORAL_TABLET | Freq: Every day | ORAL | 1 refills | Status: DC

## 2019-12-13 MED ORDER — HYDROCHLOROTHIAZIDE 25 MG PO TABS: 25.0000 mg | ORAL_TABLET | Freq: Every day | ORAL | 1 refills | Status: DC

## 2019-12-19 DIAGNOSIS — H524 Presbyopia: Secondary | ICD-10-CM | POA: Diagnosis not present

## 2019-12-19 DIAGNOSIS — H26492 Other secondary cataract, left eye: Secondary | ICD-10-CM | POA: Diagnosis not present

## 2019-12-19 DIAGNOSIS — H43813 Vitreous degeneration, bilateral: Secondary | ICD-10-CM | POA: Diagnosis not present

## 2019-12-19 DIAGNOSIS — H04123 Dry eye syndrome of bilateral lacrimal glands: Secondary | ICD-10-CM | POA: Diagnosis not present

## 2019-12-20 ENCOUNTER — Other Ambulatory Visit: Payer: Self-pay

## 2019-12-20 ENCOUNTER — Ambulatory Visit (INDEPENDENT_AMBULATORY_CARE_PROVIDER_SITE_OTHER): Payer: PPO | Admitting: Family

## 2019-12-20 ENCOUNTER — Encounter: Payer: Self-pay | Admitting: Family

## 2019-12-20 VITALS — BP 138/74 | HR 82 | Temp 98.0°F | Ht 64.0 in | Wt 153.0 lb

## 2019-12-20 DIAGNOSIS — J301 Allergic rhinitis due to pollen: Secondary | ICD-10-CM | POA: Diagnosis not present

## 2019-12-20 DIAGNOSIS — M8949 Other hypertrophic osteoarthropathy, multiple sites: Secondary | ICD-10-CM | POA: Diagnosis not present

## 2019-12-20 DIAGNOSIS — M150 Primary generalized (osteo)arthritis: Secondary | ICD-10-CM

## 2019-12-20 DIAGNOSIS — M159 Polyosteoarthritis, unspecified: Secondary | ICD-10-CM

## 2019-12-20 MED ORDER — LORATADINE 10 MG PO TABS: 10.0000 mg | ORAL_TABLET | Freq: Every day | ORAL | 3 refills | Status: DC

## 2019-12-20 MED ORDER — ALBUTEROL SULFATE HFA 108 (90 BASE) MCG/ACT IN AERS: INHALATION_SPRAY | RESPIRATORY_TRACT | 1 refills | Status: DC

## 2019-12-20 MED ORDER — MELOXICAM 7.5 MG PO TABS: 7.5000 mg | ORAL_TABLET | Freq: Every day | ORAL | 0 refills | Status: DC

## 2019-12-20 NOTE — Progress Notes (Signed)
Krystal [name] is a 86 y.o. female with the following history as recorded in EpicCare:  Patient Active Problem List   Diagnosis Date Noted  . Restless legs syndrome (RLS) 08/22/2019  . Chronic peripheral neuropathic pain 08/22/2019  . Nausea 01/18/2019  . Chronic renal disease, stage 3, moderately decreased glomerular filtration rate (GFR) between 30-59 mL/min/1.73 square meter 11/08/2018  . Chronic idiopathic constipation 10/19/2017  . Acute sinusitis 05/20/2017  . LBBB (left bundle branch block) 07/16/2016  . Cardiomyopathy ([place]) 07/16/2016  . Benign paroxysmal positional vertigo 01/17/2016  . Nocturnal foot cramps 01/17/2016  . Hyperglycemia 05/23/2015  . IBS (irritable bowel syndrome) 10/13/2011  . GERD (gastroesophageal reflux disease) 06/25/2010  . RENAL CYST 11/18/2007  . OSTEOPOROSIS 11/18/2007  . Allergic rhinitis 10/06/2007  . Hyperlipidemia with target LDL less than 70 07/19/2007  . GAD (generalized anxiety disorder) 07/19/2007  . Essential hypertension 07/19/2007  . CAD S/P percutaneous coronary angioplasty 07/19/2007  . VENOUS INSUFFICIENCY 07/19/2007  . Osteoarthritis 07/19/2007  . LOW BACK PAIN SYNDROME 07/19/2007    Current Outpatient Medications  Medication Sig Dispense Refill  . acetaminophen (TYLENOL) 500 MG tablet Take 500 mg by mouth every 4 (four) hours as needed for mild pain or headache.    . albuterol (PROAIR HFA) 108 (90 Base) MCG/ACT inhaler TAKE 2 PUFFS BY MOUTH EVERY 6 HOURS AS NEEDED FOR WHEEZE OR SHORTNESS OF BREATH 8.5 g 1  . ALPRAZolam (XANAX) 0.5 MG tablet Take 1 tablet (0.5 mg total) by mouth 3 (three) times daily as needed for anxiety. 270 tablet 1  . ascorbic acid (VITAMIN C) 500 MG tablet Take 500 mg by mouth daily.    Marland Kitchen azelastine (ASTELIN) 0.1 % nasal spray Place 2 sprays into both nostrils 2 (two) times daily. Use in each nostril as directed 90 mL 1  . calcium carbonate (OS-CAL) 600 MG TABS tablet Take 600 mg by mouth 2 (two) times daily  with a meal.    . cholecalciferol (VITAMIN D) 1000 UNITS tablet Take 2,000 Units by mouth daily.     Marland Kitchen dicyclomine (BENTYL) 10 MG capsule Take 1 capsule (10 mg total) by mouth 4 (four) times daily -  before meals and at bedtime. As needed for cramps 60 capsule 0  . docusate sodium (COLACE) 100 MG capsule Take 100 mg by mouth every other day.    . fluticasone (FLONASE) 50 MCG/ACT nasal spray Place 1 spray into both nostrils daily. 16 g 3  . hydrochlorothiazide (HYDRODIURIL) 25 MG tablet Take 1 tablet (25 mg total) by mouth daily. 90 tablet 1  . lansoprazole (PREVACID) 15 MG capsule Take 1 capsule (15 mg total) by mouth 2 (two) times daily before a meal. 60 capsule 11  . losartan (COZAAR) 50 MG tablet Take 1 tablet (50 mg total) by mouth daily. 90 tablet 1  . meloxicam (MOBIC) 7.5 MG tablet Take 1 tablet (7.5 mg total) by mouth daily. 90 tablet 0  . metoprolol succinate (TOPROL-XL) 25 MG 24 hr tablet Take 1 tablet (25 mg total) by mouth daily. 90 tablet 1  . pregabalin (LYRICA) 50 MG capsule Take 1 capsule (50 mg total) by mouth at bedtime. 90 capsule 0  . PREMARIN vaginal cream APPLY 1 APPLICATORFUL 2 3 TIMES A WEEK AS DIRECTED    . Probiotic Product (ALIGN) 4 MG CAPS Take 4 mg by mouth as needed.     . RESTASIS 0.05 % ophthalmic emulsion     . rosuvastatin (CRESTOR) 10 MG tablet Take  1 tablet (10 mg total) by mouth daily. 90 tablet 3  . SHINGRIX injection     . triamcinolone cream (KENALOG) 0.5 % Apply 1 application topically 3 (three) times daily. 30 g 0  . loratadine (CLARITIN) 10 MG tablet Take 1 tablet (10 mg total) by mouth daily. 90 tablet 3   No current facility-administered medications for this visit.    Allergies: Atorvastatin, Simvastatin, Alendronate sodium, Cefdinir, Doxycycline, Esomeprazole magnesium, Levofloxacin, Omeprazole, Other, Penicillins, Amlodipine, and Shellfish-derived products  Past Medical History:  Diagnosis Date  . Adenomatous colon polyp   . Allergy    seasonal   . Anemia    pt. denies  . Anxiety   . CAD (coronary artery disease)   . Cataract    cataracts removed left eye.  . Chronic lower back pain   . Diverticulosis   . GERD (gastroesophageal reflux disease)   . Headache(784.0)   . Hiatal hernia   . Hyperlipidemia   . Hypertension   . IBS (irritable bowel syndrome) 10/13/2011  . Osteoarthritis   . Osteoporosis   . Renal cysts, acquired, bilateral   . SBO (small bowel obstruction) ([place]) 09/05/2015  . Venous insufficiency     Past Surgical History:  Procedure Laterality Date  . ABDOMINAL HYSTERECTOMY    . BLADDER REPAIR    . CARDIAC CATHETERIZATION  01/12/2002   90% stenosis of the left circumflex, stented with a 3x57mm Cypher stent, resulting in reduction of 90% to 10%   . CARDIOVASCULAR STRESS TEST  03/22/2007   Mild inferolateral thinning toward the apex without significant ischemia. Nondiagnostic electrocardiogram.  . CATARACT EXTRACTION     bilateral  . COLONOSCOPY  08/17/2008   adenomatous polyp, diverticulosis, external hemorrhoids  . COLONOSCOPY W/ BIOPSIES     multiple   . LEFT LOWER EXTREMITY VENOUS DOPPLER  06/18/2011   No evidence of left lower extremity DVT  . TRANSTHORACIC ECHOCARDIOGRAM  11/18/2012   EF 16-10%, LV systolic mild-moderately reduced, mild-moderate mitral valve regurg, mild-moderate tricuspid valve regurg. NSR-LBBB with occas PVCs  . UPPER GASTROINTESTINAL ENDOSCOPY  07/03/2010   hiatal hernia  . VARICOSE VEIN SURGERY      Family History  Problem Relation Age of Onset  . Heart disease Father   . Stroke Sister   . Colon cancer Neg Hx     Social History   Tobacco Use  . Smoking status: Never Smoker  . Smokeless tobacco: Never Used  Substance Use Topics  . Alcohol use: No    Alcohol/week: 0.0 standard drinks    Subjective:  Requesting refill on Mobic today; uses as needed to help with arthritis pain; notes that one pill may last for 3 days at a time; does not upset her stomach;  Struggling with  fall allergies as well- only taking Astelin and Flonase; felt that Xyzal caused her to be too sleepy; occasional wheezing- does not have up to date albuterol inhaler;   Objective:  Vitals:   12/20/19 1111  BP: 138/74  Pulse: 82  Temp: 98 F (36.7 C)  TempSrc: Oral  SpO2: 97%  Weight: 153 lb (69.4 kg)  Height: 5\' 4"  (1.626 m)    General: Well developed, well nourished, in no acute distress  Head: Normocephalic and atraumatic  Ears: External normal; canals clear; tympanic membranes normal; ear wax noted bilaterally Oropharynx: Pink, supple. No suspicious lesions  Neck: Supple without thyromegaly, adenopathy  Lungs: Respirations unlabored; clear to auscultation bilaterally without wheeze, rales, rhonchi  Musculoskeletal: No deformities; no active  joint inflammation  Extremities: No edema, cyanosis, clubbing  Vessels: Symmetric bilaterally  Neurologic: Alert and oriented; speech intact; face symmetrical; moves all extremities well; CNII-XII intact without focal deficit   Assessment:  1. Seasonal allergic rhinitis due to pollen   2. Non-seasonal allergic rhinitis due to pollen   3. Primary osteoarthritis involving multiple joints     Plan:  1. Try adding Claritin 10 mg daily; refill on albuterol inhaler; continue Flonase and Astelin; 3. Rx for Mobic 7.5 mg daily;  She will get her COVID booster at her pharmacy and plan for flu shot here later in October;  This visit occurred during the SARS-CoV-2 public health emergency.  Safety protocols were in place, including screening questions prior to the visit, additional usage of staff PPE, and extensive cleaning of exam room while observing appropriate contact time as indicated for disinfecting solutions.     No follow-ups on file.  No orders of the defined types were placed in this encounter.   Requested Prescriptions   Signed Prescriptions Disp Refills  . albuterol (PROAIR HFA) 108 (90 Base) MCG/ACT inhaler 8.5 g 1    Sig: TAKE 2  PUFFS BY MOUTH EVERY 6 HOURS AS NEEDED FOR WHEEZE OR SHORTNESS OF BREATH  . meloxicam (MOBIC) 7.5 MG tablet 90 tablet 0    Sig: Take 1 tablet (7.5 mg total) by mouth daily.  Marland Kitchen loratadine (CLARITIN) 10 MG tablet 90 tablet 3    Sig: Take 1 tablet (10 mg total) by mouth daily.

## 2019-12-23 DIAGNOSIS — Z1231 Encounter for screening mammogram for malignant neoplasm of breast: Secondary | ICD-10-CM | POA: Diagnosis not present

## 2019-12-23 DIAGNOSIS — Z01419 Encounter for gynecological examination (general) (routine) without abnormal findings: Secondary | ICD-10-CM | POA: Diagnosis not present

## 2020-01-13 ENCOUNTER — Ambulatory Visit (INDEPENDENT_AMBULATORY_CARE_PROVIDER_SITE_OTHER): Payer: PPO | Admitting: *Deleted

## 2020-01-13 ENCOUNTER — Other Ambulatory Visit: Payer: Self-pay

## 2020-01-13 DIAGNOSIS — Z23 Encounter for immunization: Secondary | ICD-10-CM | POA: Diagnosis not present

## 2020-01-16 ENCOUNTER — Other Ambulatory Visit: Payer: Self-pay | Admitting: Internal Medicine

## 2020-02-03 ENCOUNTER — Ambulatory Visit: Payer: PPO | Admitting: Surgical

## 2020-02-03 ENCOUNTER — Encounter: Payer: Self-pay | Admitting: Surgical

## 2020-02-03 DIAGNOSIS — G629 Polyneuropathy, unspecified: Secondary | ICD-10-CM | POA: Diagnosis not present

## 2020-02-03 MED ORDER — GABAPENTIN 100 MG PO CAPS: 100.0000 mg | ORAL_CAPSULE | Freq: Every day | ORAL | 2 refills | Status: DC

## 2020-02-03 NOTE — Progress Notes (Signed)
Office Visit Note   Patient: Krystal [name]           Date of Birth: [birth year]           MRN: [id number] Visit Date: 02/03/2020 Requested by: Janith Lima, MD [place],  [place] [id number] PCP: Janith Lima, MD  Subjective: Chief Complaint  Patient presents with  . Left Foot - Numbness  . Right Foot - Numbness    HPI: Krystal [name] is a 86 y.o. female who presents to the office complaining of bilateral foot and ankle burning. Patient states that when she goes to sleep at night she wakes up every night with bilateral feet and ankle burning/numbness/tingling. She states that the symptoms do not bother her during the day. She gets up at night when she has the symptoms and walks for about 10 minutes and this helps resolve her symptoms. She denies any symptoms during the day or any symptoms that worsen with walking. She denies any low back pain or radicular pain down her leg. Denies any history of diabetes or alcoholism. She did discuss this problem with her primary care physician who prescribed pregabalin but this provided no relief and caused her to feel dizzy when she was walking at night. She has seen Dr. Marlou Sa in 2019 for a similar problem and had excellent relief with gabapentin and no associated dizziness with this medication..                ROS: All systems reviewed are negative as they relate to the chief complaint within the history of present illness.  Patient denies fevers or chills.  Assessment & Plan: Visit Diagnoses:  1. Neuropathy     Plan: Patient is an 86 year old female presents complaint of bilateral foot and ankle pain. By her description, she has numbness and tingling and burning pain that extends from her toes up to about mid calf. Impression is neuropathic pain. She has had good relief with gabapentin in the past. Plan to try gabapentin 100 mg nightly. Do not think that this this is coming from her back or associated with any claudication. Follow-up  with Dr. Marlou Sa in 6 weeks. She may cancel the follow-up visit if she feels her symptoms are controlled. Patient agreed with plan.  Follow-Up Instructions: No follow-ups on file.   Orders:  No orders of the defined types were placed in this encounter.  No orders of the defined types were placed in this encounter.     Procedures: No procedures performed   Clinical Data: No additional findings.  Objective: Vital Signs: There were no vitals taken for this visit.  Physical Exam:  Constitutional: Patient appears well-developed HEENT:  Head: Normocephalic Eyes:EOM are normal Neck: Normal range of motion Cardiovascular: Normal rate Pulmonary/chest: Effort normal Neurologic: Patient is alert Skin: Skin is warm Psychiatric: Patient has normal mood and affect  Ortho Exam: Ortho exam demonstrates normal sensation throughout all dermatomes of the bilateral lower extremities. Excellent strength of dorsiflexion and plantarflexion bilaterally. No tenderness throughout the bilateral foot or ankle. Syndesmosis stable bilaterally. No calf tenderness. Negative Homans' sign. Negative straight leg raise. Palpable pulses bilaterally  Specialty Comments:  No specialty comments available.  Imaging: No results found.   PMFS History: Patient Active Problem List   Diagnosis Date Noted  . Restless legs syndrome (RLS) 08/22/2019  . Chronic peripheral neuropathic pain 08/22/2019  . Nausea 01/18/2019  . Chronic renal disease, stage 3, moderately decreased glomerular filtration rate (GFR)  between 30-59 mL/min/1.73 square meter ([place]) 11/08/2018  . Chronic idiopathic constipation 10/19/2017  . Acute sinusitis 05/20/2017  . LBBB (left bundle branch block) 07/16/2016  . Cardiomyopathy ([place]) 07/16/2016  . Benign paroxysmal positional vertigo 01/17/2016  . Nocturnal foot cramps 01/17/2016  . Hyperglycemia 05/23/2015  . IBS (irritable bowel syndrome) 10/13/2011  . GERD (gastroesophageal reflux  disease) 06/25/2010  . RENAL CYST 11/18/2007  . OSTEOPOROSIS 11/18/2007  . Allergic rhinitis 10/06/2007  . Hyperlipidemia with target LDL less than 70 07/19/2007  . GAD (generalized anxiety disorder) 07/19/2007  . Essential hypertension 07/19/2007  . CAD S/P percutaneous coronary angioplasty 07/19/2007  . VENOUS INSUFFICIENCY 07/19/2007  . Osteoarthritis 07/19/2007  . LOW BACK PAIN SYNDROME 07/19/2007   Past Medical History:  Diagnosis Date  . Adenomatous colon polyp   . Allergy    seasonal  . Anemia    pt. denies  . Anxiety   . CAD (coronary artery disease)   . Cataract    cataracts removed left eye.  . Chronic lower back pain   . Diverticulosis   . GERD (gastroesophageal reflux disease)   . Headache(784.0)   . Hiatal hernia   . Hyperlipidemia   . Hypertension   . IBS (irritable bowel syndrome) 10/13/2011  . Osteoarthritis   . Osteoporosis   . Renal cysts, acquired, bilateral   . SBO (small bowel obstruction) ([place]) 09/05/2015  . Venous insufficiency     Family History  Problem Relation Age of Onset  . Heart disease Father   . Stroke Sister   . Colon cancer Neg Hx     Past Surgical History:  Procedure Laterality Date  . ABDOMINAL HYSTERECTOMY    . BLADDER REPAIR    . CARDIAC CATHETERIZATION  01/12/2002   90% stenosis of the left circumflex, stented with a 3x4mm Cypher stent, resulting in reduction of 90% to 10%   . CARDIOVASCULAR STRESS TEST  03/22/2007   Mild inferolateral thinning toward the apex without significant ischemia. Nondiagnostic electrocardiogram.  . CATARACT EXTRACTION     bilateral  . COLONOSCOPY  08/17/2008   adenomatous polyp, diverticulosis, external hemorrhoids  . COLONOSCOPY W/ BIOPSIES     multiple   . LEFT LOWER EXTREMITY VENOUS DOPPLER  06/18/2011   No evidence of left lower extremity DVT  . TRANSTHORACIC ECHOCARDIOGRAM  11/18/2012   EF 86-38%, LV systolic mild-moderately reduced, mild-moderate mitral valve regurg, mild-moderate tricuspid  valve regurg. NSR-LBBB with occas PVCs  . UPPER GASTROINTESTINAL ENDOSCOPY  07/03/2010   hiatal hernia  . VARICOSE VEIN SURGERY     Social History   Occupational History  . Occupation: retired  Tobacco Use  . Smoking status: Never Smoker  . Smokeless tobacco: Never Used  Vaping Use  . Vaping Use: Never used  Substance and Sexual Activity  . Alcohol use: No    Alcohol/week: 0.0 standard drinks  . Drug use: No  . Sexual activity: Not on file

## 2020-04-23 ENCOUNTER — Other Ambulatory Visit: Payer: Self-pay

## 2020-04-23 ENCOUNTER — Telehealth (INDEPENDENT_AMBULATORY_CARE_PROVIDER_SITE_OTHER): Payer: PPO | Admitting: Family

## 2020-04-23 DIAGNOSIS — J019 Acute sinusitis, unspecified: Secondary | ICD-10-CM

## 2020-04-23 MED ORDER — PREDNISONE 20 MG PO TABS
20.0000 mg | ORAL_TABLET | Freq: Every day | ORAL | 0 refills | Status: DC
Start: 2020-04-23 — End: 2020-05-08

## 2020-04-23 MED ORDER — AZITHROMYCIN 250 MG PO TABS: ORAL_TABLET | ORAL | 0 refills | Status: DC

## 2020-04-23 NOTE — Progress Notes (Signed)
Krystal [name] is a 87 y.o. female with the following history as recorded in EpicCare:  Patient Active Problem List   Diagnosis Date Noted  . Restless legs syndrome (RLS) 08/22/2019  . Chronic peripheral neuropathic pain 08/22/2019  . Nausea 01/18/2019  . Chronic renal disease, stage 3, moderately decreased glomerular filtration rate (GFR) between 30-59 mL/min/1.73 square meter ([place]) 11/08/2018  . Chronic idiopathic constipation 10/19/2017  . Acute sinusitis 05/20/2017  . LBBB (left bundle branch block) 07/16/2016  . Cardiomyopathy ([place]) 07/16/2016  . Benign paroxysmal positional vertigo 01/17/2016  . Nocturnal foot cramps 01/17/2016  . Hyperglycemia 05/23/2015  . IBS (irritable bowel syndrome) 10/13/2011  . GERD (gastroesophageal reflux disease) 06/25/2010  . RENAL CYST 11/18/2007  . OSTEOPOROSIS 11/18/2007  . Allergic rhinitis 10/06/2007  . Hyperlipidemia with target LDL less than 70 07/19/2007  . GAD (generalized anxiety disorder) 07/19/2007  . Essential hypertension 07/19/2007  . CAD S/P percutaneous coronary angioplasty 07/19/2007  . VENOUS INSUFFICIENCY 07/19/2007  . Osteoarthritis 07/19/2007  . LOW BACK PAIN SYNDROME 07/19/2007    Current Outpatient Medications  Medication Sig Dispense Refill  . acetaminophen (TYLENOL) 500 MG tablet Take 500 mg by mouth every 4 (four) hours as needed for mild pain or headache.    . albuterol (PROAIR HFA) 108 (90 Base) MCG/ACT inhaler TAKE 2 PUFFS BY MOUTH EVERY 6 HOURS AS NEEDED FOR WHEEZE OR SHORTNESS OF BREATH 8.5 g 1  . ALPRAZolam (XANAX) 0.5 MG tablet Take 1 tablet (0.5 mg total) by mouth 3 (three) times daily as needed for anxiety. 270 tablet 1  . ascorbic acid (VITAMIN C) 500 MG tablet Take 500 mg by mouth daily.    Marland Kitchen azelastine (ASTELIN) 0.1 % nasal spray Place 2 sprays into both nostrils 2 (two) times daily. Use in each nostril as directed 90 mL 1  . calcium carbonate (OS-CAL) 600 MG TABS tablet Take 600 mg by mouth 2 (two) times  daily with a meal.    . cholecalciferol (VITAMIN D) 1000 UNITS tablet Take 2,000 Units by mouth daily.     Marland Kitchen dicyclomine (BENTYL) 10 MG capsule Take 1 capsule (10 mg total) by mouth 4 (four) times daily -  before meals and at bedtime. As needed for cramps 60 capsule 0  . docusate sodium (COLACE) 100 MG capsule Take 100 mg by mouth every other day.    . fluticasone (FLONASE) 50 MCG/ACT nasal spray PLACE 1 SPRAY INTO BOTH NOSTRILS DAILY. 48 mL 1  . gabapentin (NEURONTIN) 100 MG capsule Take 1 capsule (100 mg total) by mouth at bedtime. 30 capsule 2  . hydrochlorothiazide (HYDRODIURIL) 25 MG tablet Take 1 tablet (25 mg total) by mouth daily. 90 tablet 1  . lansoprazole (PREVACID) 15 MG capsule Take 1 capsule (15 mg total) by mouth 2 (two) times daily before a meal. 60 capsule 11  . loratadine (CLARITIN) 10 MG tablet Take 1 tablet (10 mg total) by mouth daily. 90 tablet 3  . losartan (COZAAR) 50 MG tablet Take 1 tablet (50 mg total) by mouth daily. 90 tablet 1  . meloxicam (MOBIC) 7.5 MG tablet Take 1 tablet (7.5 mg total) by mouth daily. 90 tablet 0  . metoprolol succinate (TOPROL-XL) 25 MG 24 hr tablet Take 1 tablet (25 mg total) by mouth daily. 90 tablet 1  . PREMARIN vaginal cream APPLY 1 APPLICATORFUL 2 3 TIMES A WEEK AS DIRECTED    . Probiotic Product (ALIGN) 4 MG CAPS Take 4 mg by mouth as needed.     Marland Kitchen  RESTASIS 0.05 % ophthalmic emulsion     . rosuvastatin (CRESTOR) 10 MG tablet Take 1 tablet (10 mg total) by mouth daily. 90 tablet 3  . SHINGRIX injection     . triamcinolone cream (KENALOG) 0.5 % Apply 1 application topically 3 (three) times daily. 30 g 0   No current facility-administered medications for this visit.    Allergies: Atorvastatin, Simvastatin, Alendronate sodium, Cefdinir, Doxycycline, Esomeprazole magnesium, Levofloxacin, Omeprazole, Other, Penicillins, Amlodipine, and Shellfish-derived products  Past Medical History:  Diagnosis Date  . Adenomatous colon polyp   . Allergy     seasonal  . Anemia    pt. denies  . Anxiety   . CAD (coronary artery disease)   . Cataract    cataracts removed left eye.  . Chronic lower back pain   . Diverticulosis   . GERD (gastroesophageal reflux disease)   . Headache(784.0)   . Hiatal hernia   . Hyperlipidemia   . Hypertension   . IBS (irritable bowel syndrome) 10/13/2011  . Osteoarthritis   . Osteoporosis   . Renal cysts, acquired, bilateral   . SBO (small bowel obstruction) ([place]) 09/05/2015  . Venous insufficiency     Past Surgical History:  Procedure Laterality Date  . ABDOMINAL HYSTERECTOMY    . BLADDER REPAIR    . CARDIAC CATHETERIZATION  01/12/2002   90% stenosis of the left circumflex, stented with a 3x49mm Cypher stent, resulting in reduction of 90% to 10%   . CARDIOVASCULAR STRESS TEST  03/22/2007   Mild inferolateral thinning toward the apex without significant ischemia. Nondiagnostic electrocardiogram.  . CATARACT EXTRACTION     bilateral  . COLONOSCOPY  08/17/2008   adenomatous polyp, diverticulosis, external hemorrhoids  . COLONOSCOPY W/ BIOPSIES     multiple   . LEFT LOWER EXTREMITY VENOUS DOPPLER  06/18/2011   No evidence of left lower extremity DVT  . TRANSTHORACIC ECHOCARDIOGRAM  11/18/2012   EF 96-22%, LV systolic mild-moderately reduced, mild-moderate mitral valve regurg, mild-moderate tricuspid valve regurg. NSR-LBBB with occas PVCs  . UPPER GASTROINTESTINAL ENDOSCOPY  07/03/2010   hiatal hernia  . VARICOSE VEIN SURGERY      Family History  Problem Relation Age of Onset  . Heart disease Father   . Stroke Sister   . Colon cancer Neg Hx     Social History   Tobacco Use  . Smoking status: Never Smoker  . Smokeless tobacco: Never Used  Substance Use Topics  . Alcohol use: No    Alcohol/week: 0.0 standard drinks    Subjective:   I connected with [name] on 04/23/20 at 11:20 AM EST by a telephone call and verified that I am speaking with the correct person using two identifiers.   I  discussed the limitations of evaluation and management by telemedicine and the availability of in person appointments. The patient expressed understanding and agreed to proceed. Provider in office/ patient is at home; provider and patient are only 2 people on telephone call.   Concerned for sinus infection; symptoms x 1 week; no concerns for COVID; classing sinus symptoms/ gets these frequently; + facial pressure/ feels pressure behind her eye; is taking her allergy medication;     Objective:  There were no vitals filed for this visit.  Lungs: Respirations unlabored;  Neurologic: Alert and oriented; speech intact;   Assessment:  1. Acute sinusitis, recurrence not specified, unspecified location     Plan:  Rx for Z-pak and prednisone which have been beneficial in the past; continue  allergy medications; increase fluids, rest;  Plan to see her PCP in office on 2/8 for yearly labs.  Time spent 10 minutes  No follow-ups on file.  No orders of the defined types were placed in this encounter.   Requested Prescriptions    No prescriptions requested or ordered in this encounter

## 2020-05-03 ENCOUNTER — Other Ambulatory Visit: Payer: Self-pay

## 2020-05-03 ENCOUNTER — Ambulatory Visit: Payer: PPO | Admitting: Orthopedic Surgery

## 2020-05-03 DIAGNOSIS — G629 Polyneuropathy, unspecified: Secondary | ICD-10-CM | POA: Diagnosis not present

## 2020-05-03 MED ORDER — GABAPENTIN 100 MG PO CAPS: ORAL_CAPSULE | ORAL | 4 refills | Status: DC

## 2020-05-04 ENCOUNTER — Encounter: Payer: Self-pay | Admitting: Orthopedic Surgery

## 2020-05-04 ENCOUNTER — Ambulatory Visit: Payer: PPO | Admitting: Surgical

## 2020-05-04 NOTE — Progress Notes (Signed)
Office Visit Note   Patient: Krystal [name]           Date of Birth: [birth year]           MRN: [id number] Visit Date: 05/03/2020 Requested by: Janith Lima, MD [place],  [place] [address] PCP: Janith Lima, MD  Subjective: Chief Complaint  Patient presents with  . Right Leg - Pain  . Left Leg - Pain    HPI: Krystal [name] is an 87 year old patient with bilateral lower extremity burning pain and aching. Mostly happens at night. Describes numbness and tingling as well. Denies any low back pain or weakness in the legs. Has a prior diagnosis of neuropathy and she takes Neurontin at night only 100 mg which helps her to sleep through most of but not all of the night.              ROS: All systems reviewed are negative as they relate to the chief complaint within the history of present illness.  Patient denies  fevers or chills.   Assessment & Plan: Visit Diagnoses:  1. Neuropathy     Plan: Impression is slightly worsening neuropathy with good motor sensory function in both feet as well as intact pedal pulses. This doesn't really sound like neurogenic or vascular claudication. Neuropathy is most likely. I think it might be reasonable to try 100 mg of Neurontin after dinner and 100 mg at night. Also talked about anecdotal evidence for B complex vitamins and vitamin C. She already takes vitamin C. She is can try taking a little bit more Neurontin to see if she tolerates that. We will see her back as needed.  Follow-Up Instructions: Return if symptoms worsen or fail to improve.   Orders:  No orders of the defined types were placed in this encounter.  Meds ordered this encounter  Medications  . gabapentin (NEURONTIN) 100 MG capsule    Sig: 1 po q hs    Dispense:  60 capsule    Refill:  4      Procedures: No procedures performed   Clinical Data: No additional findings.  Objective: Vital Signs: There were no vitals taken for this visit.  Physical Exam:    Constitutional: Patient appears well-developed HEENT:  Head: Normocephalic Eyes:EOM are normal Neck: Normal range of motion Cardiovascular: Normal rate Pulmonary/chest: Effort normal Neurologic: Patient is alert Skin: Skin is warm Psychiatric: Patient has normal mood and affect    Ortho Exam: Ortho exam demonstrates palpable pedal pulses with 5 out of 5 ankle dorsiflexion plantarflexion quad hamstring strength. No hip flexor weakness. No groin pain with internal ex rotation of the leg. Does have some global paresthesias in both feet extending up mid calf region. Knees have no effusion and good range of motion. No venous stasis in the legs but she does have some very mild superficial varicose veins bilaterally.  Specialty Comments:  No specialty comments available.  Imaging: No results found.   PMFS History: Patient Active Problem List   Diagnosis Date Noted  . Restless legs syndrome (RLS) 08/22/2019  . Chronic peripheral neuropathic pain 08/22/2019  . Nausea 01/18/2019  . Chronic renal disease, stage 3, moderately decreased glomerular filtration rate (GFR) between 30-59 mL/min/1.73 square meter ([place]) 11/08/2018  . Chronic idiopathic constipation 10/19/2017  . Acute sinusitis 05/20/2017  . LBBB (left bundle branch block) 07/16/2016  . Cardiomyopathy ([place]) 07/16/2016  . Benign paroxysmal positional vertigo 01/17/2016  . Nocturnal foot cramps 01/17/2016  . Hyperglycemia  05/23/2015  . IBS (irritable bowel syndrome) 10/13/2011  . GERD (gastroesophageal reflux disease) 06/25/2010  . RENAL CYST 11/18/2007  . OSTEOPOROSIS 11/18/2007  . Allergic rhinitis 10/06/2007  . Hyperlipidemia with target LDL less than 70 07/19/2007  . GAD (generalized anxiety disorder) 07/19/2007  . Essential hypertension 07/19/2007  . CAD S/P percutaneous coronary angioplasty 07/19/2007  . VENOUS INSUFFICIENCY 07/19/2007  . Osteoarthritis 07/19/2007  . LOW BACK PAIN SYNDROME 07/19/2007   Past  Medical History:  Diagnosis Date  . Adenomatous colon polyp   . Allergy    seasonal  . Anemia    pt. denies  . Anxiety   . CAD (coronary artery disease)   . Cataract    cataracts removed left eye.  . Chronic lower back pain   . Diverticulosis   . GERD (gastroesophageal reflux disease)   . Headache(784.0)   . Hiatal hernia   . Hyperlipidemia   . Hypertension   . IBS (irritable bowel syndrome) 10/13/2011  . Osteoarthritis   . Osteoporosis   . Renal cysts, acquired, bilateral   . SBO (small bowel obstruction) ([place]) 09/05/2015  . Venous insufficiency     Family History  Problem Relation Age of Onset  . Heart disease Father   . Stroke Sister   . Colon cancer Neg Hx     Past Surgical History:  Procedure Laterality Date  . ABDOMINAL HYSTERECTOMY    . BLADDER REPAIR    . CARDIAC CATHETERIZATION  01/12/2002   90% stenosis of the left circumflex, stented with a 3x26mm Cypher stent, resulting in reduction of 90% to 10%   . CARDIOVASCULAR STRESS TEST  03/22/2007   Mild inferolateral thinning toward the apex without significant ischemia. Nondiagnostic electrocardiogram.  . CATARACT EXTRACTION     bilateral  . COLONOSCOPY  08/17/2008   adenomatous polyp, diverticulosis, external hemorrhoids  . COLONOSCOPY W/ BIOPSIES     multiple   . LEFT LOWER EXTREMITY VENOUS DOPPLER  06/18/2011   No evidence of left lower extremity DVT  . TRANSTHORACIC ECHOCARDIOGRAM  11/18/2012   EF 59-16%, LV systolic mild-moderately reduced, mild-moderate mitral valve regurg, mild-moderate tricuspid valve regurg. NSR-LBBB with occas PVCs  . UPPER GASTROINTESTINAL ENDOSCOPY  07/03/2010   hiatal hernia  . VARICOSE VEIN SURGERY     Social History   Occupational History  . Occupation: retired  Tobacco Use  . Smoking status: Never Smoker  . Smokeless tobacco: Never Used  Vaping Use  . Vaping Use: Never used  Substance and Sexual Activity  . Alcohol use: No    Alcohol/week: 0.0 standard drinks  . Drug use:  No  . Sexual activity: Not on file

## 2020-05-06 ENCOUNTER — Other Ambulatory Visit: Payer: Self-pay | Admitting: Family

## 2020-05-08 ENCOUNTER — Ambulatory Visit (INDEPENDENT_AMBULATORY_CARE_PROVIDER_SITE_OTHER): Payer: PPO | Admitting: Internal Medicine

## 2020-05-08 ENCOUNTER — Other Ambulatory Visit: Payer: Self-pay

## 2020-05-08 ENCOUNTER — Encounter: Payer: Self-pay | Admitting: Internal Medicine

## 2020-05-08 VITALS — BP 136/84 | HR 64 | Temp 98.2°F | Ht 64.0 in | Wt 153.0 lb

## 2020-05-08 DIAGNOSIS — M1811 Unilateral primary osteoarthritis of first carpometacarpal joint, right hand: Secondary | ICD-10-CM

## 2020-05-08 DIAGNOSIS — R739 Hyperglycemia, unspecified: Secondary | ICD-10-CM

## 2020-05-08 DIAGNOSIS — E785 Hyperlipidemia, unspecified: Secondary | ICD-10-CM | POA: Diagnosis not present

## 2020-05-08 DIAGNOSIS — N1831 Chronic kidney disease, stage 3a: Secondary | ICD-10-CM | POA: Diagnosis not present

## 2020-05-08 DIAGNOSIS — Z9861 Coronary angioplasty status: Secondary | ICD-10-CM | POA: Diagnosis not present

## 2020-05-08 DIAGNOSIS — J301 Allergic rhinitis due to pollen: Secondary | ICD-10-CM

## 2020-05-08 DIAGNOSIS — I10 Essential (primary) hypertension: Secondary | ICD-10-CM

## 2020-05-08 DIAGNOSIS — Z0000 Encounter for general adult medical examination without abnormal findings: Secondary | ICD-10-CM | POA: Diagnosis not present

## 2020-05-08 DIAGNOSIS — I2510 Atherosclerotic heart disease of native coronary artery without angina pectoris: Secondary | ICD-10-CM

## 2020-05-08 DIAGNOSIS — Z0001 Encounter for general adult medical examination with abnormal findings: Secondary | ICD-10-CM

## 2020-05-08 LAB — LIPID PANEL
Cholesterol: 139 mg/dL (ref 0–200)
HDL: 47.9 mg/dL (ref >39.00)
LDL Cholesterol: 62 mg/dL (ref 0–99)
NonHDL: 91.55
Total CHOL/HDL Ratio: 3
Triglycerides: 146 mg/dL (ref 0.0–149.0)
VLDL: 29.2 mg/dL (ref 0.0–40.0)

## 2020-05-08 LAB — BASIC METABOLIC PANEL
BUN: 24 mg/dL — ABNORMAL HIGH (ref 6–23)
CO2: 33 mEq/L — ABNORMAL HIGH (ref 19–32)
Calcium: 9 mg/dL (ref 8.4–10.5)
Chloride: 103 mEq/L (ref 96–112)
Creatinine, Ser: 1 mg/dL (ref 0.40–1.20)
GFR: 50.38 mL/min — ABNORMAL LOW (ref >60.00)
Glucose, Bld: 84 mg/dL (ref 70–99)
Potassium: 3.8 mEq/L (ref 3.5–5.1)
Sodium: 140 mEq/L (ref 135–145)

## 2020-05-08 LAB — CBC WITH DIFFERENTIAL/PLATELET
Basophils Absolute: 0 10*3/uL (ref 0.0–0.1)
Basophils Relative: 0.4 % (ref 0.0–3.0)
Eosinophils Absolute: 0.2 10*3/uL (ref 0.0–0.7)
Eosinophils Relative: 1.7 % (ref 0.0–5.0)
HCT: 42 % (ref 36.0–46.0)
Hemoglobin: 13.9 g/dL (ref 12.0–15.0)
Lymphocytes Relative: 28.2 % (ref 12.0–46.0)
Lymphs Abs: 2.6 10*3/uL (ref 0.7–4.0)
MCHC: 33.2 g/dL (ref 30.0–36.0)
MCV: 94.6 fl (ref 78.0–100.0)
Monocytes Absolute: 0.8 10*3/uL (ref 0.1–1.0)
Monocytes Relative: 8.5 % (ref 3.0–12.0)
Neutro Abs: 5.6 10*3/uL (ref 1.4–7.7)
Neutrophils Relative %: 61.2 % (ref 43.0–77.0)
Platelets: 231 10*3/uL (ref 150.0–400.0)
RBC: 4.44 Mil/uL (ref 3.87–5.11)
RDW: 13.7 % (ref 11.5–15.5)
WBC: 9.2 10*3/uL (ref 4.0–10.5)

## 2020-05-08 LAB — HEPATIC FUNCTION PANEL
ALT: 16 U/L (ref 0–35)
AST: 19 U/L (ref 0–37)
Albumin: 3.9 g/dL (ref 3.5–5.2)
Alkaline Phosphatase: 81 U/L (ref 39–117)
Bilirubin, Direct: 0.1 mg/dL (ref 0.0–0.3)
Total Bilirubin: 0.5 mg/dL (ref 0.2–1.2)
Total Protein: 6.4 g/dL (ref 6.0–8.3)

## 2020-05-08 LAB — TSH: TSH: 1.56 u[IU]/mL (ref 0.35–4.50)

## 2020-05-08 LAB — HEMOGLOBIN A1C: Hgb A1c MFr Bld: 6.1 % (ref 4.6–6.5)

## 2020-05-08 MED ORDER — METOPROLOL SUCCINATE ER 25 MG PO TB24: 25.0000 mg | ORAL_TABLET | Freq: Every day | ORAL | 1 refills | Status: DC

## 2020-05-08 MED ORDER — LORATADINE 10 MG PO TABS: 10.0000 mg | ORAL_TABLET | Freq: Every day | ORAL | 3 refills | Status: DC

## 2020-05-08 MED ORDER — HYDROCHLOROTHIAZIDE 25 MG PO TABS: 25.0000 mg | ORAL_TABLET | Freq: Every day | ORAL | 1 refills | Status: DC

## 2020-05-08 NOTE — Patient Instructions (Signed)
Health Maintenance, Female Adopting a healthy lifestyle and getting preventive care are important in promoting health and wellness. Ask your health care provider about:  The right schedule for you to have regular tests and exams.  Things you can do on your own to prevent diseases and keep yourself healthy. What should I know about diet, weight, and exercise? Eat a healthy diet  Eat a diet that includes plenty of vegetables, fruits, low-fat dairy products, and lean protein.  Do not eat a lot of foods that are high in solid fats, added sugars, or sodium.   Maintain a healthy weight Body mass index (BMI) is used to identify weight problems. It estimates body fat based on height and weight. Your health care provider can help determine your BMI and help you achieve or maintain a healthy weight. Get regular exercise Get regular exercise. This is one of the most important things you can do for your health. Most adults should:  Exercise for at least 150 minutes each week. The exercise should increase your heart rate and make you sweat (moderate-intensity exercise).  Do strengthening exercises at least twice a week. This is in addition to the moderate-intensity exercise.  Spend less time sitting. Even light physical activity can be beneficial. Watch cholesterol and blood lipids Have your blood tested for lipids and cholesterol at 85 years of age, then have this test every 5 years. Have your cholesterol levels checked more often if:  Your lipid or cholesterol levels are high.  You are older than 85 years of age.  You are at high risk for heart disease. What should I know about cancer screening? Depending on your health history and family history, you may need to have cancer screening at various ages. This may include screening for:  Breast cancer.  Cervical cancer.  Colorectal cancer.  Skin cancer.  Lung cancer. What should I know about heart disease, diabetes, and high blood  pressure? Blood pressure and heart disease  High blood pressure causes heart disease and increases the risk of stroke. This is more likely to develop in people who have high blood pressure readings, are of African descent, or are overweight.  Have your blood pressure checked: ? Every 3-5 years if you are 18-39 years of age. ? Every year if you are 40 years old or older. Diabetes Have regular diabetes screenings. This checks your fasting blood sugar level. Have the screening done:  Once every three years after age 40 if you are at a normal weight and have a low risk for diabetes.  More often and at a younger age if you are overweight or have a high risk for diabetes. What should I know about preventing infection? Hepatitis B If you have a higher risk for hepatitis B, you should be screened for this virus. Talk with your health care provider to find out if you are at risk for hepatitis B infection. Hepatitis C Testing is recommended for:  Everyone born from 1945 through 1965.  Anyone with known risk factors for hepatitis C. Sexually transmitted infections (STIs)  Get screened for STIs, including gonorrhea and chlamydia, if: ? You are sexually active and are younger than 85 years of age. ? You are older than 85 years of age and your health care provider tells you that you are at risk for this type of infection. ? Your sexual activity has changed since you were last screened, and you are at increased risk for chlamydia or gonorrhea. Ask your health care provider   if you are at risk.  Ask your health care provider about whether you are at high risk for HIV. Your health care provider may recommend a prescription medicine to help prevent HIV infection. If you choose to take medicine to prevent HIV, you should first get tested for HIV. You should then be tested every 3 months for as long as you are taking the medicine. Pregnancy  If you are about to stop having your period (premenopausal) and  you may become pregnant, seek counseling before you get pregnant.  Take 400 to 800 micrograms (mcg) of folic acid every day if you become pregnant.  Ask for birth control (contraception) if you want to prevent pregnancy. Osteoporosis and menopause Osteoporosis is a disease in which the bones lose minerals and strength with aging. This can result in bone fractures. If you are 65 years old or older, or if you are at risk for osteoporosis and fractures, ask your health care provider if you should:  Be screened for bone loss.  Take a calcium or vitamin D supplement to lower your risk of fractures.  Be given hormone replacement therapy (HRT) to treat symptoms of menopause. Follow these instructions at home: Lifestyle  Do not use any products that contain nicotine or tobacco, such as cigarettes, e-cigarettes, and chewing tobacco. If you need help quitting, ask your health care provider.  Do not use street drugs.  Do not share needles.  Ask your health care provider for help if you need support or information about quitting drugs. Alcohol use  Do not drink alcohol if: ? Your health care provider tells you not to drink. ? You are pregnant, may be pregnant, or are planning to become pregnant.  If you drink alcohol: ? Limit how much you use to 0-1 drink a day. ? Limit intake if you are breastfeeding.  Be aware of how much alcohol is in your drink. In the [place] one drink equals one 12 oz bottle of beer (355 mL), one 5 oz glass of wine (148 mL), or one 1 oz glass of hard liquor (44 mL). General instructions  Schedule regular health, dental, and eye exams.  Stay current with your vaccines.  Tell your health care provider if: ? You often feel depressed. ? You have ever been abused or do not feel safe at home. Summary  Adopting a healthy lifestyle and getting preventive care are important in promoting health and wellness.  Follow your health care provider's instructions about healthy  diet, exercising, and getting tested or screened for diseases.  Follow your health care provider's instructions on monitoring your cholesterol and blood pressure. This information is not intended to replace advice given to you by your health care provider. Make sure you discuss any questions you have with your health care provider. Document Revised: 03/10/2018 Document Reviewed: 03/10/2018 Elsevier Patient Education  2021 Elsevier Inc.  

## 2020-05-08 NOTE — Progress Notes (Signed)
Subjective:  Patient ID: Krystal [name], female    DOB: 18-Oct-1929  Age: 87 y.o. MRN: [id number]  CC: Hypertension, Hyperlipidemia, and Coronary Artery Disease  This visit occurred during the SARS-CoV-2 public health emergency.  Safety protocols were in place, including screening questions prior to the visit, additional usage of staff PPE, and extensive cleaning of exam room while observing appropriate contact time as indicated for disinfecting solutions.    HPI Krystal [name] presents for a CPX.  She tells me her blood pressure has been well controlled.  She is active and denies any recent episodes of chest pain, shortness of breath, palpitations, edema, fatigue, dizziness, or lightheadedness.  Outpatient Medications Prior to Visit  Medication Sig Dispense Refill  . acetaminophen (TYLENOL) 500 MG tablet Take 500 mg by mouth every 4 (four) hours as needed for mild pain or headache.    . albuterol (VENTOLIN HFA) 108 (90 Base) MCG/ACT inhaler TAKE 2 PUFFS BY MOUTH EVERY 6 HOURS AS NEEDED FOR WHEEZE OR SHORTNESS OF BREATH 8.5 each 1  . ALPRAZolam (XANAX) 0.5 MG tablet Take 1 tablet (0.5 mg total) by mouth 3 (three) times daily as needed for anxiety. 270 tablet 1  . ascorbic acid (VITAMIN C) 500 MG tablet Take 500 mg by mouth daily.    Marland Kitchen azelastine (ASTELIN) 0.1 % nasal spray Place 2 sprays into both nostrils 2 (two) times daily. Use in each nostril as directed 90 mL 1  . calcium carbonate (OS-CAL) 600 MG TABS tablet Take 600 mg by mouth 2 (two) times daily with a meal.    . cholecalciferol (VITAMIN D) 1000 UNITS tablet Take 2,000 Units by mouth daily.     Marland Kitchen dicyclomine (BENTYL) 10 MG capsule Take 1 capsule (10 mg total) by mouth 4 (four) times daily -  before meals and at bedtime. As needed for cramps 60 capsule 0  . docusate sodium (COLACE) 100 MG capsule Take 100 mg by mouth every other day.    . fluticasone (FLONASE) 50 MCG/ACT nasal spray PLACE 1 SPRAY INTO BOTH NOSTRILS DAILY. 48 mL 1  .  gabapentin (NEURONTIN) 100 MG capsule 1 po q hs 60 capsule 4  . lansoprazole (PREVACID) 15 MG capsule Take 1 capsule (15 mg total) by mouth 2 (two) times daily before a meal. 60 capsule 11  . losartan (COZAAR) 50 MG tablet Take 1 tablet (50 mg total) by mouth daily. 90 tablet 1  . meloxicam (MOBIC) 7.5 MG tablet Take 1 tablet (7.5 mg total) by mouth daily. 90 tablet 0  . PREMARIN vaginal cream APPLY 1 APPLICATORFUL 2 3 TIMES A WEEK AS DIRECTED    . Probiotic Product (ALIGN) 4 MG CAPS Take 4 mg by mouth as needed.     . RESTASIS 0.05 % ophthalmic emulsion     . rosuvastatin (CRESTOR) 10 MG tablet Take 1 tablet (10 mg total) by mouth daily. 90 tablet 3  . SHINGRIX injection     . triamcinolone cream (KENALOG) 0.5 % Apply 1 application topically 3 (three) times daily. 30 g 0  . azithromycin (ZITHROMAX) 250 MG tablet 2 tabs po qd x 1 day; 1 tablet per day x 4 days; 6 tablet 0  . hydrochlorothiazide (HYDRODIURIL) 25 MG tablet Take 1 tablet (25 mg total) by mouth daily. 90 tablet 1  . loratadine (CLARITIN) 10 MG tablet Take 1 tablet (10 mg total) by mouth daily. 90 tablet 3  . metoprolol succinate (TOPROL-XL) 25 MG 24 hr tablet Take 1  tablet (25 mg total) by mouth daily. 90 tablet 1  . predniSONE (DELTASONE) 20 MG tablet Take 1 tablet (20 mg total) by mouth daily with breakfast. 5 tablet 0   No facility-administered medications prior to visit.    ROS Review of Systems  Constitutional: Negative for appetite change, diaphoresis, fatigue and unexpected weight change.  HENT: Negative.   Eyes: Negative for visual disturbance.  Respiratory: Negative for cough, chest tightness, shortness of breath and wheezing.   Cardiovascular: Negative for chest pain, palpitations and leg swelling.  Gastrointestinal: Negative for abdominal pain, diarrhea and nausea.  Genitourinary: Negative for difficulty urinating, dysuria and hematuria.  Musculoskeletal: Positive for arthralgias. Negative for back pain and  myalgias.       She complains of arthritis pain in her right hand.  Skin: Negative.   Neurological: Negative.  Negative for dizziness, weakness, light-headedness and headaches.  Hematological: Negative for adenopathy. Does not bruise/bleed easily.  Psychiatric/Behavioral: Negative.     Objective:  BP 136/84   Pulse 64   Temp 98.2 F (36.8 C) (Oral)   Ht 5\' 4"  (1.626 m)   Wt 153 lb (69.4 kg)   SpO2 96%   BMI 26.26 kg/m   BP Readings from Last 3 Encounters:  05/08/20 136/84  12/20/19 138/74  11/02/19 (!) 144/90    Wt Readings from Last 3 Encounters:  05/08/20 153 lb (69.4 kg)  12/20/19 153 lb (69.4 kg)  11/02/19 154 lb 2 oz (69.9 kg)    Physical Exam Vitals reviewed.  Constitutional:      Appearance: Normal appearance.  HENT:     Nose: Nose normal.     Mouth/Throat:     Mouth: Mucous membranes are moist.  Eyes:     General: No scleral icterus.    Conjunctiva/sclera: Conjunctivae normal.  Cardiovascular:     Rate and Rhythm: Normal rate and regular rhythm.     Heart sounds: No murmur heard.   Pulmonary:     Effort: Pulmonary effort is normal.     Breath sounds: No stridor. No wheezing, rhonchi or rales.  Abdominal:     General: Abdomen is flat. Bowel sounds are normal. There is no distension.     Palpations: Abdomen is soft. There is no hepatomegaly, splenomegaly or mass.     Tenderness: There is no abdominal tenderness.  Musculoskeletal:        General: Normal range of motion.     Cervical back: Neck supple.     Right lower leg: No edema.     Left lower leg: No edema.  Lymphadenopathy:     Cervical: No cervical adenopathy.  Skin:    General: Skin is warm and dry.     Coloration: Skin is not pale.  Neurological:     General: No focal deficit present.     Mental Status: She is alert.  Psychiatric:        Mood and Affect: Mood normal.        Behavior: Behavior normal.     Lab Results  Component Value Date   WBC 9.2 05/08/2020   HGB 13.9  05/08/2020   HCT 42.0 05/08/2020   PLT 231.0 05/08/2020   GLUCOSE 84 05/08/2020   CHOL 139 05/08/2020   TRIG 146.0 05/08/2020   HDL 47.90 05/08/2020   LDLDIRECT 160.4 12/19/2008   LDLCALC 62 05/08/2020   ALT 16 05/08/2020   AST 19 05/08/2020   NA 140 05/08/2020   K 3.8 05/08/2020   CL 103 05/08/2020  CREATININE 1.00 05/08/2020   BUN 24 (H) 05/08/2020   CO2 33 (H) 05/08/2020   TSH 1.56 05/08/2020   HGBA1C 6.1 05/08/2020    DG Shoulder Right  Result Date: 12/11/2017 CLINICAL DATA:  Lateral right shoulder pain since a fall 1 week ago. EXAM: RIGHT SHOULDER - 2+ VIEW COMPARISON:  None. FINDINGS: Diffuse osteopenia. No fracture or dislocation seen. IMPRESSION: No fracture or dislocation. Electronically Signed   By: Claudie Revering M.D.   On: 12/11/2017 11:38    Assessment & Plan:   Cayle was seen today for hypertension, hyperlipidemia and coronary artery disease.  Diagnoses and all orders for this visit:  Seasonal allergic rhinitis due to pollen -     loratadine (CLARITIN) 10 MG tablet; Take 1 tablet (10 mg total) by mouth daily.  Essential hypertension- Her blood pressure is adequately well controlled. -     metoprolol succinate (TOPROL-XL) 25 MG 24 hr tablet; Take 1 tablet (25 mg total) by mouth daily. -     CBC with Differential/Platelet; Future -     Basic metabolic panel; Future -     TSH; Future -     TSH -     Basic metabolic panel -     CBC with Differential/Platelet -     hydrochlorothiazide (HYDRODIURIL) 25 MG tablet; Take 1 tablet (25 mg total) by mouth daily.  CAD S/P percutaneous coronary angioplasty- She has had no recent episodes of angina.  Will continue the beta-blocker. -     metoprolol succinate (TOPROL-XL) 25 MG 24 hr tablet; Take 1 tablet (25 mg total) by mouth daily.  Stage 3a chronic kidney disease ([place])- Her renal function is stable.  I recommended that she avoid NSAIDs. -     Urinalysis, Routine w reflex microscopic; Future -     Urinalysis, Routine w  reflex microscopic  Hyperglycemia- Her A1c is at 6.1%.  Medical therapy is not indicated. -     Hemoglobin A1c; Future -     Hemoglobin A1c  Hyperlipidemia with target LDL less than 70- She is not willing to take a statin. -     Lipid panel; Future -     TSH; Future -     Hepatic function panel; Future -     Hepatic function panel -     TSH -     Lipid panel  Encounter for general adult medical examination with abnormal findings-  Exam completed, labs reviewed, vaccines reviewed and updated, no cancer screenings are indicated, patient education was given.  Primary osteoarthritis of first carpometacarpal joint of right hand -     Ambulatory referral to Orthopedic Surgery   I have discontinued Aubrii L. Kainz's azithromycin and predniSONE. I am also having her maintain her Align, cholecalciferol, calcium carbonate, acetaminophen, Restasis, triamcinolone cream, Premarin, Shingrix, dicyclomine, docusate sodium, ascorbic acid, azelastine, rosuvastatin, lansoprazole, ALPRAZolam, losartan, meloxicam, fluticasone, gabapentin, albuterol, metoprolol succinate, loratadine, and hydrochlorothiazide.  Meds ordered this encounter  Medications  . metoprolol succinate (TOPROL-XL) 25 MG 24 hr tablet    Sig: Take 1 tablet (25 mg total) by mouth daily.    Dispense:  90 tablet    Refill:  1  . loratadine (CLARITIN) 10 MG tablet    Sig: Take 1 tablet (10 mg total) by mouth daily.    Dispense:  90 tablet    Refill:  3  . hydrochlorothiazide (HYDRODIURIL) 25 MG tablet    Sig: Take 1 tablet (25 mg total) by mouth daily.  Dispense:  90 tablet    Refill:  1     Follow-up: Return in about 6 months (around 11/05/2020).  Scarlette Calico, MD

## 2020-05-23 ENCOUNTER — Ambulatory Visit: Payer: PPO | Admitting: Orthopedic Surgery

## 2020-06-07 ENCOUNTER — Telehealth: Payer: Self-pay | Admitting: Internal Medicine

## 2020-06-07 NOTE — Telephone Encounter (Signed)
Noted  

## 2020-06-07 NOTE — Telephone Encounter (Signed)
Patient called and was wondering if something could be called in for nausea and a headache. She said it has been going on since last Friday. She said she has taken tylenol for the headache. Virtual was offered for 06/11/20. Please advise

## 2020-06-07 NOTE — Telephone Encounter (Signed)
Pt need a confirmed OV for evaluation.

## 2020-06-07 NOTE — Telephone Encounter (Signed)
Patient has an appt w/ Dr Sharlet Salina 06/08/20

## 2020-06-08 ENCOUNTER — Encounter: Payer: Self-pay | Admitting: Internal Medicine

## 2020-06-08 ENCOUNTER — Telehealth (INDEPENDENT_AMBULATORY_CARE_PROVIDER_SITE_OTHER): Payer: PPO | Admitting: Internal Medicine

## 2020-06-08 DIAGNOSIS — B349 Viral infection, unspecified: Secondary | ICD-10-CM | POA: Diagnosis not present

## 2020-06-08 MED ORDER — BUTALBITAL-APAP-CAFFEINE 50-325-40 MG PO TABS: 1.0000 | ORAL_TABLET | Freq: Four times a day (QID) | ORAL | 0 refills | Status: DC | PRN

## 2020-06-08 NOTE — Progress Notes (Signed)
Virtual Visit via Audio Note  I connected with Krystal [name] on 06/08/20 at  9:40 AM EST by an audio enabled telemedicine application and verified that I am speaking with the correct person using two identifiers.  The patient and the provider were at separate locations throughout the entire encounter. Patient location: home, Provider location: work   I discussed the limitations of evaluation and management by telemedicine and the availability of in person appointments. The patient expressed understanding and agreed to proceed. The patient and the provider were the only parties present for the visit unless noted in HPI below.  History of Present Illness: The patient is a 87 y.o. female with visit for headache and viral illness since last Friday. Has not been able to get rid of headache. Diarrhea and nausea have resolved. Headache is constant and tylenol does not help. Has no fevers or chills. Denies cough or SOB. Overall it is not improving. Has had covid-19 2 shots and booster she thinks but they said she got this at our office. No sick contacts. BP this morning 120/77 this morning.   Observations/Objective: A and O times 3, no cough during visit, voice strong, no dyspnea during visit  Assessment and Plan: See problem oriented charting  Follow Up Instructions: rx fioricet for headache short term  Visit time 12 minutes in non-face to face communication with patient and coordination of care.  I discussed the assessment and treatment plan with the patient. The patient was provided an opportunity to ask questions and all were answered. The patient agreed with the plan and demonstrated an understanding of the instructions.   The patient was advised to call back or seek an in-person evaluation if the symptoms worsen or if the condition fails to improve as anticipated.  Krystal Koch, MD

## 2020-06-08 NOTE — Assessment & Plan Note (Signed)
Unknown type viral illness. Overall resolving. Is fully vaccinated against covid-19 and testing would not be beneficial at this time for either flu or covid-19 as it would not likely be positive and would not impact management. Rx fioricet for headache short term.

## 2020-06-13 ENCOUNTER — Ambulatory Visit: Payer: PPO | Admitting: Orthopedic Surgery

## 2020-06-13 ENCOUNTER — Ambulatory Visit: Payer: Self-pay

## 2020-06-13 ENCOUNTER — Other Ambulatory Visit: Payer: Self-pay

## 2020-06-13 ENCOUNTER — Ambulatory Visit (INDEPENDENT_AMBULATORY_CARE_PROVIDER_SITE_OTHER): Payer: PPO

## 2020-06-13 DIAGNOSIS — M79641 Pain in right hand: Secondary | ICD-10-CM

## 2020-06-13 DIAGNOSIS — M1811 Unilateral primary osteoarthritis of first carpometacarpal joint, right hand: Secondary | ICD-10-CM | POA: Diagnosis not present

## 2020-06-16 ENCOUNTER — Encounter: Payer: Self-pay | Admitting: Orthopedic Surgery

## 2020-06-16 NOTE — Progress Notes (Signed)
Office Visit Note   Patient: Krystal [name]           Date of Birth: 1930/02/16           MRN: [id number] Visit Date: 06/13/2020 Requested by: Janith Lima, MD [place],  [place] [address] PCP: Janith Lima, MD  Subjective: Chief Complaint  Patient presents with   Right Hand - Pain    HPI: Krystal [name] is a 87 y.o. female who presents to the office complaining of right thumb pain.  Patient is right-hand dominant.  She denies any recent injury.  She has had pain for several months but has been worsening recently.  She notes pain bothers her at the base of the thumb and bothers her at rest.  She has been using topical diclofenac without any lasting relief.  It does help but only last for about an hour or 2.  She has occasional numbness and tingling in the right thumb and middle finger but this is not concerning to her and the major complaint is the base of thumb pain.  She has had previous injection into her [facility] joint that provided good relief and lasted about 1 year..                ROS: All systems reviewed are negative as they relate to the chief complaint within the history of present illness.  Patient denies fevers or chills.  Assessment & Plan: Visit Diagnoses:  1. Pain in right hand   2. Primary osteoarthritis of first carpometacarpal joint of right hand     Plan: Patient is an 87 year old female who presents complaint of right thumb pain.  She has history of thumb pain that was successfully treated with cortisone injection to the [facility] joint in the past.  Radiographs taken today show moderate to severe degenerative changes of the first [facility] joint.  Discussed options available to patient.  She would like to try another injection.  Right CMC injection was administered under ultrasound guidance and patient tolerated the procedure well.  Plan for her to follow-up as needed.  Follow-Up Instructions: No follow-ups on file.   Orders:  Orders Placed This Encounter   Procedures   XR Hand Complete Right   US Guided Needle Placement - No Linked Charges   No orders of the defined types were placed in this encounter.     Procedures: Hand/UE Inj: R thumb CMC for osteoarthritis on 06/17/2020 10:14 PM Details: 25 G needle, ultrasound-guided dorsal approach      Clinical Data: No additional findings.  Objective: Vital Signs: There were no vitals taken for this visit.  Physical Exam:  Constitutional: Patient appears well-developed HEENT:  Head: Normocephalic Eyes:EOM are normal Neck: Normal range of motion Cardiovascular: Normal rate Pulmonary/chest: Effort normal Neurologic: Patient is alert Skin: Skin is warm Psychiatric: Patient has normal mood and affect  Ortho Exam: Ortho exam demonstrates tenderness at the first [facility] joint.  Pain with circumduction and abduction of the thumb.  Negative Finkelstein sign.  No tenderness over the anatomic snuffbox.  No tenderness over the scaphoid tubercle.  Able to actively extend her right thumb.  No pain with passive motion of the right wrist.  Specialty Comments:  No specialty comments available.  Imaging: No results found.   PMFS History: Patient Active Problem List   Diagnosis Date Noted   Viral illness 06/08/2020   Encounter for general adult medical examination with abnormal findings 05/08/2020   Primary osteoarthritis  of first carpometacarpal joint of right hand 05/08/2020   Restless legs syndrome (RLS) 08/22/2019   Chronic peripheral neuropathic pain 08/22/2019   Chronic renal disease, stage 3, moderately decreased glomerular filtration rate (GFR) between 30-59 mL/min/1.73 square meter (HCC) 11/08/2018   Chronic idiopathic constipation 10/19/2017   LBBB (left bundle branch block) 07/16/2016   Cardiomyopathy ([place]) 07/16/2016   Headache 01/17/2016   Benign paroxysmal positional vertigo 01/17/2016   Nocturnal foot cramps 01/17/2016   Hyperglycemia 05/23/2015   IBS  (irritable bowel syndrome) 10/13/2011   GERD (gastroesophageal reflux disease) 06/25/2010   RENAL CYST 11/18/2007   OSTEOPOROSIS 11/18/2007   Allergic rhinitis 10/06/2007   Hyperlipidemia with target LDL less than 70 07/19/2007   GAD (generalized anxiety disorder) 07/19/2007   Essential hypertension 07/19/2007   CAD S/P percutaneous coronary angioplasty 07/19/2007   VENOUS INSUFFICIENCY 07/19/2007   Osteoarthritis 07/19/2007   LOW BACK PAIN SYNDROME 07/19/2007   Past Medical History:  Diagnosis Date   Adenomatous colon polyp    Allergy    seasonal   Anemia    pt. denies   Anxiety    CAD (coronary artery disease)    Cataract    cataracts removed left eye.   Chronic lower back pain    Diverticulosis    GERD (gastroesophageal reflux disease)    Headache(784.0)    Hiatal hernia    Hyperlipidemia    Hypertension    IBS (irritable bowel syndrome) 10/13/2011   Osteoarthritis    Osteoporosis    Renal cysts, acquired, bilateral    SBO (small bowel obstruction) ([place]) 09/05/2015   Venous insufficiency     Family History  Problem Relation Age of Onset   Heart disease Father    Stroke Sister    Colon cancer Neg Hx     Past Surgical History:  Procedure Laterality Date   ABDOMINAL HYSTERECTOMY     BLADDER REPAIR     CARDIAC CATHETERIZATION  01/12/2002   90% stenosis of the left circumflex, stented with a 3x76mm Cypher stent, resulting in reduction of 90% to 10%    CARDIOVASCULAR STRESS TEST  03/22/2007   Mild inferolateral thinning toward the apex without significant ischemia. Nondiagnostic electrocardiogram.   CATARACT EXTRACTION     bilateral   COLONOSCOPY  08/17/2008   adenomatous polyp, diverticulosis, external hemorrhoids   COLONOSCOPY W/ BIOPSIES     multiple    LEFT LOWER EXTREMITY VENOUS DOPPLER  06/18/2011   No evidence of left lower extremity DVT   TRANSTHORACIC ECHOCARDIOGRAM  11/18/2012   EF 55-97%, LV systolic  mild-moderately reduced, mild-moderate mitral valve regurg, mild-moderate tricuspid valve regurg. NSR-LBBB with occas PVCs   UPPER GASTROINTESTINAL ENDOSCOPY  07/03/2010   hiatal hernia   VARICOSE VEIN SURGERY     Social History   Occupational History   Occupation: retired  Tobacco Use   Smoking status: Never Smoker   Smokeless tobacco: Never Used  Scientific laboratory technician Use: Never used  Substance and Sexual Activity   Alcohol use: No    Alcohol/week: 0.0 standard drinks   Drug use: No   Sexual activity: Not on file

## 2020-06-17 ENCOUNTER — Encounter: Payer: Self-pay | Admitting: Orthopedic Surgery

## 2020-06-17 DIAGNOSIS — M1811 Unilateral primary osteoarthritis of first carpometacarpal joint, right hand: Secondary | ICD-10-CM | POA: Diagnosis not present

## 2020-06-18 ENCOUNTER — Telehealth: Payer: Self-pay | Admitting: Internal Medicine

## 2020-06-18 NOTE — Progress Notes (Signed)
  Chronic Care Management   Note  06/18/2020 Name: Krystal [name] MRN: [id number] DOB: Sep 27, 1929  Krystal [name] is a 87 y.o. year old female who is a primary care patient of Janith Lima, MD. I reached out to Krystal [name] by phone today in response to a referral sent by Ms. Trinidad Curet Rasmussen's PCP, Janith Lima, MD.   Ms. Baum was given information about Chronic Care Management services today including:  1. CCM service includes personalized support from designated clinical staff supervised by her physician, including individualized plan of care and coordination with other care providers 2. 24/7 contact phone numbers for assistance for urgent and routine care needs. 3. Service will only be billed when office clinical staff spend 20 minutes or more in a month to coordinate care. 4. Only one practitioner may furnish and bill the service in a calendar month. 5. The patient may stop CCM services at any time (effective at the end of the month) by phone call to the office staff.   Patient agreed to services and verbal consent obtained.   Follow up plan:   Carley Perdue [employer] Scheduler

## 2020-06-22 ENCOUNTER — Telehealth: Payer: Self-pay | Admitting: Internal Medicine

## 2020-06-22 MED ORDER — FLUTICASONE PROPIONATE 50 MCG/ACT NA SUSP: 1.0000 | Freq: Every day | NASAL | 1 refills | Status: DC

## 2020-06-22 NOTE — Telephone Encounter (Signed)
Patient requesting refill to [id number] - [place], [place] - [address]  fluticasone (FLONASE) 50 MCG/ACT nasal spray

## 2020-06-25 ENCOUNTER — Other Ambulatory Visit: Payer: Self-pay | Admitting: Internal Medicine

## 2020-07-25 ENCOUNTER — Other Ambulatory Visit: Payer: Self-pay | Admitting: Cardiovascular Disease

## 2020-07-25 DIAGNOSIS — I2510 Atherosclerotic heart disease of native coronary artery without angina pectoris: Secondary | ICD-10-CM

## 2020-07-25 DIAGNOSIS — E785 Hyperlipidemia, unspecified: Secondary | ICD-10-CM

## 2020-07-26 ENCOUNTER — Other Ambulatory Visit: Payer: Self-pay

## 2020-07-27 ENCOUNTER — Ambulatory Visit (INDEPENDENT_AMBULATORY_CARE_PROVIDER_SITE_OTHER): Payer: PPO | Admitting: Internal Medicine

## 2020-07-27 ENCOUNTER — Encounter: Payer: Self-pay | Admitting: Internal Medicine

## 2020-07-27 VITALS — BP 140/80 | HR 84 | Temp 98.2°F | Resp 18 | Ht 64.0 in | Wt 156.0 lb

## 2020-07-27 DIAGNOSIS — M5441 Lumbago with sciatica, right side: Secondary | ICD-10-CM | POA: Diagnosis not present

## 2020-07-27 MED ORDER — METHYLPREDNISOLONE ACETATE 40 MG/ML IJ SUSP
40.0000 mg | Freq: Once | INTRAMUSCULAR | Status: AC
  Administered 2020-07-27: 40 mg via INTRAMUSCULAR

## 2020-07-27 MED ORDER — BACLOFEN 10 MG PO TABS: 10.0000 mg | ORAL_TABLET | Freq: Two times a day (BID) | ORAL | 0 refills | Status: DC | PRN

## 2020-07-27 MED ORDER — KETOROLAC TROMETHAMINE 30 MG/ML IJ SOLN
30.0000 mg | Freq: Once | INTRAMUSCULAR | Status: AC
  Administered 2020-07-27: 30 mg via INTRAMUSCULAR

## 2020-07-27 NOTE — Progress Notes (Signed)
   Subjective:   Patient ID: Krystal [name], female    DOB: [birth year], 87 y.o.   MRN: [id number]  HPI The patient is an 87 YO female coming in for concerns about low back pain. Started 3 days ago. Overall is worsening. Has tried aleve/advil without relief. Denies numbness or weakness in her legs. Does have chronic neuropathy which is not changed. Does have radiation of the pain down the right thigh. Pain worse with twisting, bending, moving. Denies overuse or injury. Walks regularly but no different than usual. No falls recently.   Review of Systems  Constitutional: Positive for activity change. Negative for appetite change, chills, fatigue, fever and unexpected weight change.  Respiratory: Negative.   Cardiovascular: Negative.   Gastrointestinal: Negative.   Musculoskeletal: Positive for arthralgias, back pain and myalgias. Negative for gait problem and joint swelling.  Skin: Negative.   Neurological: Negative.     Objective:  Physical Exam Constitutional:      Appearance: She is well-developed.  HENT:     Head: Normocephalic and atraumatic.  Cardiovascular:     Rate and Rhythm: Normal rate and regular rhythm.  Pulmonary:     Effort: Pulmonary effort is normal. No respiratory distress.     Breath sounds: Normal breath sounds. No wheezing or rales.  Abdominal:     General: Bowel sounds are normal. There is no distension.     Palpations: Abdomen is soft.     Tenderness: There is no abdominal tenderness. There is no rebound.  Musculoskeletal:        General: Tenderness present.     Cervical back: Normal range of motion.     Comments: Pain right SI region and in the right thigh tender to touch  Skin:    General: Skin is warm and dry.  Neurological:     Mental Status: She is alert and oriented to person, place, and time.     Coordination: Coordination normal.     Vitals:   07/27/20 0910  BP: 140/80  Pulse: 84  Resp: 18  Temp: 98.2 F (36.8 C)  TempSrc: Oral  SpO2: 98%   Weight: 156 lb (70.8 kg)  Height: 5\' 4"  (1.626 m)    This visit occurred during the SARS-CoV-2 public health emergency.  Safety protocols were in place, including screening questions prior to the visit, additional usage of staff PPE, and extensive cleaning of exam room while observing appropriate contact time as indicated for disinfecting solutions.   Assessment & Plan:  Depo-medrol 40 mg IM Toradol 30 mg IM given at visit

## 2020-07-27 NOTE — Patient Instructions (Signed)
We have sent in baclofen to use at night time for pain if needed.   We have given you the steroid and the toradol shot.

## 2020-07-27 NOTE — Assessment & Plan Note (Signed)
Given depo-medrol 40 mg IM and toradol 30 mg IM at visit. Rx baclofen for qhs prn and can use tylenol or advil also.

## 2020-07-30 ENCOUNTER — Telehealth: Payer: Self-pay | Admitting: Internal Medicine

## 2020-07-30 ENCOUNTER — Telehealth: Payer: Self-pay | Admitting: Pharmacist

## 2020-07-30 NOTE — Telephone Encounter (Signed)
Pt has been informed and expressed understanding. She will give it a few more days and call back if still no change.

## 2020-07-30 NOTE — Telephone Encounter (Signed)
    Patient calling to report back pain, patient saw Dr Sharlet Salina on 4/29 She is requesting METHYLPREDNISOLONE  She will be seeing Ortho on 5/6  Please advise

## 2020-07-30 NOTE — Progress Notes (Signed)
Chronic Care Management Pharmacy Assistant   Name: Krystal [name]  MRN: [id number] DOB: 18-May-1929  Reason for Encounter: Initial Questions  Appt: Telephone 08/06/20 @ 9 am   Recent office visits:  07/27/20 Sharlet Salina (PCP) - Low back pain. Ketorolac & Methylprednisolone acetate injections given.  Start Baclofen 10 mg.   06/08/20 Crawford (PCP) - Viral Illness. Start butalbital-acetaminophen-caffeine 50-325-40 mg.   05/08/20 Ronnald Ramp (PCP) - Hypertension/Hyperlipidemia. No changes. F/u 6 months.  04/23/20 Valere Dross, [place] (PCP) - Sinusitis. Start Azithromycin 250 mg & Prednisone 20 mg.  Recent consult visits: 06/13/20 Marlou Sa (Ortho Surgeon) - Pain in right hand.  05/03/20 Dean (Ortho Surgeon) - Start Gabapentin 100 mg at bedtime.   02/03/20 Magnant (Ortho Surgeon) - Neuropathy. Stop Pregabalin. Start Gabapentin 100 mg. F/u 6 wks.  Hospital visits:  None in previous 6 months  Medications: Outpatient Encounter Medications as of 07/30/2020  Medication Sig Note  . acetaminophen (TYLENOL) 500 MG tablet Take 500 mg by mouth every 4 (four) hours as needed for mild pain or headache.   . albuterol (VENTOLIN HFA) 108 (90 Base) MCG/ACT inhaler TAKE 2 PUFFS BY MOUTH EVERY 6 HOURS AS NEEDED FOR WHEEZE OR SHORTNESS OF BREATH   . ALPRAZolam (XANAX) 0.5 MG tablet Take 1 tablet (0.5 mg total) by mouth 3 (three) times daily as needed for anxiety.   Marland Kitchen ascorbic acid (VITAMIN C) 500 MG tablet Take 500 mg by mouth daily.   Marland Kitchen azelastine (ASTELIN) 0.1 % nasal spray Place 2 sprays into both nostrils 2 (two) times daily. Use in each nostril as directed   . baclofen (LIORESAL) 10 MG tablet Take 1 tablet (10 mg total) by mouth 2 (two) times daily as needed for muscle spasms.   . calcium carbonate (OS-CAL) 600 MG TABS tablet Take 600 mg by mouth 2 (two) times daily with a meal.   . cholecalciferol (VITAMIN D) 1000 UNITS tablet Take 2,000 Units by mouth daily.  07/26/2015: Takes 2000   . dicyclomine (BENTYL) 10 MG capsule Take 1  capsule (10 mg total) by mouth 4 (four) times daily -  before meals and at bedtime. As needed for cramps   . docusate sodium (COLACE) 100 MG capsule Take 100 mg by mouth every other day.   . fluticasone (FLONASE) 50 MCG/ACT nasal spray Place 1 spray into both nostrils daily.   Marland Kitchen gabapentin (NEURONTIN) 100 MG capsule 1 po q hs   . hydrochlorothiazide (HYDRODIURIL) 25 MG tablet Take 1 tablet (25 mg total) by mouth daily.   . lansoprazole (PREVACID) 15 MG capsule Take 1 capsule (15 mg total) by mouth 2 (two) times daily before a meal.   . loratadine (CLARITIN) 10 MG tablet Take 1 tablet (10 mg total) by mouth daily.   Marland Kitchen losartan (COZAAR) 50 MG tablet Take 1 tablet (50 mg total) by mouth daily.   . metoprolol succinate (TOPROL-XL) 25 MG 24 hr tablet Take 1 tablet (25 mg total) by mouth daily.   Marland Kitchen PREMARIN vaginal cream APPLY 1 APPLICATORFUL 2 3 TIMES A WEEK AS DIRECTED   . Probiotic Product (ALIGN) 4 MG CAPS Take 4 mg by mouth as needed.    . RESTASIS 0.05 % ophthalmic emulsion    . rosuvastatin (CRESTOR) 10 MG tablet TAKE 1 TABLET BY MOUTH EVERY DAY   . SHINGRIX injection    . triamcinolone cream (KENALOG) 0.5 % Apply 1 application topically 3 (three) times daily.    No facility-administered encounter medications on file as of 07/30/2020.  Have you seen any other providers since your last visit? Patient states she seen Dr. Sharlet Salina on last Friday.  Any changes in your medications or health?  Patient states she had some pain in her back but has been doing better since getting an injection injection.  Any side effects from any medications?  Patient states no side effects at the moment.  Do you have any symptoms or problems not managed by your medications?  Patient doesn't currently have any symptoms.  Any concerns about your health right now?  Patient states not at this time.  Has your provider asked that you check blood pressure, blood sugar, or follow special diet at home?  Patient  checks BP daily but do not record readings. Patient states she will start keeping a daily log. Patient also states she doesn't follow any special diets or check her glucose.  Do you get any type of exercise on a regular basis?  Patient walks everdyay with her husband at the gym.  Can you think of a goal you would like to reach for your health?  Patient said not at this time.  Do you have any problems getting your medications?  Patient states she has no problem getting medicines.  Is there anything that you would like to discuss during the appointment?  Patient states she seen on tv where anyone over age 87 should not be taking Aspirins 81 mg, and wants to know if this is true. She also states she has been taking them for some years now.  Please bring medications and supplements to appointment  Star Rating Drugs: Losartan - last fill 06/15/20 90D Rosuvastatin - last fill 07/14/20 [place], RMA Clinical Pharmacists Assistant [phone]  Time Spent: [phone]

## 2020-07-30 NOTE — Progress Notes (Signed)
  Chronic Care Management   Note  07/30/2020 Name: Krystal [name] MRN: [id number] DOB: [birth year]  Krystal [name] is a 87 y.o. year old female who is a primary care patient of Janith Lima, MD. I reached out to Krystal [name] by phone today in response to a referral sent by Ms. Trinidad Curet Tensley's PCP, Janith Lima, MD.   Ms. Ormand was given information about Chronic Care Management services today including:  1. CCM service includes personalized support from designated clinical staff supervised by her physician, including individualized plan of care and coordination with other care providers 2. 24/7 contact phone numbers for assistance for urgent and routine care needs. 3. Service will only be billed when office clinical staff spend 20 minutes or more in a month to coordinate care. 4. Only one practitioner may furnish and bill the service in a calendar month. 5. The patient may stop CCM services at any time (effective at the end of the month) by phone call to the office staff.   Patient agreed to services and verbal consent obtained.   Follow up plan:   Carley Perdue [employer] Scheduler

## 2020-07-30 NOTE — Telephone Encounter (Signed)
Is the baclofen helping with pain? The shot we gave her can take 3-4 days to kick in so I would give this a few more days.

## 2020-08-02 ENCOUNTER — Telehealth: Payer: PPO

## 2020-08-03 ENCOUNTER — Ambulatory Visit: Payer: PPO | Admitting: Surgical

## 2020-08-03 ENCOUNTER — Other Ambulatory Visit: Payer: Self-pay

## 2020-08-03 ENCOUNTER — Other Ambulatory Visit: Payer: Self-pay | Admitting: Internal Medicine

## 2020-08-03 ENCOUNTER — Ambulatory Visit (INDEPENDENT_AMBULATORY_CARE_PROVIDER_SITE_OTHER): Payer: PPO

## 2020-08-03 ENCOUNTER — Encounter: Payer: Self-pay | Admitting: Surgical

## 2020-08-03 DIAGNOSIS — M542 Cervicalgia: Secondary | ICD-10-CM | POA: Diagnosis not present

## 2020-08-03 DIAGNOSIS — M5441 Lumbago with sciatica, right side: Secondary | ICD-10-CM | POA: Diagnosis not present

## 2020-08-03 DIAGNOSIS — M5442 Lumbago with sciatica, left side: Secondary | ICD-10-CM | POA: Diagnosis not present

## 2020-08-03 MED ORDER — PREDNISONE 5 MG (21) PO TBPK: ORAL_TABLET | ORAL | 0 refills | Status: DC

## 2020-08-03 NOTE — Telephone Encounter (Signed)
She should not be out yet, this was a 2 week supply and it has been only a week. Is she out?

## 2020-08-04 ENCOUNTER — Encounter: Payer: Self-pay | Admitting: Surgical

## 2020-08-04 NOTE — Progress Notes (Signed)
Office Visit Note   Patient: Krystal [name]           Date of Birth: 1929/12/23           MRN: [id number] Visit Date: 08/03/2020 Requested by: Janith Lima, MD [place],  [place] [address] PCP: Janith Lima, MD  Subjective: Chief Complaint  Patient presents with  . Neck - Pain  . Lower Back - Pain    HPI: Krystal [name] is a 87 y.o. female who presents to the office complaining of neck and bilateral arm pain.  Patient complains of pain that began in her low back but has now migrated into her neck.  She complains of pain in her neck that travels into both shoulder blades with associated posterior arm radicular pain.  Pain is worse with turning her neck to the left and right, primarily to the right.  Left-sided shoulder blade and arm pain is bothering her more than the right side.  She has taken muscle relaxers (baclofen) which have provided some relief.  Denies any history of neck surgery.  She saw her primary care physician who gave her a steroid shot after she initially noticed her low back pain.  After the injection, her low back pain improved but she began to notice the neck and arm symptoms.  Denies any changes in her gait.  Denies any weakness..                ROS: All systems reviewed are negative as they relate to the chief complaint within the history of present illness.  Patient denies fevers or chills.  Assessment & Plan: Visit Diagnoses:  1. Neck pain   2. Bilateral low back pain with bilateral sciatica, unspecified chronicity     Plan: Patient is an 87 year old female who presents today complaining of primarily neck pain and bilateral arm radicular pain.  Pain began about a week ago after her low back pain improved.  She has no weakness on exam today but by her history, her findings are concerning for cervical radiculopathy.  Radiographs taken today show severe degenerative disc disease of the cervical spine with near complete loss of all disc space.   Discussed options available to patient.  After discussion, patient would like to try Medrol Dosepak with 6-week follow-up.  Follow-up in 6 weeks with Dr. Marlou Sa.  Follow-Up Instructions: No follow-ups on file.   Orders:  Orders Placed This Encounter  Procedures  . XR Cervical Spine 2 or 3 views  . XR Lumbar Spine 2-3 Views   Meds ordered this encounter  Medications  . predniSONE (STERAPRED UNI-PAK 21 TAB) 5 MG (21) TBPK tablet    Sig: TAKE AS DIRECTED    Dispense:  21 tablet    Refill:  0      Procedures: No procedures performed   Clinical Data: No additional findings.  Objective: Vital Signs: There were no vitals taken for this visit.  Physical Exam:  Constitutional: Patient appears well-developed HEENT:  Head: Normocephalic Eyes:EOM are normal Neck: Normal range of motion Cardiovascular: Normal rate Pulmonary/chest: Effort normal Neurologic: Patient is alert Skin: Skin is warm Psychiatric: Patient has normal mood and affect  Ortho Exam: Ortho exam demonstrates 5/5 motor strength of bilateral grip strength, finger abduction, pronation/supination, bicep, tricep, deltoid.  Excellent rotator cuff strength of both arms for supraspinatus, infraspinatus, subscapularis.  Positive Spurling sign.  Pain with rotation of the cervical spine to the left and right, primarily to the  right.  No pain with looking up or looking down.  Sensation intact through all dermatomes of the bilateral upper extremities.  Specialty Comments:  No specialty comments available.  Imaging: No results found.   PMFS History: Patient Active Problem List   Diagnosis Date Noted  . Viral illness 06/08/2020  . Encounter for general adult medical examination with abnormal findings 05/08/2020  . Primary osteoarthritis of first carpometacarpal joint of right hand 05/08/2020  . Restless legs syndrome (RLS) 08/22/2019  . Chronic peripheral neuropathic pain 08/22/2019  . Chronic renal disease, stage 3,  moderately decreased glomerular filtration rate (GFR) between 30-59 mL/min/1.73 square meter ([place]) 11/08/2018  . Chronic idiopathic constipation 10/19/2017  . LBBB (left bundle branch block) 07/16/2016  . Cardiomyopathy ([place]) 07/16/2016  . Headache 01/17/2016  . Benign paroxysmal positional vertigo 01/17/2016  . Nocturnal foot cramps 01/17/2016  . Hyperglycemia 05/23/2015  . IBS (irritable bowel syndrome) 10/13/2011  . GERD (gastroesophageal reflux disease) 06/25/2010  . RENAL CYST 11/18/2007  . OSTEOPOROSIS 11/18/2007  . Allergic rhinitis 10/06/2007  . Hyperlipidemia with target LDL less than 70 07/19/2007  . GAD (generalized anxiety disorder) 07/19/2007  . Essential hypertension 07/19/2007  . CAD S/P percutaneous coronary angioplasty 07/19/2007  . VENOUS INSUFFICIENCY 07/19/2007  . Osteoarthritis 07/19/2007  . LOW BACK PAIN SYNDROME 07/19/2007   Past Medical History:  Diagnosis Date  . Adenomatous colon polyp   . Allergy    seasonal  . Anemia    pt. denies  . Anxiety   . CAD (coronary artery disease)   . Cataract    cataracts removed left eye.  . Chronic lower back pain   . Diverticulosis   . GERD (gastroesophageal reflux disease)   . Headache(784.0)   . Hiatal hernia   . Hyperlipidemia   . Hypertension   . IBS (irritable bowel syndrome) 10/13/2011  . Osteoarthritis   . Osteoporosis   . Renal cysts, acquired, bilateral   . SBO (small bowel obstruction) ([place]) 09/05/2015  . Venous insufficiency     Family History  Problem Relation Age of Onset  . Heart disease Father   . Stroke Sister   . Colon cancer Neg Hx     Past Surgical History:  Procedure Laterality Date  . ABDOMINAL HYSTERECTOMY    . BLADDER REPAIR    . CARDIAC CATHETERIZATION  01/12/2002   90% stenosis of the left circumflex, stented with a 3x24mm Cypher stent, resulting in reduction of 90% to 10%   . CARDIOVASCULAR STRESS TEST  03/22/2007   Mild inferolateral thinning toward the apex without  significant ischemia. Nondiagnostic electrocardiogram.  . CATARACT EXTRACTION     bilateral  . COLONOSCOPY  08/17/2008   adenomatous polyp, diverticulosis, external hemorrhoids  . COLONOSCOPY W/ BIOPSIES     multiple   . LEFT LOWER EXTREMITY VENOUS DOPPLER  06/18/2011   No evidence of left lower extremity DVT  . TRANSTHORACIC ECHOCARDIOGRAM  11/18/2012   EF 01-02%, LV systolic mild-moderately reduced, mild-moderate mitral valve regurg, mild-moderate tricuspid valve regurg. NSR-LBBB with occas PVCs  . UPPER GASTROINTESTINAL ENDOSCOPY  07/03/2010   hiatal hernia  . VARICOSE VEIN SURGERY     Social History   Occupational History  . Occupation: retired  Tobacco Use  . Smoking status: Never Smoker  . Smokeless tobacco: Never Used  Vaping Use  . Vaping Use: Never used  Substance and Sexual Activity  . Alcohol use: No    Alcohol/week: 0.0 standard drinks  . Drug use: No  . Sexual  activity: Not on file

## 2020-08-06 ENCOUNTER — Other Ambulatory Visit: Payer: Self-pay | Admitting: Surgical

## 2020-08-06 ENCOUNTER — Other Ambulatory Visit: Payer: Self-pay

## 2020-08-06 ENCOUNTER — Ambulatory Visit (INDEPENDENT_AMBULATORY_CARE_PROVIDER_SITE_OTHER): Payer: PPO | Admitting: Pharmacist

## 2020-08-06 ENCOUNTER — Telehealth: Payer: Self-pay | Admitting: Surgical

## 2020-08-06 DIAGNOSIS — I10 Essential (primary) hypertension: Secondary | ICD-10-CM

## 2020-08-06 DIAGNOSIS — M810 Age-related osteoporosis without current pathological fracture: Secondary | ICD-10-CM | POA: Diagnosis not present

## 2020-08-06 DIAGNOSIS — Z9861 Coronary angioplasty status: Secondary | ICD-10-CM | POA: Diagnosis not present

## 2020-08-06 DIAGNOSIS — I2510 Atherosclerotic heart disease of native coronary artery without angina pectoris: Secondary | ICD-10-CM | POA: Diagnosis not present

## 2020-08-06 DIAGNOSIS — F411 Generalized anxiety disorder: Secondary | ICD-10-CM

## 2020-08-06 DIAGNOSIS — N1831 Chronic kidney disease, stage 3a: Secondary | ICD-10-CM | POA: Diagnosis not present

## 2020-08-06 DIAGNOSIS — E785 Hyperlipidemia, unspecified: Secondary | ICD-10-CM | POA: Diagnosis not present

## 2020-08-06 MED ORDER — BACLOFEN 10 MG PO TABS: 10.0000 mg | ORAL_TABLET | Freq: Two times a day (BID) | ORAL | 0 refills | Status: DC | PRN

## 2020-08-06 NOTE — Telephone Encounter (Signed)
Sent in refill

## 2020-08-06 NOTE — Telephone Encounter (Signed)
Patient called. Says it is ok for a RX to be called in for Baclofen 10mg .

## 2020-08-06 NOTE — Telephone Encounter (Signed)
Pls advise.  

## 2020-08-06 NOTE — Progress Notes (Signed)
Chronic Care Management Pharmacy Note  08/12/2020 Name:  Krystal [name] MRN:  [id number] DOB:  21-Aug-1929  Subjective: Krystal [name] is an 87 y.o. year old female who is a primary patient of Janith Lima, MD.  The CCM team was consulted for assistance with disease management and care coordination needs.    Engaged with patient by telephone for initial visit in response to provider referral for pharmacy case management and/or care coordination services.   Consent to Services:  The patient was given the following information about Chronic Care Management services today, agreed to services, and gave verbal consent: 1. CCM service includes personalized support from designated clinical staff supervised by the primary care provider, including individualized plan of care and coordination with other care providers 2. 24/7 contact phone numbers for assistance for urgent and routine care needs. 3. Service will only be billed when office clinical staff spend 20 minutes or more in a month to coordinate care. 4. Only one practitioner may furnish and bill the service in a calendar month. 5.The patient may stop CCM services at any time (effective at the end of the month) by phone call to the office staff. 6. The patient will be responsible for cost sharing (co-pay) of up to 20% of the service fee (after annual deductible is met). Patient agreed to services and consent obtained.  Patient Care Team: Janith Lima, MD as PCP - General (Internal Medicine) Lorretta Harp, MD as PCP - Cardiology (Cardiology) Krystal [name], [facility] as Pharmacist (Pharmacist)  Recent office visits: 07/27/20 Krystal [name] (PCP) - Low back pain. Ketorolac & Methylprednisolone acetate injections given.  Start Baclofen 10 mg.   06/08/20 [place] (PCP) - Viral Illness. Start butalbital-acetaminophen-caffeine 50-325-40 mg.   05/08/20 Ronnald Ramp (PCP) - Hypertension/Hyperlipidemia. No changes. F/u 6 months.  04/23/20 Valere Dross, [place] (PCP) -  Sinusitis. Start Azithromycin 250 mg & Prednisone 20 mg.  Recent consult visits: 08/03/20 PA Magnant (ortho surgery): f/u neck pain, rx'd prednisone course.  06/13/20 Dean (Ortho Surgeon) - Pain in right hand.  05/03/20 Dean (Ortho Surgeon) - Start Gabapentin 100 mg at bedtime.   02/03/20 Magnant (Ortho Surgeon) - Neuropathy. Stop Pregabalin. Start Gabapentin 100 mg. F/u 6 wks.  Hospital visits: None in previous 6 months  Objective:  Lab Results  Component Value Date   CREATININE 1.00 05/08/2020   BUN 24 (H) 05/08/2020   GFR 50.38 (L) 05/08/2020   GFRNONAA 72 04/28/2017   GFRAA 83 04/28/2017   NA 140 05/08/2020   K 3.8 05/08/2020   CALCIUM 9.0 05/08/2020   CO2 33 (H) 05/08/2020   GLUCOSE 84 05/08/2020    Lab Results  Component Value Date/Time   HGBA1C 6.1 05/08/2020 09:50 AM   HGBA1C 6.0 11/08/2018 02:36 PM   GFR 50.38 (L) 05/08/2020 09:50 AM   GFR 41.30 (L) 02/11/2019 01:54 PM    Last diabetic Eye exam: No results found for: HMDIABEYEEXA  Last diabetic Foot exam: No results found for: HMDIABFOOTEX   Lab Results  Component Value Date   CHOL 139 05/08/2020   HDL 47.90 05/08/2020   LDLCALC 62 05/08/2020   LDLDIRECT 160.4 12/19/2008   TRIG 146.0 05/08/2020   CHOLHDL 3 05/08/2020    Hepatic Function Latest Ref Rng & Units 05/08/2020 02/11/2019 11/08/2018  Total Protein 6.0 - 8.3 g/dL 6.4 6.8 6.7  Albumin 3.5 - 5.2 g/dL 3.9 4.2 4.2  AST 0 - 37 U/L _0 ALT 0 - 35 U/L _1 Alk  Phosphatase 39 - 117 U/L 81 92 94  Total Bilirubin 0.2 - 1.2 mg/dL 0.5 0.4 0.4  Bilirubin, Direct 0.0 - 0.3 mg/dL 0.1 - 0.1    Lab Results  Component Value Date/Time   TSH 1.56 05/08/2020 09:50 AM   TSH 1.62 11/08/2018 02:36 PM   FREET4 1.42 11/16/2012 08:10 AM    CBC Latest Ref Rng & Units 05/08/2020 02/11/2019 11/08/2018  WBC 4.0 - 10.5 K/uL 9.2 9.7 7.8  Hemoglobin 12.0 - 15.0 g/dL 13.9 14.6 14.0  Hematocrit 36.0 - 46.0 % 42.0 43.7 41.9  Platelets 150.0 - 400.0 K/uL 231.0 228.0  220.0    Lab Results  Component Value Date/Time   VD25OH 55.36 10/19/2017 02:40 PM   VD25OH 41 04/13/2012 11:46 AM   VD25OH 45 02/06/2010 08:06 PM    Clinical ASCVD: Yes  The ASCVD Risk score Mikey Bussing DC Jr., et al., 2013) failed to calculate for the following reasons:   The 2013 ASCVD risk score is only valid for ages 57 to 68    Depression screen PHQ 2/9 05/08/2020 12/29/2018 05/20/2017  Decreased Interest 0 0 0  Down, Depressed, Hopeless 0 0 0  PHQ - 2 Score 0 0 0  Some recent data might be hidden     Social History   Tobacco Use  Smoking Status Never Smoker  Smokeless Tobacco Never Used   BP Readings from Last 3 Encounters:  07/27/20 140/80  05/08/20 136/84  12/20/19 138/74   Pulse Readings from Last 3 Encounters:  07/27/20 84  05/08/20 64  12/20/19 82   Wt Readings from Last 3 Encounters:  07/27/20 156 lb (70.8 kg)  05/08/20 153 lb (69.4 kg)  12/20/19 153 lb (69.4 kg)   BMI Readings from Last 3 Encounters:  07/27/20 26.78 kg/m  05/08/20 26.26 kg/m  12/20/19 26.26 kg/m    Assessment/Interventions: Review of patient past medical history, allergies, medications, health status, including review of consultants reports, laboratory and other test data, was performed as part of comprehensive evaluation and provision of chronic care management services.   SDOH:  (Social Determinants of Health) assessments and interventions performed: Yes SDOH Interventions   Flowsheet Row Most Recent Value  SDOH Interventions   Financial Strain Interventions Intervention Not Indicated     SDOH Screenings   Alcohol Screen: Not on file  Depression (PHQ2-9): Low Risk   . PHQ-2 Score: 0  Financial Resource Strain: Low Risk   . Difficulty of Paying Living Expenses: Not hard at all  Food Insecurity: Not on file  Housing: Not on file  Physical Activity: Not on file  Social Connections: Not on file  Stress: Not on file  Tobacco Use: Low Risk   . Smoking Tobacco Use: Never Smoker   . Smokeless Tobacco Use: Never Used  Transportation Needs: Not on file    CCM Care Plan  Allergies  Allergen Reactions  . Atorvastatin Other (See Comments)    Muscle aches  . Simvastatin Other (See Comments)    Myalgias   . Alendronate Sodium     REACTION: pt states INTOL to Fosamax w/ esophagitis  . Cefdinir     REACTION: diarrhea  . Doxycycline     diarrhea  . Esomeprazole Magnesium     REACTION: pt states INTOL to Nexium  . Levofloxacin     REACTION: diarrhea  . Omeprazole     REACTION: pt states INTOL to Prilosec  . Other     Pneumonia vaccine---felt tired and achy and sick x 6  months to get over this.  . Penicillins Hives  . Amlodipine Other (See Comments)    Ankle edema  . Shellfish-Derived Products Hives, Nausea Only and Rash    Medications Reviewed Today    Reviewed by Krystal [name], [facility] (Pharmacist) on 08/09/20 at 37  Med List Status: <None>  Medication Order Taking? Sig Documenting Provider Last Dose Status Informant  acetaminophen (TYLENOL) 500 MG tablet [id number] Yes Take 500 mg by mouth every 4 (four) hours as needed for mild pain or headache. [provider] Taking Active Self  albuterol (VENTOLIN HFA) 108 (90 Base) MCG/ACT inhaler [id number] Yes TAKE 2 PUFFS BY MOUTH EVERY 6 HOURS AS NEEDED FOR WHEEZE OR SHORTNESS OF Cherly Beach, MD Taking Active   ALPRAZolam Duanne Moron) 0.5 MG tablet [id number] Yes Take 1 tablet (0.5 mg total) by mouth 3 (three) times daily as needed for anxiety. Janith Lima, MD Taking Active   ascorbic acid (VITAMIN C) 500 MG tablet [id number] Yes Take 500 mg by mouth daily. [provider] Taking Active   azelastine (ASTELIN) 0.1 % nasal spray [id number] Yes Place 2 sprays into both nostrils 2 (two) times daily. Use in each nostril as directed Janith Lima, MD Taking Active   baclofen (LIORESAL) 10 MG tablet [id number] Yes Take 1 tablet (10 mg total) by mouth 2 (two) times daily as needed for muscle  spasms. Magnant, Gerrianne Scale, PA-C Taking Active   calcium carbonate (OS-CAL) 600 MG TABS tablet [id number] Yes Take 600 mg by mouth 2 (two) times daily with a meal. [provider] Taking Active Self  cholecalciferol (VITAMIN D) 1000 UNITS tablet [id number] Yes Take 2,000 Units by mouth daily.  [provider] Taking Active Self           Med Note Rolan Bucco Aug 09, 2020 10:02 AM)    dicyclomine (BENTYL) 10 MG capsule [id number] Yes Take 1 capsule (10 mg total) by mouth 4 (four) times daily -  before meals and at bedtime. As needed for cramps Marrian Salvage, FNP Taking Active   docusate sodium (COLACE) 100 MG capsule [id number] Yes Take 100 mg by mouth every other day. [provider] Taking Active   fluticasone (FLONASE) 50 MCG/ACT nasal spray [id number] Yes Place 1 spray into both nostrils daily. Janith Lima, MD Taking Active   gabapentin (NEURONTIN) 100 MG capsule [id number] Yes 1 po q hs Meredith Pel, MD Taking Active   hydrochlorothiazide (HYDRODIURIL) 25 MG tablet [id number] Yes Take 1 tablet (25 mg total) by mouth daily. Janith Lima, MD Taking Active   lansoprazole (PREVACID) 15 MG capsule [id number] Yes Take 1 capsule (15 mg total) by mouth 2 (two) times daily before a meal. Zehr, Laban Emperor, PA-C Taking Active   loratadine (CLARITIN) 10 MG tablet [id number] Yes Take 1 tablet (10 mg total) by mouth daily. Janith Lima, MD Taking Active   losartan (COZAAR) 50 MG tablet [id number] Yes Take 1 tablet (50 mg total) by mouth daily. Janith Lima, MD Taking Active   metoprolol succinate (TOPROL-XL) 25 MG 24 hr tablet [id number] Yes Take 1 tablet (25 mg total) by mouth daily. Janith Lima, MD Taking Active   predniSONE (STERAPRED UNI-PAK 21 TAB) 5 MG (21) TBPK tablet [id number] Yes TAKE AS DIRECTED Magnant, Gerrianne Scale, PA-C Taking Active   PREMARIN vaginal cream [id number] Yes APPLY 1 APPLICATORFUL 2 3 TIMES A WEEK AS DIRECTED [provider] Taking Active  Probiotic Product (ALIGN) 4 MG CAPS [id number] Yes Take 4 mg by mouth as needed.  [provider] Taking Active Self  RESTASIS 0.05 % ophthalmic emulsion [id number] Yes  [provider] Taking Active   rosuvastatin (CRESTOR) 10 MG tablet [id number] Yes TAKE 1 TABLET BY MOUTH EVERY DAY Lorretta Harp, MD Taking Active         Discontinued 08/09/20 1002 (Completed Course)   triamcinolone cream (KENALOG) 0.5 % [id number] Yes Apply 1 application topically 3 (three) times daily. Janith Lima, MD Taking Active           Patient Active Problem List   Diagnosis Date Noted  . Viral illness 06/08/2020  . Encounter for general adult medical examination with abnormal findings 05/08/2020  . Primary osteoarthritis of first carpometacarpal joint of right hand 05/08/2020  . Restless legs syndrome (RLS) 08/22/2019  . Chronic peripheral neuropathic pain 08/22/2019  . Chronic renal disease, stage 3, moderately decreased glomerular filtration rate (GFR) between 30-59 mL/min/1.73 square meter ([place]) 11/08/2018  . Chronic idiopathic constipation 10/19/2017  . LBBB (left bundle branch block) 07/16/2016  . Cardiomyopathy ([place]) 07/16/2016  . Headache 01/17/2016  . Benign paroxysmal positional vertigo 01/17/2016  . Nocturnal foot cramps 01/17/2016  . Hyperglycemia 05/23/2015  . IBS (irritable bowel syndrome) 10/13/2011  . GERD (gastroesophageal reflux disease) 06/25/2010  . RENAL CYST 11/18/2007  . OSTEOPOROSIS 11/18/2007  . Allergic rhinitis 10/06/2007  . Hyperlipidemia with target LDL less than 70 07/19/2007  . GAD (generalized anxiety disorder) 07/19/2007  . Essential hypertension 07/19/2007  . CAD S/P percutaneous coronary angioplasty 07/19/2007  . VENOUS INSUFFICIENCY 07/19/2007  . Osteoarthritis 07/19/2007  . LOW BACK PAIN SYNDROME 07/19/2007    Immunization History  Administered Date(s) Administered  . Fluad Quad(high Dose 65+) 12/31/2018,  01/13/2020  . Influenza Split 02/04/2011, 01/22/2012  . Influenza Whole 01/12/2008, 02/08/2009, 02/05/2010  . Influenza, High Dose Seasonal PF 01/17/2016, 01/20/2017, 01/20/2018  . Influenza,inj,Quad PF,6+ Mos 01/06/2013, 01/20/2014  . Influenza-Unspecified 01/08/2015  . PFIZER(Purple Top)SARS-COV-2 Vaccination 04/10/2019, 05/01/2019  . Tdap 10/03/2015  . Zoster 04/16/2012  . Zoster Recombinat (Shingrix) 11/26/2018, 02/15/2019    Conditions to be addressed/monitored:  Hypertension, Hyperlipidemia, Coronary Artery Disease, GERD, Chronic Kidney Disease, Anxiety, Osteoporosis, Osteoarthritis and Allergic Rhinitis  Care Plan : CCM Pharmacy Care Plan  Updates made by Krystal [name], [place] since 08/12/2020 12:00 AM    Problem: Hypertension, Hyperlipidemia, Coronary Artery Disease, GERD, Chronic Kidney Disease, Anxiety, Osteoporosis, Osteoarthritis and Allergic Rhinitis   Priority: High    Long-Range Goal: Disease management   Start Date: 08/12/2020  Expected End Date: 08/12/2021  This Visit's Progress: On track  Priority: High  Note:   Current Barriers:  . Unable to independently monitor therapeutic efficacy  Pharmacist Clinical Goal(s):  Marland Kitchen Patient will achieve adherence to monitoring guidelines and medication adherence to achieve therapeutic efficacy through collaboration with PharmD and provider.   Interventions: . 1:1 collaboration with Janith Lima, MD regarding development and update of comprehensive plan of care as evidenced by provider attestation and co-signature . Inter-disciplinary care team collaboration (see longitudinal plan of care) . Comprehensive medication review performed; medication list updated in electronic medical record  Hypertension (BP goal <140/90) -Controlled - patient reports checking BP multiple times per day and BP fluctuates significantly but on average is at goal -Current treatment: . HCTZ 25 mg daily AM . Losartan 50 mg daily AM . Metoprolol  succinate 25 mg daily AM -Medications previously tried: amlodipine (edema)  -Current home  readings: 128/77-150/80 -Current exercise habits: walks daily -Denies hypotensive/hypertensive symptoms -Educated on BP goals and benefits of medications for prevention of heart attack, stroke and kidney damage; Importance of home blood pressure monitoring; Proper BP monitoring technique; -Counseled to monitor BP at home daily, document, and provide log at future appointments -Recommended to continue current medication  Hyperlipidemia / CAD: (LDL goal < 70) -Controlled - LDL is at goal; pt endorses compliance and denies issues -hx CAD, s/p DES 2003 -Current treatment: . Rosuvastatin 10 mg daily HS . Aspirin 81 mg daily -Medications previously tried: simvastatin  -Educated on Cholesterol goals;  Benefits of statin for ASCVD risk reduction; -Recommended to continue current medication  Depression/Anxiety (Goal: manage symptoms) -Controlled - pt tries to avoid using alprazolam, she has not used in > 3 months -Current treatment: . Alprazolam 0.5 mg TID prn -Medications previously tried/failed: lorazepam -PHQ9: 0 -GAD7: not on file -Connected with PCP for mental health support -Educated on when to use alprazolam; advised continuing to limit use -Recommended to continue current medication  Osteoporosis (Goal: prevent fractures) -Not ideally controlled - pt is a candidate for treatment but has failed bisphosphonate due to side effects and declined Prolia previously due to cost; she is not interested in starting therapy now -Last DEXA Scan: 03/2017  T-Score total hip: -3.1  T-Score lumbar spine: -2.9 -Patient is a candidate for pharmacologic treatment due to T-Score < -2.5 in total hip  and T-Score < -2.5 in lumbar spine -Current treatment  . Calcium 600 mg BID . Vitamin D 1000 IU daily -Medications previously tried: Forteo x 8 mos, alendronate (esophagitis), declined Prolia d/t copay -Recommend  [phone] units of vitamin D daily. Recommend 1200 mg of calcium daily from dietary and supplemental sources.  GERD / hernia (Goal: manage symptoms) -Controlled - Patient is satisfied with current regimen and denies issues -Current treatment  . Lansoprazole 15 mg AM -Recommended to continue current medication  IBS / CIC (Goal: manage symptoms) -Controlled - Patient is satisfied with current regimen and denies issues -Current treatment  . Dicyclomine 10 mg QID prn -rare use . Docusate 100 mg QOD . Probiotic daily -Recommended to continue current medication  Pain (Goal: manage symptoms) -Controlled - pt reports pain is relatively under control -osteoarthritis, low back pain, peripheral neuropathy -Current treatment  . Baclofen 10 mg BID prn . Gabapentin 100 mg HS . Tylenol 500 mg  -Recommended to continue current medication  Allergic rhinitis (Goal: manage symptoms) -Controlled - pt reports allergies worse in rainy weather  -Current treatment  . Albuterol HFA prn . Azelastine 0.1% nasal spray - not using . Fluticasone nasal spray . Levocetirizine 5 mg daily . Saline nasal spray  -Recommended to continue current medication  Health Maintenance -Vaccine gaps: none -Current therapy:  . Premarin vaginal cream 2-3 times weekly . Vitamin C 500 mg . Restasis 0.05% eye drops . Thera Tears . Triamcinolone 0.5% cream -Patient is satisfied with current therapy and denies issues -Recommended to continue current medication  Patient Goals/Self-Care Activities . Patient will:  - take medications as prescribed focus on medication adherence by routine check blood pressure daily, document, and provide at future appointments  Follow Up Plan: Telephone follow up appointment with care management team member scheduled for: 6 months      Medication Assistance: None required.  Patient affirms current coverage meets needs.  Patient's preferred pharmacy is:  [id number]: [phone]:  [phone]  Uses pill box? Yes Pt endorses 100% compliance  We discussed: Current pharmacy is preferred with insurance plan and patient is satisfied with pharmacy services Patient decided to: Continue current medication management strategy  Care Plan and Follow Up Patient Decision:  Patient agrees to Care Plan and Follow-up.  Plan: Telephone follow up appointment with care management team member scheduled for:  6 months  Charlene Brooke, PharmD, [place], CPP Clinical Pharmacist [facility] [phone]

## 2020-08-12 NOTE — Patient Instructions (Addendum)
Visit Information  Phone number for Pharmacist: [phone]  Thank you for meeting with me to discuss your medications! I look forward to working with you to achieve your health care goals. Below is a summary of what we talked about during the visit:  Goals Addressed            This Visit's Progress   . Manage My Medicine       Timeframe:  Long-Range Goal Priority:  Medium Start Date:    08/06/20                         Expected End Date:   08/06/21                    Follow Up Date 02/27/21   - call for medicine refill 2 or 3 days before it runs out - call if I am sick and can't take my medicine - keep a list of all the medicines I take; vitamins and herbals too - use a pillbox to sort medicine    Why is this important?   . These steps will help you keep on track with your medicines.   Notes:        Ms. Vecchiarelli was given information about Chronic Care Management services today including:  1. CCM service includes personalized support from designated clinical staff supervised by her physician, including individualized plan of care and coordination with other care providers 2. 24/7 contact phone numbers for assistance for urgent and routine care needs. 3. Standard insurance, coinsurance, copays and deductibles apply for chronic care management only during months in which we provide at least 20 minutes of these services. Most insurances cover these services at 100%, however patients may be responsible for any copay, coinsurance and/or deductible if applicable. This service may help you avoid the need for more expensive face-to-face services. 4. Only one practitioner may furnish and bill the service in a calendar month. 5. The patient may stop CCM services at any time (effective at the end of the month) by phone call to the office staff.  Patient agreed to services and verbal consent obtained.   Patient verbalizes understanding of instructions provided today and agrees to view in  [place].  Telephone follow up appointment with pharmacy team member scheduled for: 6 months  Charlene Brooke, PharmD, BCACP, CPP Clinical Pharmacist [facility] protect organs, store calcium, anchor muscles, and support the whole body. Keeping your bones strong is important, especially as you get older. You can take actions to help keep your bones strong and healthy. Why is keeping my bones healthy important? Keeping your bones healthy is important because your body constantly replaces bone cells. Cells get old, and new cells take their place. As we age, we lose bone cells because the body may not be able to make enough new cells to replace the old cells. The amount of bone cells and bone tissue you have is referred to as bone mass. The higher your bone mass, the stronger your bones. The aging process leads to an overall loss of bone mass in the body, which can increase the likelihood of:  Joint pain and stiffness.  Broken bones.  A condition in which the bones become weak and brittle (osteoporosis). A large decline in bone mass occurs in older adults. In women, it occurs about the time of menopause.   What actions can I take  to keep my bones healthy? Good health habits are important for maintaining healthy bones. This includes eating nutritious foods and exercising regularly. To have healthy bones, you need to get enough of the right minerals and vitamins. Most nutrition experts recommend getting these nutrients from the foods that you eat. In some cases, taking supplements may also be recommended. Doing certain types of exercise is also important for bone health. What are the nutritional recommendations for healthy bones? Eating a well-balanced diet with plenty of calcium and vitamin D will help to protect your bones. Nutritional recommendations vary from person to person. Ask your health care provider what is healthy for you. Here  are some general guidelines. Get enough calcium Calcium is the most important (essential) mineral for bone health. Most people can get enough calcium from their diet, but supplements may be recommended for people who are at risk for osteoporosis. Good sources of calcium include:  Dairy products, such as low-fat or nonfat milk, cheese, and yogurt.  Dark green leafy vegetables, such as bok choy and broccoli.  Calcium-fortified foods, such as orange juice, cereal, bread, soy beverages, and tofu products.  Nuts, such as almonds. Follow these recommended amounts for daily calcium intake:  Children, age 69-3: 700 mg.  Children, age 30-8: 1,000 mg.  Children, age 69-13: 1,300 mg.  Teens, age 303-18: 1,300 mg.  Adults, age 694-50: 1,000 mg.  Adults, age 697-70: ? Men: 1,000 mg. ? Women: 1,200 mg.  Adults, age 26 or older: 1,200 mg.  Pregnant and breastfeeding females: ? Teens: 1,300 mg. ? Adults: 1,000 mg. Get enough vitamin D Vitamin D is the most essential vitamin for bone health. It helps the body absorb calcium. Sunlight stimulates the skin to make vitamin D, so be sure to get enough sunlight. If you live in a cold climate or you do not get outside often, your health care provider may recommend that you take vitamin D supplements. Good sources of vitamin D in your diet include:  Egg yolks.  Saltwater fish.  Milk and cereal fortified with vitamin D. Follow these recommended amounts for daily vitamin D intake:  Children and teens, age 69-18: 600 international units.  Adults, age 73 or younger: 400-800 international units.  Adults, age 306 or older: 800-1,000 international units. Get other important nutrients Other nutrients that are important for bone health include:  Phosphorus. This mineral is found in meat, poultry, dairy foods, nuts, and legumes. The recommended daily intake for adult men and adult women is 700 mg.  Magnesium. This mineral is found in seeds, nuts, dark green  vegetables, and legumes. The recommended daily intake for adult men is 400-420 mg. For adult women, it is 310-320 mg.  Vitamin K. This vitamin is found in green leafy vegetables. The recommended daily intake is 120 mg for adult men and 90 mg for adult women.   What type of physical activity is best for building and maintaining healthy bones? Weight-bearing and strength-building activities are important for building and maintaining healthy bones. Weight-bearing activities cause muscles and bones to work against gravity. Strength-building activities increase the strength of the muscles that support bones. Weight-bearing and muscle-building activities include:  Walking and hiking.  Jogging and running.  Dancing.  Gym exercises.  Lifting weights.  Tennis and racquetball.  Climbing stairs.  Aerobics. Adults should get at least 30 minutes of moderate physical activity on most days. Children should get at least 60 minutes of moderate physical activity on most days. Ask your health care  provider what type of exercise is best for you.   How can I find out if my bone mass is low? Bone mass can be measured with an X-ray test called a bone mineral density (BMD) test. This test is recommended for all women who are age 71 or older. It may also be recommended for:  Men who are age 18 or older.  People who are at risk for osteoporosis because of: ? Having bones that break easily. ? Having a long-term disease that weakens bones, such as kidney disease or rheumatoid arthritis. ? Having menopause earlier than normal. ? Taking medicine that weakens bones, such as steroids, thyroid hormones, or hormone treatment for breast cancer or prostate cancer. ? Smoking. ? Drinking three or more alcoholic drinks a day. If you find that you have a low bone mass, you may be able to prevent osteoporosis or further bone loss by changing your diet and lifestyle. Where can I find more information? For more  information, check out the following websites:  [place]: AviationTales.fr  [employer] of Health: www.bones.SouthExposed.es  International Osteoporosis Foundation: Administrator.iofbonehealth.org Summary  The aging process leads to an overall loss of bone mass in the body, which can increase the likelihood of broken bones and osteoporosis.  Eating a well-balanced diet with plenty of calcium and vitamin D will help to protect your bones.  Weight-bearing and strength-building activities are also important for building and maintaining strong bones.  Bone mass can be measured with an X-ray test called a bone mineral density (BMD) test. This information is not intended to replace advice given to you by your health care provider. Make sure you discuss any questions you have with your health care provider. Document Revised: 04/13/2017 Document Reviewed: 04/13/2017 Elsevier Patient Education  2021 [employer].

## 2020-08-14 ENCOUNTER — Other Ambulatory Visit: Payer: Self-pay

## 2020-08-14 ENCOUNTER — Encounter: Payer: Self-pay | Admitting: Internal Medicine

## 2020-08-14 ENCOUNTER — Ambulatory Visit (INDEPENDENT_AMBULATORY_CARE_PROVIDER_SITE_OTHER): Payer: PPO | Admitting: Internal Medicine

## 2020-08-14 VITALS — BP 128/78 | HR 73 | Temp 98.3°F | Ht 64.0 in | Wt 152.0 lb

## 2020-08-14 DIAGNOSIS — R110 Nausea: Secondary | ICD-10-CM | POA: Diagnosis not present

## 2020-08-14 DIAGNOSIS — R1033 Periumbilical pain: Secondary | ICD-10-CM | POA: Diagnosis not present

## 2020-08-14 MED ORDER — ONDANSETRON 4 MG PO TBDP: 4.0000 mg | ORAL_TABLET | Freq: Three times a day (TID) | ORAL | 0 refills | Status: DC | PRN

## 2020-08-14 NOTE — Patient Instructions (Addendum)
   Hold the baclofen for a few days.     Increase the prevacid( lansoprazole) to twice a day and once all your stomach symptoms improve try going back to once a day.    Take zofran ( for your nausea) three times a day as needed.   Take the dicyclomine three times a day before meals and before bedtime regularly for 3-4 days and then try stopping it and taking it as needed.       Please call if there is no improvement in your symptoms.

## 2020-08-14 NOTE — Progress Notes (Signed)
Subjective:    Patient ID: Krystal [name], female    DOB: [birth year], 87 y.o.   MRN: [id number]  HPI The patient is here for an acute visit.    2 weeks of increased gas, diarrhea and nausea.  She has cramping in her abdomen - center of her abdomen near her umbilicus.  The pain can last a couple of hours.   The diarrhea has stopped.    She denies GERD.  She takes prevacid once a day.    She tried the dicyclomine and it helps, but the cramping comes back.  Takes gas pills otc   Baclofen is a new medication for her and she states it does cause some drowsiness first thing in the morning.  She was recently on prednisone taper.     Medications and allergies reviewed with patient and updated if appropriate.  Patient Active Problem List   Diagnosis Date Noted  . Viral illness 06/08/2020  . Encounter for general adult medical examination with abnormal findings 05/08/2020  . Primary osteoarthritis of first carpometacarpal joint of right hand 05/08/2020  . Restless legs syndrome (RLS) 08/22/2019  . Chronic peripheral neuropathic pain 08/22/2019  . Chronic renal disease, stage 3, moderately decreased glomerular filtration rate (GFR) between 30-59 mL/min/1.73 square meter ([place]) 11/08/2018  . Chronic idiopathic constipation 10/19/2017  . LBBB (left bundle branch block) 07/16/2016  . Cardiomyopathy ([place]) 07/16/2016  . Headache 01/17/2016  . Benign paroxysmal positional vertigo 01/17/2016  . Nocturnal foot cramps 01/17/2016  . Hyperglycemia 05/23/2015  . IBS (irritable bowel syndrome) 10/13/2011  . GERD (gastroesophageal reflux disease) 06/25/2010  . RENAL CYST 11/18/2007  . OSTEOPOROSIS 11/18/2007  . Allergic rhinitis 10/06/2007  . Hyperlipidemia with target LDL less than 70 07/19/2007  . GAD (generalized anxiety disorder) 07/19/2007  . Essential hypertension 07/19/2007  . CAD S/P percutaneous coronary angioplasty 07/19/2007  . VENOUS INSUFFICIENCY 07/19/2007  . Osteoarthritis  07/19/2007  . LOW BACK PAIN SYNDROME 07/19/2007    Current Outpatient Medications on File Prior to Visit  Medication Sig Dispense Refill  . acetaminophen (TYLENOL) 500 MG tablet Take 500 mg by mouth every 4 (four) hours as needed for mild pain or headache.    . albuterol (VENTOLIN HFA) 108 (90 Base) MCG/ACT inhaler TAKE 2 PUFFS BY MOUTH EVERY 6 HOURS AS NEEDED FOR WHEEZE OR SHORTNESS OF BREATH 8.5 each 1  . ALPRAZolam (XANAX) 0.5 MG tablet Take 1 tablet (0.5 mg total) by mouth 3 (three) times daily as needed for anxiety. 270 tablet 1  . ascorbic acid (VITAMIN C) 500 MG tablet Take 500 mg by mouth daily.    Marland Kitchen aspirin EC 81 MG tablet Take 81 mg by mouth daily. Swallow whole.    Marland Kitchen azelastine (ASTELIN) 0.1 % nasal spray Place 2 sprays into both nostrils 2 (two) times daily. Use in each nostril as directed 90 mL 1  . baclofen (LIORESAL) 10 MG tablet Take 1 tablet (10 mg total) by mouth 2 (two) times daily as needed for muscle spasms. 30 each 0  . calcium carbonate (OS-CAL) 600 MG TABS tablet Take 600 mg by mouth 2 (two) times daily with a meal.    . cholecalciferol (VITAMIN D) 1000 UNITS tablet Take 2,000 Units by mouth daily.     Marland Kitchen dicyclomine (BENTYL) 10 MG capsule Take 1 capsule (10 mg total) by mouth 4 (four) times daily -  before meals and at bedtime. As needed for cramps 60 capsule 0  . docusate sodium (COLACE)  100 MG capsule Take 100 mg by mouth every other day.    . fluticasone (FLONASE) 50 MCG/ACT nasal spray Place 1 spray into both nostrils daily. 48 mL 1  . gabapentin (NEURONTIN) 100 MG capsule 1 po q hs 60 capsule 4  . hydrochlorothiazide (HYDRODIURIL) 25 MG tablet Take 1 tablet (25 mg total) by mouth daily. 90 tablet 1  . lansoprazole (PREVACID) 15 MG capsule Take 1 capsule (15 mg total) by mouth 2 (two) times daily before a meal. 60 capsule 11  . levocetirizine (XYZAL) 5 MG tablet SMARTSIG:1 Tablet(s) By Mouth Every Evening    . loratadine (CLARITIN) 10 MG tablet Take 1 tablet (10 mg  total) by mouth daily. 90 tablet 3  . losartan (COZAAR) 50 MG tablet Take 1 tablet (50 mg total) by mouth daily. 90 tablet 1  . metoprolol succinate (TOPROL-XL) 25 MG 24 hr tablet Take 1 tablet (25 mg total) by mouth daily. 90 tablet 1  . predniSONE (STERAPRED UNI-PAK 21 TAB) 5 MG (21) TBPK tablet TAKE AS DIRECTED 21 tablet 0  . PREMARIN vaginal cream APPLY 1 APPLICATORFUL 2 3 TIMES A WEEK AS DIRECTED    . Probiotic Product (ALIGN) 4 MG CAPS Take 4 mg by mouth as needed.     . RESTASIS 0.05 % ophthalmic emulsion     . rosuvastatin (CRESTOR) 10 MG tablet TAKE 1 TABLET BY MOUTH EVERY DAY 90 tablet 3  . triamcinolone cream (KENALOG) 0.5 % Apply 1 application topically 3 (three) times daily. 30 g 0   No current facility-administered medications on file prior to visit.    Past Medical History:  Diagnosis Date  . Adenomatous colon polyp   . Allergy    seasonal  . Anemia    pt. denies  . Anxiety   . CAD (coronary artery disease)   . Cataract    cataracts removed left eye.  . Chronic lower back pain   . Diverticulosis   . GERD (gastroesophageal reflux disease)   . Headache(784.0)   . Hiatal hernia   . Hyperlipidemia   . Hypertension   . IBS (irritable bowel syndrome) 10/13/2011  . Osteoarthritis   . Osteoporosis   . Renal cysts, acquired, bilateral   . SBO (small bowel obstruction) ([place]) 09/05/2015  . Venous insufficiency     Past Surgical History:  Procedure Laterality Date  . ABDOMINAL HYSTERECTOMY    . BLADDER REPAIR    . CARDIAC CATHETERIZATION  01/12/2002   90% stenosis of the left circumflex, stented with a 3x22mm Cypher stent, resulting in reduction of 90% to 10%   . CARDIOVASCULAR STRESS TEST  03/22/2007   Mild inferolateral thinning toward the apex without significant ischemia. Nondiagnostic electrocardiogram.  . CATARACT EXTRACTION     bilateral  . COLONOSCOPY  08/17/2008   adenomatous polyp, diverticulosis, external hemorrhoids  . COLONOSCOPY W/ BIOPSIES      multiple   . LEFT LOWER EXTREMITY VENOUS DOPPLER  06/18/2011   No evidence of left lower extremity DVT  . TRANSTHORACIC ECHOCARDIOGRAM  11/18/2012   EF 87-86%, LV systolic mild-moderately reduced, mild-moderate mitral valve regurg, mild-moderate tricuspid valve regurg. NSR-LBBB with occas PVCs  . UPPER GASTROINTESTINAL ENDOSCOPY  07/03/2010   hiatal hernia  . VARICOSE VEIN SURGERY      Social History   Socioeconomic History  . Marital status: Married    Spouse name: [name]  . Number of children: Not on file  . Years of education: Not on file  . Highest education  level: Not on file  Occupational History  . Occupation: retired  Tobacco Use  . Smoking status: Never Smoker  . Smokeless tobacco: Never Used  Vaping Use  . Vaping Use: Never used  Substance and Sexual Activity  . Alcohol use: No    Alcohol/week: 0.0 standard drinks  . Drug use: No  . Sexual activity: Not on file  Other Topics Concern  . Not on file  Social History Narrative   Married to [employer] who has congestive heart failure problems   She is retired never smoker rare alcohol   Social Determinants of Radio broadcast assistant Strain: Low Risk   . Difficulty of Paying Living Expenses: Not hard at all  Food Insecurity: Not on file  Transportation Needs: Not on file  Physical Activity: Not on file  Stress: Not on file  Social Connections: Not on file    Family History  Problem Relation Age of Onset  . Heart disease Father   . Stroke Sister   . Colon cancer Neg Hx     Review of Systems  Constitutional: Negative for chills and fever.  Respiratory: Negative for cough, shortness of breath and wheezing.   Cardiovascular: Negative for chest pain.  Gastrointestinal: Positive for abdominal pain (central cramping), diarrhea (one day, resolved) and nausea. Negative for blood in stool, constipation and vomiting.  Genitourinary: Negative for dysuria, frequency and hematuria.  Neurological: Negative for  light-headedness and headaches.       Objective:   Vitals:   08/14/20 1432  BP: 128/78  Pulse: 73  Temp: 98.3 F (36.8 C)  SpO2: 97%   BP Readings from Last 3 Encounters:  08/14/20 128/78  07/27/20 140/80  05/08/20 136/84   Wt Readings from Last 3 Encounters:  08/14/20 152 lb (68.9 kg)  07/27/20 156 lb (70.8 kg)  05/08/20 153 lb (69.4 kg)   Body mass index is 26.09 kg/m.   Physical Exam Constitutional:      General: She is not in acute distress.    Appearance: She is well-developed. She is not ill-appearing.  HENT:     Head: Normocephalic and atraumatic.  Abdominal:     Palpations: Abdomen is soft.     Tenderness: There is abdominal tenderness (mild - just above umbilicus). There is no right CVA tenderness, left CVA tenderness, guarding or rebound. Negative signs include Murphy's sign and McBurney's sign.     Hernia: No hernia is present.  Skin:    General: Skin is warm and dry.  Neurological:     Mental Status: She is alert.            Assessment & Plan:    Periumbilical abdominal pain - mild, nausea: Acute - started 2 weeks ago initially had diarrhea but that has resolved H/o IBS Has increased gas Symptoms have improved, but still present Increase prevacid to BID and once symptoms improve decrease back to once daily zofran 4 mg ODT  TID prn Start dicyclomine 10 mg TID AC and HS Hold baclofen - ? Contributing to nausea If no improvement she will let us know  - may need further evaluation    This visit occurred during the SARS-CoV-2 public health emergency.  Safety protocols were in place, including screening questions prior to the visit, additional usage of staff PPE, and extensive cleaning of exam room while observing appropriate contact time as indicated for disinfecting solutions.

## 2020-08-15 ENCOUNTER — Other Ambulatory Visit: Payer: Self-pay | Admitting: Surgical

## 2020-08-30 ENCOUNTER — Other Ambulatory Visit: Payer: Self-pay | Admitting: Surgical

## 2020-09-04 ENCOUNTER — Encounter: Payer: Self-pay | Admitting: Internal Medicine

## 2020-09-04 ENCOUNTER — Other Ambulatory Visit: Payer: Self-pay

## 2020-09-04 ENCOUNTER — Ambulatory Visit (INDEPENDENT_AMBULATORY_CARE_PROVIDER_SITE_OTHER): Payer: PPO | Admitting: Internal Medicine

## 2020-09-04 DIAGNOSIS — M25511 Pain in right shoulder: Secondary | ICD-10-CM | POA: Insufficient documentation

## 2020-09-04 MED ORDER — BACLOFEN 10 MG PO TABS: 10.0000 mg | ORAL_TABLET | Freq: Two times a day (BID) | ORAL | 1 refills | Status: DC

## 2020-09-04 MED ORDER — PREDNISONE 20 MG PO TABS: 40.0000 mg | ORAL_TABLET | Freq: Every day | ORAL | 0 refills | Status: DC

## 2020-09-04 NOTE — Assessment & Plan Note (Signed)
Suspect flare of underlying OA. Rx baclofen and 5 day prednisone course.

## 2020-09-04 NOTE — Patient Instructions (Signed)
We have in prednisone to take 2 pills daily for 5 days to help the neck and back.  We have also refilled the baclofen to take for the pain.

## 2020-09-04 NOTE — Progress Notes (Signed)
   Subjective:   Patient ID: Krystal [name], female    DOB: 1930-03-28, 87 y.o.   MRN: [id number]  HPI The patent is an 87 YO female coming in for right shoulder pain. Recent x-ray cervical beginning of May with severe OA in the entire cervical region. She was carrying luggage last week and felt that this would improve however it did not. She has taken baclofen in the past and this is helpful but she is out.   Review of Systems  Constitutional: Negative.   HENT: Negative.   Eyes: Negative.   Respiratory: Negative for cough, chest tightness and shortness of breath.   Cardiovascular: Negative for chest pain, palpitations and leg swelling.  Gastrointestinal: Negative for abdominal distention, abdominal pain, constipation, diarrhea, nausea and vomiting.  Musculoskeletal: Positive for arthralgias and myalgias.  Skin: Negative.   Neurological: Negative.   Psychiatric/Behavioral: Negative.     Objective:  Physical Exam Constitutional:      Appearance: She is well-developed.  HENT:     Head: Normocephalic and atraumatic.  Cardiovascular:     Rate and Rhythm: Normal rate and regular rhythm.  Pulmonary:     Effort: Pulmonary effort is normal. No respiratory distress.     Breath sounds: Normal breath sounds. No wheezing or rales.  Abdominal:     General: Bowel sounds are normal. There is no distension.     Palpations: Abdomen is soft.     Tenderness: There is no abdominal tenderness. There is no rebound.  Musculoskeletal:        General: Tenderness present.     Cervical back: Normal range of motion.  Skin:    General: Skin is warm and dry.  Neurological:     Mental Status: She is alert and oriented to person, place, and time.     Coordination: Coordination normal.     Vitals:   09/04/20 1515  BP: 132/78  Pulse: 74  Resp: 18  Temp: 98.4 F (36.9 C)  TempSrc: Oral  SpO2: 96%  Weight: 153 lb 3.2 oz (69.5 kg)  Height: 5\' 4"  (1.626 m)    This visit occurred during the  SARS-CoV-2 public health emergency.  Safety protocols were in place, including screening questions prior to the visit, additional usage of staff PPE, and extensive cleaning of exam room while observing appropriate contact time as indicated for disinfecting solutions.   Assessment & Plan:

## 2020-09-06 ENCOUNTER — Ambulatory Visit: Payer: PPO | Admitting: Physician Assistant

## 2020-09-19 ENCOUNTER — Telehealth: Payer: Self-pay | Admitting: Orthopedic Surgery

## 2020-09-19 DIAGNOSIS — M542 Cervicalgia: Secondary | ICD-10-CM

## 2020-09-19 NOTE — Telephone Encounter (Signed)
Pt called asking for a CB in regards to some x-rays she had done awhile ago; she states she thinks she has arthritis in her R arm but she was wanting to have that explained a little deeper. Pt is also wanting to discuss the possibility of having something called in for pain since her shoulder pain has been keeping her up.   [phone]

## 2020-09-20 NOTE — Telephone Encounter (Signed)
She has severe neck arthritis which was causing radicular arm pain at her last appointment. Judging by her recent visit to her PCP and this message, it does not seem to be improved.  I'd say options would be between follow-up with Dr. Marlou Sa or obtaining MRI c-spine with follow-up after that to try some neck injections

## 2020-09-21 ENCOUNTER — Other Ambulatory Visit: Payer: Self-pay | Admitting: Surgical

## 2020-09-21 MED ORDER — ACETAMINOPHEN-CODEINE #3 300-30 MG PO TABS: 1.0000 | ORAL_TABLET | Freq: Every day | ORAL | 0 refills | Status: DC | PRN

## 2020-09-21 NOTE — Telephone Encounter (Signed)
Should be okay as by the time she has the injection it will be fairly far out from her last steroid dose (~4-5 weeks from now is my guess) and the actual injection is a lower dose than the oral with less systemic absorption.  I'll send in a prescription for Tylenol #3 to help with pain in the meantime

## 2020-09-21 NOTE — Telephone Encounter (Signed)
IC advised per below. Patient verbalized understanding

## 2020-10-24 ENCOUNTER — Other Ambulatory Visit: Payer: Self-pay

## 2020-10-24 ENCOUNTER — Ambulatory Visit (INDEPENDENT_AMBULATORY_CARE_PROVIDER_SITE_OTHER): Payer: PPO | Admitting: Internal Medicine

## 2020-10-24 ENCOUNTER — Encounter: Payer: Self-pay | Admitting: Internal Medicine

## 2020-10-24 VITALS — BP 134/80 | HR 78 | Temp 98.1°F | Ht 64.0 in | Wt 156.0 lb

## 2020-10-24 DIAGNOSIS — R739 Hyperglycemia, unspecified: Secondary | ICD-10-CM

## 2020-10-24 DIAGNOSIS — I10 Essential (primary) hypertension: Secondary | ICD-10-CM | POA: Diagnosis not present

## 2020-10-24 DIAGNOSIS — M25542 Pain in joints of left hand: Secondary | ICD-10-CM | POA: Insufficient documentation

## 2020-10-24 MED ORDER — PREDNISONE 10 MG PO TABS: ORAL_TABLET | ORAL | 0 refills | Status: DC

## 2020-10-24 NOTE — Progress Notes (Signed)
Chief Complaint: follow up left thumb CMC pain and swelling       HPI:  Krystal [name] is a 87 y.o. female here with c/o 1 wk onset left thumb CMC pain and swelling without fever, trauma, overuse or hx of gout.  Much worse to use the thumb in flexion and extension, better to rest, moderate to severe, constant, nothing else makes better or worse.  Pt denies chest pain, increased sob or doe, wheezing, orthopnea, PND, increased LE swelling, palpitations, dizziness or syncope.   Pt denies polydipsia, polyuria, or new focal neuro s/s.         Wt Readings from Last 3 Encounters:  10/24/20 156 lb (70.8 kg)  09/04/20 153 lb 3.2 oz (69.5 kg)  08/14/20 152 lb (68.9 kg)   BP Readings from Last 3 Encounters:  10/24/20 134/80  09/04/20 132/78  08/14/20 128/78         Past Medical History:  Diagnosis Date   Adenomatous colon polyp    Allergy    seasonal   Anemia    pt. denies   Anxiety    CAD (coronary artery disease)    Cataract    cataracts removed left eye.   Chronic lower back pain    Diverticulosis    GERD (gastroesophageal reflux disease)    Headache(784.0)    Hiatal hernia    Hyperlipidemia    Hypertension    IBS (irritable bowel syndrome) 10/13/2011   Osteoarthritis    Osteoporosis    Renal cysts, acquired, bilateral    SBO (small bowel obstruction) ([place]) 09/05/2015   Venous insufficiency    Past Surgical History:  Procedure Laterality Date   ABDOMINAL HYSTERECTOMY     BLADDER REPAIR     CARDIAC CATHETERIZATION  01/12/2002   90% stenosis of the left circumflex, stented with a 3x32m Cypher stent, resulting in reduction of 90% to 10%    CARDIOVASCULAR STRESS TEST  03/22/2007   Mild inferolateral thinning toward the apex without significant ischemia. Nondiagnostic electrocardiogram.   CATARACT EXTRACTION     bilateral   COLONOSCOPY  08/17/2008   adenomatous polyp, diverticulosis, external hemorrhoids   COLONOSCOPY W/ BIOPSIES     multiple    LEFT LOWER EXTREMITY  VENOUS DOPPLER  06/18/2011   No evidence of left lower extremity DVT   TRANSTHORACIC ECHOCARDIOGRAM  11/18/2012   EF [id number] LV systolic mild-moderately reduced, mild-moderate mitral valve regurg, mild-moderate tricuspid valve regurg. NSR-LBBB with occas PVCs   UPPER GASTROINTESTINAL ENDOSCOPY  07/03/2010   hiatal hernia   VARICOSE VEIN SURGERY      reports that she has never smoked. She has never used smokeless tobacco. She reports that she does not drink alcohol and does not use drugs. family history includes Heart disease in her father; Stroke in her sister. Allergies  Allergen Reactions   Atorvastatin Other (See Comments)    Muscle aches   Simvastatin Other (See Comments)    Myalgias    Alendronate Sodium     REACTION: pt states INTOL to Fosamax w/ esophagitis   Cefdinir     REACTION: diarrhea   Doxycycline     diarrhea   Esomeprazole Magnesium     REACTION: pt states INTOL to Nexium   Levofloxacin     REACTION: diarrhea   Omeprazole     REACTION: pt states INTOL to Prilosec   Other     Pneumonia vaccine---felt tired and achy and sick x 6 months to get  over this.   Penicillins Hives   Amlodipine Other (See Comments)    Ankle edema   Shellfish-Derived Products Hives, Nausea Only and Rash   Current Outpatient Medications on File Prior to Visit  Medication Sig Dispense Refill   acetaminophen (TYLENOL) 500 MG tablet Take 500 mg by mouth every 4 (four) hours as needed for mild pain or headache.     acetaminophen-codeine (TYLENOL #3) 300-30 MG tablet Take 1 tablet by mouth daily as needed for moderate pain. 20 tablet 0   albuterol (VENTOLIN HFA) 108 (90 Base) MCG/ACT inhaler TAKE 2 PUFFS BY MOUTH EVERY 6 HOURS AS NEEDED FOR WHEEZE OR SHORTNESS OF BREATH 8.5 each 1   ALPRAZolam (XANAX) 0.5 MG tablet Take 1 tablet (0.5 mg total) by mouth 3 (three) times daily as needed for anxiety. 270 tablet 1   ascorbic acid (VITAMIN C) 500 MG tablet Take 500 mg by mouth daily.     aspirin EC 81  MG tablet Take 81 mg by mouth daily. Swallow whole.     azelastine (ASTELIN) 0.1 % nasal spray Place 2 sprays into both nostrils 2 (two) times daily. Use in each nostril as directed 90 mL 1   baclofen (LIORESAL) 10 MG tablet Take 1 tablet (10 mg total) by mouth 2 (two) times daily. 180 tablet 1   calcium carbonate (OS-CAL) 600 MG TABS tablet Take 600 mg by mouth 2 (two) times daily with a meal.     cholecalciferol (VITAMIN D) 1000 UNITS tablet Take 2,000 Units by mouth daily.      dicyclomine (BENTYL) 10 MG capsule Take 1 capsule (10 mg total) by mouth 4 (four) times daily -  before meals and at bedtime. As needed for cramps 60 capsule 0   docusate sodium (COLACE) 100 MG capsule Take 100 mg by mouth every other day.     fluticasone (FLONASE) 50 MCG/ACT nasal spray Place 1 spray into both nostrils daily. 48 mL 1   gabapentin (NEURONTIN) 100 MG capsule 1 po q hs 60 capsule 4   hydrochlorothiazide (HYDRODIURIL) 25 MG tablet Take 1 tablet (25 mg total) by mouth daily. 90 tablet 1   lansoprazole (PREVACID) 15 MG capsule Take 1 capsule (15 mg total) by mouth 2 (two) times daily before a meal. 60 capsule 11   levocetirizine (XYZAL) 5 MG tablet SMARTSIG:1 Tablet(s) By Mouth Every Evening     loratadine (CLARITIN) 10 MG tablet Take 1 tablet (10 mg total) by mouth daily. 90 tablet 3   losartan (COZAAR) 50 MG tablet Take 1 tablet (50 mg total) by mouth daily. 90 tablet 1   metoprolol succinate (TOPROL-XL) 25 MG 24 hr tablet Take 1 tablet (25 mg total) by mouth daily. 90 tablet 1   ondansetron (ZOFRAN ODT) 4 MG disintegrating tablet Take 1 tablet (4 mg total) by mouth every 8 (eight) hours as needed for nausea or vomiting. 20 tablet 0   PREMARIN vaginal cream APPLY 1 APPLICATORFUL 2 3 TIMES A WEEK AS DIRECTED     Probiotic Product (ALIGN) 4 MG CAPS Take 4 mg by mouth as needed.      RESTASIS 0.05 % ophthalmic emulsion      rosuvastatin (CRESTOR) 10 MG tablet TAKE 1 TABLET BY MOUTH EVERY DAY 90 tablet 3    triamcinolone cream (KENALOG) 0.5 % Apply 1 application topically 3 (three) times daily. 30 g 0   No current facility-administered medications on file prior to visit.        ROS:  All others reviewed and negative.  Objective        PE:  BP 134/80   Pulse 78   Temp 98.1 F (36.7 C) (Oral)   Ht '5\' 4"'$  (1.626 m)   Wt 156 lb (70.8 kg)   SpO2 95%   BMI 26.78 kg/m                 Constitutional: Pt appears in NAD               HENT: Head: NCAT.                Right Ear: External ear normal.                 Left Ear: External ear normal.                Eyes: . Pupils are equal, round, and reactive to light. Conjunctivae and EOM are normal               Nose: without d/c or deformity               Neck: Neck supple. Gross normal ROM               Cardiovascular: Normal rate and regular rhythm.                 Pulmonary/Chest: Effort normal and breath sounds without rales or wheezing.                Abd:  Soft, NT, ND, + BS, no organomegaly               Neurological: Pt is alert. At baseline orientation, motor grossly intact               Skin: Skin is warm. No rashes, no other new lesions, LE edema - none; left thumb cmc with 2+ tender swelling                Psychiatric: Pt behavior is normal without agitation   Micro: none  Cardiac tracings I have personally interpreted today:  none  Pertinent Radiological findings (summarize): none   Lab Results  Component Value Date   WBC 9.2 05/08/2020   HGB 13.9 05/08/2020   HCT 42.0 05/08/2020   PLT 231.0 05/08/2020   GLUCOSE 84 05/08/2020   CHOL 139 05/08/2020   TRIG 146.0 05/08/2020   HDL 47.90 05/08/2020   LDLDIRECT 160.4 12/19/2008   LDLCALC 62 05/08/2020   ALT 16 05/08/2020   AST 19 05/08/2020   NA 140 05/08/2020   K 3.8 05/08/2020   CL 103 05/08/2020   CREATININE 1.00 05/08/2020   BUN 24 (H) 05/08/2020   CO2 33 (H) 05/08/2020   TSH 1.56 05/08/2020   HGBA1C 6.1 05/08/2020   Assessment/Plan:  TAELIN RELAFORD is a 87 y.o.  White or Caucasian [1] female with  has a past medical history of Adenomatous colon polyp, Allergy, Anemia, Anxiety, CAD (coronary artery disease), Cataract, Chronic lower back pain, Diverticulosis, GERD (gastroesophageal reflux disease), Headache(784.0), Hiatal hernia, Hyperlipidemia, Hypertension, IBS (irritable bowel syndrome) (10/13/2011), Osteoarthritis, Osteoporosis, Renal cysts, acquired, bilateral, SBO (small bowel obstruction) ([place]) (09/05/2015), and Venous insufficiency.  Pain in thumb joint with movement of left hand Sudden onset, severe, c/w probable gout vs pseudogout, for predpac asd, volt gel prn, will hold imaging for now, to sport medicine if not better in 3 -5 days,  to f/u any worsening symptoms or concerns  Hyperglycemia Lab Results  Component Value Date   HGBA1C 6.1 05/08/2020   Stable, pt to continue current medical treatment  - diet   Essential hypertension BP Readings from Last 3 Encounters:  10/24/20 134/80  09/04/20 132/78  08/14/20 128/78   Stable, pt to continue medical treatment losartan, hct  Followup: Return if symptoms worsen or fail to improve.  Cathlean Cower, MD 10/24/2020 8:39 PM [facility]

## 2020-10-24 NOTE — Patient Instructions (Signed)
Please take all new medication as prescribed - the prednisone  Please see Sports Medicine on Monday Aug 1 (or soon after ) if not better   Please continue all other medications as before, and refills have been done if requested.  Please have the pharmacy call with any other refills you may need.  Please continue your efforts at being more active, low cholesterol diet, and weight control.  Please keep your appointments with your specialists as you may have planned

## 2020-10-24 NOTE — Assessment & Plan Note (Addendum)
Sudden onset, severe, c/w probable gout vs pseudogout, for predpac asd, volt gel prn, will hold imaging for now, to sport medicine if not better in 3 -5 days,  to f/u any worsening symptoms or concerns

## 2020-10-24 NOTE — Assessment & Plan Note (Signed)
Lab Results  Component Value Date   HGBA1C 6.1 05/08/2020   Stable, pt to continue current medical treatment  - diet

## 2020-10-24 NOTE — Assessment & Plan Note (Signed)
BP Readings from Last 3 Encounters:  10/24/20 134/80  09/04/20 132/78  08/14/20 128/78   Stable, pt to continue medical treatment losartan, hct

## 2020-10-31 ENCOUNTER — Ambulatory Visit: Payer: PPO | Admitting: Orthopedic Surgery

## 2020-10-31 ENCOUNTER — Other Ambulatory Visit: Payer: Self-pay

## 2020-10-31 ENCOUNTER — Ambulatory Visit (INDEPENDENT_AMBULATORY_CARE_PROVIDER_SITE_OTHER): Payer: PPO

## 2020-10-31 DIAGNOSIS — M79645 Pain in left finger(s): Secondary | ICD-10-CM

## 2020-10-31 MED ORDER — PREGABALIN 25 MG PO CAPS: ORAL_CAPSULE | ORAL | 0 refills | Status: DC

## 2020-11-01 ENCOUNTER — Encounter: Payer: Self-pay | Admitting: Orthopedic Surgery

## 2020-11-01 NOTE — Progress Notes (Signed)
Office Visit Note   Patient: Krystal [name]           Date of Birth: [birth year]           MRN: [id number] Visit Date: 10/31/2020 Requested by: Janith Lima, MD [place],  [place] [id number] PCP: Janith Lima, MD  Subjective: Chief Complaint  Patient presents with   Other     Left thumb pain    HPI: Krystal [name] is a 87 year old patient with left thumb pain.  Reports pain primarily on the dorsal aspect around the IP joint.  Symptoms have been going on for 3 weeks.  Denies any history of trauma.  She is right-hand dominant.  She was on Neurontin but has had some GI symptoms.  She is wondering if this can be changed to Lyrica for her neuropathy issues.  Advil has helped her symptoms.  No history of gout.              ROS: All systems reviewed are negative as they relate to the chief complaint within the history of present illness.  Patient denies  fevers or chills.   Assessment & Plan: Visit Diagnoses:  1. Thumb pain, left     Plan: Impression is left thumb pain localizing to the IP joint with some dorsal swelling.  Does not look like this is infection or a trigger thumb.  Hard to say exactly what is going on based on radiographs and exam.  I would favor topical anti-inflammatories to the area with observation for now.  I think she does have a component of arthritis in that IP joint.  Her extensor in flexor tendon function to the thumb are intact.  I did write a one-time prescription for Lyrica since the Neurontin is giving her GI issues.  Follow-Up Instructions: Return if symptoms worsen or fail to improve.   Orders:  Orders Placed This Encounter  Procedures   XR Finger Thumb Left   Meds ordered this encounter  Medications   pregabalin (LYRICA) 25 MG capsule    Sig: 1 po q hs prn    Dispense:  30 capsule    Refill:  0      Procedures: No procedures performed   Clinical Data: No additional findings.  Objective: Vital Signs: There were no vitals taken  for this visit.  Physical Exam:   Constitutional: Patient appears well-developed HEENT:  Head: Normocephalic Eyes:EOM are normal Neck: Normal range of motion Cardiovascular: Normal rate Pulmonary/chest: Effort normal Neurologic: Patient is alert Skin: Skin is warm Psychiatric: Patient has normal mood and affect   Ortho Exam: Ortho exam demonstrates intact EPL FPL function.  She does have some pain with range of motion at the IP joint of the thumb.  No tenderness of the A1 pulley.  No redness or fluctuance or induration around the thumb region.  Negative grind test for CMC arthritis and negative Finkelstein's test.  Wrist range of motion is intact.  Negative carpal tunnel compression testing.  Specialty Comments:  No specialty comments available.  Imaging: XR Finger Thumb Left  Result Date: 11/01/2020 AP lateral radiographs left thumb reviewed.  No acute fracture.  Mild degenerative changes noted at the IP joint.  Minimal CMC degenerative arthritic changes also present.    PMFS History: Patient Active Problem List   Diagnosis Date Noted   Pain in thumb joint with movement of left hand 10/24/2020   Right shoulder pain 09/04/2020   Viral illness 06/08/2020   Encounter  for general adult medical examination with abnormal findings 05/08/2020   Primary osteoarthritis of first carpometacarpal joint of right hand 05/08/2020   Restless legs syndrome (RLS) 08/22/2019   Chronic peripheral neuropathic pain 08/22/2019   Chronic renal disease, stage 3, moderately decreased glomerular filtration rate (GFR) between 30-59 mL/min/1.73 square meter (HCC) 11/08/2018   Chronic idiopathic constipation 10/19/2017   LBBB (left bundle branch block) 07/16/2016   Cardiomyopathy ([place]) 07/16/2016   Headache 01/17/2016   Benign paroxysmal positional vertigo 01/17/2016   Nocturnal foot cramps 01/17/2016   Hyperglycemia 05/23/2015   IBS (irritable bowel syndrome) 10/13/2011   GERD (gastroesophageal  reflux disease) 06/25/2010   RENAL CYST 11/18/2007   OSTEOPOROSIS 11/18/2007   Allergic rhinitis 10/06/2007   Hyperlipidemia with target LDL less than 70 07/19/2007   GAD (generalized anxiety disorder) 07/19/2007   Essential hypertension 07/19/2007   CAD S/P percutaneous coronary angioplasty 07/19/2007   VENOUS INSUFFICIENCY 07/19/2007   Osteoarthritis 07/19/2007   LOW BACK PAIN SYNDROME 07/19/2007   Past Medical History:  Diagnosis Date   Adenomatous colon polyp    Allergy    seasonal   Anemia    pt. denies   Anxiety    CAD (coronary artery disease)    Cataract    cataracts removed left eye.   Chronic lower back pain    Diverticulosis    GERD (gastroesophageal reflux disease)    Headache(784.0)    Hiatal hernia    Hyperlipidemia    Hypertension    IBS (irritable bowel syndrome) 10/13/2011   Osteoarthritis    Osteoporosis    Renal cysts, acquired, bilateral    SBO (small bowel obstruction) ([place]) 09/05/2015   Venous insufficiency     Family History  Problem Relation Age of Onset   Heart disease Father    Stroke Sister    Colon cancer Neg Hx     Past Surgical History:  Procedure Laterality Date   ABDOMINAL HYSTERECTOMY     BLADDER REPAIR     CARDIAC CATHETERIZATION  01/12/2002   90% stenosis of the left circumflex, stented with a 3x56m Cypher stent, resulting in reduction of 90% to 10%    CARDIOVASCULAR STRESS TEST  03/22/2007   Mild inferolateral thinning toward the apex without significant ischemia. Nondiagnostic electrocardiogram.   CATARACT EXTRACTION     bilateral   COLONOSCOPY  08/17/2008   adenomatous polyp, diverticulosis, external hemorrhoids   COLONOSCOPY W/ BIOPSIES     multiple    LEFT LOWER EXTREMITY VENOUS DOPPLER  06/18/2011   No evidence of left lower extremity DVT   TRANSTHORACIC ECHOCARDIOGRAM  11/18/2012   EF [id number] LV systolic mild-moderately reduced, mild-moderate mitral valve regurg, mild-moderate tricuspid valve regurg. NSR-LBBB with occas  PVCs   UPPER GASTROINTESTINAL ENDOSCOPY  07/03/2010   hiatal hernia   VARICOSE VEIN SURGERY     Social History   Occupational History   Occupation: retired  Tobacco Use   Smoking status: Never   Smokeless tobacco: Never  Vaping Use   Vaping Use: Never used  Substance and Sexual Activity   Alcohol use: No    Alcohol/week: 0.0 standard drinks   Drug use: No   Sexual activity: Not on file

## 2020-11-04 ENCOUNTER — Other Ambulatory Visit: Payer: Self-pay | Admitting: Family

## 2020-11-06 ENCOUNTER — Other Ambulatory Visit: Payer: Self-pay | Admitting: Internal Medicine

## 2020-11-06 DIAGNOSIS — I2510 Atherosclerotic heart disease of native coronary artery without angina pectoris: Secondary | ICD-10-CM

## 2020-11-06 DIAGNOSIS — I10 Essential (primary) hypertension: Secondary | ICD-10-CM

## 2020-11-06 DIAGNOSIS — Z9861 Coronary angioplasty status: Secondary | ICD-10-CM

## 2020-11-20 ENCOUNTER — Other Ambulatory Visit: Payer: Self-pay | Admitting: Internal Medicine

## 2020-11-20 DIAGNOSIS — G2581 Restless legs syndrome: Secondary | ICD-10-CM

## 2020-11-20 DIAGNOSIS — G8929 Other chronic pain: Secondary | ICD-10-CM

## 2020-11-30 ENCOUNTER — Other Ambulatory Visit: Payer: Self-pay

## 2020-11-30 ENCOUNTER — Encounter: Payer: Self-pay | Admitting: Internal Medicine

## 2020-11-30 ENCOUNTER — Ambulatory Visit (INDEPENDENT_AMBULATORY_CARE_PROVIDER_SITE_OTHER): Payer: PPO | Admitting: Internal Medicine

## 2020-11-30 DIAGNOSIS — I10 Essential (primary) hypertension: Secondary | ICD-10-CM

## 2020-11-30 NOTE — Progress Notes (Signed)
   Subjective:   Patient ID: Krystal [name], female    DOB: 03-12-1930, 87 y.o.   MRN: [id number]  HPI The patient is an 87 YO female coming in for concerns about blood pressure. She does normally take metoprolol and hctz and losartan and takes them all in the morning. When she wakes up prior to taking them her BP is 150/88 average then goes down to 130/70s or less. Denies headaches or chest pains.  Review of Systems  Constitutional: Negative.   HENT: Negative.    Eyes: Negative.   Respiratory:  Negative for cough, chest tightness and shortness of breath.   Cardiovascular:  Negative for chest pain, palpitations and leg swelling.  Gastrointestinal:  Negative for abdominal distention, abdominal pain, constipation, diarrhea, nausea and vomiting.  Musculoskeletal: Negative.   Skin: Negative.   Neurological: Negative.   Psychiatric/Behavioral: Negative.     Objective:  Physical Exam Constitutional:      Appearance: She is well-developed.  HENT:     Head: Normocephalic and atraumatic.  Cardiovascular:     Rate and Rhythm: Normal rate and regular rhythm.  Pulmonary:     Effort: Pulmonary effort is normal. No respiratory distress.     Breath sounds: Normal breath sounds. No wheezing or rales.  Abdominal:     General: Bowel sounds are normal. There is no distension.     Palpations: Abdomen is soft.     Tenderness: There is no abdominal tenderness. There is no rebound.  Musculoskeletal:     Cervical back: Normal range of motion.  Skin:    General: Skin is warm and dry.  Neurological:     Mental Status: She is alert and oriented to person, place, and time.     Coordination: Coordination normal.    Vitals:   11/30/20 0801  BP: 130/74  Pulse: 75  Resp: 18  SpO2: 97%  Weight: 155 lb 9.6 oz (70.6 kg)  Height: '5\' 4"'$  (1.626 m)    This visit occurred during the SARS-CoV-2 public health emergency.  Safety protocols were in place, including screening questions prior to the visit,  additional usage of staff PPE, and extensive cleaning of exam room while observing appropriate contact time as indicated for disinfecting solutions.   Assessment & Plan:

## 2020-11-30 NOTE — Patient Instructions (Signed)
We will have you take the hydrochlorothiazide and the losartan in the morning and then take the metoprolol in the evening.

## 2020-11-30 NOTE — Assessment & Plan Note (Signed)
We will have her to change metoprolol to evening dosing and keep hctz and losartan in the morning. Her BP appears at goal and I do not feel she needs additional medication.

## 2020-12-07 ENCOUNTER — Other Ambulatory Visit: Payer: Self-pay | Admitting: Surgical

## 2020-12-07 ENCOUNTER — Telehealth: Payer: Self-pay | Admitting: Orthopedic Surgery

## 2020-12-07 MED ORDER — PREGABALIN 25 MG PO CAPS: ORAL_CAPSULE | ORAL | 0 refills | Status: DC

## 2020-12-07 NOTE — Telephone Encounter (Signed)
Patient aware.

## 2020-12-07 NOTE — Telephone Encounter (Signed)
Sent in refill lyrica

## 2020-12-07 NOTE — Telephone Encounter (Signed)
Pt would like to pregabalin for her leg pain. She states the other medication messes up her stomach  CB [phone]

## 2020-12-10 ENCOUNTER — Telehealth: Payer: Self-pay | Admitting: Surgical

## 2020-12-10 ENCOUNTER — Other Ambulatory Visit: Payer: Self-pay | Admitting: Surgical

## 2020-12-10 MED ORDER — PREGABALIN 25 MG PO CAPS: 25.0000 mg | ORAL_CAPSULE | Freq: Every evening | ORAL | 0 refills | Status: DC | PRN

## 2020-12-10 NOTE — Telephone Encounter (Signed)
Pt called about her prescription. She wants it sent to [facility] on [address]. They said they don't have it.

## 2020-12-10 NOTE — Telephone Encounter (Signed)
Last RX was printed by mistake. I e-Rxed the prescription, should be good now

## 2020-12-19 DIAGNOSIS — H5213 Myopia, bilateral: Secondary | ICD-10-CM | POA: Diagnosis not present

## 2020-12-19 DIAGNOSIS — H26492 Other secondary cataract, left eye: Secondary | ICD-10-CM | POA: Diagnosis not present

## 2020-12-19 DIAGNOSIS — H524 Presbyopia: Secondary | ICD-10-CM | POA: Diagnosis not present

## 2020-12-19 DIAGNOSIS — H04123 Dry eye syndrome of bilateral lacrimal glands: Secondary | ICD-10-CM | POA: Diagnosis not present

## 2020-12-21 ENCOUNTER — Other Ambulatory Visit: Payer: Self-pay | Admitting: Internal Medicine

## 2021-01-01 ENCOUNTER — Encounter: Payer: Self-pay | Admitting: Cardiovascular Disease

## 2021-01-01 ENCOUNTER — Other Ambulatory Visit: Payer: Self-pay

## 2021-01-01 ENCOUNTER — Ambulatory Visit: Payer: PPO | Admitting: Cardiovascular Disease

## 2021-01-01 ENCOUNTER — Other Ambulatory Visit: Payer: Self-pay | Admitting: Gastroenterology

## 2021-01-01 DIAGNOSIS — I10 Essential (primary) hypertension: Secondary | ICD-10-CM

## 2021-01-01 DIAGNOSIS — I447 Left bundle-branch block, unspecified: Secondary | ICD-10-CM | POA: Diagnosis not present

## 2021-01-01 DIAGNOSIS — I2510 Atherosclerotic heart disease of native coronary artery without angina pectoris: Secondary | ICD-10-CM

## 2021-01-01 DIAGNOSIS — E785 Hyperlipidemia, unspecified: Secondary | ICD-10-CM | POA: Diagnosis not present

## 2021-01-01 DIAGNOSIS — Z9861 Coronary angioplasty status: Secondary | ICD-10-CM | POA: Diagnosis not present

## 2021-01-01 NOTE — Progress Notes (Signed)
01/01/2021 Krystal [name]   November 29, 1929  [id number]  Primary Physician Krystal Lima, MD Primary Cardiologist: Krystal Harp MD Krystal [name], [place]  HPI:  Krystal [name] is a 87 y.o.  mildly overweight married Caucasian female mother of 2 children, grandmother of 3 grandchildren, whose husband Krystal [name] is also a patient of mine.  Bill unfortunately is currently being treated for bladder cancer.  They were both patients of Dr. Georgiann Mccoy. I last saw her in the office 09/21/2019.Marland Kitchen  Her past history is remarkable for treated hypertension and hyperlipidemia. Her father did die of a myocardial infarction. She has never had a heart attack or stroke. She does not smoke. She had a Cypher drug-eluting stent placed in her circumflex coronary artery by Dr. Eustace Quail in 2003. Her other arteries were normal as was her LV function.  She did have a 2D echo performed 11/18/2012 that showed an EF in the 45% range.    Since I spoke to her on the phone a year ago she continues to do well.  She does exercise for an hour and walks with her husband every morning.  She denies chest pain or shortness of breath..   Current Meds  Medication Sig   acetaminophen (TYLENOL) 500 MG tablet Take 500 mg by mouth every 4 (four) hours as needed for mild pain or headache.   acetaminophen-codeine (TYLENOL #3) 300-30 MG tablet Take 1 tablet by mouth daily as needed for moderate pain.   albuterol (VENTOLIN HFA) 108 (90 Base) MCG/ACT inhaler TAKE 2 PUFFS BY MOUTH EVERY 6 HOURS AS NEEDED FOR WHEEZE OR SHORTNESS OF BREATH   ALPRAZolam (XANAX) 0.5 MG tablet Take 1 tablet (0.5 mg total) by mouth 3 (three) times daily as needed for anxiety.   ascorbic acid (VITAMIN C) 500 MG tablet Take 500 mg by mouth daily.   aspirin EC 81 MG tablet Take 81 mg by mouth daily. Swallow whole.   azelastine (ASTELIN) 0.1 % nasal spray Place 2 sprays into both nostrils 2 (two) times daily. Use in each nostril as directed   baclofen  (LIORESAL) 10 MG tablet Take 1 tablet (10 mg total) by mouth 2 (two) times daily.   calcium carbonate (OS-CAL) 600 MG TABS tablet Take 600 mg by mouth 2 (two) times daily with a meal.   cholecalciferol (VITAMIN D) 1000 UNITS tablet Take 2,000 Units by mouth daily.    dicyclomine (BENTYL) 10 MG capsule Take 1 capsule (10 mg total) by mouth 4 (four) times daily -  before meals and at bedtime. As needed for cramps   docusate sodium (COLACE) 100 MG capsule Take 100 mg by mouth every other day.   fluticasone (FLONASE) 50 MCG/ACT nasal spray SPRAY 1 SPRAY INTO BOTH NOSTRILS DAILY.   hydrochlorothiazide (HYDRODIURIL) 25 MG tablet Take 1 tablet (25 mg total) by mouth daily.   lansoprazole (PREVACID) 15 MG capsule TAKE 1 CAPSULE (15 MG TOTAL) BY MOUTH 2 (TWO) TIMES DAILY BEFORE A MEAL.   levocetirizine (XYZAL) 5 MG tablet SMARTSIG:1 Tablet(s) By Mouth Every Evening   loratadine (CLARITIN) 10 MG tablet Take 1 tablet (10 mg total) by mouth daily.   losartan (COZAAR) 50 MG tablet Take 1 tablet (50 mg total) by mouth daily.   metoprolol succinate (TOPROL-XL) 25 MG 24 hr tablet TAKE 1 TABLET (25 MG TOTAL) BY MOUTH DAILY.   ondansetron (ZOFRAN ODT) 4 MG disintegrating tablet Take 1 tablet (4 mg total) by mouth every 8 (eight) hours  as needed for nausea or vomiting.   pregabalin (LYRICA) 25 MG capsule Take 1 capsule (25 mg total) by mouth at bedtime as needed. 1 po q hs prn   PREMARIN vaginal cream APPLY 1 APPLICATORFUL 2 3 TIMES A WEEK AS DIRECTED   Probiotic Product (ALIGN) 4 MG CAPS Take 4 mg by mouth as needed.    RESTASIS 0.05 % ophthalmic emulsion    rosuvastatin (CRESTOR) 10 MG tablet TAKE 1 TABLET BY MOUTH EVERY DAY   triamcinolone cream (KENALOG) 0.5 % Apply 1 application topically 3 (three) times daily.     Allergies  Allergen Reactions   Atorvastatin Other (See Comments)    Muscle aches   Simvastatin Other (See Comments)    Myalgias    Alendronate Sodium     REACTION: pt states INTOL to  Fosamax w/ esophagitis   Cefdinir     REACTION: diarrhea   Doxycycline     diarrhea   Esomeprazole Magnesium     REACTION: pt states INTOL to Nexium   Levofloxacin     REACTION: diarrhea   Omeprazole     REACTION: pt states INTOL to Prilosec   Other     Pneumonia vaccine---felt tired and achy and sick x 6 months to get over this.   Penicillins Hives   Amlodipine Other (See Comments)    Ankle edema   Shellfish-Derived Products Hives, Nausea Only and Rash    Social History   Socioeconomic History   Marital status: Married    Spouse name: Krystal [name]   Number of children: Not on file   Years of education: Not on file   Highest education level: Not on file  Occupational History   Occupation: retired  Tobacco Use   Smoking status: Never   Smokeless tobacco: Never  Vaping Use   Vaping Use: Never used  Substance and Sexual Activity   Alcohol use: No    Alcohol/week: 0.0 standard drinks   Drug use: No   Sexual activity: Not on file  Other Topics Concern   Not on file  Social History Narrative   Married to [place] who has congestive heart failure problems   She is retired never smoker rare alcohol   Social Determinants of Radio broadcast assistant Strain: Low Risk    Difficulty of Paying Living Expenses: Not hard at all  Food Insecurity: Not on file  Transportation Needs: Not on file  Physical Activity: Not on file  Stress: Not on file  Social Connections: Not on file  Intimate Partner Violence: Not on file     Review of Systems: General: negative for chills, fever, night sweats or weight changes.  Cardiovascular: negative for chest pain, dyspnea on exertion, edema, orthopnea, palpitations, paroxysmal nocturnal dyspnea or shortness of breath Dermatological: negative for rash Respiratory: negative for cough or wheezing Urologic: negative for hematuria Abdominal: negative for nausea, vomiting, diarrhea, bright red blood per rectum, melena, or hematemesis Neurologic:  negative for visual changes, syncope, or dizziness All other systems reviewed and are otherwise negative except as noted above.    Blood pressure 122/80, pulse 69, height 5\' 4"  (1.626 m), weight 155 lb (70.3 kg).  General appearance: alert and no distress Neck: no adenopathy, no carotid bruit, no JVD, supple, symmetrical, trachea midline, and thyroid not enlarged, symmetric, no tenderness/mass/nodules Lungs: clear to auscultation bilaterally Heart: regular rate and rhythm, S1, S2 normal, no murmur, click, rub or gallop Extremities: extremities normal, atraumatic, no cyanosis or edema Pulses: 2+ and symmetric Skin:  Skin color, texture, turgor normal. No rashes or lesions Neurologic: Grossly normal  EKG sinus rhythm at 69 with left bundle branch block.  I personally reviewed this EKG.  ASSESSMENT AND PLAN:   Hyperlipidemia with target LDL less than 70 History of hyperlipidemia on statin therapy with lipid profile performed 05/08/2020 revealing total cholesterol of 139, LDL 62 and HDL of 47.  Essential hypertension History of essential hypertension a blood pressure measured today at 122/80.  She is on losartan and metoprolol.  CAD S/P percutaneous coronary angioplasty History of CAD status post circumflex stenting by Dr. Rockne Menghini in 2003.  She had a Cypher drug eluding stent placed.  Her EF at that time was 45% by 2D echo.  She is completely asymptomatic.  LBBB (left bundle branch block) Chronic left bundle branch block     Krystal Harp MD FACP,FACC,FAHA, [facility] 01/01/2021 3:00 PM

## 2021-01-01 NOTE — Assessment & Plan Note (Signed)
Chronic left bundle branch block. 

## 2021-01-01 NOTE — Assessment & Plan Note (Signed)
History of essential hypertension a blood pressure measured today at 122/80.  She is on losartan and metoprolol.

## 2021-01-01 NOTE — Patient Instructions (Signed)

## 2021-01-01 NOTE — Assessment & Plan Note (Signed)
History of CAD status post circumflex stenting by Dr. Rockne Menghini in 2003.  She had a Cypher drug eluding stent placed.  Her EF at that time was 45% by 2D echo.  She is completely asymptomatic.

## 2021-01-01 NOTE — Assessment & Plan Note (Signed)
History of hyperlipidemia on statin therapy with lipid profile performed 05/08/2020 revealing total cholesterol of 139, LDL 62 and HDL of 47.

## 2021-01-04 ENCOUNTER — Other Ambulatory Visit: Payer: Self-pay | Admitting: Internal Medicine

## 2021-01-04 DIAGNOSIS — I10 Essential (primary) hypertension: Secondary | ICD-10-CM

## 2021-01-06 ENCOUNTER — Other Ambulatory Visit: Payer: Self-pay | Admitting: Internal Medicine

## 2021-01-06 DIAGNOSIS — I10 Essential (primary) hypertension: Secondary | ICD-10-CM

## 2021-01-07 DIAGNOSIS — Z1231 Encounter for screening mammogram for malignant neoplasm of breast: Secondary | ICD-10-CM | POA: Diagnosis not present

## 2021-01-07 DIAGNOSIS — Z01411 Encounter for gynecological examination (general) (routine) with abnormal findings: Secondary | ICD-10-CM | POA: Diagnosis not present

## 2021-01-07 DIAGNOSIS — N952 Postmenopausal atrophic vaginitis: Secondary | ICD-10-CM | POA: Diagnosis not present

## 2021-01-07 DIAGNOSIS — M810 Age-related osteoporosis without current pathological fracture: Secondary | ICD-10-CM | POA: Diagnosis not present

## 2021-01-07 DIAGNOSIS — Z6826 Body mass index (BMI) 26.0-26.9, adult: Secondary | ICD-10-CM | POA: Diagnosis not present

## 2021-01-07 DIAGNOSIS — Z124 Encounter for screening for malignant neoplasm of cervix: Secondary | ICD-10-CM | POA: Diagnosis not present

## 2021-01-07 DIAGNOSIS — Z01419 Encounter for gynecological examination (general) (routine) without abnormal findings: Secondary | ICD-10-CM | POA: Diagnosis not present

## 2021-01-07 DIAGNOSIS — Z90711 Acquired absence of uterus with remaining cervical stump: Secondary | ICD-10-CM | POA: Diagnosis not present

## 2021-01-17 ENCOUNTER — Other Ambulatory Visit: Payer: Self-pay

## 2021-01-17 ENCOUNTER — Ambulatory Visit (INDEPENDENT_AMBULATORY_CARE_PROVIDER_SITE_OTHER): Payer: PPO

## 2021-01-17 DIAGNOSIS — Z23 Encounter for immunization: Secondary | ICD-10-CM | POA: Diagnosis not present

## 2021-01-26 ENCOUNTER — Other Ambulatory Visit: Payer: Self-pay | Admitting: Internal Medicine

## 2021-01-29 ENCOUNTER — Ambulatory Visit (INDEPENDENT_AMBULATORY_CARE_PROVIDER_SITE_OTHER): Payer: PPO | Admitting: Pharmacist

## 2021-01-29 ENCOUNTER — Other Ambulatory Visit: Payer: Self-pay

## 2021-01-29 DIAGNOSIS — M792 Neuralgia and neuritis, unspecified: Secondary | ICD-10-CM

## 2021-01-29 DIAGNOSIS — G8929 Other chronic pain: Secondary | ICD-10-CM

## 2021-01-29 DIAGNOSIS — I2510 Atherosclerotic heart disease of native coronary artery without angina pectoris: Secondary | ICD-10-CM

## 2021-01-29 DIAGNOSIS — E785 Hyperlipidemia, unspecified: Secondary | ICD-10-CM

## 2021-01-29 DIAGNOSIS — Z9861 Coronary angioplasty status: Secondary | ICD-10-CM

## 2021-01-29 DIAGNOSIS — F411 Generalized anxiety disorder: Secondary | ICD-10-CM

## 2021-01-29 DIAGNOSIS — N1831 Chronic kidney disease, stage 3a: Secondary | ICD-10-CM

## 2021-01-29 DIAGNOSIS — M810 Age-related osteoporosis without current pathological fracture: Secondary | ICD-10-CM

## 2021-01-29 DIAGNOSIS — I10 Essential (primary) hypertension: Secondary | ICD-10-CM

## 2021-01-29 NOTE — Progress Notes (Signed)
Chronic Care Management Pharmacy Note  02/04/2021 Name:  Krystal [name] MRN:  [id number] DOB:  25-Dec-1929  Summary: -Pt reports compliance with medications; she reports BP still fluctuates but overall doing better since moving losartan to bedtime -Pt is not taking baclofen anymore and pain is relatively controlled  Recommendations/Changes made from today's visit: -No med changes; removed baclofen from med list -Advised pt to get Prevnar and Covid booster vaccines  Subjective: Krystal [name] is an 87 y.o. year old female who is a primary patient of Janith Lima, MD.  The CCM team was consulted for assistance with disease management and care coordination needs.    Engaged with patient by telephone for follow up visit in response to provider referral for pharmacy case management and/or care coordination services.   Consent to Services:  The patient was given information about Chronic Care Management services, agreed to services, and gave verbal consent prior to initiation of services.  Please see initial visit note for detailed documentation.   Patient Care Team: Janith Lima, MD as PCP - General (Internal Medicine) Lorretta Harp, MD as PCP - Cardiology (Cardiology) Charlton Haws, [facility] as Pharmacist (Pharmacist)  Recent office visits: 11/30/20 Dr Sharlet Salina OV: HTN; change metoprolol to evening and keep HCTZ, losartan in AM  10/24/20 [name] OV: thumb pain/swelling - probably gout vs pseudogout; rx prednisone, voltaren gel  09/04/20 Dr Sharlet Salina OV: acute shoulder pain - rx'd prednisone 40 mg x 5 days, refilled baclofen  08/14/20 Dr Quay Burow OV: acute visit for abdominal pain; hold baclofen for a few days; increase lansoprazole to BID until sx improve; take zofran TID prn; take dicyclomine TID regularly for a few days  07/27/20 Crawford (PCP) - Low back pain. Ketorolac & Methylprednisolone acetate injections given.  Start Baclofen 10 mg.    06/08/20 Crawford (PCP) - Viral Illness.  Start butalbital-acetaminophen-caffeine 50-325-40 mg.    05/08/20 Ronnald Ramp (PCP) - Hypertension/Hyperlipidemia. No changes. F/u 6 months.  Recent consult visits: 01/01/21 Dr Gwenlyn Found (cardiology): f/u CAD, LBBB; no med changes  10/31/20 Dr Marlou Sa (ortho): f/u thumb pain; rx'd Lyrica one-time rx since gabapentin causing GI issues  08/03/20 PA Magnant (ortho surgery): f/u neck pain, rx'd prednisone course.  06/13/20 Dean (Ortho Surgeon) - Pain in right hand.   05/03/20 Dean (Ortho Surgeon) - Start Gabapentin 100 mg at bedtime.   02/03/20 Magnant (Ortho Surgeon) - Neuropathy. Stop Pregabalin. Start Gabapentin 100 mg. F/u 6 wks.  Hospital visits: None in previous 6 months  Objective:  Lab Results  Component Value Date   CREATININE 1.00 05/08/2020   BUN 24 (H) 05/08/2020   GFR 50.38 (L) 05/08/2020   GFRNONAA 72 04/28/2017   GFRAA 83 04/28/2017   NA 140 05/08/2020   K 3.8 05/08/2020   CALCIUM 9.0 05/08/2020   CO2 33 (H) 05/08/2020   GLUCOSE 84 05/08/2020    Lab Results  Component Value Date/Time   HGBA1C 6.1 05/08/2020 09:50 AM   HGBA1C 6.0 11/08/2018 02:36 PM   GFR 50.38 (L) 05/08/2020 09:50 AM   GFR 41.30 (L) 02/11/2019 01:54 PM    Last diabetic Eye exam: No results found for: HMDIABEYEEXA  Last diabetic Foot exam: No results found for: HMDIABFOOTEX   Lab Results  Component Value Date   CHOL 139 05/08/2020   HDL 47.90 05/08/2020   LDLCALC 62 05/08/2020   LDLDIRECT 160.4 12/19/2008   TRIG 146.0 05/08/2020   CHOLHDL 3 05/08/2020    Hepatic Function Latest Ref Rng & Units 05/08/2020  02/11/2019 11/08/2018  Total Protein 6.0 - 8.3 g/dL 6.4 6.8 6.7  Albumin 3.5 - 5.2 g/dL 3.9 4.2 4.2  AST 0 - 37 U/L 19 18 18   ALT 0 - 35 U/L 16 16 13   Alk Phosphatase 39 - 117 U/L 81 92 94  Total Bilirubin 0.2 - 1.2 mg/dL 0.5 0.4 0.4  Bilirubin, Direct 0.0 - 0.3 mg/dL 0.1 - 0.1    Lab Results  Component Value Date/Time   TSH 1.56 05/08/2020 09:50 AM   TSH 1.62 11/08/2018 02:36 PM   FREET4 1.42  11/16/2012 08:10 AM    CBC Latest Ref Rng & Units 05/08/2020 02/11/2019 11/08/2018  WBC 4.0 - 10.5 K/uL 9.2 9.7 7.8  Hemoglobin 12.0 - 15.0 g/dL 13.9 14.6 14.0  Hematocrit 36.0 - 46.0 % 42.0 43.7 41.9  Platelets 150.0 - 400.0 K/uL 231.0 228.0 220.0    Lab Results  Component Value Date/Time   VD25OH 55.36 10/19/2017 02:40 PM   VD25OH 41 04/13/2012 11:46 AM   VD25OH 45 02/06/2010 08:06 PM    Clinical ASCVD: Yes  The ASCVD Risk score (Arnett DK, et al., 2019) failed to calculate for the following reasons:   The 2019 ASCVD risk score is only valid for ages 92 to 81    Depression screen PHQ 2/9 10/24/2020 05/08/2020 12/29/2018  Decreased Interest 0 0 0  Down, Depressed, Hopeless 0 0 0  PHQ - 2 Score 0 0 0  Some recent data might be hidden     Social History   Tobacco Use  Smoking Status Never  Smokeless Tobacco Never   BP Readings from Last 3 Encounters:  01/31/21 140/80  01/01/21 122/80  11/30/20 130/74   Pulse Readings from Last 3 Encounters:  01/31/21 62  01/01/21 69  11/30/20 75   Wt Readings from Last 3 Encounters:  01/31/21 154 lb (69.9 kg)  01/01/21 155 lb (70.3 kg)  11/30/20 155 lb 9.6 oz (70.6 kg)   BMI Readings from Last 3 Encounters:  01/31/21 26.43 kg/m  01/01/21 26.61 kg/m  11/30/20 26.71 kg/m    Assessment/Interventions: Review of patient past medical history, allergies, medications, health status, including review of consultants reports, laboratory and other test data, was performed as part of comprehensive evaluation and provision of chronic care management services.   SDOH:  (Social Determinants of Health) assessments and interventions performed: Yes   SDOH Screenings   Alcohol Screen: Not on file  Depression (PHQ2-9): Low Risk    PHQ-2 Score: 0  Financial Resource Strain: Low Risk    Difficulty of Paying Living Expenses: Not hard at all  Food Insecurity: Not on file  Housing: Not on file  Physical Activity: Not on file  Social  Connections: Not on file  Stress: Not on file  Tobacco Use: Low Risk    Smoking Tobacco Use: Never   Smokeless Tobacco Use: Never   Passive Exposure: Not on file  Transportation Needs: Not on file    CCM Care Plan  Allergies  Allergen Reactions   Atorvastatin Other (See Comments)    Muscle aches   Simvastatin Other (See Comments)    Myalgias    Alendronate Sodium     REACTION: pt states INTOL to Fosamax w/ esophagitis   Cefdinir     REACTION: diarrhea   Doxycycline     diarrhea   Esomeprazole Magnesium     REACTION: pt states INTOL to Nexium   Levofloxacin     REACTION: diarrhea   Omeprazole  REACTION: pt states INTOL to Prilosec   Other     Pneumonia vaccine---felt tired and achy and sick x 6 months to get over this.   Penicillins Hives   Amlodipine Other (See Comments)    Ankle edema   Shellfish-Derived Products Hives, Nausea Only and Rash    Medications Reviewed Today     Reviewed by Binnie Rail, MD (Physician) on 01/31/21 at 1045  Med List Status: <None>   Medication Order Taking? Sig Documenting Provider Last Dose Status Informant  acetaminophen (TYLENOL) 500 MG tablet [id number] Yes Take 500 mg by mouth every 4 (four) hours as needed for mild pain or headache. [provider] Taking Active Self  acetaminophen-codeine (TYLENOL #3) 300-30 MG tablet [id number] Yes Take 1 tablet by mouth daily as needed for moderate pain. Magnant, Gerrianne Scale, PA-C Taking Active   albuterol (VENTOLIN HFA) 108 (90 Base) MCG/ACT inhaler [id number] Yes TAKE 2 PUFFS BY MOUTH EVERY 6 HOURS AS NEEDED FOR WHEEZE OR SHORTNESS OF Cherly Beach, MD Taking Active   ALPRAZolam Duanne Moron) 0.5 MG tablet [id number] Yes Take 1 tablet (0.5 mg total) by mouth 3 (three) times daily as needed for anxiety. Janith Lima, MD Taking Active   ascorbic acid (VITAMIN C) 500 MG tablet [id number] Yes Take 500 mg by mouth daily. [provider] Taking Active   aspirin EC 81 MG tablet  [id number] Yes Take 81 mg by mouth daily. Swallow whole. [provider] Taking Active   azelastine (ASTELIN) 0.1 % nasal spray [id number] Yes Place 2 sprays into both nostrils 2 (two) times daily. Use in each nostril as directed Janith Lima, MD Taking Active   baclofen (LIORESAL) 10 MG tablet [id number] Yes Take 1 tablet (10 mg total) by mouth 2 (two) times daily. [name], MD Taking Active   calcium carbonate (OS-CAL) 600 MG TABS tablet [id number] Yes Take 600 mg by mouth 2 (two) times daily with a meal. [provider] Taking Active Self  cholecalciferol (VITAMIN D) 1000 UNITS tablet [id number] Yes Take 2,000 Units by mouth daily.  [provider] Taking Active Self           Med Note Rolan Bucco Aug 09, 2020 10:02 AM)    dicyclomine (BENTYL) 10 MG capsule [id number] Yes Take 1 capsule (10 mg total) by mouth 4 (four) times daily -  before meals and at bedtime. As needed for cramps Marrian Salvage, FNP Taking Active   docusate sodium (COLACE) 100 MG capsule [id number] Yes Take 100 mg by mouth every other day. [provider] Taking Active   fluticasone (FLONASE) 50 MCG/ACT nasal spray [id number] Yes SPRAY 1 SPRAY INTO BOTH NOSTRILS DAILY. Janith Lima, MD Taking Active   hydrochlorothiazide (HYDRODIURIL) 25 MG tablet [id number] Yes Take 1 tablet (25 mg total) by mouth daily. Janith Lima, MD Taking Active   lansoprazole (PREVACID) 15 MG capsule [id number] Yes TAKE 1 CAPSULE (15 MG TOTAL) BY MOUTH 2 (TWO) TIMES DAILY BEFORE A MEAL. Zehr, Laban Emperor, PA-C Taking Active   levocetirizine (XYZAL) 5 MG tablet [id number] Yes SMARTSIG:1 Tablet(s) By Mouth Every Evening [provider] Taking Active   loratadine (CLARITIN) 10 MG tablet [id number] Yes Take 1 tablet (10 mg total) by mouth daily. Janith Lima, MD Taking Active   losartan (COZAAR) 50 MG tablet [id number] Yes TAKE 1 TABLET BY MOUTH EVERY DAY Janith Lima, MD Taking  Active   metoprolol succinate (TOPROL-XL) 25  MG 24 hr tablet [id number] Yes TAKE 1 TABLET (25 MG TOTAL) BY MOUTH DAILY. Janith Lima, MD Taking Active   ondansetron (ZOFRAN ODT) 4 MG disintegrating tablet [id number] Yes Take 1 tablet (4 mg total) by mouth every 8 (eight) hours as needed for nausea or vomiting. Binnie Rail, MD Taking Active   pregabalin (LYRICA) 25 MG capsule [id number] Yes Take 1 capsule (25 mg total) by mouth at bedtime as needed. 1 po q hs prn Donella Stade, PA-C Taking Active   PREMARIN vaginal cream [id number] Yes APPLY 1 APPLICATORFUL 2 3 TIMES A WEEK AS DIRECTED [provider] Taking Active   Probiotic Product (ALIGN) 4 MG CAPS [id number] Yes Take 4 mg by mouth as needed.  [provider] Taking Active Self  RESTASIS 0.05 % ophthalmic emulsion [id number] Yes  [provider] Taking Active   rosuvastatin (CRESTOR) 10 MG tablet [id number] Yes TAKE 1 TABLET BY MOUTH EVERY DAY Lorretta Harp, MD Taking Active   triamcinolone cream (KENALOG) 0.5 % [id number] Yes Apply 1 application topically 3 (three) times daily. Janith Lima, MD Taking Active             Patient Active Problem List   Diagnosis Date Noted   Pain in thumb joint with movement of left hand 10/24/2020   Right shoulder pain 09/04/2020   Viral illness 06/08/2020   Encounter for general adult medical examination with abnormal findings 05/08/2020   Primary osteoarthritis of first carpometacarpal joint of right hand 05/08/2020   Restless legs syndrome (RLS) 08/22/2019   Chronic peripheral neuropathic pain 08/22/2019   Chronic renal disease, stage 3, moderately decreased glomerular filtration rate (GFR) between 30-59 mL/min/1.73 square meter (HCC) 11/08/2018   Chronic idiopathic constipation 10/19/2017   LBBB (left bundle branch block) 07/16/2016   Cardiomyopathy ([place]) 07/16/2016   Headache 01/17/2016   Benign paroxysmal positional vertigo 01/17/2016   Nocturnal foot cramps  01/17/2016   Hyperglycemia 05/23/2015   IBS (irritable bowel syndrome) 10/13/2011   GERD (gastroesophageal reflux disease) 06/25/2010   RENAL CYST 11/18/2007   OSTEOPOROSIS 11/18/2007   Allergic rhinitis 10/06/2007   Hyperlipidemia with target LDL less than 70 07/19/2007   GAD (generalized anxiety disorder) 07/19/2007   Essential hypertension 07/19/2007   CAD S/P percutaneous coronary angioplasty 07/19/2007   VENOUS INSUFFICIENCY 07/19/2007   Osteoarthritis 07/19/2007   LOW BACK PAIN SYNDROME 07/19/2007    Immunization History  Administered Date(s) Administered   Fluad Quad(high Dose 65+) 12/31/2018, 01/13/2020, 01/17/2021   Influenza Split 02/04/2011, 01/22/2012   Influenza Whole 01/12/2008, 02/08/2009, 02/05/2010   Influenza, High Dose Seasonal PF 01/17/2016, 01/20/2017, 01/20/2018   Influenza,inj,Quad PF,6+ Mos 01/06/2013, 01/20/2014   Influenza-Unspecified 01/08/2015   PFIZER(Purple Top)SARS-COV-2 Vaccination 04/10/2019, 05/01/2019, 07/17/2020   Tdap 10/03/2015   Zoster Recombinat (Shingrix) 11/26/2018, 02/15/2019   Zoster, Live 04/16/2012    Conditions to be addressed/monitored:  Hypertension, Hyperlipidemia, Coronary Artery Disease, Chronic Kidney Disease, Anxiety, Osteoporosis, and Osteoarthritis  Care Plan : CCM Pharmacy Care Plan  Updates made by Charlton Haws, [place] since 02/04/2021 12:00 AM     Problem: Hypertension, Hyperlipidemia, Coronary Artery Disease, Chronic Kidney Disease, Anxiety, Osteoporosis, and Osteoarthritis   Priority: High     Long-Range Goal: Disease management   Start Date: 08/12/2020  Expected End Date: 08/12/2021  This Visit's Progress: On track  Recent Progress: On track  Priority: High  Note:   Current Barriers:  Unable to independently monitor therapeutic efficacy  Pharmacist Clinical Goal(s):  Patient will achieve  adherence to monitoring guidelines and medication adherence to achieve therapeutic efficacy through collaboration  with PharmD and provider.   Interventions: 1:1 collaboration with Janith Lima, MD regarding development and update of comprehensive plan of care as evidenced by provider attestation and co-signature Inter-disciplinary care team collaboration (see longitudinal plan of care) Comprehensive medication review performed; medication list updated in electronic medical record  Hypertension (BP goal <140/90) -Controlled - patient reports checking BP multiple times per day and BP fluctuates significantly but on average is at goal -Current home readings: "better since moving losartan to bedtime" -Current treatment: HCTZ 25 mg daily AM Losartan 50 mg daily PM Metoprolol succinate 25 mg daily AM -Medications previously tried: amlodipine (edema)  -Current exercise habits: walks daily -Denies hypotensive/hypertensive symptoms -Educated on BP goals and benefits of medications for prevention of heart attack, stroke and kidney damage; Importance of home blood pressure monitoring; Proper BP monitoring technique; -Counseled to monitor BP at home daily -Recommended to continue current medication  Hyperlipidemia / CAD: (LDL goal < 70) -Controlled - LDL is at goal; pt endorses compliance and denies issues -hx CAD, s/p DES 2003 -Current treatment: Rosuvastatin 10 mg daily HS Aspirin 81 mg daily -Medications previously tried: simvastatin  -Educated on Cholesterol goals; Benefits of statin for ASCVD risk reduction; -Recommended to continue current medication  Depression/Anxiety (Goal: manage symptoms) -Controlled - pt tries to avoid using alprazolam, she has not used in > 3 months -Current treatment: Alprazolam 0.5 mg TID prn -Medications previously tried/failed: lorazepam -PHQ9: 0 -GAD7: not on file -Connected with PCP for mental health support -Educated on when to use alprazolam; advised continuing to limit use -Recommended to continue current medication  Osteoporosis (Goal: prevent  fractures) -Not ideally controlled - pt is a candidate for treatment but has failed bisphosphonate due to side effects and declined Prolia previously due to cost; she is not interested in starting therapy now -Last DEXA Scan: 03/2017  T-Score total hip: -3.1  T-Score lumbar spine: -2.9 -Patient is a candidate for pharmacologic treatment due to T-Score < -2.5 in total hip  and T-Score < -2.5 in lumbar spine -Current treatment  Calcium 600 mg BID Vitamin D 1000 IU daily -Medications previously tried: Forteo x 8 mos, alendronate (esophagitis), declined Prolia d/t copay -Recommend [phone] units of vitamin D daily. Recommend 1200 mg of calcium daily from dietary and supplemental sources.  IBS / CIC (Goal: manage symptoms) -Controlled - Patient is satisfied with current regimen and denies issues -Current treatment  Dicyclomine 10 mg QID prn -rare use Docusate 100 mg QOD Probiotic daily (Align) -Recommended to continue current medication  Pain (Goal: manage symptoms) -Controlled - pt reports pain is relatively under control -osteoarthritis, low back pain, peripheral neuropathy -Current treatment  Baclofen 10 mg BID prn - not taking Pregabalin 25 mg daily HS Tylenol 500 mg  -Removed baclofen from med list -Recommended to continue current medication  Health Maintenance -Vaccine gaps: covid booster, Prevnar -Advised to get [employer] (pharmacy or office) and covid booster (pharmacy)  Patient Goals/Self-Care Activities Patient will:  - take medications as prescribed -focus on medication adherence by routine -check blood pressure daily      Compliance/Adherence/Medication fill history: Care Gaps: None  Star-Rating Drugs: Losartan - LF 01/04/21 x 90 ds Rosuvastatin - LF 12/16/20 x 90 ds  Medication Assistance: None required.  Patient affirms current coverage meets needs.  Patient's preferred pharmacy is:  [id number] [id number]:  [phone] Fax: [phone]  Uses pill box? Yes Pt endorses 100% compliance  We discussed: Current pharmacy is preferred with insurance plan and patient is satisfied with pharmacy services Patient decided to: Continue current medication management strategy  Care Plan and Follow Up Patient Decision:  Patient agrees to Care Plan and Follow-up.  Plan: Telephone follow up appointment with care management team member scheduled for:  6 months  Charlene Brooke, PharmD, [place], CPP Clinical Pharmacist [facility] [phone]

## 2021-01-30 NOTE — Progress Notes (Signed)
Subjective:    Patient ID: Krystal [name], female    DOB: Apr 25, 1929, 87 y.o.   MRN: [id number]  This visit occurred during the SARS-CoV-2 public health emergency.  Safety protocols were in place, including screening questions prior to the visit, additional usage of staff PPE, and extensive cleaning of exam room while observing appropriate contact time as indicated for disinfecting solutions.    HPI The patient is here for an acute visit.  Anxiety - she is taking xanax 0.5 mg TID prn. In the past she was on ativan and wellbutrin - no other SSRIs.   She has intermittent anxiety only.  She prefers the lorazepam.  She can take a small amount at night sometimes when she is anxious.  She takes it only prn and take 1/2 of a pill.    She is having sinus issues.  She has a lot of drainage down her throat - intermittently.  She has sinus pressure and thinks she may have an infection.  She takes claritin and flonase.  She take mucinex sometimes.      Medications and allergies reviewed with patient and updated if appropriate.  Patient Active Problem List   Diagnosis Date Noted   Pain in thumb joint with movement of left hand 10/24/2020   Right shoulder pain 09/04/2020   Viral illness 06/08/2020   Encounter for general adult medical examination with abnormal findings 05/08/2020   Primary osteoarthritis of first carpometacarpal joint of right hand 05/08/2020   Restless legs syndrome (RLS) 08/22/2019   Chronic peripheral neuropathic pain 08/22/2019   Chronic renal disease, stage 3, moderately decreased glomerular filtration rate (GFR) between 30-59 mL/min/1.73 square meter (HCC) 11/08/2018   Chronic idiopathic constipation 10/19/2017   LBBB (left bundle branch block) 07/16/2016   Cardiomyopathy ([place]) 07/16/2016   Headache 01/17/2016   Benign paroxysmal positional vertigo 01/17/2016   Nocturnal foot cramps 01/17/2016   Hyperglycemia 05/23/2015   IBS (irritable bowel syndrome) 10/13/2011    GERD (gastroesophageal reflux disease) 06/25/2010   RENAL CYST 11/18/2007   OSTEOPOROSIS 11/18/2007   Allergic rhinitis 10/06/2007   Hyperlipidemia with target LDL less than 70 07/19/2007   GAD (generalized anxiety disorder) 07/19/2007   Essential hypertension 07/19/2007   CAD S/P percutaneous coronary angioplasty 07/19/2007   VENOUS INSUFFICIENCY 07/19/2007   Osteoarthritis 07/19/2007   LOW BACK PAIN SYNDROME 07/19/2007    Current Outpatient Medications on File Prior to Visit  Medication Sig Dispense Refill   acetaminophen (TYLENOL) 500 MG tablet Take 500 mg by mouth every 4 (four) hours as needed for mild pain or headache.     acetaminophen-codeine (TYLENOL #3) 300-30 MG tablet Take 1 tablet by mouth daily as needed for moderate pain. 20 tablet 0   albuterol (VENTOLIN HFA) 108 (90 Base) MCG/ACT inhaler TAKE 2 PUFFS BY MOUTH EVERY 6 HOURS AS NEEDED FOR WHEEZE OR SHORTNESS OF BREATH 8.5 each 1   ascorbic acid (VITAMIN C) 500 MG tablet Take 500 mg by mouth daily.     aspirin EC 81 MG tablet Take 81 mg by mouth daily. Swallow whole.     azelastine (ASTELIN) 0.1 % nasal spray Place 2 sprays into both nostrils 2 (two) times daily. Use in each nostril as directed 90 mL 1   baclofen (LIORESAL) 10 MG tablet Take 1 tablet (10 mg total) by mouth 2 (two) times daily. 180 tablet 1   calcium carbonate (OS-CAL) 600 MG TABS tablet Take 600 mg by mouth 2 (two) times daily with a  meal.     cholecalciferol (VITAMIN D) 1000 UNITS tablet Take 2,000 Units by mouth daily.      dicyclomine (BENTYL) 10 MG capsule Take 1 capsule (10 mg total) by mouth 4 (four) times daily -  before meals and at bedtime. As needed for cramps 60 capsule 0   docusate sodium (COLACE) 100 MG capsule Take 100 mg by mouth every other day.     fluticasone (FLONASE) 50 MCG/ACT nasal spray SPRAY 1 SPRAY INTO BOTH NOSTRILS DAILY. 48 mL 1   hydrochlorothiazide (HYDRODIURIL) 25 MG tablet Take 1 tablet (25 mg total) by mouth daily. 90 tablet 1    lansoprazole (PREVACID) 15 MG capsule TAKE 1 CAPSULE (15 MG TOTAL) BY MOUTH 2 (TWO) TIMES DAILY BEFORE A MEAL. 180 capsule 3   loratadine (CLARITIN) 10 MG tablet Take 1 tablet (10 mg total) by mouth daily. 90 tablet 3   losartan (COZAAR) 50 MG tablet TAKE 1 TABLET BY MOUTH EVERY DAY 90 tablet 0   metoprolol succinate (TOPROL-XL) 25 MG 24 hr tablet TAKE 1 TABLET (25 MG TOTAL) BY MOUTH DAILY. 90 tablet 1   ondansetron (ZOFRAN ODT) 4 MG disintegrating tablet Take 1 tablet (4 mg total) by mouth every 8 (eight) hours as needed for nausea or vomiting. 20 tablet 0   pregabalin (LYRICA) 25 MG capsule Take 1 capsule (25 mg total) by mouth at bedtime as needed. 1 po q hs prn 30 capsule 0   PREMARIN vaginal cream APPLY 1 APPLICATORFUL 2 3 TIMES A WEEK AS DIRECTED     Probiotic Product (ALIGN) 4 MG CAPS Take 4 mg by mouth as needed.      RESTASIS 0.05 % ophthalmic emulsion      rosuvastatin (CRESTOR) 10 MG tablet TAKE 1 TABLET BY MOUTH EVERY DAY 90 tablet 3   triamcinolone cream (KENALOG) 0.5 % Apply 1 application topically 3 (three) times daily. 30 g 0   No current facility-administered medications on file prior to visit.    Past Medical History:  Diagnosis Date   Adenomatous colon polyp    Allergy    seasonal   Anemia    pt. denies   Anxiety    CAD (coronary artery disease)    Cataract    cataracts removed left eye.   Chronic lower back pain    Diverticulosis    GERD (gastroesophageal reflux disease)    Headache(784.0)    Hiatal hernia    Hyperlipidemia    Hypertension    IBS (irritable bowel syndrome) 10/13/2011   Osteoarthritis    Osteoporosis    Renal cysts, acquired, bilateral    SBO (small bowel obstruction) ([place]) 09/05/2015   Venous insufficiency     Past Surgical History:  Procedure Laterality Date   ABDOMINAL HYSTERECTOMY     BLADDER REPAIR     CARDIAC CATHETERIZATION  01/12/2002   90% stenosis of the left circumflex, stented with a 3x45mm Cypher stent, resulting in  reduction of 90% to 10%    CARDIOVASCULAR STRESS TEST  03/22/2007   Mild inferolateral thinning toward the apex without significant ischemia. Nondiagnostic electrocardiogram.   CATARACT EXTRACTION     bilateral   COLONOSCOPY  08/17/2008   adenomatous polyp, diverticulosis, external hemorrhoids   COLONOSCOPY W/ BIOPSIES     multiple    LEFT LOWER EXTREMITY VENOUS DOPPLER  06/18/2011   No evidence of left lower extremity DVT   TRANSTHORACIC ECHOCARDIOGRAM  11/18/2012   EF 52-77%, LV systolic mild-moderately reduced, mild-moderate mitral valve regurg,  mild-moderate tricuspid valve regurg. NSR-LBBB with occas PVCs   UPPER GASTROINTESTINAL ENDOSCOPY  07/03/2010   hiatal hernia   VARICOSE VEIN SURGERY      Social History   Socioeconomic History   Marital status: Married    Spouse name: [name]   Number of children: Not on file   Years of education: Not on file   Highest education level: Not on file  Occupational History   Occupation: retired  Tobacco Use   Smoking status: Never   Smokeless tobacco: Never  Vaping Use   Vaping Use: Never used  Substance and Sexual Activity   Alcohol use: No    Alcohol/week: 0.0 standard drinks   Drug use: No   Sexual activity: Not on file  Other Topics Concern   Not on file  Social History Narrative   Married to [place] who has congestive heart failure problems   She is retired never smoker rare alcohol   Social Determinants of Radio broadcast assistant Strain: Low Risk    Difficulty of Paying Living Expenses: Not hard at all  Food Insecurity: Not on file  Transportation Needs: Not on file  Physical Activity: Not on file  Stress: Not on file  Social Connections: Not on file    Family History  Problem Relation Age of Onset   Heart disease Father    Stroke Sister    Colon cancer Neg Hx     Review of Systems  Constitutional:  Negative for fever.  HENT:  Positive for postnasal drip, rhinorrhea (clear), sinus pressure and sore throat  (recently - from drainage). Negative for congestion and ear pain.   Respiratory:  Positive for wheezing (mild wih drainage). Negative for cough and shortness of breath.   Neurological:  Positive for headaches (occ). Negative for dizziness.      Objective:   Vitals:   01/31/21 1031  BP: 140/80  Pulse: 62  Temp: 98.2 F (36.8 C)  SpO2: 97%   BP Readings from Last 3 Encounters:  01/31/21 140/80  01/01/21 122/80  11/30/20 130/74   Wt Readings from Last 3 Encounters:  01/31/21 154 lb (69.9 kg)  01/01/21 155 lb (70.3 kg)  11/30/20 155 lb 9.6 oz (70.6 kg)   Body mass index is 26.43 kg/m.   Physical Exam    GENERAL APPEARANCE: Appears stated age, well appearing, NAD EYES: conjunctiva clear, no icterus HENT: bilateral tympanic membranes and ear canals normal, oropharynx with no erythema or exudates, trachea midline, no cervical or supraclavicular lymphadenopathy LUNGS: Unlabored breathing, good air entry bilaterally, clear to auscultation without wheeze or crackles CARDIOVASCULAR: Normal S1,S2 , no edema SKIN: Warm, dry      Assessment & Plan:    Anxiety: Chronic Intermittent Was on xanax but did not like how it made her feel - did better w/ lorazepam Start lorazepam 0.25 mg at night prn - she does not take it often and only takes it at night  Acute sinus infection: Acute Start zpak which has work well for her in the past Continue allergy medications otc cold medications Rest, fluid Call if no improvement

## 2021-01-31 ENCOUNTER — Other Ambulatory Visit: Payer: Self-pay

## 2021-01-31 ENCOUNTER — Encounter: Payer: Self-pay | Admitting: Internal Medicine

## 2021-01-31 ENCOUNTER — Ambulatory Visit (INDEPENDENT_AMBULATORY_CARE_PROVIDER_SITE_OTHER): Payer: PPO | Admitting: Internal Medicine

## 2021-01-31 VITALS — BP 140/80 | HR 62 | Temp 98.2°F | Ht 64.0 in | Wt 154.0 lb

## 2021-01-31 DIAGNOSIS — J019 Acute sinusitis, unspecified: Secondary | ICD-10-CM

## 2021-01-31 DIAGNOSIS — F419 Anxiety disorder, unspecified: Secondary | ICD-10-CM | POA: Diagnosis not present

## 2021-01-31 DIAGNOSIS — F411 Generalized anxiety disorder: Secondary | ICD-10-CM

## 2021-01-31 MED ORDER — LORAZEPAM 0.5 MG PO TABS: 0.2500 mg | ORAL_TABLET | Freq: Every evening | ORAL | 1 refills | Status: DC | PRN

## 2021-01-31 MED ORDER — AZITHROMYCIN 250 MG PO TABS: ORAL_TABLET | ORAL | 0 refills | Status: DC

## 2021-01-31 NOTE — Patient Instructions (Signed)
    Medications changes include :   zpak and lorazepam  Your prescription(s) have been submitted to your pharmacy. Please take as directed and contact our office if you believe you are having problem(s) with the medication(s).

## 2021-02-04 NOTE — Patient Instructions (Addendum)
Visit Information  Phone number for Pharmacist: [phone]   Goals Addressed             This Visit's Progress    Manage My Medicine       Timeframe:  Long-Range Goal Priority:  Medium Start Date:    08/06/20                         Expected End Date:   08/06/21                    Follow Up Date May 2023   - call for medicine refill 2 or 3 days before it runs out - call if I am sick and can't take my medicine - keep a list of all the medicines I take; vitamins and herbals too - use a pillbox to sort medicine    Why is this important?   These steps will help you keep on track with your medicines.   Notes:         Care Plan : CCM Pharmacy Care Plan  Updates made by Charlton Haws, RPH since 02/04/2021 12:00 AM     Problem: Hypertension, Hyperlipidemia, Coronary Artery Disease, Chronic Kidney Disease, Anxiety, Osteoporosis, and Osteoarthritis   Priority: High     Long-Range Goal: Disease management   Start Date: 08/12/2020  Expected End Date: 08/12/2021  This Visit's Progress: On track  Recent Progress: On track  Priority: High  Note:   Current Barriers:  Unable to independently monitor therapeutic efficacy  Pharmacist Clinical Goal(s):  Patient will achieve adherence to monitoring guidelines and medication adherence to achieve therapeutic efficacy through collaboration with PharmD and provider.   Interventions: 1:1 collaboration with Janith Lima, MD regarding development and update of comprehensive plan of care as evidenced by provider attestation and co-signature Inter-disciplinary care team collaboration (see longitudinal plan of care) Comprehensive medication review performed; medication list updated in electronic medical record  Hypertension (BP goal <140/90) -Controlled - patient reports checking BP multiple times per day and BP fluctuates significantly but on average is at goal -Current home readings: "better since moving losartan to  bedtime" -Current treatment: HCTZ 25 mg daily AM Losartan 50 mg daily PM Metoprolol succinate 25 mg daily AM -Medications previously tried: amlodipine (edema)  -Current exercise habits: walks daily -Denies hypotensive/hypertensive symptoms -Educated on BP goals and benefits of medications for prevention of heart attack, stroke and kidney damage; Importance of home blood pressure monitoring; Proper BP monitoring technique; -Counseled to monitor BP at home daily -Recommended to continue current medication  Hyperlipidemia / CAD: (LDL goal < 70) -Controlled - LDL is at goal; pt endorses compliance and denies issues -hx CAD, s/p DES 2003 -Current treatment: Rosuvastatin 10 mg daily HS Aspirin 81 mg daily -Medications previously tried: simvastatin  -Educated on Cholesterol goals; Benefits of statin for ASCVD risk reduction; -Recommended to continue current medication  Depression/Anxiety (Goal: manage symptoms) -Controlled - pt tries to avoid using alprazolam, she has not used in > 3 months -Current treatment: Alprazolam 0.5 mg TID prn -Medications previously tried/failed: lorazepam -PHQ9: 0 -GAD7: not on file -Connected with PCP for mental health support -Educated on when to use alprazolam; advised continuing to limit use -Recommended to continue current medication  Osteoporosis (Goal: prevent fractures) -Not ideally controlled - pt is a candidate for treatment but has failed bisphosphonate due to side effects and declined Prolia previously due to cost; she is not interested in starting  therapy now -Last DEXA Scan: 03/2017  T-Score total hip: -3.1  T-Score lumbar spine: -2.9 -Patient is a candidate for pharmacologic treatment due to T-Score < -2.5 in total hip  and T-Score < -2.5 in lumbar spine -Current treatment  Calcium 600 mg BID Vitamin D 1000 IU daily -Medications previously tried: Forteo x 8 mos, alendronate (esophagitis), declined Prolia d/t copay -Recommend [phone] units  of vitamin D daily. Recommend 1200 mg of calcium daily from dietary and supplemental sources.  IBS / CIC (Goal: manage symptoms) -Controlled - Patient is satisfied with current regimen and denies issues -Current treatment  Dicyclomine 10 mg QID prn -rare use Docusate 100 mg QOD Probiotic daily (Align) -Recommended to continue current medication  Pain (Goal: manage symptoms) -Controlled - pt reports pain is relatively under control -osteoarthritis, low back pain, peripheral neuropathy -Current treatment  Baclofen 10 mg BID prn - not taking Pregabalin 25 mg daily HS Tylenol 500 mg  -Removed baclofen from med list -Recommended to continue current medication  Health Maintenance -Vaccine gaps: covid booster, Prevnar -Advised to get [employer] (pharmacy or office) and covid booster (pharmacy)  Patient Goals/Self-Care Activities Patient will:  - take medications as prescribed -focus on medication adherence by routine -check blood pressure daily      Patient verbalizes understanding of instructions provided today and agrees to view in MyChart.  Telephone follow up appointment with pharmacy team member scheduled for: 6 months  Charlene Brooke, PharmD, [place], CPP Clinical Pharmacist [facility] [phone]

## 2021-02-27 DIAGNOSIS — M810 Age-related osteoporosis without current pathological fracture: Secondary | ICD-10-CM | POA: Diagnosis not present

## 2021-02-27 DIAGNOSIS — E785 Hyperlipidemia, unspecified: Secondary | ICD-10-CM | POA: Diagnosis not present

## 2021-02-27 DIAGNOSIS — N1831 Chronic kidney disease, stage 3a: Secondary | ICD-10-CM

## 2021-02-27 DIAGNOSIS — I2510 Atherosclerotic heart disease of native coronary artery without angina pectoris: Secondary | ICD-10-CM

## 2021-02-27 DIAGNOSIS — I129 Hypertensive chronic kidney disease with stage 1 through stage 4 chronic kidney disease, or unspecified chronic kidney disease: Secondary | ICD-10-CM

## 2021-02-27 DIAGNOSIS — Z9861 Coronary angioplasty status: Secondary | ICD-10-CM

## 2021-03-07 DIAGNOSIS — R300 Dysuria: Secondary | ICD-10-CM | POA: Diagnosis not present

## 2021-03-18 ENCOUNTER — Other Ambulatory Visit: Payer: Self-pay | Admitting: Orthopedic Surgery

## 2021-03-26 ENCOUNTER — Ambulatory Visit: Payer: PPO | Admitting: Podiatry

## 2021-03-26 ENCOUNTER — Ambulatory Visit (INDEPENDENT_AMBULATORY_CARE_PROVIDER_SITE_OTHER): Payer: PPO

## 2021-03-26 ENCOUNTER — Other Ambulatory Visit: Payer: Self-pay

## 2021-03-26 ENCOUNTER — Ambulatory Visit: Payer: PPO

## 2021-03-26 DIAGNOSIS — M79671 Pain in right foot: Secondary | ICD-10-CM | POA: Diagnosis not present

## 2021-03-26 DIAGNOSIS — M79672 Pain in left foot: Secondary | ICD-10-CM | POA: Diagnosis not present

## 2021-03-26 NOTE — Progress Notes (Signed)
SITUATION Reason for Consult: Evaluation for Bilateral Custom Foot Orthoses Patient / Caregiver Report: Patient reports having had foot orthotics a long time ago and wants new ones because her feet hurt.  OBJECTIVE DATA: Patient History / Diagnosis:    ICD-10-CM   1. Pain in right foot  M79.671       Current or Previous Devices: Custom functional foot orthotics from thirty years ago  Foot Examination: Skin presentation:   Intact Ulcers & Callousing:   None and no history Toe / Foot Deformities:  Pes cavus, hammertoes, bunions Weight Bearing Presentation:  Cavus Sensation:    Intact  ORTHOTIC RECOMMENDATION Recommended Device: 1x pair of custom functional foot orthotics  GOALS OF ORTHOSES - Reduce Pain - Prevent Foot Deformity - Prevent Progression of Further Foot Deformity - Relieve Pressure - Improve the Overall Biomechanical Function of the Foot and Lower Extremity.  ACTIONS PERFORMED Patient was casted for Foot Orthoses via crush box. Procedure was explained and patient tolerated procedure well. All questions were answered and concerns addressed.  PLAN Potential out of pocket cost was communicated to patient. Casts are to be sent to [facility] for fabrication. Patient is to be called for fitting when devices are ready.

## 2021-03-28 NOTE — Progress Notes (Signed)
Subjective:   Patient ID: Krystal [name], female   DOB: 87 y.o.   MRN: [id number]   HPI 87 year old female presents the office today for concerns of right foot pain on the top of her foot rating to the ball of her foot.  She does get some occasional discomfort of the arch of the foot.  This is been intermittent for the last month and has been getting worse.  The symptoms seem to worsen after she stopped wearing her custom inserts which were several years old.  She does get pain at nighttime is also recently started on gabapentin 100 mg at nighttime for concerns of neuropathy.  This has been helping some.  She was not able to tolerate 2 tablets.   Review of Systems  All other systems reviewed and are negative.  Past Medical History:  Diagnosis Date   Adenomatous colon polyp    Allergy    seasonal   Anemia    pt. denies   Anxiety    CAD (coronary artery disease)    Cataract    cataracts removed left eye.   Chronic lower back pain    Diverticulosis    GERD (gastroesophageal reflux disease)    Headache(784.0)    Hiatal hernia    Hyperlipidemia    Hypertension    IBS (irritable bowel syndrome) 10/13/2011   Osteoarthritis    Osteoporosis    Renal cysts, acquired, bilateral    SBO (small bowel obstruction) ([place]) 09/05/2015   Venous insufficiency     Past Surgical History:  Procedure Laterality Date   ABDOMINAL HYSTERECTOMY     BLADDER REPAIR     CARDIAC CATHETERIZATION  01/12/2002   90% stenosis of the left circumflex, stented with a 3x45mm Cypher stent, resulting in reduction of 90% to 10%    CARDIOVASCULAR STRESS TEST  03/22/2007   Mild inferolateral thinning toward the apex without significant ischemia. Nondiagnostic electrocardiogram.   CATARACT EXTRACTION     bilateral   COLONOSCOPY  08/17/2008   adenomatous polyp, diverticulosis, external hemorrhoids   COLONOSCOPY W/ BIOPSIES     multiple    LEFT LOWER EXTREMITY VENOUS DOPPLER  06/18/2011   No evidence of left lower  extremity DVT   TRANSTHORACIC ECHOCARDIOGRAM  11/18/2012   EF 94-76%, LV systolic mild-moderately reduced, mild-moderate mitral valve regurg, mild-moderate tricuspid valve regurg. NSR-LBBB with occas PVCs   UPPER GASTROINTESTINAL ENDOSCOPY  07/03/2010   hiatal hernia   VARICOSE VEIN SURGERY       Current Outpatient Medications:    acetaminophen (TYLENOL) 500 MG tablet, Take 500 mg by mouth every 4 (four) hours as needed for mild pain or headache., Disp: , Rfl:    acetaminophen-codeine (TYLENOL #3) 300-30 MG tablet, Take 1 tablet by mouth daily as needed for moderate pain., Disp: 20 tablet, Rfl: 0   albuterol (VENTOLIN HFA) 108 (90 Base) MCG/ACT inhaler, TAKE 2 PUFFS BY MOUTH EVERY 6 HOURS AS NEEDED FOR WHEEZE OR SHORTNESS OF BREATH, Disp: 8.5 each, Rfl: 1   ascorbic acid (VITAMIN C) 500 MG tablet, Take 500 mg by mouth daily., Disp: , Rfl:    aspirin EC 81 MG tablet, Take 81 mg by mouth daily. Swallow whole., Disp: , Rfl:    azelastine (ASTELIN) 0.1 % nasal spray, Place 2 sprays into both nostrils 2 (two) times daily. Use in each nostril as directed, Disp: 90 mL, Rfl: 1   calcium carbonate (OS-CAL) 600 MG TABS tablet, Take 600 mg by mouth 2 (two) times daily with  a meal., Disp: , Rfl:    cholecalciferol (VITAMIN D) 1000 UNITS tablet, Take 2,000 Units by mouth daily. , Disp: , Rfl:    dicyclomine (BENTYL) 10 MG capsule, Take 1 capsule (10 mg total) by mouth 4 (four) times daily -  before meals and at bedtime. As needed for cramps, Disp: 60 capsule, Rfl: 0   docusate sodium (COLACE) 100 MG capsule, Take 100 mg by mouth every other day., Disp: , Rfl:    fluticasone (FLONASE) 50 MCG/ACT nasal spray, SPRAY 1 SPRAY INTO BOTH NOSTRILS DAILY., Disp: 48 mL, Rfl: 1   gabapentin (NEURONTIN) 100 MG capsule, TAKE 1 CAPSULE BY MOUTH EVERYDAY AT BEDTIME, Disp: 60 capsule, Rfl: 4   hydrochlorothiazide (HYDRODIURIL) 25 MG tablet, Take 1 tablet (25 mg total) by mouth daily., Disp: 90 tablet, Rfl: 1   lansoprazole  (PREVACID) 15 MG capsule, TAKE 1 CAPSULE (15 MG TOTAL) BY MOUTH 2 (TWO) TIMES DAILY BEFORE A MEAL., Disp: 180 capsule, Rfl: 3   loratadine (CLARITIN) 10 MG tablet, Take 1 tablet (10 mg total) by mouth daily., Disp: 90 tablet, Rfl: 3   LORazepam (ATIVAN) 0.5 MG tablet, Take 0.5-1 tablets (0.25-0.5 mg total) by mouth at bedtime as needed for anxiety., Disp: 30 tablet, Rfl: 1   losartan (COZAAR) 50 MG tablet, TAKE 1 TABLET BY MOUTH EVERY DAY, Disp: 90 tablet, Rfl: 0   metoprolol succinate (TOPROL-XL) 25 MG 24 hr tablet, TAKE 1 TABLET (25 MG TOTAL) BY MOUTH DAILY., Disp: 90 tablet, Rfl: 1   ondansetron (ZOFRAN ODT) 4 MG disintegrating tablet, Take 1 tablet (4 mg total) by mouth every 8 (eight) hours as needed for nausea or vomiting., Disp: 20 tablet, Rfl: 0   pregabalin (LYRICA) 25 MG capsule, Take 1 capsule (25 mg total) by mouth at bedtime as needed. 1 po q hs prn, Disp: 30 capsule, Rfl: 0   PREMARIN vaginal cream, APPLY 1 APPLICATORFUL 2 3 TIMES A WEEK AS DIRECTED, Disp: , Rfl:    Probiotic Product (ALIGN) 4 MG CAPS, Take 4 mg by mouth as needed. , Disp: , Rfl:    RESTASIS 0.05 % ophthalmic emulsion, , Disp: , Rfl:    rosuvastatin (CRESTOR) 10 MG tablet, TAKE 1 TABLET BY MOUTH EVERY DAY, Disp: 90 tablet, Rfl: 3   triamcinolone cream (KENALOG) 0.5 %, Apply 1 application topically 3 (three) times daily., Disp: 30 g, Rfl: 0  Allergies  Allergen Reactions   Atorvastatin Other (See Comments)    Muscle aches   Simvastatin Other (See Comments)    Myalgias    Alendronate Sodium     REACTION: pt states INTOL to Fosamax w/ esophagitis   Cefdinir     REACTION: diarrhea   Doxycycline     diarrhea   Esomeprazole Magnesium     REACTION: pt states INTOL to Nexium   Levofloxacin     REACTION: diarrhea   Omeprazole     REACTION: pt states INTOL to Prilosec   Other     Pneumonia vaccine---felt tired and achy and sick x 6 months to get over this.   Penicillins Hives   Amlodipine Other (See Comments)     Ankle edema   Shellfish-Derived Products Hives, Nausea Only and Rash          Objective:  Physical Exam  General: AAO x3, NAD  Dermatological: Skin is warm, dry and supple bilateral.  There are no open sores, no preulcerative lesions, no rash or signs of infection present.  Vascular: Dorsalis Pedis artery  and Posterior Tibial artery pedal pulses are 2/4 bilateral with immedate capillary fill time. There is no pain with calf compression, swelling, warmth, erythema.   Neruologic: Grossly intact via light touch bilateral.   Musculoskeletal: Significant bunions present.  There is prominence of the metatarsals plantarly.  Mild tenderness in the dorsal aspect of the Lisfranc joint but no specific area of pinpoint tenderness.  Flexor, extensor tendons appear to be intact.  Muscular strength 5/5 in all groups tested bilateral.  Gait: Unassisted, Nonantalgic.       Assessment:   Right foot pain, capsulitis, arthritis     Plan:  -Treatment options discussed including all alternatives, risks, and complications -Etiology of symptoms were discussed -X-rays were obtained and reviewed with the patient.  Severe bunions present.  Arthritic changes present Lisfranc joint.  No evidence of acute fracture. -Symptoms symptoms reoccurred after she discontinued her custom orthotics that they are several years old not fitting appropriately.  I do think new custom inserts will be beneficial for her to help with foot pain.  She has measured for new inserts by our orthotist, Aaron Edelman for this.  We we will try to obtain authorization through insurance. -Continue supportive shoe gear for now.  Discussed topical Voltaren to use as needed.  Trula Slade DPM

## 2021-04-03 ENCOUNTER — Encounter: Payer: Self-pay | Admitting: Internal Medicine

## 2021-04-03 ENCOUNTER — Telehealth (INDEPENDENT_AMBULATORY_CARE_PROVIDER_SITE_OTHER): Payer: PPO | Admitting: Internal Medicine

## 2021-04-03 DIAGNOSIS — F411 Generalized anxiety disorder: Secondary | ICD-10-CM

## 2021-04-03 DIAGNOSIS — I10 Essential (primary) hypertension: Secondary | ICD-10-CM

## 2021-04-03 DIAGNOSIS — J301 Allergic rhinitis due to pollen: Secondary | ICD-10-CM

## 2021-04-03 MED ORDER — PREDNISONE 10 MG PO TABS: ORAL_TABLET | ORAL | 0 refills | Status: DC

## 2021-04-03 NOTE — Assessment & Plan Note (Signed)
Overall stable per pt, declines need for change in tx or counseling, pt reassured

## 2021-04-03 NOTE — Assessment & Plan Note (Signed)
BP Readings from Last 3 Encounters:  01/31/21 140/80  01/01/21 122/80  11/30/20 130/74   Pt encouraged to continue to monitor BP on a regular basis, continue current med tx - losartan, toprol

## 2021-04-03 NOTE — Assessment & Plan Note (Signed)
With moderate seasonal flare - for prednisone as per pt request, cont all other tx,  to f/u any worsening symptoms or concerns

## 2021-04-03 NOTE — Patient Instructions (Signed)
Please take all new medication as prescribed 

## 2021-04-03 NOTE — Progress Notes (Signed)
Patient ID: Krystal [name], female   DOB: January 27, 1930, 88 y.o.   MRN: [id number]  Virtual Visit via Video Note  I connected with [name] on 04/03/21 at 10:00 AM EST by a video enabled telemedicine application and verified that I am speaking with the correct person using two identifiers.  Location of all participants today Patient: at home Provider: at office   I discussed the limitations of evaluation and management by telemedicine and the availability of in person appointments. The patient expressed understanding and agreed to proceed.  History of Present Illness: Here to f/u - husband has covid + testing at home yesterday, but she is neg yesterday and today. Denies fever or feeling ill - Does have several wks ongoing nasal allergy symptoms with clearish congestion, itch and sneezing, without fever, pain, ST, cough, swelling or wheezing - asks for prednisone  Denies worsening reflux, abd pain, dysphagia, n/v, bowel change or blood.  Denies worsening depressive symptoms, suicidal ideation, or panic Past Medical History:  Diagnosis Date   Adenomatous colon polyp    Allergy    seasonal   Anemia    pt. denies   Anxiety    CAD (coronary artery disease)    Cataract    cataracts removed left eye.   Chronic lower back pain    Diverticulosis    GERD (gastroesophageal reflux disease)    Headache(784.0)    Hiatal hernia    Hyperlipidemia    Hypertension    IBS (irritable bowel syndrome) 10/13/2011   Osteoarthritis    Osteoporosis    Renal cysts, acquired, bilateral    SBO (small bowel obstruction) ([place]) 09/05/2015   Venous insufficiency    Past Surgical History:  Procedure Laterality Date   ABDOMINAL HYSTERECTOMY     BLADDER REPAIR     CARDIAC CATHETERIZATION  01/12/2002   90% stenosis of the left circumflex, stented with a 3x56mm Cypher stent, resulting in reduction of 90% to 10%    CARDIOVASCULAR STRESS TEST  03/22/2007   Mild inferolateral thinning toward the apex without  significant ischemia. Nondiagnostic electrocardiogram.   CATARACT EXTRACTION     bilateral   COLONOSCOPY  08/17/2008   adenomatous polyp, diverticulosis, external hemorrhoids   COLONOSCOPY W/ BIOPSIES     multiple    LEFT LOWER EXTREMITY VENOUS DOPPLER  06/18/2011   No evidence of left lower extremity DVT   TRANSTHORACIC ECHOCARDIOGRAM  11/18/2012   EF 78-67%, LV systolic mild-moderately reduced, mild-moderate mitral valve regurg, mild-moderate tricuspid valve regurg. NSR-LBBB with occas PVCs   UPPER GASTROINTESTINAL ENDOSCOPY  07/03/2010   hiatal hernia   VARICOSE VEIN SURGERY      reports that she has never smoked. She has never used smokeless tobacco. She reports that she does not drink alcohol and does not use drugs. family history includes Heart disease in her father; Stroke in her sister. Allergies  Allergen Reactions   Atorvastatin Other (See Comments)    Muscle aches   Simvastatin Other (See Comments)    Myalgias    Alendronate Sodium     REACTION: pt states INTOL to Fosamax w/ esophagitis   Cefdinir     REACTION: diarrhea   Doxycycline     diarrhea   Esomeprazole Magnesium     REACTION: pt states INTOL to Nexium   Levofloxacin     REACTION: diarrhea   Omeprazole     REACTION: pt states INTOL to Prilosec   Other     Pneumonia vaccine---felt tired and achy and  sick x 6 months to get over this.   Penicillins Hives   Amlodipine Other (See Comments)    Ankle edema   Shellfish-Derived Products Hives, Nausea Only and Rash   Current Outpatient Medications on File Prior to Visit  Medication Sig Dispense Refill   acetaminophen (TYLENOL) 500 MG tablet Take 500 mg by mouth every 4 (four) hours as needed for mild pain or headache.     acetaminophen-codeine (TYLENOL #3) 300-30 MG tablet Take 1 tablet by mouth daily as needed for moderate pain. 20 tablet 0   albuterol (VENTOLIN HFA) 108 (90 Base) MCG/ACT inhaler TAKE 2 PUFFS BY MOUTH EVERY 6 HOURS AS NEEDED FOR WHEEZE OR SHORTNESS  OF BREATH 8.5 each 1   ascorbic acid (VITAMIN C) 500 MG tablet Take 500 mg by mouth daily.     aspirin EC 81 MG tablet Take 81 mg by mouth daily. Swallow whole.     azelastine (ASTELIN) 0.1 % nasal spray Place 2 sprays into both nostrils 2 (two) times daily. Use in each nostril as directed 90 mL 1   calcium carbonate (OS-CAL) 600 MG TABS tablet Take 600 mg by mouth 2 (two) times daily with a meal.     cholecalciferol (VITAMIN D) 1000 UNITS tablet Take 2,000 Units by mouth daily.      dicyclomine (BENTYL) 10 MG capsule Take 1 capsule (10 mg total) by mouth 4 (four) times daily -  before meals and at bedtime. As needed for cramps 60 capsule 0   docusate sodium (COLACE) 100 MG capsule Take 100 mg by mouth every other day.     fluticasone (FLONASE) 50 MCG/ACT nasal spray SPRAY 1 SPRAY INTO BOTH NOSTRILS DAILY. 48 mL 1   gabapentin (NEURONTIN) 100 MG capsule TAKE 1 CAPSULE BY MOUTH EVERYDAY AT BEDTIME 60 capsule 4   hydrochlorothiazide (HYDRODIURIL) 25 MG tablet Take 1 tablet (25 mg total) by mouth daily. 90 tablet 1   lansoprazole (PREVACID) 15 MG capsule TAKE 1 CAPSULE (15 MG TOTAL) BY MOUTH 2 (TWO) TIMES DAILY BEFORE A MEAL. 180 capsule 3   loratadine (CLARITIN) 10 MG tablet Take 1 tablet (10 mg total) by mouth daily. 90 tablet 3   LORazepam (ATIVAN) 0.5 MG tablet Take 0.5-1 tablets (0.25-0.5 mg total) by mouth at bedtime as needed for anxiety. 30 tablet 1   losartan (COZAAR) 50 MG tablet TAKE 1 TABLET BY MOUTH EVERY DAY 90 tablet 0   metoprolol succinate (TOPROL-XL) 25 MG 24 hr tablet TAKE 1 TABLET (25 MG TOTAL) BY MOUTH DAILY. 90 tablet 1   ondansetron (ZOFRAN ODT) 4 MG disintegrating tablet Take 1 tablet (4 mg total) by mouth every 8 (eight) hours as needed for nausea or vomiting. 20 tablet 0   pregabalin (LYRICA) 25 MG capsule Take 1 capsule (25 mg total) by mouth at bedtime as needed. 1 po q hs prn 30 capsule 0   PREMARIN vaginal cream APPLY 1 APPLICATORFUL 2 3 TIMES A WEEK AS DIRECTED      Probiotic Product (ALIGN) 4 MG CAPS Take 4 mg by mouth as needed.      RESTASIS 0.05 % ophthalmic emulsion      rosuvastatin (CRESTOR) 10 MG tablet TAKE 1 TABLET BY MOUTH EVERY DAY 90 tablet 3   triamcinolone cream (KENALOG) 0.5 % Apply 1 application topically 3 (three) times daily. 30 g 0   No current facility-administered medications on file prior to visit.    Observations/Objective: Alert, NAD, appropriate mood and affect, resps normal, cn  2-12 intact, moves all 4s, no visible rash or swelling Lab Results  Component Value Date   WBC 9.2 05/08/2020   HGB 13.9 05/08/2020   HCT 42.0 05/08/2020   PLT 231.0 05/08/2020   GLUCOSE 84 05/08/2020   CHOL 139 05/08/2020   TRIG 146.0 05/08/2020   HDL 47.90 05/08/2020   LDLDIRECT 160.4 12/19/2008   LDLCALC 62 05/08/2020   ALT 16 05/08/2020   AST 19 05/08/2020   NA 140 05/08/2020   K 3.8 05/08/2020   CL 103 05/08/2020   CREATININE 1.00 05/08/2020   BUN 24 (H) 05/08/2020   CO2 33 (H) 05/08/2020   TSH 1.56 05/08/2020   HGBA1C 6.1 05/08/2020   Assessment and Plan: See notes  Follow Up Instructions: See notes   I discussed the assessment and treatment plan with the patient. The patient was provided an opportunity to ask questions and all were answered. The patient agreed with the plan and demonstrated an understanding of the instructions.   The patient was advised to call back or seek an in-person evaluation if the symptoms worsen or if the condition fails to improve as anticipated.   Cathlean Cower, MD

## 2021-04-04 ENCOUNTER — Telehealth: Payer: Self-pay | Admitting: Internal Medicine

## 2021-04-04 ENCOUNTER — Other Ambulatory Visit: Payer: Self-pay | Admitting: Internal Medicine

## 2021-04-04 ENCOUNTER — Encounter: Payer: Self-pay | Admitting: Internal Medicine

## 2021-04-04 MED ORDER — NIRMATRELVIR/RITONAVIR (PAXLOVID)TABLET: 3.0000 | ORAL_TABLET | Freq: Two times a day (BID) | ORAL | 0 refills | Status: AC

## 2021-04-04 NOTE — Telephone Encounter (Signed)
Patient states she and spouse Melani Brisbane  had a vv w/ Dr. Jenny Reichmann yesterday and spouse was prescribed antivirals due to being covid +  Patient states she tested positive for covid today and would like to request a rx for antivirals  Patient also inquiring if she should discontinue using predniSONE (DELTASONE) 10 MG tablet  Please advise

## 2021-04-05 ENCOUNTER — Telehealth: Payer: Self-pay | Admitting: Podiatry

## 2021-04-05 NOTE — Telephone Encounter (Signed)
Received [employer] auth # [id number] for orthotics [phone] X2) valid 12.27.2022 thru 3.27.2023

## 2021-04-10 ENCOUNTER — Other Ambulatory Visit: Payer: Self-pay | Admitting: Internal Medicine

## 2021-04-10 DIAGNOSIS — I10 Essential (primary) hypertension: Secondary | ICD-10-CM

## 2021-04-10 DIAGNOSIS — I2510 Atherosclerotic heart disease of native coronary artery without angina pectoris: Secondary | ICD-10-CM

## 2021-04-17 ENCOUNTER — Other Ambulatory Visit: Payer: Self-pay | Admitting: Internal Medicine

## 2021-04-17 DIAGNOSIS — I10 Essential (primary) hypertension: Secondary | ICD-10-CM

## 2021-04-23 ENCOUNTER — Telehealth: Payer: Self-pay | Admitting: Internal Medicine

## 2021-04-23 ENCOUNTER — Ambulatory Visit: Payer: PPO

## 2021-04-23 ENCOUNTER — Other Ambulatory Visit: Payer: Self-pay

## 2021-04-23 DIAGNOSIS — M79671 Pain in right foot: Secondary | ICD-10-CM

## 2021-04-23 DIAGNOSIS — I10 Essential (primary) hypertension: Secondary | ICD-10-CM

## 2021-04-23 NOTE — Progress Notes (Signed)
SITUATION: Reason for Visit: Fitting and Delivery of Custom Fabricated Foot Orthoses Patient Report: Patient reports comfort and is satisfied with device.  OBJECTIVE DATA: Patient History / Diagnosis:     ICD-10-CM   1. Pain in right foot  M79.671       Provided Device:  Custom Functional Foot Orthotics  GOAL OF ORTHOSIS - Improve gait - Decrease energy expenditure - Improve Balance - Provide Triplanar stability of foot complex - Facilitate motion  ACTIONS PERFORMED Patient was fit with foot orthotics trimmed to shoe last. Patient tolerated fittign procedure.   Patient was provided with verbal and written instruction and demonstration regarding donning, doffing, wear, care, proper fit, function, purpose, cleaning, and use of the orthosis and in all related precautions and risks and benefits regarding the orthosis.  Patient was also provided with verbal instruction regarding how to report any failures or malfunctions of the orthosis and necessary follow up care. Patient was also instructed to contact our office regarding any change in status that may affect the function of the orthosis.  Patient demonstrated independence with proper donning, doffing, and fit and verbalized understanding of all instructions.  PLAN: Patient is to follow up in one week or as necessary (PRN). All questions were answered and concerns addressed. Plan of care was discussed with and agreed upon by the patient.

## 2021-04-26 ENCOUNTER — Other Ambulatory Visit: Payer: Self-pay | Admitting: Internal Medicine

## 2021-04-26 DIAGNOSIS — I10 Essential (primary) hypertension: Secondary | ICD-10-CM

## 2021-04-26 MED ORDER — HYDROCHLOROTHIAZIDE 25 MG PO TABS: 25.0000 mg | ORAL_TABLET | Freq: Every day | ORAL | 0 refills | Status: DC

## 2021-04-26 NOTE — Telephone Encounter (Signed)
Pt requesting a short supply of hydrochlorothiazide (HYDRODIURIL) 25 MG tablet until her next appt on 06-11-2021  Pt states her bp has been fluctuating, offered pt sooner appt w/ another [facility] provider, pt declined

## 2021-04-30 ENCOUNTER — Other Ambulatory Visit: Payer: Self-pay | Admitting: Internal Medicine

## 2021-05-13 NOTE — Progress Notes (Signed)
Benito Mccreedy D.[place] [place] Phone: [phone]   Assessment and Plan:     1. Arthritis of carpometacarpal [facility]) joint of right thumb -Chronic with exacerbation, initial sports medicine visit - Chronic right thumb pain localized to [facility] joint with CMC arthritis seen on prior x-ray from 05/2020, and patient experiencing worsening pain - Patient elects for [facility] CSI.  Tolerated well per note below - Recommend Tylenol 500 mg 2-3 times a day as needed for pain control, Voltaren gel as needed for pain control, warm hand baths as needed for pain control  Procedure: Carpometacarpal Joint Injection Side: Right Diagnosis: Acute flare of CMC OA    After explaining the procedure, viable alternatives, risks, and answering any questions, consent was given verbally. The site was cleaned with alcohol prep.  A steroid injection was performed   using 0.51ml of 1% lidocaine without epinephrine and 20 mg of Kenalog 40. This was well tolerated and resulted in  relief.  Needle was removed and dressing placed and post injection instructions were given including  a discussion of likely return of pain today after the anesthetic wears off (with the possibility of worsened pain) until the steroid starts to work in 1-3 days.   Pt was advised to call or return to clinic if these symptoms worsen or fail to improve as anticipated.    Pertinent previous records reviewed include right hand x-ray 06/13/2020   Follow Up: In 3 weeks for reevaluation.  If patient has incomplete relief, could consider repeat [facility] under ultrasound guidance   Subjective:   I, Moenique Parris, am serving as a Education administrator for Doctor Glennon Mac  Chief Complaint: Right thumb pain   HPI:  05/14/2021 Patient is a 88 year old female complaining of right thumb pain. Patient states that it bother in the joints ,its so bad that she cant use it been going on for awhile , no MOI ,  hurts with ADLs, states its like a toothache, no numbness or tingling , does get radiating pain sometimes not recently that will go up the arm, has been taking tylenol, helps for a little but then the pain is right back    Relevant Historical Information: Hypertension, LBBB, GERD, IBS, osteoporosis  Additional pertinent review of systems negative.   Current Outpatient Medications:    acetaminophen (TYLENOL) 500 MG tablet, Take 500 mg by mouth every 4 (four) hours as needed for mild pain or headache., Disp: , Rfl:    albuterol (VENTOLIN HFA) 108 (90 Base) MCG/ACT inhaler, TAKE 2 PUFFS BY MOUTH EVERY 6 HOURS AS NEEDED FOR WHEEZE OR SHORTNESS OF BREATH, Disp: 8.5 each, Rfl: 1   ascorbic acid (VITAMIN C) 500 MG tablet, Take 500 mg by mouth daily., Disp: , Rfl:    aspirin EC 81 MG tablet, Take 81 mg by mouth daily. Swallow whole., Disp: , Rfl:    azelastine (ASTELIN) 0.1 % nasal spray, Place 2 sprays into both nostrils 2 (two) times daily. Use in each nostril as directed, Disp: 90 mL, Rfl: 1   calcium carbonate (OS-CAL) 600 MG TABS tablet, Take 600 mg by mouth 2 (two) times daily with a meal., Disp: , Rfl:    cholecalciferol (VITAMIN D) 1000 UNITS tablet, Take 2,000 Units by mouth daily. , Disp: , Rfl:    dicyclomine (BENTYL) 10 MG capsule, Take 1 capsule (10 mg total) by mouth 4 (four) times daily -  before meals and at bedtime. As  needed for cramps, Disp: 60 capsule, Rfl: 0   docusate sodium (COLACE) 100 MG capsule, Take 100 mg by mouth every other day., Disp: , Rfl:    fluticasone (FLONASE) 50 MCG/ACT nasal spray, SPRAY 1 SPRAY INTO BOTH NOSTRILS DAILY., Disp: 48 mL, Rfl: 1   gabapentin (NEURONTIN) 100 MG capsule, TAKE 1 CAPSULE BY MOUTH EVERYDAY AT BEDTIME, Disp: 60 capsule, Rfl: 4   hydrochlorothiazide (HYDRODIURIL) 25 MG tablet, Take 1 tablet (25 mg total) by mouth daily., Disp: 90 tablet, Rfl: 0   lansoprazole (PREVACID) 15 MG capsule, TAKE 1 CAPSULE (15 MG TOTAL) BY MOUTH 2 (TWO) TIMES DAILY  BEFORE A MEAL., Disp: 180 capsule, Rfl: 3   loratadine (CLARITIN) 10 MG tablet, Take 1 tablet (10 mg total) by mouth daily., Disp: 90 tablet, Rfl: 3   LORazepam (ATIVAN) 0.5 MG tablet, Take 0.5-1 tablets (0.25-0.5 mg total) by mouth at bedtime as needed for anxiety., Disp: 30 tablet, Rfl: 1   losartan (COZAAR) 50 MG tablet, TAKE 1 TABLET BY MOUTH EVERY DAY, Disp: 90 tablet, Rfl: 0   metoprolol succinate (TOPROL-XL) 25 MG 24 hr tablet, TAKE 1 TABLET (25 MG TOTAL) BY MOUTH DAILY., Disp: 90 tablet, Rfl: 1   ondansetron (ZOFRAN ODT) 4 MG disintegrating tablet, Take 1 tablet (4 mg total) by mouth every 8 (eight) hours as needed for nausea or vomiting., Disp: 20 tablet, Rfl: 0   predniSONE (DELTASONE) 10 MG tablet, 3 TABS BY MOUTH PER DAY FOR 3 DAYS,2TABS PER DAY FOR 3 DAYS,1TAB PER DAY FOR 3 DAYS, Disp: 18 tablet, Rfl: 0   pregabalin (LYRICA) 25 MG capsule, Take 1 capsule (25 mg total) by mouth at bedtime as needed. 1 po q hs prn, Disp: 30 capsule, Rfl: 0   PREMARIN vaginal cream, APPLY 1 APPLICATORFUL 2 3 TIMES A WEEK AS DIRECTED, Disp: , Rfl:    Probiotic Product (ALIGN) 4 MG CAPS, Take 4 mg by mouth as needed. , Disp: , Rfl:    RESTASIS 0.05 % ophthalmic emulsion, , Disp: , Rfl:    rosuvastatin (CRESTOR) 10 MG tablet, TAKE 1 TABLET BY MOUTH EVERY DAY, Disp: 90 tablet, Rfl: 3   triamcinolone cream (KENALOG) 0.5 %, Apply 1 application topically 3 (three) times daily., Disp: 30 g, Rfl: 0   Objective:     Vitals:   05/14/21 0947  BP: 120/80  Pulse: 73  SpO2: 98%  Weight: 154 lb (69.9 kg)  Height: 5\' 4"  (1.626 m)      Body mass index is 26.43 kg/m.    Physical Exam:    General: Appears well, nad, nontoxic and pleasant Neuro:sensation intact, strength is 5/5 with df/pf/inv/ev, muscle tone wnl Skin:no susupicious lesions or rashes  Right hand/wrist:  No deformity or swelling appreciated.  Generalized decrease musculature similar bilaterally. Wrist ROM  Ext 90, flexion70, radial/ulnar  deviation 30 TTP CMC nttp over the snauff box, dorsal carpals, volar carpals, radial styloid, ulnar styloid, 1st mcp, tfcc Negative Tinel's Negative finklestein No pain with resisted ext, flex Mild tenderness with resisted ulnar deviation of first digit   Electronically signed by:  Benito Mccreedy D.[facility] 10:24 AM 05/14/21

## 2021-05-14 ENCOUNTER — Ambulatory Visit: Payer: PPO | Admitting: Sports Medicine

## 2021-05-14 ENCOUNTER — Other Ambulatory Visit: Payer: Self-pay

## 2021-05-14 VITALS — BP 120/80 | HR 73 | Ht 64.0 in | Wt 154.0 lb

## 2021-05-14 DIAGNOSIS — M1811 Unilateral primary osteoarthritis of first carpometacarpal joint, right hand: Secondary | ICD-10-CM

## 2021-05-14 NOTE — Patient Instructions (Addendum)
Good to see you  Recommend warm hand baths  Recommend Voltaren gel as needed for pain  Tylenol 500 mg 2-3 times a day for pain relief  3 week follow up

## 2021-05-17 ENCOUNTER — Other Ambulatory Visit: Payer: Self-pay | Admitting: Internal Medicine

## 2021-05-17 DIAGNOSIS — I10 Essential (primary) hypertension: Secondary | ICD-10-CM

## 2021-06-03 NOTE — Progress Notes (Deleted)
? ? Benito Mccreedy D.Merril Abbe ?[facility] ?[place] ?Phone: [phone] ?  ?Assessment and Plan:   ?  ?There are no diagnoses linked to this encounter.  ?*** ?  ?Pertinent previous records reviewed include *** ?  ?Follow Up: ***  ? ?  ?Subjective:   ?I, Pincus Badder, am serving as a Education administrator for Doctor [employer] ? ?Chief Complaint: right thumb pain  ? ?HPI:  ?05/14/2021 ?Patient is a 88 year old female complaining of right thumb pain. Patient states that it bother in the joints ,its so bad that she cant use it been going on for awhile , no MOI , hurts with ADLs, states its like a toothache, no numbness or tingling , does get radiating pain sometimes not recently that will go up the arm, has been taking tylenol, helps for a little but then the pain is right back   ? ?3/7/023 ?Patient states ? ? ?  ?Relevant Historical Information: Hypertension, LBBB, GERD, IBS, osteoporosis ? ?Additional pertinent review of systems negative. ? ? ?Current Outpatient Medications:  ?  acetaminophen (TYLENOL) 500 MG tablet, Take 500 mg by mouth every 4 (four) hours as needed for mild pain or headache., Disp: , Rfl:  ?  albuterol (VENTOLIN HFA) 108 (90 Base) MCG/ACT inhaler, TAKE 2 PUFFS BY MOUTH EVERY 6 HOURS AS NEEDED FOR WHEEZE OR SHORTNESS OF BREATH, Disp: 8.5 each, Rfl: 1 ?  ascorbic acid (VITAMIN C) 500 MG tablet, Take 500 mg by mouth daily., Disp: , Rfl:  ?  aspirin EC 81 MG tablet, Take 81 mg by mouth daily. Swallow whole., Disp: , Rfl:  ?  azelastine (ASTELIN) 0.1 % nasal spray, Place 2 sprays into both nostrils 2 (two) times daily. Use in each nostril as directed, Disp: 90 mL, Rfl: 1 ?  calcium carbonate (OS-CAL) 600 MG TABS tablet, Take 600 mg by mouth 2 (two) times daily with a meal., Disp: , Rfl:  ?  cholecalciferol (VITAMIN D) 1000 UNITS tablet, Take 2,000 Units by mouth daily. , Disp: , Rfl:  ?  dicyclomine (BENTYL) 10 MG capsule, Take 1 capsule (10 mg total) by  mouth 4 (four) times daily -  before meals and at bedtime. As needed for cramps, Disp: 60 capsule, Rfl: 0 ?  docusate sodium (COLACE) 100 MG capsule, Take 100 mg by mouth every other day., Disp: , Rfl:  ?  fluticasone (FLONASE) 50 MCG/ACT nasal spray, SPRAY 1 SPRAY INTO BOTH NOSTRILS DAILY., Disp: 48 mL, Rfl: 1 ?  gabapentin (NEURONTIN) 100 MG capsule, TAKE 1 CAPSULE BY MOUTH EVERYDAY AT BEDTIME, Disp: 60 capsule, Rfl: 4 ?  hydrochlorothiazide (HYDRODIURIL) 25 MG tablet, Take 1 tablet (25 mg total) by mouth daily., Disp: 90 tablet, Rfl: 0 ?  lansoprazole (PREVACID) 15 MG capsule, TAKE 1 CAPSULE (15 MG TOTAL) BY MOUTH 2 (TWO) TIMES DAILY BEFORE A MEAL., Disp: 180 capsule, Rfl: 3 ?  loratadine (CLARITIN) 10 MG tablet, Take 1 tablet (10 mg total) by mouth daily., Disp: 90 tablet, Rfl: 3 ?  LORazepam (ATIVAN) 0.5 MG tablet, Take 0.5-1 tablets (0.25-0.5 mg total) by mouth at bedtime as needed for anxiety., Disp: 30 tablet, Rfl: 1 ?  losartan (COZAAR) 50 MG tablet, TAKE 1 TABLET BY MOUTH EVERY DAY, Disp: 90 tablet, Rfl: 0 ?  metoprolol succinate (TOPROL-XL) 25 MG 24 hr tablet, TAKE 1 TABLET (25 MG TOTAL) BY MOUTH DAILY., Disp: 90 tablet, Rfl: 1 ?  ondansetron (ZOFRAN ODT) 4 MG disintegrating  tablet, Take 1 tablet (4 mg total) by mouth every 8 (eight) hours as needed for nausea or vomiting., Disp: 20 tablet, Rfl: 0 ?  predniSONE (DELTASONE) 10 MG tablet, 3 TABS BY MOUTH PER DAY FOR 3 DAYS,2TABS PER DAY FOR 3 DAYS,1TAB PER DAY FOR 3 DAYS, Disp: 18 tablet, Rfl: 0 ?  pregabalin (LYRICA) 25 MG capsule, Take 1 capsule (25 mg total) by mouth at bedtime as needed. 1 po q hs prn, Disp: 30 capsule, Rfl: 0 ?  PREMARIN vaginal cream, APPLY 1 APPLICATORFUL 2 3 TIMES A WEEK AS DIRECTED, Disp: , Rfl:  ?  Probiotic Product (ALIGN) 4 MG CAPS, Take 4 mg by mouth as needed. , Disp: , Rfl:  ?  RESTASIS 0.05 % ophthalmic emulsion, , Disp: , Rfl:  ?  rosuvastatin (CRESTOR) 10 MG tablet, TAKE 1 TABLET BY MOUTH EVERY DAY, Disp: 90 tablet, Rfl:  3 ?  triamcinolone cream (KENALOG) 0.5 %, Apply 1 application topically 3 (three) times daily., Disp: 30 g, Rfl: 0  ? ?Objective:   ?  ?There were no vitals filed for this visit.  ?  ?There is no height or weight on file to calculate BMI.  ?  ?Physical Exam:   ? ?*** ? ? ?Electronically signed by:  ?Benito Mccreedy D.Merril Abbe ?[facility] ?12:58 PM 06/03/21 ?

## 2021-06-04 ENCOUNTER — Ambulatory Visit: Payer: PPO | Admitting: Sports Medicine

## 2021-06-11 ENCOUNTER — Ambulatory Visit: Payer: PPO | Admitting: Internal Medicine

## 2021-06-26 ENCOUNTER — Ambulatory Visit (INDEPENDENT_AMBULATORY_CARE_PROVIDER_SITE_OTHER): Payer: PPO | Admitting: Internal Medicine

## 2021-06-26 ENCOUNTER — Encounter: Payer: Self-pay | Admitting: Internal Medicine

## 2021-06-26 VITALS — BP 160/90 | HR 74 | Temp 98.4°F | Ht 64.0 in | Wt 155.0 lb

## 2021-06-26 DIAGNOSIS — G8929 Other chronic pain: Secondary | ICD-10-CM

## 2021-06-26 DIAGNOSIS — G2581 Restless legs syndrome: Secondary | ICD-10-CM | POA: Diagnosis not present

## 2021-06-26 DIAGNOSIS — I10 Essential (primary) hypertension: Secondary | ICD-10-CM | POA: Diagnosis not present

## 2021-06-26 DIAGNOSIS — N281 Cyst of kidney, acquired: Secondary | ICD-10-CM

## 2021-06-26 DIAGNOSIS — M792 Neuralgia and neuritis, unspecified: Secondary | ICD-10-CM

## 2021-06-26 DIAGNOSIS — R7303 Prediabetes: Secondary | ICD-10-CM | POA: Diagnosis not present

## 2021-06-26 DIAGNOSIS — I2510 Atherosclerotic heart disease of native coronary artery without angina pectoris: Secondary | ICD-10-CM

## 2021-06-26 DIAGNOSIS — Z0001 Encounter for general adult medical examination with abnormal findings: Secondary | ICD-10-CM

## 2021-06-26 DIAGNOSIS — N1831 Chronic kidney disease, stage 3a: Secondary | ICD-10-CM

## 2021-06-26 DIAGNOSIS — E785 Hyperlipidemia, unspecified: Secondary | ICD-10-CM | POA: Diagnosis not present

## 2021-06-26 DIAGNOSIS — R252 Cramp and spasm: Secondary | ICD-10-CM

## 2021-06-26 DIAGNOSIS — F411 Generalized anxiety disorder: Secondary | ICD-10-CM

## 2021-06-26 LAB — HEPATIC FUNCTION PANEL
ALT: 13 U/L (ref 0–35)
AST: 19 U/L (ref 0–37)
Albumin: 4.1 g/dL (ref 3.5–5.2)
Alkaline Phosphatase: 92 U/L (ref 39–117)
Bilirubin, Direct: 0.1 mg/dL (ref 0.0–0.3)
Total Bilirubin: 0.4 mg/dL (ref 0.2–1.2)
Total Protein: 6.6 g/dL (ref 6.0–8.3)

## 2021-06-26 LAB — LIPID PANEL
Cholesterol: 139 mg/dL (ref 0–200)
HDL: 45.8 mg/dL (ref >39.00)
LDL Cholesterol: 73 mg/dL (ref 0–99)
NonHDL: 93.29
Total CHOL/HDL Ratio: 3
Triglycerides: 101 mg/dL (ref 0.0–149.0)
VLDL: 20.2 mg/dL (ref 0.0–40.0)

## 2021-06-26 LAB — CBC WITH DIFFERENTIAL/PLATELET
Basophils Absolute: 0 10*3/uL (ref 0.0–0.1)
Basophils Relative: 0.3 % (ref 0.0–3.0)
Eosinophils Absolute: 0.2 10*3/uL (ref 0.0–0.7)
Eosinophils Relative: 2.1 % (ref 0.0–5.0)
HCT: 41.4 % (ref 36.0–46.0)
Hemoglobin: 13.6 g/dL (ref 12.0–15.0)
Lymphocytes Relative: 30.1 % (ref 12.0–46.0)
Lymphs Abs: 2.2 10*3/uL (ref 0.7–4.0)
MCHC: 32.8 g/dL (ref 30.0–36.0)
MCV: 94.1 fl (ref 78.0–100.0)
Monocytes Absolute: 0.6 10*3/uL (ref 0.1–1.0)
Monocytes Relative: 7.6 % (ref 3.0–12.0)
Neutro Abs: 4.5 10*3/uL (ref 1.4–7.7)
Neutrophils Relative %: 59.9 % (ref 43.0–77.0)
Platelets: 203 10*3/uL (ref 150.0–400.0)
RBC: 4.41 Mil/uL (ref 3.87–5.11)
RDW: 14 % (ref 11.5–15.5)
WBC: 7.5 10*3/uL (ref 4.0–10.5)

## 2021-06-26 LAB — BASIC METABOLIC PANEL
BUN: 20 mg/dL (ref 6–23)
CO2: 29 mEq/L (ref 19–32)
Calcium: 9.5 mg/dL (ref 8.4–10.5)
Chloride: 100 mEq/L (ref 96–112)
Creatinine, Ser: 0.91 mg/dL (ref 0.40–1.20)
GFR: 55.97 mL/min — ABNORMAL LOW (ref >60.00)
Glucose, Bld: 98 mg/dL (ref 70–99)
Potassium: 4 mEq/L (ref 3.5–5.1)
Sodium: 139 mEq/L (ref 135–145)

## 2021-06-26 LAB — MAGNESIUM: Magnesium: 2 mg/dL (ref 1.5–2.5)

## 2021-06-26 LAB — HEMOGLOBIN A1C: Hgb A1c MFr Bld: 6.1 % (ref 4.6–6.5)

## 2021-06-26 LAB — CK: Total CK: 79 U/L (ref 7–177)

## 2021-06-26 LAB — TSH: TSH: 1.61 u[IU]/mL (ref 0.35–5.50)

## 2021-06-26 MED ORDER — LORAZEPAM 0.5 MG PO TABS: 0.5000 mg | ORAL_TABLET | Freq: Every evening | ORAL | 1 refills | Status: DC | PRN

## 2021-06-26 MED ORDER — LOSARTAN POTASSIUM 50 MG PO TABS: 50.0000 mg | ORAL_TABLET | Freq: Every day | ORAL | 0 refills | Status: DC

## 2021-06-26 MED ORDER — GABAPENTIN 100 MG PO CAPS: 100.0000 mg | ORAL_CAPSULE | Freq: Every day | ORAL | 1 refills | Status: DC

## 2021-06-26 MED ORDER — HYDROCHLOROTHIAZIDE 25 MG PO TABS: 25.0000 mg | ORAL_TABLET | Freq: Every day | ORAL | 0 refills | Status: DC

## 2021-06-26 MED ORDER — ROSUVASTATIN CALCIUM 10 MG PO TABS: 10.0000 mg | ORAL_TABLET | Freq: Every day | ORAL | 1 refills | Status: DC

## 2021-06-26 NOTE — Patient Instructions (Signed)

## 2021-06-26 NOTE — Progress Notes (Signed)
? ?Subjective:  ?Patient ID: Krystal [name], female    DOB: 1929/04/29  Age: 88 y.o. MRN: [id number] ? ?CC: Annual Exam, Hypertension, and Hyperlipidemia ? ?This visit occurred during the SARS-CoV-2 public health emergency.  Safety protocols were in place, including screening questions prior to the visit, additional usage of staff PPE, and extensive cleaning of exam room while observing appropriate contact time as indicated for disinfecting solutions.   ? ?HPI ?Krystal [name] presents for a CPX and f/up - ? ?She is compliant with the ARB and thiazide diuretic but she is concerned that her blood pressure is not adequately well controlled.  She walks daily and denies headache, blurred vision, chest pain, shortness of breath, diaphoresis, dizziness, lightheadedness, or edema. ? ? ?Outpatient Medications Prior to Visit  ?Medication Sig Dispense Refill  ? acetaminophen (TYLENOL) 500 MG tablet Take 500 mg by mouth every 4 (four) hours as needed for mild pain or headache.    ? albuterol (VENTOLIN HFA) 108 (90 Base) MCG/ACT inhaler TAKE 2 PUFFS BY MOUTH EVERY 6 HOURS AS NEEDED FOR WHEEZE OR SHORTNESS OF BREATH 8.5 each 1  ? aspirin EC 81 MG tablet Take 81 mg by mouth daily. Swallow whole.    ? azelastine (ASTELIN) 0.1 % nasal spray Place 2 sprays into both nostrils 2 (two) times daily. Use in each nostril as directed 90 mL 1  ? calcium carbonate (OS-CAL) 600 MG TABS tablet Take 600 mg by mouth 2 (two) times daily with a meal.    ? cholecalciferol (VITAMIN D) 1000 UNITS tablet Take 2,000 Units by mouth daily.     ? docusate sodium (COLACE) 100 MG capsule Take 100 mg by mouth every other day.    ? fluticasone (FLONASE) 50 MCG/ACT nasal spray SPRAY 1 SPRAY INTO BOTH NOSTRILS DAILY. 48 mL 1  ? lansoprazole (PREVACID) 15 MG capsule TAKE 1 CAPSULE (15 MG TOTAL) BY MOUTH 2 (TWO) TIMES DAILY BEFORE A MEAL. 180 capsule 3  ? loratadine (CLARITIN) 10 MG tablet Take 1 tablet (10 mg total) by mouth daily. 90 tablet 3  ? metoprolol  succinate (TOPROL-XL) 25 MG 24 hr tablet TAKE 1 TABLET (25 MG TOTAL) BY MOUTH DAILY. 90 tablet 1  ? PREMARIN vaginal cream APPLY 1 APPLICATORFUL 2 3 TIMES A WEEK AS DIRECTED    ? Probiotic Product (ALIGN) 4 MG CAPS Take 4 mg by mouth as needed.     ? RESTASIS 0.05 % ophthalmic emulsion     ? triamcinolone cream (KENALOG) 0.5 % Apply 1 application topically 3 (three) times daily. 30 g 0  ? ascorbic acid (VITAMIN C) 500 MG tablet Take 500 mg by mouth daily.    ? dicyclomine (BENTYL) 10 MG capsule Take 1 capsule (10 mg total) by mouth 4 (four) times daily -  before meals and at bedtime. As needed for cramps 60 capsule 0  ? gabapentin (NEURONTIN) 100 MG capsule TAKE 1 CAPSULE BY MOUTH EVERYDAY AT BEDTIME 60 capsule 4  ? hydrochlorothiazide (HYDRODIURIL) 25 MG tablet Take 1 tablet (25 mg total) by mouth daily. 90 tablet 0  ? LORazepam (ATIVAN) 0.5 MG tablet Take 0.5-1 tablets (0.25-0.5 mg total) by mouth at bedtime as needed for anxiety. 30 tablet 1  ? losartan (COZAAR) 50 MG tablet TAKE 1 TABLET BY MOUTH EVERY DAY 90 tablet 0  ? ondansetron (ZOFRAN ODT) 4 MG disintegrating tablet Take 1 tablet (4 mg total) by mouth every 8 (eight) hours as needed for nausea or vomiting. 20 tablet  0  ? predniSONE (DELTASONE) 10 MG tablet 3 TABS BY MOUTH PER DAY FOR 3 DAYS,2TABS PER DAY FOR 3 DAYS,1TAB PER DAY FOR 3 DAYS 18 tablet 0  ? pregabalin (LYRICA) 25 MG capsule Take 1 capsule (25 mg total) by mouth at bedtime as needed. 1 po q hs prn 30 capsule 0  ? rosuvastatin (CRESTOR) 10 MG tablet TAKE 1 TABLET BY MOUTH EVERY DAY 90 tablet 3  ? ?No facility-administered medications prior to visit.  ? ? ?ROS ?Review of Systems  ?Constitutional:  Negative for chills, diaphoresis and fatigue.  ?HENT: Negative.    ?Eyes: Negative.  Negative for visual disturbance.  ?Respiratory:  Negative for cough, chest tightness, shortness of breath and wheezing.   ?Cardiovascular:  Negative for chest pain and leg swelling.  ?Gastrointestinal:  Negative for  abdominal pain, constipation, diarrhea, nausea and vomiting.  ?Endocrine: Negative.   ?Genitourinary:  Negative for difficulty urinating.  ?Musculoskeletal: Negative.  Negative for arthralgias.  ?     Nocturnal foot cramps and burning in her feet  ?Skin: Negative.  Negative for color change.  ?Neurological: Negative.  Negative for dizziness, weakness, light-headedness and headaches.  ?Hematological:  Negative for adenopathy. Does not bruise/bleed easily.  ?Psychiatric/Behavioral:  Negative for decreased concentration, dysphoric mood, sleep disturbance and suicidal ideas. The patient is nervous/anxious.   ? ?Objective:  ?BP (!) 160/90   Pulse 74   Temp 98.4 ?F (36.9 ?C) (Oral)   Ht '5\' 4"'$  (1.626 m)   Wt 155 lb (70.3 kg)   SpO2 97%   BMI 26.61 kg/m?  ? ?BP Readings from Last 3 Encounters:  ?06/26/21 (!) 160/90  ?05/14/21 120/80  ?01/31/21 140/80  ? ? ?Wt Readings from Last 3 Encounters:  ?06/26/21 155 lb (70.3 kg)  ?05/14/21 154 lb (69.9 kg)  ?01/31/21 154 lb (69.9 kg)  ? ? ?Physical Exam ?Vitals reviewed.  ?HENT:  ?   Nose: Nose normal.  ?   Mouth/Throat:  ?   Mouth: Mucous membranes are moist.  ?Eyes:  ?   General: No scleral icterus. ?   Conjunctiva/sclera: Conjunctivae normal.  ?Cardiovascular:  ?   Rate and Rhythm: Normal rate and regular rhythm.  ?   Heart sounds: No murmur heard. ?Pulmonary:  ?   Effort: Pulmonary effort is normal.  ?   Breath sounds: No stridor. No wheezing, rhonchi or rales.  ?Abdominal:  ?   General: Abdomen is flat.  ?   Palpations: There is no mass.  ?   Tenderness: There is no abdominal tenderness. There is no guarding.  ?   Hernia: No hernia is present.  ?Musculoskeletal:     ?   General: Normal range of motion.  ?   Cervical back: Neck supple.  ?Lymphadenopathy:  ?   Cervical: No cervical adenopathy.  ?Skin: ?   General: Skin is warm and dry.  ?Neurological:  ?   General: No focal deficit present.  ?   Mental Status: She is alert. Mental status is at baseline.  ?Psychiatric:     ?    Mood and Affect: Mood normal.     ?   Behavior: Behavior normal.  ? ? ?Lab Results  ?Component Value Date  ? WBC 7.5 06/26/2021  ? HGB 13.6 06/26/2021  ? HCT 41.4 06/26/2021  ? PLT 203.0 06/26/2021  ? GLUCOSE 98 06/26/2021  ? CHOL 139 06/26/2021  ? TRIG 101.0 06/26/2021  ? HDL 45.80 06/26/2021  ? LDLDIRECT 160.4 12/19/2008  ? [place] 73  06/26/2021  ? ALT 13 06/26/2021  ? AST 19 06/26/2021  ? NA 139 06/26/2021  ? K 4.0 06/26/2021  ? CL 100 06/26/2021  ? CREATININE 0.91 06/26/2021  ? BUN 20 06/26/2021  ? CO2 29 06/26/2021  ? TSH 1.61 06/26/2021  ? HGBA1C 6.1 06/26/2021  ? ? ?DG Shoulder Right ? ?Result Date: 12/11/2017 ?CLINICAL DATA:  Lateral right shoulder pain since a fall 1 week ago. EXAM: RIGHT SHOULDER - 2+ VIEW COMPARISON:  None. FINDINGS: Diffuse osteopenia. No fracture or dislocation seen. IMPRESSION: No fracture or dislocation. Electronically Signed   By: Claudie Revering M.D.   On: 12/11/2017 11:38  ? ? ?Assessment & Plan:  ? ?Maleny was seen today for annual exam, hypertension and hyperlipidemia. ? ?Diagnoses and all orders for this visit: ? ?Essential hypertension- Her blood pressure is not adequately well controlled.  Will continue the thiazide diuretic and will upgrade to a more potent ARB. ?-     hydrochlorothiazide (HYDRODIURIL) 25 MG tablet; Take 1 tablet (25 mg total) by mouth daily. ?-     Discontinue: losartan (COZAAR) 50 MG tablet; Take 1 tablet (50 mg total) by mouth daily. ?-     Basic metabolic panel; Future ?-     Hepatic function panel; Future ?-     TSH; Future ?-     CBC with Differential/Platelet; Future ?-     CBC with Differential/Platelet ?-     TSH ?-     Hepatic function panel ?-     Basic metabolic panel ?-     olmesartan (BENICAR) 20 MG tablet; Take 1 tablet (20 mg total) by mouth daily. ? ?Stage 3a chronic kidney disease ([place])- Her renal function is stable.  Will try to get better control of her blood pressure. ?-     Basic metabolic panel; Future ?-     Urinalysis, Routine w reflex  microscopic; Future ?-     CBC with Differential/Platelet; Future ?-     CBC with Differential/Platelet ?-     Urinalysis, Routine w reflex microscopic ?-     Basic metabolic panel ?-     olmesartan (BENICAR) 20 MG tab

## 2021-06-27 ENCOUNTER — Telehealth: Payer: Self-pay | Admitting: Orthopedic Surgery

## 2021-06-27 LAB — URINALYSIS, ROUTINE W REFLEX MICROSCOPIC
Bilirubin Urine: NEGATIVE
Hgb urine dipstick: NEGATIVE
Ketones, ur: NEGATIVE
Leukocytes,Ua: NEGATIVE
Nitrite: NEGATIVE
Specific Gravity, Urine: 1.01 (ref 1.000–1.030)
Total Protein, Urine: NEGATIVE
Urine Glucose: NEGATIVE
Urobilinogen, UA: 0.2 (ref 0.0–1.0)
pH: 7 (ref 5.0–8.0)

## 2021-06-27 NOTE — Telephone Encounter (Signed)
IC advised patient not seen patient since 10/2020-would need to call Dr Ronnald Ramp who prescribed medication.  ?

## 2021-06-27 NOTE — Telephone Encounter (Signed)
Patient called asked for a call back concerning a digestive issue she is having taking (Gabapentin) '100mg'$ . Patient said she is experiencing bloating after taking the medication. ?Patient asked if there is a cream she can use instead for her legs. The number to contact patient is [phone]  ?

## 2021-06-28 ENCOUNTER — Telehealth: Payer: Self-pay | Admitting: Internal Medicine

## 2021-06-28 ENCOUNTER — Other Ambulatory Visit: Payer: Self-pay | Admitting: Internal Medicine

## 2021-06-28 MED ORDER — OLMESARTAN MEDOXOMIL 20 MG PO TABS: 20.0000 mg | ORAL_TABLET | Freq: Every day | ORAL | 1 refills | Status: DC

## 2021-06-28 NOTE — Telephone Encounter (Signed)
Pt has been informed that Rx was changed. ?

## 2021-06-28 NOTE — Telephone Encounter (Signed)
I have spoke to the pt and she inquired about her recent lab results from 3/29. I have informed her that per PCP her labs were normal with no concerns.  ? ?Pt is inquiring in regard to alternative medication for BP. She stated nothing was discuss when she brought it up during her visit. Please advise.  ?

## 2021-06-28 NOTE — Telephone Encounter (Signed)
Pt requesting a cb regarding her bp ?

## 2021-07-01 NOTE — Telephone Encounter (Signed)
Pt states  olmesartan (BENICAR) 20 MG tablet is making her feel "heavy headed and some nausea" ? ?Pt states her current bp reading as of today 4-3 is 150/90 ? ?Pt is requesting a cb w/ recommendations  ?

## 2021-07-02 NOTE — Telephone Encounter (Signed)
Called pt, LVM to discuss.  ? ?PT should d/c olmesartan and restart losartan per PCP.  ?

## 2021-07-12 NOTE — Telephone Encounter (Signed)
Pt checking status of return call, informed pt of cma message to d/c olmesartan and restart losartan per PCP ? ?Pt verbalized understanding ?

## 2021-07-15 ENCOUNTER — Other Ambulatory Visit: Payer: Self-pay | Admitting: Internal Medicine

## 2021-07-15 DIAGNOSIS — I10 Essential (primary) hypertension: Secondary | ICD-10-CM

## 2021-07-15 MED ORDER — LOSARTAN POTASSIUM 100 MG PO TABS: 100.0000 mg | ORAL_TABLET | Freq: Every day | ORAL | 1 refills | Status: DC

## 2021-07-15 NOTE — Telephone Encounter (Signed)
Pt states losartan is not helping reduce bp ? ?Pt states as of 4-17 bp reading is 160/95 ? ?Pt inquiring if dosage can be increased to 100 mg daily ? ?Please advise ?

## 2021-07-19 ENCOUNTER — Encounter: Payer: Self-pay | Admitting: Cardiovascular Disease

## 2021-07-29 ENCOUNTER — Ambulatory Visit (INDEPENDENT_AMBULATORY_CARE_PROVIDER_SITE_OTHER): Payer: PPO

## 2021-07-29 VITALS — Ht 64.0 in | Wt 155.0 lb

## 2021-07-29 DIAGNOSIS — Z0000 Encounter for general adult medical examination without abnormal findings: Secondary | ICD-10-CM | POA: Diagnosis not present

## 2021-07-29 NOTE — Progress Notes (Signed)
? ?Subjective:  ? Krystal [name] is a 88 y.o. female who presents for Medicare Annual (Subsequent) preventive examination. ? ?Review of Systems    ?Virtual Visit via Telephone Note ? ?I connected with  [name] on 07/29/21 at 10:15 AM EDT by telephone and verified that I am speaking with the correct person using two identifiers. ? ?Location: ?Patient: Home ?Provider: Office ?Persons participating in the virtual visit: patient/Nurse Health Advisor ?  ?I discussed the limitations, risks, security and privacy concerns of performing an evaluation and management service by telephone and the availability of in person appointments. The patient expressed understanding and agreed to proceed. ? ?Interactive audio and video telecommunications were attempted between this nurse and patient, however failed, due to patient having technical difficulties OR patient did not have access to video capability.  We continued and completed visit with audio only. ? ?Some vital signs may be absent or patient reported.  ? ?Criselda Peaches, LPN  ?Cardiac Risk Factors include: advanced age (>9mn, >>27women);hypertension ? ?   ?Objective:  ?  ?Today's Vitals  ? 07/29/21 1106  ?Weight: 155 lb (70.3 kg)  ?Height: '5\' 4"'$  (1.626 m)  ? ?Body mass index is 26.61 kg/m?. ? ? ?  07/29/2021  ? 11:14 AM 09/04/2015  ? 11:46 PM 07/26/2015  ?  9:42 AM 09/14/2014  ?  9:11 AM 06/09/2014  ? 12:44 PM  ?Advanced Directives  ?Does Patient Have a Medical Advance Directive? Yes No Yes Yes No  ?Type of [place] will      ?Does patient want to make changes to medical advance directive? No - Patient declined   No - Patient declined   ?Copy of [place] Chart? No - copy requested   Yes   ?Would patient like information on creating a medical advance directive?  No - patient declined information   No - patient declined information  ? ? ?Current Medications (verified) ?Outpatient Encounter Medications as of  07/29/2021  ?Medication Sig  ? acetaminophen (TYLENOL) 500 MG tablet Take 500 mg by mouth every 4 (four) hours as needed for mild pain or headache.  ? albuterol (VENTOLIN HFA) 108 (90 Base) MCG/ACT inhaler TAKE 2 PUFFS BY MOUTH EVERY 6 HOURS AS NEEDED FOR WHEEZE OR SHORTNESS OF BREATH  ? aspirin EC 81 MG tablet Take 81 mg by mouth daily. Swallow whole.  ? azelastine (ASTELIN) 0.1 % nasal spray Place 2 sprays into both nostrils 2 (two) times daily. Use in each nostril as directed  ? calcium carbonate (OS-CAL) 600 MG TABS tablet Take 600 mg by mouth 2 (two) times daily with a meal.  ? cholecalciferol (VITAMIN D) 1000 UNITS tablet Take 2,000 Units by mouth daily.   ? docusate sodium (COLACE) 100 MG capsule Take 100 mg by mouth every other day.  ? fluticasone (FLONASE) 50 MCG/ACT nasal spray SPRAY 1 SPRAY INTO BOTH NOSTRILS DAILY.  ? gabapentin (NEURONTIN) 100 MG capsule Take 1 capsule (100 mg total) by mouth at bedtime. TAKE 1 CAPSULE BY MOUTH EVERYDAY AT BEDTIME  ? hydrochlorothiazide (HYDRODIURIL) 25 MG tablet Take 1 tablet (25 mg total) by mouth daily.  ? lansoprazole (PREVACID) 15 MG capsule TAKE 1 CAPSULE (15 MG TOTAL) BY MOUTH 2 (TWO) TIMES DAILY BEFORE A MEAL.  ? loratadine (CLARITIN) 10 MG tablet Take 1 tablet (10 mg total) by mouth daily.  ? LORazepam (ATIVAN) 0.5 MG tablet Take 1 tablet (0.5 mg total) by mouth at  bedtime as needed for anxiety.  ? losartan (COZAAR) 100 MG tablet Take 1 tablet (100 mg total) by mouth daily.  ? metoprolol succinate (TOPROL-XL) 25 MG 24 hr tablet TAKE 1 TABLET (25 MG TOTAL) BY MOUTH DAILY.  ? PREMARIN vaginal cream APPLY 1 APPLICATORFUL 2 3 TIMES A WEEK AS DIRECTED  ? Probiotic Product (ALIGN) 4 MG CAPS Take 4 mg by mouth as needed.   ? RESTASIS 0.05 % ophthalmic emulsion   ? rosuvastatin (CRESTOR) 10 MG tablet Take 1 tablet (10 mg total) by mouth daily.  ? triamcinolone cream (KENALOG) 0.5 % Apply 1 application topically 3 (three) times daily.  ? ?No facility-administered  encounter medications on file as of 07/29/2021.  ? ? ?Allergies (verified) ?Atorvastatin, Simvastatin, Alendronate sodium, Cefdinir, Doxycycline, Esomeprazole magnesium, Levofloxacin, Omeprazole, Other, Penicillins, Amlodipine, and Shellfish-derived products  ? ?History: ?Past Medical History:  ?Diagnosis Date  ? Adenomatous colon polyp   ? Allergy   ? seasonal  ? Anemia   ? pt. denies  ? Anxiety   ? CAD (coronary artery disease)   ? Cataract   ? cataracts removed left eye.  ? Chronic lower back pain   ? Diverticulosis   ? GERD (gastroesophageal reflux disease)   ? Headache(784.0)   ? Hiatal hernia   ? Hyperlipidemia   ? Hypertension   ? IBS (irritable bowel syndrome) 10/13/2011  ? Osteoarthritis   ? Osteoporosis   ? Renal cysts, acquired, bilateral   ? SBO (small bowel obstruction) ([place]) 09/05/2015  ? Venous insufficiency   ? ?Past Surgical History:  ?Procedure Laterality Date  ? ABDOMINAL HYSTERECTOMY    ? BLADDER REPAIR    ? CARDIAC CATHETERIZATION  01/12/2002  ? 90% stenosis of the left circumflex, stented with a 3x87m Cypher stent, resulting in reduction of 90% to 10%   ? CARDIOVASCULAR STRESS TEST  03/22/2007  ? Mild inferolateral thinning toward the apex without significant ischemia. Nondiagnostic electrocardiogram.  ? CATARACT EXTRACTION    ? bilateral  ? COLONOSCOPY  08/17/2008  ? adenomatous polyp, diverticulosis, external hemorrhoids  ? COLONOSCOPY W/ BIOPSIES    ? multiple   ? LEFT LOWER EXTREMITY VENOUS DOPPLER  06/18/2011  ? No evidence of left lower extremity DVT  ? TRANSTHORACIC ECHOCARDIOGRAM  11/18/2012  ? EF 426-41% LV systolic mild-moderately reduced, mild-moderate mitral valve regurg, mild-moderate tricuspid valve regurg. NSR-LBBB with occas PVCs  ? UPPER GASTROINTESTINAL ENDOSCOPY  07/03/2010  ? hiatal hernia  ? VARICOSE VEIN SURGERY    ? ?Family History  ?Problem Relation Age of Onset  ? Heart disease Father   ? Stroke Sister   ? Colon cancer Neg Hx   ? ?Social History  ? ?Socioeconomic History  ?  Marital status: Married  ?  Spouse name: WGwyndolyn Saxon ? Number of children: Not on file  ? Years of education: Not on file  ? Highest education level: Not on file  ?Occupational History  ? Occupation: retired  ?Tobacco Use  ? Smoking status: Never  ? Smokeless tobacco: Never  ?Vaping Use  ? Vaping Use: Never used  ?Substance and Sexual Activity  ? Alcohol use: No  ?  Alcohol/week: 0.0 standard drinks  ? Drug use: No  ? Sexual activity: Not on file  ?Other Topics Concern  ? Not on file  ?Social History Narrative  ? Married to BButlerwho has congestive heart failure problems  ? She is retired never smoker rare alcohol  ? ?Social Determinants of Health  ? ?FEmergency planning/management officer  Strain: Low Risk   ? Difficulty of Paying Living Expenses: Not hard at all  ?Food Insecurity: No Food Insecurity  ? Worried About Charity fundraiser in the Last Year: Never true  ? Ran Out of Food in the Last Year: Never true  ?Transportation Needs: No Transportation Needs  ? Lack of Transportation (Medical): No  ? Lack of Transportation (Non-Medical): No  ?Physical Activity: Sufficiently Active  ? Days of Exercise per Week: 5 days  ? Minutes of Exercise per Session: 30 min  ?Stress: No Stress Concern Present  ? Feeling of Stress : Not at all  ?Social Connections: Moderately Isolated  ? Frequency of Communication with Friends and Family: More than three times a week  ? Frequency of Social Gatherings with Friends and Family: More than three times a week  ? Attends Religious Services: Never  ? Active Member of Clubs or Organizations: No  ? Attends Archivist Meetings: Never  ? Marital Status: Married  ? ? ?Clinical Intake: ? ?  ?Diabetic?  No ? ?Interpreter Needed?: No ? ?Activities of Daily Living ? ?  07/29/2021  ? 11:13 AM  ?In your present state of health, do you have any difficulty performing the following activities:  ?Hearing? 0  ?Vision? 0  ?Difficulty concentrating or making decisions? 0  ?Walking or climbing stairs? 0  ?Dressing or  bathing? 0  ?Doing errands, shopping? 0  ?Preparing Food and eating ? N  ?Using the Toilet? N  ?In the past six months, have you accidently leaked urine? N  ?Do you have problems with loss of bowel control? N  ?[place]

## 2021-07-29 NOTE — Patient Instructions (Addendum)
?Ms. Diguglielmo , ?Thank you for taking time to come for your Medicare Wellness Visit. I appreciate your ongoing commitment to your health goals. Please review the following plan we discussed and let me know if I can assist you in the future.  ? ?These are the goals we discussed: ? Goals   ? ?   Manage My Medicine   ?   Timeframe:  Long-Range Goal ?Priority:  Medium ?Start Date:    08/06/20                         ?Expected End Date:   08/06/21                   ? ?Follow Up Date May 2023 ?  ?- call for medicine refill 2 or 3 days before it runs out ?- call if I am sick and can't take my medicine ?- keep a list of all the medicines I take; vitamins and herbals too ?- use a pillbox to sort medicine  ?  ?Why is this important?   ?These steps will help you keep on track with your medicines. ?  ?Notes:  ?  ?   patient (pt-stated)   ?   Patient will start sewing more; makes bed-skirts and pillows. ?  ? ?  ?  ?This is a list of the screening recommended for you and due dates:  ?Health Maintenance  ?Topic Date Due  ? COVID-19 Vaccine (4 - Booster for Pfizer series) 08/14/2021*  ? Pneumonia Vaccine (1 - PCV) 07/30/2022*  ? Flu Shot  10/29/2021  ? Tetanus Vaccine  10/02/2025  ? DEXA scan (bone density measurement)  Completed  ? Zoster (Shingles) Vaccine  Completed  ? HPV Vaccine  Aged Out  ?*Topic was postponed. The date shown is not the original due date.  ? ? ?Advanced directives: Yes  ? ?Conditions/risks identified: None ? ?Next appointment: Follow up in one year for your annual wellness visit  ? ? ?Preventive Care 86 Years and Older, Female ?Preventive care refers to lifestyle choices and visits with your health care provider that can promote health and wellness. ?What does preventive care include? ?A yearly physical exam. This is also called an annual well check. ?Dental exams once or twice a year. ?Routine eye exams. Ask your health care provider how often you should have your eyes checked. ?Personal lifestyle choices,  including: ?Daily care of your teeth and gums. ?Regular physical activity. ?Eating a healthy diet. ?Avoiding tobacco and drug use. ?Limiting alcohol use. ?Practicing safe sex. ?Taking low-dose aspirin every day. ?Taking vitamin and mineral supplements as recommended by your health care provider. ?What happens during an annual well check? ?The services and screenings done by your health care provider during your annual well check will depend on your age, overall health, lifestyle risk factors, and family history of disease. ?Counseling  ?Your health care provider may ask you questions about your: ?Alcohol use. ?Tobacco use. ?Drug use. ?Emotional well-being. ?Home and relationship well-being. ?Sexual activity. ?Eating habits. ?History of falls. ?Memory and ability to understand (cognition). ?Work and work Statistician. ?Reproductive health. ?Screening  ?You may have the following tests or measurements: ?Height, weight, and BMI. ?Blood pressure. ?Lipid and cholesterol levels. These may be checked every 5 years, or more frequently if you are over 23 years old. ?Skin check. ?Lung cancer screening. You may have this screening every year starting at age 62 if you have a 30-pack-year history of  smoking and currently smoke or have quit within the past 15 years. ?Fecal occult blood test (FOBT) of the stool. You may have this test every year starting at age 3. ?Flexible sigmoidoscopy or colonoscopy. You may have a sigmoidoscopy every 5 years or a colonoscopy every 10 years starting at age 39. ?Hepatitis C blood test. ?Hepatitis B blood test. ?Sexually transmitted disease (STD) testing. ?Diabetes screening. This is done by checking your blood sugar (glucose) after you have not eaten for a while (fasting). You may have this done every 1-3 years. ?Bone density scan. This is done to screen for osteoporosis. You may have this done starting at age 34. ?Mammogram. This may be done every 1-2 years. Talk to your health care provider  about how often you should have regular mammograms. ?Talk with your health care provider about your test results, treatment options, and if necessary, the need for more tests. ?Vaccines  ?Your health care provider may recommend certain vaccines, such as: ?Influenza vaccine. This is recommended every year. ?Tetanus, diphtheria, and acellular pertussis (Tdap, Td) vaccine. You may need a Td booster every 10 years. ?Zoster vaccine. You may need this after age 46. ?Pneumococcal 13-valent conjugate (PCV13) vaccine. One dose is recommended after age 39. ?Pneumococcal polysaccharide (PPSV23) vaccine. One dose is recommended after age 75. ?Talk to your health care provider about which screenings and vaccines you need and how often you need them. ?This information is not intended to replace advice given to you by your health care provider. Make sure you discuss any questions you have with your health care provider. ?Document Released: 04/13/2015 Document Revised: 12/05/2015 Document Reviewed: 01/16/2015 ?Elsevier Interactive Patient Education ? 2017 [place]. ? ?Fall Prevention in the Home ?Falls can cause injuries. They can happen to people of all ages. There are many things you can do to make your home safe and to help prevent falls. ?What can I do on the outside of my home? ?Regularly fix the edges of walkways and driveways and fix any cracks. ?Remove anything that might make you trip as you walk through a door, such as a raised step or threshold. ?Trim any bushes or trees on the path to your home. ?Use bright outdoor lighting. ?Clear any walking paths of anything that might make someone trip, such as rocks or tools. ?Regularly check to see if handrails are loose or broken. Make sure that both sides of any steps have handrails. ?Any raised decks and porches should have guardrails on the edges. ?Have any leaves, snow, or ice cleared regularly. ?Use sand or salt on walking paths during winter. ?Clean up any spills in  your garage right away. This includes oil or grease spills. ?What can I do in the bathroom? ?Use night lights. ?Install grab bars by the toilet and in the tub and shower. Do not use towel bars as grab bars. ?Use non-skid mats or decals in the tub or shower. ?If you need to sit down in the shower, use a plastic, non-slip stool. ?Keep the floor dry. Clean up any water that spills on the floor as soon as it happens. ?Remove soap buildup in the tub or shower regularly. ?Attach bath mats securely with double-sided non-slip rug tape. ?Do not have throw rugs and other things on the floor that can make you trip. ?What can I do in the bedroom? ?Use night lights. ?Make sure that you have a light by your bed that is easy to reach. ?Do not use any sheets or blankets that  are too big for your bed. They should not hang down onto the floor. ?Have a firm chair that has side arms. You can use this for support while you get dressed. ?Do not have throw rugs and other things on the floor that can make you trip. ?What can I do in the kitchen? ?Clean up any spills right away. ?Avoid walking on wet floors. ?Keep items that you use a lot in easy-to-reach places. ?If you need to reach something above you, use a strong step stool that has a grab bar. ?Keep electrical cords out of the way. ?Do not use floor polish or wax that makes floors slippery. If you must use wax, use non-skid floor wax. ?Do not have throw rugs and other things on the floor that can make you trip. ?What can I do with my stairs? ?Do not leave any items on the stairs. ?Make sure that there are handrails on both sides of the stairs and use them. Fix handrails that are broken or loose. Make sure that handrails are as long as the stairways. ?Check any carpeting to make sure that it is firmly attached to the stairs. Fix any carpet that is loose or worn. ?Avoid having throw rugs at the top or bottom of the stairs. If you do have throw rugs, attach them to the floor with carpet  tape. ?Make sure that you have a light switch at the top of the stairs and the bottom of the stairs. If you do not have them, ask someone to add them for you. ?What else can I do to help prevent falls? ?Wear sho

## 2021-07-30 ENCOUNTER — Other Ambulatory Visit: Payer: Self-pay | Admitting: Internal Medicine

## 2021-07-30 ENCOUNTER — Encounter: Payer: Self-pay | Admitting: Internal Medicine

## 2021-07-30 DIAGNOSIS — J301 Allergic rhinitis due to pollen: Secondary | ICD-10-CM

## 2021-07-30 MED ORDER — AZELASTINE HCL 0.1 % NA SOLN: 2.0000 | Freq: Two times a day (BID) | NASAL | 1 refills | Status: DC

## 2021-07-30 MED ORDER — FLUTICASONE PROPIONATE 50 MCG/ACT NA SUSP: NASAL | 1 refills | Status: DC

## 2021-08-01 ENCOUNTER — Ambulatory Visit (INDEPENDENT_AMBULATORY_CARE_PROVIDER_SITE_OTHER): Payer: PPO

## 2021-08-01 ENCOUNTER — Other Ambulatory Visit: Payer: Self-pay | Admitting: Internal Medicine

## 2021-08-01 DIAGNOSIS — I10 Essential (primary) hypertension: Secondary | ICD-10-CM

## 2021-08-01 DIAGNOSIS — J301 Allergic rhinitis due to pollen: Secondary | ICD-10-CM

## 2021-08-01 DIAGNOSIS — R7303 Prediabetes: Secondary | ICD-10-CM

## 2021-08-01 DIAGNOSIS — G2581 Restless legs syndrome: Secondary | ICD-10-CM

## 2021-08-01 DIAGNOSIS — E785 Hyperlipidemia, unspecified: Secondary | ICD-10-CM

## 2021-08-01 DIAGNOSIS — N1831 Chronic kidney disease, stage 3a: Secondary | ICD-10-CM

## 2021-08-01 DIAGNOSIS — G8929 Other chronic pain: Secondary | ICD-10-CM

## 2021-08-01 MED ORDER — DULOXETINE HCL 20 MG PO CPEP: 20.0000 mg | ORAL_CAPSULE | Freq: Every day | ORAL | 0 refills | Status: DC

## 2021-08-01 NOTE — Patient Instructions (Signed)
Visit Information ? ?Following are the goals we discussed today:  ? ?Manage My Medicine  ? ?Timeframe:  Long-Range Goal ?Priority:  Medium ?Start Date:    08/06/20                         ?Expected End Date:   08/07/22                  ? ?Follow Up Date 01/2022 ?  ?- call for medicine refill 2 or 3 days before it runs out ?- call if I am sick and can't take my medicine ?- keep a list of all the medicines I take; vitamins and herbals too ?- use a pillbox to sort medicine  ?  ?Why is this important?   ?These steps will help you keep on track with your medicines. ? ?Plan: Telephone follow up appointment with care management team member scheduled for:  6 months ?The patient has been provided with contact information for the care management team and has been advised to call with any health related questions or concerns.  ? ?Tomasa Blase, PharmD ?Clinical Pharmacist, [facility]  ? ?Please call the care guide team at [phone] if you need to cancel or reschedule your appointment.  ? ?Patient verbalizes understanding of instructions and care plan provided today and agrees to view in [facility]. Active MyChart status confirmed with patient.   ? ?

## 2021-08-01 NOTE — Progress Notes (Signed)
?Chronic Care Management ?Pharmacy Note ? ?08/01/2021 ?Name:  Krystal [name] MRN:  [id number] DOB:  07-08-1929 ? ?Summary: ?-Patient reports to compliance with current medications, trialed olmesartan but finds BP has been better with losartan 138m - still has been elevated at times, particularly in the AM ?-BP averaging 150/80-90 in the AM, in the afternoon and for the rest of the day averaging 130/70's  ?-Patient reports biggest issue she is dealing with right now is her neuropathy in her feet - unable to take gabapentin as it causes bloating and GI upset - effective when she had taken - had similar issue with lyrica ? ?Recommendations/Changes made from today's visit: ?-Recommended for patient to split losartan dosing to 542mBID, patient to continue to monitor BP and to reach out should she BP remain uncontrolled  ?-Will reach out to PCP about possible trial of duloxetine for her neuropathy  ?-F/u in 6 months  ? ?Subjective: ?Krystal [name] an [age].o. year old female who is a primary patient of JoJanith LimaMD.  The CCM team was consulted for assistance with disease management and care coordination needs.   ? ?Engaged with patient by telephone for follow up visit in response to provider referral for pharmacy case management and/or care coordination services.  ? ?Consent to Services:  ?The patient was given information about Chronic Care Management services, agreed to services, and gave verbal consent prior to initiation of services.  Please see initial visit note for detailed documentation.  ? ?Patient Care Team: ?JoJanith LimaMD as PCP - General (Internal Medicine) ?BeLorretta HarpMD as PCP - Cardiology (Cardiology) ?Foltanski, LiCleaster CorinRPEye Surgicenter LLCs Pharmacist (Pharmacist) ?SzTomasa BlaseRPOak Brook Surgical Centre IncPharmacist) ? ?Recent office visits: ?06/26/2021 - Dr. JoRonnald Ramp stopped losartan, started olmesartan 2069maily  ?04/03/2021 - Dr. JohJenny Reichmann VV  - allergic rhinitis - husband COVID positive - pt COVID negative -  prednisone rx'd  ? ?Recent consult visits: ?05/14/2021 - Dr. JacGlennon MacSports Medicine [facility] - APAP and voltaren gel prn pain - f/u in 3 weeks  ?03/26/2021 - Dr. WagJacqualyn PoseyPodiatry - right foot pain - f/u in 4 weeks  ? ?Hospital visits: ?None in previous 6 months ? ?Objective: ? ?Lab Results  ?Component Value Date  ? CREATININE 0.91 06/26/2021  ? BUN 20 06/26/2021  ? GFR 55.97 (L) 06/26/2021  ? GFRNONAA 72 04/28/2017  ? GFRAA 83 04/28/2017  ? NA 139 06/26/2021  ? K 4.0 06/26/2021  ? CALCIUM 9.5 06/26/2021  ? CO2 29 06/26/2021  ? GLUCOSE 98 06/26/2021  ? ? ?Lab Results  ?Component Value Date/Time  ? HGBA1C 6.1 06/26/2021 03:29 PM  ? HGBA1C 6.1 05/08/2020 09:50 AM  ? GFR 55.97 (L) 06/26/2021 03:29 PM  ? GFR 50.38 (L) 05/08/2020 09:50 AM  ?  ?Last diabetic Eye exam: No results found for: HMDIABEYEEXA  ?Last diabetic Foot exam: No results found for: HMDIABFOOTEX  ? ?Lab Results  ?Component Value Date  ? CHOL 139 06/26/2021  ? HDL 45.80 06/26/2021  ? LDL[place] 06/26/2021  ? LDLDIRECT 160.4 12/19/2008  ? TRIG 101.0 06/26/2021  ? CHOLHDL 3 06/26/2021  ? ? ? ?  Latest Ref Rng & Units 06/26/2021  ?  3:29 PM 05/08/2020  ?  9:50 AM 02/11/2019  ?  1:54 PM  ?Hepatic Function  ?Total Protein 6.0 - 8.3 g/dL 6.6   6.4   6.8    ?Albumin 3.5 - 5.2  g/dL 4.1   3.9   4.2    ?AST 0 - 37 U/L _0 ?ALT 0 - 35 U/L _1 ?Alk Phosphatase 39 - 117 U/L 92   81   92    ?Total Bilirubin 0.2 - 1.2 mg/dL 0.4   0.5   0.4    ?Bilirubin, Direct 0.0 - 0.3 mg/dL 0.1   0.1     ? ? ?Lab Results  ?Component Value Date/Time  ? TSH 1.61 06/26/2021 03:29 PM  ? TSH 1.56 05/08/2020 09:50 AM  ? FREET4 1.42 11/16/2012 08:10 AM  ? ? ? ?  Latest Ref Rng & Units 06/26/2021  ?  3:29 PM 05/08/2020  ?  9:50 AM 02/11/2019  ?  1:54 PM  ?CBC  ?WBC 4.0 - 10.5 K/uL 7.5   9.2   9.7    ?Hemoglobin 12.0 - 15.0 g/dL 13.6   13.9   14.6    ?Hematocrit 36.0 - 46.0 % 41.4   42.0   43.7    ?Platelets 150.0 - 400.0 K/uL 203.0   231.0   228.0     ? ? ?Lab Results  ?Component Value Date/Time  ? VD25OH 55.36 10/19/2017 02:40 PM  ? VD25OH 41 04/13/2012 11:46 AM  ? VD25OH 45 02/06/2010 08:06 PM  ? ? ?Clinical ASCVD: Yes  ?The ASCVD Risk score (Arnett DK, et al., 2019) failed to calculate for the following reasons: ?  The 2019 ASCVD risk score is only valid for ages 36 to 58   ? ? ?  07/29/2021  ? 11:10 AM 10/24/2020  ?  2:43 PM 05/08/2020  ?  9:27 AM  ?Depression screen PHQ 2/9  ?Decreased Interest 0 0 0  ?Down, Depressed, Hopeless 0 0 0  ?PHQ - 2 Score 0 0 0  ?  ? ?Social History  ? ?Tobacco Use  ?Smoking Status Never  ?Smokeless Tobacco Never  ? ?BP Readings from Last 3 Encounters:  ?06/26/21 (!) 160/90  ?05/14/21 120/80  ?01/31/21 140/80  ? ?Pulse Readings from Last 3 Encounters:  ?06/26/21 74  ?05/14/21 73  ?01/31/21 62  ? ?Wt Readings from Last 3 Encounters:  ?07/29/21 155 lb (70.3 kg)  ?06/26/21 155 lb (70.3 kg)  ?05/14/21 154 lb (69.9 kg)  ? ?BMI Readings from Last 3 Encounters:  ?07/29/21 26.61 kg/m?  ?06/26/21 26.61 kg/m?  ?05/14/21 26.43 kg/m?  ? ? ?Assessment/Interventions: Review of patient past medical history, allergies, medications, health status, including review of consultants reports, laboratory and other test data, was performed as part of comprehensive evaluation and provision of chronic care management services.  ? ?SDOH:  (Social Determinants of Health) assessments and interventions performed: Yes ? ? ?SDOH Screenings  ? ?Alcohol Screen: Low Risk   ? Last Alcohol Screening Score (AUDIT): 0  ?Depression (PHQ2-9): Low Risk   ? PHQ-2 Score: 0  ?Financial Resource Strain: Low Risk   ? Difficulty of Paying Living Expenses: Not hard at all  ?Food Insecurity: No Food Insecurity  ? Worried About Charity fundraiser in the Last Year: Never true  ? Ran Out of Food in the Last Year: Never true  ?Housing: Low Risk   ? Last Housing Risk Score: 0  ?Physical Activity: Sufficiently Active  ? Days of Exercise per Week: 5 days  ? Minutes of Exercise per Session:  30 min  ?Social Connections: Moderately Isolated  ? Frequency of  Communication with Friends and Family: More than three times a week  ? Frequency of Social Gatherings with Friends and Family: More than three times a week  ? Attends Religious Services: Never  ? Active Member of Clubs or Organizations: No  ? Attends Club or Organization Meetings: Never  ? Marital Status: Married  ?Stress: No Stress Concern Present  ? Feeling of Stress : Not at all  ?Tobacco Use: Low Risk   ? Smoking Tobacco Use: Never  ? Smokeless Tobacco Use: Never  ? Passive Exposure: Not on file  ?Transportation Needs: No Transportation Needs  ? Lack of Transportation (Medical): No  ? Lack of Transportation (Non-Medical): No  ? ? ?CCM Care Plan ? ?Allergies  ?Allergen Reactions  ? Atorvastatin Other (See Comments)  ?  Muscle aches  ? Simvastatin Other (See Comments)  ?  Myalgias ?  ? Alendronate Sodium   ?  REACTION: pt states INTOL to Fosamax w/ esophagitis  ? Cefdinir   ?  REACTION: diarrhea  ? Doxycycline   ?  diarrhea  ? Esomeprazole Magnesium   ?  REACTION: pt states INTOL to Nexium  ? Levofloxacin   ?  REACTION: diarrhea  ? Omeprazole   ?  REACTION: pt states INTOL to Prilosec  ? Other   ?  Pneumonia vaccine---felt tired and achy and sick x 6 months to get over this.  ? Penicillins Hives  ? Amlodipine Other (See Comments)  ?  Ankle edema  ? Shellfish-Derived Products Hives, Nausea Only and Rash  ? ? ?Medications Reviewed Today   ? ? Reviewed by Szabat, Daniel C, RPH (Pharmacist) on 08/01/21 at 1543  Med List Status: <None>  ? ?Medication Order Taking? Sig Documenting Provider Last Dose Status Informant  ?acetaminophen (TYLENOL) 500 MG tablet [id number] Yes Take 500 mg by mouth every 4 (four) hours as needed for mild pain or headache. [provider] Taking Active Self  ?albuterol (VENTOLIN HFA) 108 (90 Base) MCG/ACT inhaler [id number] Yes TAKE 2 PUFFS BY MOUTH EVERY 6 HOURS AS NEEDED FOR WHEEZE OR SHORTNESS OF BREATH Jones, Thomas L,  MD Taking Active   ?aspirin EC 81 MG tablet [id number] Yes Take 81 mg by mouth daily. Swallow whole. [provider] Taking Active   ?calcium carbonate (OS-CAL) 600 MG TABS tablet [id number] Yes Take 600

## 2021-08-05 ENCOUNTER — Other Ambulatory Visit: Payer: Self-pay | Admitting: Internal Medicine

## 2021-08-05 DIAGNOSIS — J301 Allergic rhinitis due to pollen: Secondary | ICD-10-CM

## 2021-08-13 ENCOUNTER — Ambulatory Visit (INDEPENDENT_AMBULATORY_CARE_PROVIDER_SITE_OTHER): Payer: PPO | Admitting: Internal Medicine

## 2021-08-13 ENCOUNTER — Encounter: Payer: Self-pay | Admitting: Internal Medicine

## 2021-08-13 VITALS — BP 168/98 | HR 70 | Temp 98.3°F | Resp 16 | Ht 64.0 in | Wt 156.0 lb

## 2021-08-13 DIAGNOSIS — M792 Neuralgia and neuritis, unspecified: Secondary | ICD-10-CM

## 2021-08-13 DIAGNOSIS — G8929 Other chronic pain: Secondary | ICD-10-CM | POA: Diagnosis not present

## 2021-08-13 DIAGNOSIS — I420 Dilated cardiomyopathy: Secondary | ICD-10-CM

## 2021-08-13 DIAGNOSIS — I10 Essential (primary) hypertension: Secondary | ICD-10-CM | POA: Diagnosis not present

## 2021-08-13 DIAGNOSIS — G2581 Restless legs syndrome: Secondary | ICD-10-CM | POA: Diagnosis not present

## 2021-08-13 MED ORDER — PREGABALIN 25 MG PO CAPS: 25.0000 mg | ORAL_CAPSULE | Freq: Every day | ORAL | 1 refills | Status: DC

## 2021-08-13 MED ORDER — HYDRALAZINE HCL 25 MG PO TABS: 25.0000 mg | ORAL_TABLET | Freq: Three times a day (TID) | ORAL | 1 refills | Status: DC

## 2021-08-13 NOTE — Progress Notes (Signed)
? ?Subjective:  ?Patient ID: Krystal [name], female    DOB: 11-25-1929  Age: 88 y.o. MRN: [id number] ? ?CC: Hypertension ? ? ?HPI ?Krystal [name] presents for f/up - ? ?She is concerned that her blood pressure is not well controlled despite taking 3 agents and she has had a few headaches recently.  She also complains of burning neuropathy pain in her feet at night.  She is not taking gabapentin because it caused stomach upset.  She walks several miles a day and does not experience chest pain, shortness of breath, diaphoresis, or edema. ? ?Outpatient Medications Prior to Visit  ?Medication Sig Dispense Refill  ? acetaminophen (TYLENOL) 500 MG tablet Take 500 mg by mouth every 4 (four) hours as needed for mild pain or headache.    ? albuterol (VENTOLIN HFA) 108 (90 Base) MCG/ACT inhaler TAKE 2 PUFFS BY MOUTH EVERY 6 HOURS AS NEEDED FOR WHEEZE OR SHORTNESS OF BREATH 8.5 each 1  ? aspirin EC 81 MG tablet Take 81 mg by mouth daily. Swallow whole.    ? calcium carbonate (OS-CAL) 600 MG TABS tablet Take 600 mg by mouth 2 (two) times daily with a meal.    ? cholecalciferol (VITAMIN D) 1000 UNITS tablet Take 2,000 Units by mouth daily.     ? docusate sodium (COLACE) 100 MG capsule Take 100 mg by mouth daily as needed.    ? DULoxetine (CYMBALTA) 20 MG capsule Take 1 capsule (20 mg total) by mouth daily. 90 capsule 0  ? fluticasone (FLONASE) 50 MCG/ACT nasal spray SPRAY 1 SPRAY INTO BOTH NOSTRILS DAILY. 48 mL 1  ? hydrochlorothiazide (HYDRODIURIL) 25 MG tablet Take 1 tablet (25 mg total) by mouth daily. 90 tablet 0  ? lansoprazole (PREVACID) 15 MG capsule TAKE 1 CAPSULE (15 MG TOTAL) BY MOUTH 2 (TWO) TIMES DAILY BEFORE A MEAL. (Patient taking differently: Take 15 mg by mouth daily.) 180 capsule 3  ? loratadine (CLARITIN) 10 MG tablet TAKE 1 TABLET BY MOUTH EVERY DAY 90 tablet 3  ? LORazepam (ATIVAN) 0.5 MG tablet Take 1 tablet (0.5 mg total) by mouth at bedtime as needed for anxiety. 90 tablet 1  ? losartan (COZAAR) 100 MG  tablet Take 1 tablet (100 mg total) by mouth daily. 90 tablet 1  ? metoprolol succinate (TOPROL-XL) 25 MG 24 hr tablet TAKE 1 TABLET (25 MG TOTAL) BY MOUTH DAILY. 90 tablet 1  ? PREMARIN vaginal cream APPLY 1 APPLICATORFUL 2 3 TIMES A WEEK AS DIRECTED    ? Probiotic Product (ALIGN) 4 MG CAPS Take 4 mg by mouth as needed.     ? RESTASIS 0.05 % ophthalmic emulsion     ? rosuvastatin (CRESTOR) 10 MG tablet Take 1 tablet (10 mg total) by mouth daily. 90 tablet 1  ? gabapentin (NEURONTIN) 100 MG capsule Take 1 capsule (100 mg total) by mouth at bedtime. TAKE 1 CAPSULE BY MOUTH EVERYDAY AT BEDTIME (Patient taking differently: Take 100 mg by mouth at bedtime. TAKE 1 CAPSULE BY MOUTH EVERYDAY AT BEDTIME prn) 90 capsule 1  ? ?No facility-administered medications prior to visit.  ? ? ?ROS ?Review of Systems  ?Constitutional: Negative.  Negative for diaphoresis and fatigue.  ?HENT: Negative.    ?Eyes:  Negative for visual disturbance.  ?Respiratory:  Negative for cough, chest tightness, shortness of breath and wheezing.   ?Cardiovascular:  Negative for chest pain, palpitations and leg swelling.  ?Gastrointestinal:  Negative for abdominal pain, constipation, diarrhea, nausea and vomiting.  ?Endocrine: Negative.   ?  Genitourinary: Negative.  Negative for difficulty urinating.  ?Musculoskeletal: Negative.   ?Skin: Negative.   ?Neurological:  Positive for headaches. Negative for dizziness, speech difficulty, weakness and numbness.  ?Hematological:  Negative for adenopathy. Does not bruise/bleed easily.  ?Psychiatric/Behavioral: Negative.    ? ?Objective:  ?BP (!) 168/98 (BP Location: Right Arm, Patient Position: Sitting, Cuff Size: Large)   Pulse 70   Temp 98.3 ?F (36.8 ?C) (Oral)   Resp 16   Ht '5\' 4"'$  (1.626 m)   Wt 156 lb (70.8 kg)   SpO2 97%   BMI 26.78 kg/m?  ? ?BP Readings from Last 3 Encounters:  ?08/13/21 (!) 168/98  ?06/26/21 (!) 160/90  ?05/14/21 120/80  ? ? ?Wt Readings from Last 3 Encounters:  ?08/13/21 156 lb  (70.8 kg)  ?07/29/21 155 lb (70.3 kg)  ?06/26/21 155 lb (70.3 kg)  ? ? ?Physical Exam ?Vitals reviewed.  ?Constitutional:   ?   Appearance: She is not ill-appearing.  ?HENT:  ?   Nose: Nose normal.  ?   Mouth/Throat:  ?   Mouth: Mucous membranes are moist.  ?Eyes:  ?   General: No scleral icterus. ?   Conjunctiva/sclera: Conjunctivae normal.  ?Cardiovascular:  ?   Rate and Rhythm: Normal rate and regular rhythm.  ?   Heart sounds: No murmur heard. ?Pulmonary:  ?   Effort: Pulmonary effort is normal.  ?   Breath sounds: No stridor. No wheezing, rhonchi or rales.  ?Abdominal:  ?   General: Abdomen is flat.  ?   Palpations: There is no mass.  ?   Tenderness: There is no abdominal tenderness. There is no guarding.  ?   Hernia: No hernia is present.  ?Musculoskeletal:     ?   General: Normal range of motion.  ?   Cervical back: Neck supple.  ?   Right lower leg: No edema.  ?   Left lower leg: No edema.  ?Lymphadenopathy:  ?   Cervical: No cervical adenopathy.  ?Skin: ?   General: Skin is warm and dry.  ?Neurological:  ?   General: No focal deficit present.  ?   Mental Status: She is alert.  ?Psychiatric:     ?   Mood and Affect: Mood normal.     ?   Behavior: Behavior normal.  ? ? ?Lab Results  ?Component Value Date  ? WBC 7.5 06/26/2021  ? HGB 13.6 06/26/2021  ? HCT 41.4 06/26/2021  ? PLT 203.0 06/26/2021  ? GLUCOSE 98 06/26/2021  ? CHOL 139 06/26/2021  ? TRIG 101.0 06/26/2021  ? HDL 45.80 06/26/2021  ? LDLDIRECT 160.4 12/19/2008  ? [place] 73 06/26/2021  ? ALT 13 06/26/2021  ? AST 19 06/26/2021  ? NA 139 06/26/2021  ? K 4.0 06/26/2021  ? CL 100 06/26/2021  ? CREATININE 0.91 06/26/2021  ? BUN 20 06/26/2021  ? CO2 29 06/26/2021  ? TSH 1.61 06/26/2021  ? HGBA1C 6.1 06/26/2021  ? ? ?DG Shoulder Right ? ?Result Date: 12/11/2017 ?CLINICAL DATA:  Lateral right shoulder pain since a fall 1 week ago. EXAM: RIGHT SHOULDER - 2+ VIEW COMPARISON:  None. FINDINGS: Diffuse osteopenia. No fracture or dislocation seen. IMPRESSION: No  fracture or dislocation. Electronically Signed   By: Claudie Revering M.D.   On: 12/11/2017 11:38  ? ? ?Assessment & Plan:  ? ?Macyn was seen today for hypertension. ? ?Diagnoses and all orders for this visit: ? ?Essential hypertension- Will add a direct vasodilator to her current  regimen to try to get better control of her blood pressure. ?-     hydrALAZINE (APRESOLINE) 25 MG tablet; Take 1 tablet (25 mg total) by mouth 3 (three) times daily. ? ?Dilated cardiomyopathy ([place])- Her volume status is normal.  Will add a vasodilator. ?-     hydrALAZINE (APRESOLINE) 25 MG tablet; Take 1 tablet (25 mg total) by mouth 3 (three) times daily. ? ?Restless legs syndrome (RLS) ?-     pregabalin (LYRICA) 25 MG capsule; Take 1 capsule (25 mg total) by mouth at bedtime. ? ?Chronic peripheral neuropathic pain ?-     pregabalin (LYRICA) 25 MG capsule; Take 1 capsule (25 mg total) by mouth at bedtime. ? ? ?I have discontinued Tahirah L. Dosher's gabapentin. I am also having her start on hydrALAZINE and pregabalin. Additionally, I am having her maintain her Align, cholecalciferol, calcium carbonate, acetaminophen, Restasis, Premarin, docusate sodium, aspirin EC, lansoprazole, albuterol, metoprolol succinate, rosuvastatin, hydrochlorothiazide, LORazepam, losartan, fluticasone, DULoxetine, and loratadine. ? ?Meds ordered this encounter  ?Medications  ? hydrALAZINE (APRESOLINE) 25 MG tablet  ?  Sig: Take 1 tablet (25 mg total) by mouth 3 (three) times daily.  ?  Dispense:  270 tablet  ?  Refill:  1  ? pregabalin (LYRICA) 25 MG capsule  ?  Sig: Take 1 capsule (25 mg total) by mouth at bedtime.  ?  Dispense:  90 capsule  ?  Refill:  1  ? ? ? ?Follow-up: Return in about 6 months (around 02/13/2022). ? ?Scarlette Calico, MD ?

## 2021-08-13 NOTE — Patient Instructions (Signed)
Hypertension, Adult High blood pressure (hypertension) is when the force of blood pumping through the arteries is too strong. The arteries are the blood vessels that carry blood from the heart throughout the body. Hypertension forces the heart to work harder to pump blood and may cause arteries to become narrow or stiff. Untreated or uncontrolled hypertension can lead to a heart attack, heart failure, a stroke, kidney disease, and other problems. A blood pressure reading consists of a higher number over a lower number. Ideally, your blood pressure should be below 120/80. The first ("top") number is called the systolic pressure. It is a measure of the pressure in your arteries as your heart beats. The second ("bottom") number is called the diastolic pressure. It is a measure of the pressure in your arteries as the heart relaxes. What are the causes? The exact cause of this condition is not known. There are some conditions that result in high blood pressure. What increases the risk? Certain factors may make you more likely to develop high blood pressure. Some of these risk factors are under your control, including: Smoking. Not getting enough exercise or physical activity. Being overweight. Having too much fat, sugar, calories, or salt (sodium) in your diet. Drinking too much alcohol. Other risk factors include: Having a personal history of heart disease, diabetes, high cholesterol, or kidney disease. Stress. Having a family history of high blood pressure and high cholesterol. Having obstructive sleep apnea. Age. The risk increases with age. What are the signs or symptoms? High blood pressure may not cause symptoms. Very high blood pressure (hypertensive crisis) may cause: Headache. Fast or irregular heartbeats (palpitations). Shortness of breath. Nosebleed. Nausea and vomiting. Vision changes. Severe chest pain, dizziness, and seizures. How is this diagnosed? This condition is diagnosed by  measuring your blood pressure while you are seated, with your arm resting on a flat surface, your legs uncrossed, and your feet flat on the floor. The cuff of the blood pressure monitor will be placed directly against the skin of your upper arm at the level of your heart. Blood pressure should be measured at least twice using the same arm. Certain conditions can cause a difference in blood pressure between your right and left arms. If you have a high blood pressure reading during one visit or you have normal blood pressure with other risk factors, you may be asked to: Return on a different day to have your blood pressure checked again. Monitor your blood pressure at home for 1 week or longer. If you are diagnosed with hypertension, you may have other blood or imaging tests to help your health care provider understand your overall risk for other conditions. How is this treated? This condition is treated by making healthy lifestyle changes, such as eating healthy foods, exercising more, and reducing your alcohol intake. You may be referred for counseling on a healthy diet and physical activity. Your health care provider may prescribe medicine if lifestyle changes are not enough to get your blood pressure under control and if: Your systolic blood pressure is above 130. Your diastolic blood pressure is above 80. Your personal target blood pressure may vary depending on your medical conditions, your age, and other factors. Follow these instructions at home: Eating and drinking  Eat a diet that is high in fiber and potassium, and low in sodium, added sugar, and fat. An example of this eating plan is called the DASH diet. DASH stands for Dietary Approaches to Stop Hypertension. To eat this way: Eat   plenty of fresh fruits and vegetables. Try to fill one half of your plate at each meal with fruits and vegetables. Eat whole grains, such as whole-wheat pasta, brown rice, or whole-grain bread. Fill about one  fourth of your plate with whole grains. Eat or drink low-fat dairy products, such as skim milk or low-fat yogurt. Avoid fatty cuts of meat, processed or cured meats, and poultry with skin. Fill about one fourth of your plate with lean proteins, such as fish, chicken without skin, beans, eggs, or tofu. Avoid pre-made and processed foods. These tend to be higher in sodium, added sugar, and fat. Reduce your daily sodium intake. Many people with hypertension should eat less than 1,500 mg of sodium a day. Do not drink alcohol if: Your health care provider tells you not to drink. You are pregnant, may be pregnant, or are planning to become pregnant. If you drink alcohol: Limit how much you have to: 0-1 drink a day for women. 0-2 drinks a day for men. Know how much alcohol is in your drink. In the [place] one drink equals one 12 oz bottle of beer (355 mL), one 5 oz glass of wine (148 mL), or one 1 oz glass of hard liquor (44 mL). Lifestyle  Work with your health care provider to maintain a healthy body weight or to lose weight. Ask what an ideal weight is for you. Get at least 30 minutes of exercise that causes your heart to beat faster (aerobic exercise) most days of the week. Activities may include walking, swimming, or biking. Include exercise to strengthen your muscles (resistance exercise), such as Pilates or lifting weights, as part of your weekly exercise routine. Try to do these types of exercises for 30 minutes at least 3 days a week. Do not use any products that contain nicotine or tobacco. These products include cigarettes, chewing tobacco, and vaping devices, such as e-cigarettes. If you need help quitting, ask your health care provider. Monitor your blood pressure at home as told by your health care provider. Keep all follow-up visits. This is important. Medicines Take over-the-counter and prescription medicines only as told by your health care provider. Follow directions carefully. Blood  pressure medicines must be taken as prescribed. Do not skip doses of blood pressure medicine. Doing this puts you at risk for problems and can make the medicine less effective. Ask your health care provider about side effects or reactions to medicines that you should watch for. Contact a health care provider if you: Think you are having a reaction to a medicine you are taking. Have headaches that keep coming back (recurring). Feel dizzy. Have swelling in your ankles. Have trouble with your vision. Get help right away if you: Develop a severe headache or confusion. Have unusual weakness or numbness. Feel faint. Have severe pain in your chest or abdomen. Vomit repeatedly. Have trouble breathing. These symptoms may be an emergency. Get help right away. Call 911. Do not wait to see if the symptoms will go away. Do not drive yourself to the hospital. Summary Hypertension is when the force of blood pumping through your arteries is too strong. If this condition is not controlled, it may put you at risk for serious complications. Your personal target blood pressure may vary depending on your medical conditions, your age, and other factors. For most people, a normal blood pressure is less than 120/80. Hypertension is treated with lifestyle changes, medicines, or a combination of both. Lifestyle changes include losing weight, eating a healthy,   low-sodium diet, exercising more, and limiting alcohol. This information is not intended to replace advice given to you by your health care provider. Make sure you discuss any questions you have with your health care provider. Document Revised: 01/22/2021 Document Reviewed: 01/22/2021 Elsevier Patient Education  2023 [employer]  

## 2021-08-19 ENCOUNTER — Other Ambulatory Visit: Payer: Self-pay | Admitting: Internal Medicine

## 2021-08-19 ENCOUNTER — Telehealth: Payer: Self-pay | Admitting: Internal Medicine

## 2021-08-19 DIAGNOSIS — I10 Essential (primary) hypertension: Secondary | ICD-10-CM

## 2021-08-19 DIAGNOSIS — I2510 Atherosclerotic heart disease of native coronary artery without angina pectoris: Secondary | ICD-10-CM

## 2021-08-19 DIAGNOSIS — I420 Dilated cardiomyopathy: Secondary | ICD-10-CM

## 2021-08-19 MED ORDER — LOSARTAN POTASSIUM 50 MG PO TABS: 50.0000 mg | ORAL_TABLET | Freq: Two times a day (BID) | ORAL | 1 refills | Status: DC

## 2021-08-19 NOTE — Telephone Encounter (Signed)
Pt called in and states she is having a discrepancy with Losartan.   States she is better with taking '50mg'$  in the morning, and then '50mg'$  in the evening.   States she has been having trouble with her blood pressure, and it tends to work better that way for her. Please call in '50mg'$  2x daily.   [id number] [place]  [phone] Fax:  [phone]

## 2021-08-28 DIAGNOSIS — E785 Hyperlipidemia, unspecified: Secondary | ICD-10-CM | POA: Diagnosis not present

## 2021-08-28 DIAGNOSIS — I2510 Atherosclerotic heart disease of native coronary artery without angina pectoris: Secondary | ICD-10-CM

## 2021-08-28 DIAGNOSIS — I10 Essential (primary) hypertension: Secondary | ICD-10-CM

## 2021-08-28 DIAGNOSIS — M8580 Other specified disorders of bone density and structure, unspecified site: Secondary | ICD-10-CM

## 2021-09-02 ENCOUNTER — Telehealth: Payer: Self-pay | Admitting: Physician Assistant

## 2021-09-02 ENCOUNTER — Other Ambulatory Visit: Payer: Self-pay

## 2021-09-02 MED ORDER — ONDANSETRON HCL 4 MG PO TABS: 4.0000 mg | ORAL_TABLET | Freq: Four times a day (QID) | ORAL | 0 refills | Status: DC | PRN

## 2021-09-02 NOTE — Telephone Encounter (Signed)
Discussed medication with the patient. [facility] confirms the [facility] as [facility] [address]. She plans to use Tylenol for the headache.

## 2021-09-02 NOTE — Telephone Encounter (Signed)
Inbound call from patient wanting to make an appointment for burping and nausea. Patient was scheduled for 6/29 at 9:00 with Amy. Patient was wanting to see if there was anyway in the meantime she could have something sent over to her pharmacy for the nausea. Please advise.

## 2021-09-02 NOTE — Telephone Encounter (Signed)
Spoke with the patient. She reports generalized nausea without vomiting. She is forcing fluids, but too nauseated for much more than half a piece of toast. Burping and feeling like "things don't sit well. I have no appetite." Afebrile. A little headache. Normal bowel movements. Still on Prevacid taking it once or twice a day. She specifically asks for anti-nausea medication.

## 2021-09-03 ENCOUNTER — Other Ambulatory Visit (HOSPITAL_BASED_OUTPATIENT_CLINIC_OR_DEPARTMENT_OTHER): Payer: Self-pay

## 2021-09-03 ENCOUNTER — Emergency Department (HOSPITAL_BASED_OUTPATIENT_CLINIC_OR_DEPARTMENT_OTHER): Payer: PPO

## 2021-09-03 ENCOUNTER — Encounter (HOSPITAL_BASED_OUTPATIENT_CLINIC_OR_DEPARTMENT_OTHER): Payer: Self-pay | Admitting: Emergency Medicine

## 2021-09-03 ENCOUNTER — Other Ambulatory Visit: Payer: Self-pay

## 2021-09-03 ENCOUNTER — Emergency Department (HOSPITAL_BASED_OUTPATIENT_CLINIC_OR_DEPARTMENT_OTHER)
Admission: EM | Admit: 2021-09-03 | Discharge: 2021-09-03 | Disposition: A | Discharge status: Home / Self Care | Payer: PPO | Attending: Emergency Medicine | Admitting: Emergency Medicine

## 2021-09-03 DIAGNOSIS — K769 Liver disease, unspecified: Secondary | ICD-10-CM | POA: Diagnosis not present

## 2021-09-03 DIAGNOSIS — I10 Essential (primary) hypertension: Secondary | ICD-10-CM | POA: Insufficient documentation

## 2021-09-03 DIAGNOSIS — I700 Atherosclerosis of aorta: Secondary | ICD-10-CM | POA: Diagnosis not present

## 2021-09-03 DIAGNOSIS — Z20822 Contact with and (suspected) exposure to covid-19: Secondary | ICD-10-CM | POA: Diagnosis not present

## 2021-09-03 DIAGNOSIS — R1013 Epigastric pain: Secondary | ICD-10-CM

## 2021-09-03 DIAGNOSIS — I2510 Atherosclerotic heart disease of native coronary artery without angina pectoris: Secondary | ICD-10-CM | POA: Diagnosis not present

## 2021-09-03 DIAGNOSIS — E860 Dehydration: Secondary | ICD-10-CM | POA: Insufficient documentation

## 2021-09-03 DIAGNOSIS — R059 Cough, unspecified: Secondary | ICD-10-CM | POA: Diagnosis not present

## 2021-09-03 DIAGNOSIS — Z79899 Other long term (current) drug therapy: Secondary | ICD-10-CM | POA: Diagnosis not present

## 2021-09-03 DIAGNOSIS — R112 Nausea with vomiting, unspecified: Secondary | ICD-10-CM | POA: Diagnosis not present

## 2021-09-03 DIAGNOSIS — Z7982 Long term (current) use of aspirin: Secondary | ICD-10-CM | POA: Insufficient documentation

## 2021-09-03 DIAGNOSIS — N2889 Other specified disorders of kidney and ureter: Secondary | ICD-10-CM | POA: Diagnosis not present

## 2021-09-03 LAB — CBC WITH DIFFERENTIAL/PLATELET
Abs Immature Granulocytes: 0 10*3/uL (ref 0.00–0.07)
Band Neutrophils: 6 %
Basophils Absolute: 0 10*3/uL (ref 0.0–0.1)
Basophils Relative: 0 %
Eosinophils Absolute: 0.2 10*3/uL (ref 0.0–0.5)
Eosinophils Relative: 3 %
HCT: 43.4 % (ref 36.0–46.0)
Hemoglobin: 14.2 g/dL (ref 12.0–15.0)
Lymphocytes Relative: 18 %
Lymphs Abs: 1.2 10*3/uL (ref 0.7–4.0)
MCH: 30.1 pg (ref 26.0–34.0)
MCHC: 32.7 g/dL (ref 30.0–36.0)
MCV: 91.9 fL (ref 80.0–100.0)
Monocytes Absolute: 0.4 10*3/uL (ref 0.1–1.0)
Monocytes Relative: 6 %
Neutro Abs: 5 10*3/uL (ref 1.7–7.7)
Neutrophils Relative %: 67 %
Platelets: 161 10*3/uL (ref 150–400)
RBC: 4.72 MIL/uL (ref 3.87–5.11)
RDW: 13.6 % (ref 11.5–15.5)
WBC: 6.8 10*3/uL (ref 4.0–10.5)
nRBC: 0 % (ref 0.0–0.2)

## 2021-09-03 LAB — URINALYSIS, ROUTINE W REFLEX MICROSCOPIC
Bilirubin Urine: NEGATIVE
Glucose, UA: NEGATIVE mg/dL
Hgb urine dipstick: NEGATIVE
Ketones, ur: NEGATIVE mg/dL
Leukocytes,Ua: NEGATIVE
Nitrite: NEGATIVE
Protein, ur: NEGATIVE mg/dL
Specific Gravity, Urine: 1.023 (ref 1.005–1.030)
pH: 7.5 (ref 5.0–8.0)

## 2021-09-03 LAB — COMPREHENSIVE METABOLIC PANEL
ALT: 111 U/L — ABNORMAL HIGH (ref 0–44)
AST: 49 U/L — ABNORMAL HIGH (ref 15–41)
Albumin: 3.6 g/dL (ref 3.5–5.0)
Alkaline Phosphatase: 212 U/L — ABNORMAL HIGH (ref 38–126)
Anion gap: 11 (ref 5–15)
BUN: 14 mg/dL (ref 8–23)
CO2: 31 mmol/L (ref 22–32)
Calcium: 9.7 mg/dL (ref 8.9–10.3)
Chloride: 94 mmol/L — ABNORMAL LOW (ref 98–111)
Creatinine, Ser: 1.06 mg/dL — ABNORMAL HIGH (ref 0.44–1.00)
GFR, Estimated: 50 mL/min — ABNORMAL LOW (ref >60)
Glucose, Bld: 104 mg/dL — ABNORMAL HIGH (ref 70–99)
Potassium: 3.8 mmol/L (ref 3.5–5.1)
Sodium: 136 mmol/L (ref 135–145)
Total Bilirubin: 0.8 mg/dL (ref 0.3–1.2)
Total Protein: 6.7 g/dL (ref 6.5–8.1)

## 2021-09-03 LAB — RESP PANEL BY RT-PCR (FLU A&B, COVID) ARPGX2
Influenza A by PCR: NEGATIVE
Influenza B by PCR: NEGATIVE
SARS Coronavirus 2 by RT PCR: NEGATIVE

## 2021-09-03 LAB — LIPASE, BLOOD: Lipase: 18 U/L (ref 11–51)

## 2021-09-03 MED ORDER — ONDANSETRON 4 MG PO TBDP
4.0000 mg | ORAL_TABLET | Freq: Three times a day (TID) | ORAL | 0 refills | Status: DC | PRN
  Filled 2021-09-03: qty 20, 7d supply, fill #0

## 2021-09-03 MED ORDER — SODIUM CHLORIDE 0.9 % IV BOLUS
1000.0000 mL | Freq: Once | INTRAVENOUS | Status: AC
  Administered 2021-09-03: 1000 mL via INTRAVENOUS

## 2021-09-03 MED ORDER — IOHEXOL 300 MG/ML  SOLN
100.0000 mL | Freq: Once | INTRAMUSCULAR | Status: AC | PRN
  Administered 2021-09-03: 80 mL via INTRAVENOUS

## 2021-09-03 MED ORDER — ONDANSETRON HCL 4 MG/2ML IJ SOLN
4.0000 mg | Freq: Once | INTRAMUSCULAR | Status: AC
Start: 2021-09-03 — End: 2021-09-03
  Administered 2021-09-03: 4 mg via INTRAVENOUS
  Filled 2021-09-03: qty 2

## 2021-09-03 NOTE — ED Triage Notes (Signed)
Pt c/o of intermittent nausea and epigastric pain since Saturday. Pt denis any episodes of emesis. Pt states she has only had water to drink

## 2021-09-03 NOTE — ED Notes (Signed)
Pt had drank cold ginger ale

## 2021-09-03 NOTE — ED Provider Notes (Signed)
[facility] Provider Note   CSN: [id number] Arrival date & time: 09/03/21  5027     History  Chief Complaint  Patient presents with   Nausea    Krystal [name] is a 88 y.o. female.  Pt is an 88 yo female with a pmhx significant for htn, cad, hyperlipidemia, gerd, oa, ibs, sbo, and osteoporosis.  Pt said she has been feeling very nauseous since Saturday (June 3).  She said she has been able to keep down some water and ginger ale, but has not eaten much.  She feels very weak when she tries to get up.  She has some epigastric pain.  She feels constipated, but had a small bm yesterday.  No vomiting.  No fever.      Home Medications Prior to Admission medications   Medication Sig Start Date End Date Taking? Authorizing Provider  ondansetron (ZOFRAN-ODT) 4 MG disintegrating tablet Take 1 tablet (4 mg total) by mouth every 8 (eight) hours as needed for nausea or vomiting. 09/03/21  Yes Isla Pence, MD  acetaminophen (TYLENOL) 500 MG tablet Take 500 mg by mouth every 4 (four) hours as needed for mild pain or headache.    [provider]  albuterol (VENTOLIN HFA) 108 (90 Base) MCG/ACT inhaler TAKE 2 PUFFS BY MOUTH EVERY 6 HOURS AS NEEDED FOR WHEEZE OR SHORTNESS OF BREATH 01/26/21   Janith Lima, MD  aspirin EC 81 MG tablet Take 81 mg by mouth daily. Swallow whole.    [provider]  calcium carbonate (OS-CAL) 600 MG TABS tablet Take 600 mg by mouth 2 (two) times daily with a meal.    [provider]  cholecalciferol (VITAMIN D) 1000 UNITS tablet Take 2,000 Units by mouth daily.     [provider]  docusate sodium (COLACE) 100 MG capsule Take 100 mg by mouth daily as needed.    [provider]  DULoxetine (CYMBALTA) 20 MG capsule Take 1 capsule (20 mg total) by mouth daily. 08/01/21   Janith Lima, MD  fluticasone (FLONASE) 50 MCG/ACT nasal spray SPRAY 1 SPRAY INTO BOTH NOSTRILS DAILY. 07/30/21   Janith Lima, MD   hydrALAZINE (APRESOLINE) 25 MG tablet Take 1 tablet (25 mg total) by mouth 3 (three) times daily. 08/13/21   Janith Lima, MD  hydrochlorothiazide (HYDRODIURIL) 25 MG tablet Take 1 tablet (25 mg total) by mouth daily. 06/26/21   Janith Lima, MD  lansoprazole (PREVACID) 15 MG capsule TAKE 1 CAPSULE (15 MG TOTAL) BY MOUTH 2 (TWO) TIMES DAILY BEFORE A MEAL. Patient taking differently: Take 15 mg by mouth daily. 01/01/21   Zehr, Janett Billow D, PA-C  loratadine (CLARITIN) 10 MG tablet TAKE 1 TABLET BY MOUTH EVERY DAY 08/05/21   Janith Lima, MD  LORazepam (ATIVAN) 0.5 MG tablet Take 1 tablet (0.5 mg total) by mouth at bedtime as needed for anxiety. 06/26/21   Janith Lima, MD  losartan (COZAAR) 50 MG tablet Take 1 tablet (50 mg total) by mouth 2 (two) times daily. 08/19/21   Janith Lima, MD  metoprolol succinate (TOPROL-XL) 25 MG 24 hr tablet TAKE 1 TABLET (25 MG TOTAL) BY MOUTH DAILY. 04/10/21   Janith Lima, MD  ondansetron (ZOFRAN) 4 MG tablet Take 1 tablet (4 mg total) by mouth every 6 (six) hours as needed for nausea or vomiting. 09/02/21   Esterwood, Amy S, PA-C  pregabalin (LYRICA) 25 MG capsule Take 1 capsule (25 mg total) by mouth  at bedtime. 08/13/21   Janith Lima, MD  PREMARIN vaginal cream APPLY 1 APPLICATORFUL 2 3 TIMES A WEEK AS DIRECTED 12/20/18   [provider]  Probiotic Product (ALIGN) 4 MG CAPS Take 4 mg by mouth as needed.     [provider]  RESTASIS 0.05 % ophthalmic emulsion  03/06/18   [provider]  rosuvastatin (CRESTOR) 10 MG tablet Take 1 tablet (10 mg total) by mouth daily. 06/26/21   Janith Lima, MD      Allergies    Atorvastatin, Simvastatin, Alendronate sodium, Cefdinir, Doxycycline, Esomeprazole magnesium, Levofloxacin, Omeprazole, Other, Penicillins, Amlodipine, and Shellfish-derived products    Review of Systems   Review of Systems  Gastrointestinal:  Positive for abdominal pain and nausea.  All other systems reviewed and  are negative.  Physical Exam Updated Vital Signs BP 139/63   Pulse 72   Temp (!) 97.4 F (36.3 C) (Oral)   Resp 17   Ht '5\' 6"'$  (1.676 m)   Wt 68 kg   SpO2 97%   BMI 24.21 kg/m  Physical Exam Vitals and nursing note reviewed.  Constitutional:      Appearance: Normal appearance.  HENT:     Head: Normocephalic and atraumatic.     Right Ear: External ear normal.     Left Ear: External ear normal.     Nose: Nose normal.     Mouth/Throat:     Mouth: Mucous membranes are dry.     Pharynx: Oropharynx is clear.  Eyes:     Extraocular Movements: Extraocular movements intact.     Conjunctiva/sclera: Conjunctivae normal.     Pupils: Pupils are equal, round, and reactive to light.  Cardiovascular:     Rate and Rhythm: Normal rate and regular rhythm.     Pulses: Normal pulses.     Heart sounds: Normal heart sounds.  Pulmonary:     Effort: Pulmonary effort is normal.     Breath sounds: Normal breath sounds.  Abdominal:     General: Abdomen is flat. Bowel sounds are normal.     Palpations: Abdomen is soft.     Tenderness: There is abdominal tenderness in the epigastric area.  Musculoskeletal:        General: Normal range of motion.     Cervical back: Normal range of motion and neck supple.  Skin:    General: Skin is warm.     Capillary Refill: Capillary refill takes less than 2 seconds.  Neurological:     General: No focal deficit present.     Mental Status: She is alert and oriented to person, place, and time.  Psychiatric:        Mood and Affect: Mood normal.        Behavior: Behavior normal.    ED Results / Procedures / Treatments   Labs (all labs ordered are listed, but only abnormal results are displayed) Labs Reviewed  COMPREHENSIVE METABOLIC PANEL - Abnormal; Notable for the following components:      Result Value   Chloride 94 (*)    Glucose, Bld 104 (*)    Creatinine, Ser 1.06 (*)    AST 49 (*)    ALT 111 (*)    Alkaline Phosphatase 212 (*)    GFR, Estimated  50 (*)    All other components within normal limits  URINALYSIS, ROUTINE W REFLEX MICROSCOPIC - Abnormal; Notable for the following components:   Color, Urine COLORLESS (*)    All other components within  normal limits  RESP PANEL BY RT-PCR (FLU A&B, COVID) ARPGX2  CBC WITH DIFFERENTIAL/PLATELET  LIPASE, BLOOD    EKG EKG Interpretation  Date/Time:  Tuesday September 03 2021 08:35:08 EDT Ventricular Rate:  91 PR Interval:  196 QRS Duration: 153 QT Interval:  410 QTC Calculation: 505 R Axis:   -2 Text Interpretation: Sinus rhythm Left bundle branch block No significant change since last tracing Confirmed by Isla Pence [phone]) on 09/03/2021 8:38:19 AM  Radiology CT ABDOMEN PELVIS W CONTRAST  Result Date: 09/03/2021 CLINICAL DATA:  Intermittent nausea and epigastric pain since Saturday. Hypertension. Irritable bowel syndrome. Small-bowel obstruction. EXAM: CT ABDOMEN AND PELVIS WITH CONTRAST TECHNIQUE: Multidetector CT imaging of the abdomen and pelvis was performed using the standard protocol following bolus administration of intravenous contrast. RADIATION DOSE REDUCTION: This exam was performed according to the departmental dose-optimization program which includes automated exposure control, adjustment of the mA and/or kV according to patient size and/or use of iterative reconstruction technique. CONTRAST:  32m OMNIPAQUE IOHEXOL 300 MG/ML  SOLN COMPARISON:  09/06/2015 plain film.  CT 09/05/2015 FINDINGS: Lower chest: Subpleural 4 mm left lower lobe pulmonary nodule is unchanged on 03/03 and considered benign. Normal heart size without pericardial or pleural effusion. Small hiatal hernia, slightly increased. Hepatobiliary: Tiny right hepatic lobe low-density lesion is likely a cyst. Normal gallbladder, without biliary ductal dilatation. Pancreas: Similar appearance of fatty replacement involving the pancreatic body, tail, uncinate process. No duct dilatation or acute inflammation. Spleen: Normal  in size, without focal abnormality. Adrenals/Urinary Tract: Normal adrenal glands. Upper pole right renal 6.1 cm fluid density lesion is most consistent with a cyst . In the absence of clinically indicated signs/symptoms require(s) no independent follow-up. Normal left kidney, without hydronephrosis. Normal urinary bladder. Stomach/Bowel: Decompressed remainder of the stomach. Scattered colonic diverticula. Normal terminal ileum and appendix. Normal small bowel. Vascular/Lymphatic: Aortic atherosclerosis. Retroaortic left renal vein. Significant atherosclerosis of the proximal SMA. No abdominopelvic adenopathy. Reproductive: Hysterectomy.  No adnexal mass. Other: No significant free fluid. Mild pelvic floor laxity. No free intraperitoneal air. Musculoskeletal: Disc bulges including at the lumbosacral junction, L4-5 level. IMPRESSION: 1. No acute process in the abdomen or pelvis. 2. Small hiatal hernia, slightly increased. 3. Aortic Atherosclerosis (ICD10-I70.0). Electronically Signed   By: KAbigail MiyamotoM.D.   On: 09/03/2021 09:55   DG Chest Portable 1 View  Result Date: 09/03/2021 CLINICAL DATA:  Cough.  Nausea and vomiting. EXAM: PORTABLE CHEST 1 VIEW COMPARISON:  09/17/2016 FINDINGS: Normal cardiomediastinal contours. Aortic atherosclerotic calcifications. No pleural effusion or edema identified. No airspace opacities. IMPRESSION: No acute cardiopulmonary abnormalities. Aortic Atherosclerosis (ICD10-I70.0). Electronically Signed   By: TKerby MoorsM.D.   On: 09/03/2021 08:52    Procedures Procedures    Medications Ordered in ED Medications  sodium chloride 0.9 % bolus 1,000 mL (1,000 mLs Intravenous New Bag/Given 09/03/21 0906)  ondansetron (ZOFRAN) injection 4 mg (4 mg Intravenous Given 09/03/21 0902)  iohexol (OMNIPAQUE) 300 MG/ML solution 100 mL (80 mLs Intravenous Contrast Given 09/03/21 0935)    ED Course/ Medical Decision Making/ A&P                           Medical Decision Making Amount  and/or Complexity of Data Reviewed Labs: ordered. Radiology: ordered.  Risk Prescription drug management.   This patient presents to the ED for concern of abd pain, this involves an extensive number of treatment options, and is a complaint that carries with it a  high risk of complications and morbidity.  The differential diagnosis includes pancreatitis, cholecystitis, cad, uti, gastritis   Co morbidities that complicate the patient evaluation  htn, cad, hyperlipidemia, gerd, oa, ibs, sbo, and osteoporosis   Additional history obtained:  Additional history obtained from epic chart review  Lab Tests:  I Ordered, and personally interpreted labs.  The pertinent results include:  cbc nl, lip nl, cmp with slightly bumped lfts and mild increase in Cr; ua neg   Imaging Studies ordered:  I ordered imaging studies including ct abd/pelvis and cxr  I independently visualized and interpreted imaging which showed  CXR: opacities.     IMPRESSION:  No acute cardiopulmonary abnormalities.     Aortic Atherosclerosis (ICD10-I70.0).      CT abd/pelvis:   IMPRESSION:  1. No acute process in the abdomen or pelvis.  2. Small hiatal hernia, slightly increased.  3. Aortic Atherosclerosis (ICD10-I70.0).   I agree with the radiologist interpretation   Cardiac Monitoring:  The patient was maintained on a cardiac monitor.  I personally viewed and interpreted the cardiac monitored which showed an underlying rhythm of: nsr   Medicines ordered and prescription drug management:  I ordered medication including zofran and ivfs  for nausea and dehydration  Reevaluation of the patient after these medicines showed that the patient improved I have reviewed the patients home medicines and have made adjustments as needed   Test Considered:  ct   Critical Interventions:  Ivfs and zofran   Problem List / ED Course:  N/v:  likely due to viral etiology.  CT abd/pelvis nl.  Pt feels much  better after fluids and zofran.  She is able to tolerate po fluids.  She is to return if worse.  F/u with pcp.   Reevaluation:  After the interventions noted above, I reevaluated the patient and found that they have :improved   Social Determinants of Health:  Lives at home with husband   Dispostion:  After consideration of the diagnostic results and the patients response to treatment, I feel that the patent would benefit from discharge with outpatient f/u.          Final Clinical Impression(s) / ED Diagnoses Final diagnoses:  Dehydration  Epigastric pain    Rx / DC Orders ED Discharge Orders          Ordered    ondansetron (ZOFRAN-ODT) 4 MG disintegrating tablet  Every 8 hours PRN        09/03/21 1011              Isla Pence, MD 09/03/21 1235

## 2021-09-03 NOTE — ED Notes (Signed)
Patient transported to CT 

## 2021-09-04 ENCOUNTER — Ambulatory Visit: Payer: PPO | Admitting: Internal Medicine

## 2021-09-12 ENCOUNTER — Other Ambulatory Visit: Payer: Self-pay | Admitting: Internal Medicine

## 2021-09-17 DIAGNOSIS — H43813 Vitreous degeneration, bilateral: Secondary | ICD-10-CM | POA: Diagnosis not present

## 2021-09-17 DIAGNOSIS — H04123 Dry eye syndrome of bilateral lacrimal glands: Secondary | ICD-10-CM | POA: Diagnosis not present

## 2021-09-26 ENCOUNTER — Ambulatory Visit: Payer: PPO | Admitting: Physician Assistant

## 2021-09-30 ENCOUNTER — Other Ambulatory Visit: Payer: Self-pay | Admitting: Internal Medicine

## 2021-09-30 DIAGNOSIS — I10 Essential (primary) hypertension: Secondary | ICD-10-CM

## 2021-10-11 ENCOUNTER — Other Ambulatory Visit: Payer: Self-pay | Admitting: Internal Medicine

## 2021-10-11 DIAGNOSIS — I2510 Atherosclerotic heart disease of native coronary artery without angina pectoris: Secondary | ICD-10-CM

## 2021-10-11 DIAGNOSIS — I10 Essential (primary) hypertension: Secondary | ICD-10-CM

## 2021-11-08 ENCOUNTER — Ambulatory Visit (INDEPENDENT_AMBULATORY_CARE_PROVIDER_SITE_OTHER): Payer: PPO | Admitting: Internal Medicine

## 2021-11-08 ENCOUNTER — Encounter: Payer: Self-pay | Admitting: Internal Medicine

## 2021-11-08 VITALS — BP 146/80 | HR 69 | Ht 66.0 in | Wt 153.6 lb

## 2021-11-08 DIAGNOSIS — J301 Allergic rhinitis due to pollen: Secondary | ICD-10-CM

## 2021-11-08 DIAGNOSIS — I10 Essential (primary) hypertension: Secondary | ICD-10-CM

## 2021-11-08 DIAGNOSIS — Z23 Encounter for immunization: Secondary | ICD-10-CM

## 2021-11-08 DIAGNOSIS — S0101XA Laceration without foreign body of scalp, initial encounter: Secondary | ICD-10-CM | POA: Insufficient documentation

## 2021-11-08 NOTE — Patient Instructions (Signed)
You had the Tdap tetanus shot today  Ok to add the Allegra OTC 180 mg per day for allergies  Please continue all other medications as before, and refills have been done if requested.  Please have the pharmacy call with any other refills you may need.  Please continue your efforts at being more active, low cholesterol diet, and weight control.  Please keep your appointments with your specialists as you may have planned

## 2021-11-08 NOTE — Progress Notes (Unsigned)
Patient ID: Krystal [name], female   DOB: 05-29-1929, 88 y.o.   MRN: [id number]        Chief Complaint: follow up post head laceration, htn, allergies       HPI:  Krystal [name] is a 88 y.o. female here with c/o post head trauma 3 days ago with simply hitting the head on a hard overhead edge with moving about in the attic; Did have significant bleeding but stopped and did not go to ED.  Pt denies new neurological symptoms such as new headache, or facial or extremity weakness or numbness  Pt denies chest pain, increased sob or doe, wheezing, orthopnea, PND, increased LE swelling, palpitations, dizziness or syncope.   Pt denies polydipsia, polyuria, or new focal neuro s/s.   Does have several wks ongoing nasal allergy symptoms with clearish congestion, itch and sneezing, without fever, pain, ST, cough, swelling or wheezing.BP has been < 140/90 at home       Wt Readings from Last 3 Encounters:  11/08/21 153 lb 9.6 oz (69.7 kg)  09/03/21 150 lb (68 kg)  08/13/21 156 lb (70.8 kg)   BP Readings from Last 3 Encounters:  11/08/21 (!) 146/80  09/03/21 133/66  08/13/21 (!) 168/98         Past Medical History:  Diagnosis Date   Adenomatous colon polyp    Allergy    seasonal   Anemia    pt. denies   Anxiety    CAD (coronary artery disease)    Cataract    cataracts removed left eye.   Chronic lower back pain    Diverticulosis    GERD (gastroesophageal reflux disease)    Headache(784.0)    Hiatal hernia    Hyperlipidemia    Hypertension    IBS (irritable bowel syndrome) 10/13/2011   Osteoarthritis    Osteoporosis    Renal cysts, acquired, bilateral    SBO (small bowel obstruction) ([place]) 09/05/2015   Venous insufficiency    Past Surgical History:  Procedure Laterality Date   ABDOMINAL HYSTERECTOMY     BLADDER REPAIR     CARDIAC CATHETERIZATION  01/12/2002   90% stenosis of the left circumflex, stented with a 3x3m Cypher stent, resulting in reduction of 90% to 10%    CARDIOVASCULAR  STRESS TEST  03/22/2007   Mild inferolateral thinning toward the apex without significant ischemia. Nondiagnostic electrocardiogram.   CATARACT EXTRACTION     bilateral   COLONOSCOPY  08/17/2008   adenomatous polyp, diverticulosis, external hemorrhoids   COLONOSCOPY W/ BIOPSIES     multiple    LEFT LOWER EXTREMITY VENOUS DOPPLER  06/18/2011   No evidence of left lower extremity DVT   TRANSTHORACIC ECHOCARDIOGRAM  11/18/2012   EF 418-56% LV systolic mild-moderately reduced, mild-moderate mitral valve regurg, mild-moderate tricuspid valve regurg. NSR-LBBB with occas PVCs   UPPER GASTROINTESTINAL ENDOSCOPY  07/03/2010   hiatal hernia   VARICOSE VEIN SURGERY      reports that she has never smoked. She has never used smokeless tobacco. She reports that she does not drink alcohol and does not use drugs. family history includes Heart disease in her father; Stroke in her sister. Allergies  Allergen Reactions   Atorvastatin Other (See Comments)    Muscle aches   Simvastatin Other (See Comments)    Myalgias    Alendronate Sodium     REACTION: pt states INTOL to Fosamax w/ esophagitis   Cefdinir     REACTION: diarrhea   Doxycycline  diarrhea   Esomeprazole Magnesium     REACTION: pt states INTOL to Nexium   Levofloxacin     REACTION: diarrhea   Omeprazole     REACTION: pt states INTOL to Prilosec   Other     Pneumonia vaccine---felt tired and achy and sick x 6 months to get over this.   Penicillins Hives   Amlodipine Other (See Comments)    Ankle edema   Shellfish-Derived Products Hives, Nausea Only and Rash   Current Outpatient Medications on File Prior to Visit  Medication Sig Dispense Refill   acetaminophen (TYLENOL) 500 MG tablet Take 500 mg by mouth every 4 (four) hours as needed for mild pain or headache.     albuterol (VENTOLIN HFA) 108 (90 Base) MCG/ACT inhaler TAKE 2 PUFFS BY MOUTH EVERY 6 HOURS AS NEEDED FOR WHEEZE OR SHORTNESS OF BREATH 8.5 each 1   aspirin EC 81 MG  tablet Take 81 mg by mouth daily. Swallow whole.     calcium carbonate (OS-CAL) 600 MG TABS tablet Take 600 mg by mouth 2 (two) times daily with a meal.     cholecalciferol (VITAMIN D) 1000 UNITS tablet Take 2,000 Units by mouth daily.      docusate sodium (COLACE) 100 MG capsule Take 100 mg by mouth daily as needed.     DULoxetine (CYMBALTA) 20 MG capsule Take 1 capsule (20 mg total) by mouth daily. 90 capsule 0   fluticasone (FLONASE) 50 MCG/ACT nasal spray SPRAY 1 SPRAY INTO BOTH NOSTRILS DAILY. 48 mL 1   hydrochlorothiazide (HYDRODIURIL) 25 MG tablet TAKE 1 TABLET (25 MG TOTAL) BY MOUTH DAILY. 90 tablet 0   lansoprazole (PREVACID) 15 MG capsule TAKE 1 CAPSULE (15 MG TOTAL) BY MOUTH 2 (TWO) TIMES DAILY BEFORE A MEAL. (Patient taking differently: Take 15 mg by mouth daily.) 180 capsule 3   loratadine (CLARITIN) 10 MG tablet TAKE 1 TABLET BY MOUTH EVERY DAY 90 tablet 3   LORazepam (ATIVAN) 0.5 MG tablet Take 1 tablet (0.5 mg total) by mouth at bedtime as needed for anxiety. 90 tablet 1   losartan (COZAAR) 50 MG tablet Take 1 tablet (50 mg total) by mouth 2 (two) times daily. 180 tablet 1   metoprolol succinate (TOPROL-XL) 25 MG 24 hr tablet TAKE 1 TABLET (25 MG TOTAL) BY MOUTH DAILY. 90 tablet 1   ondansetron (ZOFRAN) 4 MG tablet Take 1 tablet (4 mg total) by mouth every 6 (six) hours as needed for nausea or vomiting. 40 tablet 0   ondansetron (ZOFRAN-ODT) 4 MG disintegrating tablet Take 1 tablet (4 mg total) by mouth every 8 (eight) hours as needed for nausea or vomiting. 20 tablet 0   pregabalin (LYRICA) 25 MG capsule Take 1 capsule (25 mg total) by mouth at bedtime. 90 capsule 1   PREMARIN vaginal cream APPLY 1 APPLICATORFUL 2 3 TIMES A WEEK AS DIRECTED     Probiotic Product (ALIGN) 4 MG CAPS Take 4 mg by mouth as needed.      RESTASIS 0.05 % ophthalmic emulsion      rosuvastatin (CRESTOR) 10 MG tablet Take 1 tablet (10 mg total) by mouth daily. 90 tablet 1   No current facility-administered  medications on file prior to visit.        ROS:  All others reviewed and negative.  Objective        PE:  BP (!) 146/80 (BP Location: Left Arm, Patient Position: Sitting, Cuff Size: Large)   Pulse 69   Ht  $'5\' 6"'j$  (1.676 m)   Wt 153 lb 9.6 oz (69.7 kg)   SpO2 97%   BMI 24.79 kg/m                 Constitutional: Pt appears in NAD               HENT: Head: NCAT.                Right Ear: External ear normal.                 Left Ear: External ear normal.  Bilat tm's with mild erythema.  Max sinus areas non tender.  Pharynx with mild erythema, no exudate               Eyes: . Pupils are equal, round, and reactive to light. Conjunctivae and EOM are normal               Nose: without d/c or deformity               Neck: Neck supple. Gross normal ROM               Cardiovascular: Normal rate and regular rhythm.                 Pulmonary/Chest: Effort normal and breath sounds without rales or wheezing.                Abd:  Soft, NT, ND, + BS, no organomegaly               Neurological: Pt is alert. At baseline orientation, motor grossly intact               Skin:  LE edema - none, post mid occiput with 1/2 cm laceration with scab, no bleeding, mild tender, no red, swelling or drainage               Psychiatric: Pt behavior is normal without agitation   Micro: none  Cardiac tracings I have personally interpreted today:  none  Pertinent Radiological findings (summarize): none   Lab Results  Component Value Date   WBC 6.8 09/03/2021   HGB 14.2 09/03/2021   HCT 43.4 09/03/2021   PLT 161 09/03/2021   GLUCOSE 104 (H) 09/03/2021   CHOL 139 06/26/2021   TRIG 101.0 06/26/2021   HDL 45.80 06/26/2021   LDLDIRECT 160.4 12/19/2008   LDLCALC 73 06/26/2021   ALT 111 (H) 09/03/2021   AST 49 (H) 09/03/2021   NA 136 09/03/2021   K 3.8 09/03/2021   CL 94 (L) 09/03/2021   CREATININE 1.06 (H) 09/03/2021   BUN 14 09/03/2021   CO2 31 09/03/2021   TSH 1.61 06/26/2021   HGBA1C 6.1 06/26/2021    Assessment/Plan:  NESA DISTEL is a 88 y.o. White or Caucasian [1] female with  has a past medical history of Adenomatous colon polyp, Allergy, Anemia, Anxiety, CAD (coronary artery disease), Cataract, Chronic lower back pain, Diverticulosis, GERD (gastroesophageal reflux disease), Headache(784.0), Hiatal hernia, Hyperlipidemia, Hypertension, IBS (irritable bowel syndrome) (10/13/2011), Osteoarthritis, Osteoporosis, Renal cysts, acquired, bilateral, SBO (small bowel obstruction) ([place]) (09/05/2015), and Venous insufficiency.  Laceration of occipital scalp Recent onset with bleeding resolved, no s/s rf infection or new neuro changes, ok for Tdap and  to f/u any worsening symptoms or concerns  Allergic rhinitis Mild to mod, for allegra 180 qd prn,,  to f/u any worsening symptoms or concerns  Essential hypertension BP Readings from Last  3 Encounters:  11/08/21 (!) 146/80  09/03/21 133/66  08/13/21 (!) 168/98   Mild uncontrolled, pt to continue medical treatment hct, losartan 50 qd, toprol xl 25 qd as declines change for now  Followup: Return if symptoms worsen or fail to improve.  Krystal Cower, MD 11/10/2021 7:25 PM [facility]

## 2021-11-10 ENCOUNTER — Encounter: Payer: Self-pay | Admitting: Internal Medicine

## 2021-11-10 NOTE — Assessment & Plan Note (Signed)
Mild to mod, for allegra 180 qd prn,,  to f/u any worsening symptoms or concerns

## 2021-11-10 NOTE — Assessment & Plan Note (Signed)
BP Readings from Last 3 Encounters:  11/08/21 (!) 146/80  09/03/21 133/66  08/13/21 (!) 168/98   Mild uncontrolled, pt to continue medical treatment hct, losartan 50 qd, toprol xl 25 qd as declines change for now

## 2021-11-10 NOTE — Assessment & Plan Note (Signed)
Recent onset with bleeding resolved, no s/s rf infection or new neuro changes, ok for Tdap and  to f/u any worsening symptoms or concerns

## 2021-12-23 ENCOUNTER — Ambulatory Visit: Payer: PPO | Admitting: Surgical

## 2021-12-24 ENCOUNTER — Other Ambulatory Visit: Payer: Self-pay | Admitting: Internal Medicine

## 2021-12-24 DIAGNOSIS — G2581 Restless legs syndrome: Secondary | ICD-10-CM

## 2021-12-24 DIAGNOSIS — F411 Generalized anxiety disorder: Secondary | ICD-10-CM

## 2021-12-25 DIAGNOSIS — H43813 Vitreous degeneration, bilateral: Secondary | ICD-10-CM | POA: Diagnosis not present

## 2021-12-25 DIAGNOSIS — H26492 Other secondary cataract, left eye: Secondary | ICD-10-CM | POA: Diagnosis not present

## 2021-12-25 DIAGNOSIS — H04123 Dry eye syndrome of bilateral lacrimal glands: Secondary | ICD-10-CM | POA: Diagnosis not present

## 2021-12-25 DIAGNOSIS — H5213 Myopia, bilateral: Secondary | ICD-10-CM | POA: Diagnosis not present

## 2021-12-25 DIAGNOSIS — H524 Presbyopia: Secondary | ICD-10-CM | POA: Diagnosis not present

## 2022-01-03 ENCOUNTER — Encounter: Payer: Self-pay | Admitting: Cardiovascular Disease

## 2022-01-03 ENCOUNTER — Ambulatory Visit: Payer: PPO | Attending: Cardiovascular Disease | Admitting: Cardiovascular Disease

## 2022-01-03 ENCOUNTER — Ambulatory Visit (INDEPENDENT_AMBULATORY_CARE_PROVIDER_SITE_OTHER): Payer: PPO

## 2022-01-03 ENCOUNTER — Other Ambulatory Visit: Payer: Self-pay | Admitting: Internal Medicine

## 2022-01-03 DIAGNOSIS — I2510 Atherosclerotic heart disease of native coronary artery without angina pectoris: Secondary | ICD-10-CM | POA: Diagnosis not present

## 2022-01-03 DIAGNOSIS — I10 Essential (primary) hypertension: Secondary | ICD-10-CM

## 2022-01-03 DIAGNOSIS — Z9861 Coronary angioplasty status: Secondary | ICD-10-CM | POA: Diagnosis not present

## 2022-01-03 DIAGNOSIS — Z23 Encounter for immunization: Secondary | ICD-10-CM | POA: Diagnosis not present

## 2022-01-03 DIAGNOSIS — I447 Left bundle-branch block, unspecified: Secondary | ICD-10-CM | POA: Diagnosis not present

## 2022-01-03 DIAGNOSIS — E785 Hyperlipidemia, unspecified: Secondary | ICD-10-CM | POA: Diagnosis not present

## 2022-01-03 NOTE — Assessment & Plan Note (Signed)
Chronic. 

## 2022-01-03 NOTE — Patient Instructions (Signed)
Medication Instructions:  Your physician recommends that you continue on your current medications as directed. Please refer to the Current Medication list given to you today.  *If you need a refill on your cardiac medications before your next appointment, please call your pharmacy*   Follow-Up: At [facility], you and your health needs are our priority.  As part of our continuing mission to provide you with exceptional heart care, we have created designated Provider Care Teams.  These Care Teams include your primary Cardiologist (physician) and Advanced Practice Providers (APPs -  Physician Assistants and Nurse Practitioners) who all work together to provide you with the care you need, when you need it.  We recommend signing up for the patient portal called "MyChart".  Sign up information is provided on this After Visit Summary.  MyChart is used to connect with patients for Virtual Visits (Telemedicine).  Patients are able to view lab/test results, encounter notes, upcoming appointments, etc.  Non-urgent messages can be sent to your provider as well.   To learn more about what you can do with MyChart, go to [email or web address].    Your next appointment:   12 month(s)  The format for your next appointment:   In Person  Provider:   Jonathan Berry, MD   

## 2022-01-03 NOTE — Assessment & Plan Note (Signed)
History of essential hypertension a blood pressure measured today at 148/72.  Her blood pressure at home was somewhat lower than this.  She is on hydrochlorothiazide, losartan and metoprolol.

## 2022-01-03 NOTE — Assessment & Plan Note (Signed)
History of hyperlipidemia on statin therapy with lipid profile performed 06/26/2021 revealing total cholesterol 139, LDL of 73 and HDL 45.

## 2022-01-03 NOTE — Assessment & Plan Note (Signed)
History of CAD status post PCI and stenting of her circumflex with a Cypher drug-eluting stent by Dr. Eustace Quail in 2003.  Her other heart arteries are normal and she had normal LV function.  She denies chest pain or shortness of breath.

## 2022-01-03 NOTE — Progress Notes (Signed)
01/03/2022 Krystal [name]   [birth year]  [id number]  Primary Physician Janith Lima, MD Primary Cardiologist: Lorretta Harp MD Lupe Carney, [place]  HPI:  Krystal [name] is a 88 y.o.  mildly overweight married Caucasian female mother of 2 children, grandmother of 3 grandchildren, whose husband [name] is also a patient of mine.  Bill unfortunately is currently being treated for bladder cancer.  They were both patients of Dr. Georgiann Mccoy. I last saw her in the office 10//22.Marland Kitchen  Her past history is remarkable for treated hypertension and hyperlipidemia. Her father did die of a myocardial infarction. She has never had a heart attack or stroke. She does not smoke. She had a Cypher drug-eluting stent placed in her circumflex coronary artery by Dr. Eustace Quail in 2003. Her other arteries were normal as was her LV function.  She did have a 2D echo performed 11/18/2012 that showed an EF in the 45% range.    Since I saw her in the office a year ago she continues to do well.  She walks 5 days a week 30 to 45 minutes at a time with her husband [name].  She is completely asymptomatic.   Current Meds  Medication Sig   acetaminophen (TYLENOL) 500 MG tablet Take 500 mg by mouth every 4 (four) hours as needed for mild pain or headache.   albuterol (VENTOLIN HFA) 108 (90 Base) MCG/ACT inhaler TAKE 2 PUFFS BY MOUTH EVERY 6 HOURS AS NEEDED FOR WHEEZE OR SHORTNESS OF BREATH   aspirin EC 81 MG tablet Take 81 mg by mouth daily. Swallow whole.   calcium carbonate (OS-CAL) 600 MG TABS tablet Take 600 mg by mouth 2 (two) times daily with a meal.   cholecalciferol (VITAMIN D) 1000 UNITS tablet Take 2,000 Units by mouth daily.    docusate sodium (COLACE) 100 MG capsule Take 100 mg by mouth daily as needed.   DULoxetine (CYMBALTA) 20 MG capsule Take 1 capsule (20 mg total) by mouth daily.   fluticasone (FLONASE) 50 MCG/ACT nasal spray SPRAY 1 SPRAY INTO BOTH NOSTRILS DAILY.   hydrochlorothiazide  (HYDRODIURIL) 25 MG tablet TAKE 1 TABLET (25 MG TOTAL) BY MOUTH DAILY.   lansoprazole (PREVACID) 15 MG capsule TAKE 1 CAPSULE (15 MG TOTAL) BY MOUTH 2 (TWO) TIMES DAILY BEFORE A MEAL. (Patient taking differently: Take 15 mg by mouth daily.)   loratadine (CLARITIN) 10 MG tablet TAKE 1 TABLET BY MOUTH EVERY DAY   LORazepam (ATIVAN) 0.5 MG tablet TAKE 1 TABLET BY MOUTH AT BEDTIME AS NEEDED FOR ANXIETY.   losartan (COZAAR) 50 MG tablet Take 1 tablet (50 mg total) by mouth 2 (two) times daily.   metoprolol succinate (TOPROL-XL) 25 MG 24 hr tablet TAKE 1 TABLET (25 MG TOTAL) BY MOUTH DAILY.   ondansetron (ZOFRAN) 4 MG tablet Take 1 tablet (4 mg total) by mouth every 6 (six) hours as needed for nausea or vomiting.   ondansetron (ZOFRAN-ODT) 4 MG disintegrating tablet Take 1 tablet (4 mg total) by mouth every 8 (eight) hours as needed for nausea or vomiting.   pregabalin (LYRICA) 25 MG capsule Take 1 capsule (25 mg total) by mouth at bedtime.   PREMARIN vaginal cream APPLY 1 APPLICATORFUL 2 3 TIMES A WEEK AS DIRECTED   Probiotic Product (ALIGN) 4 MG CAPS Take 4 mg by mouth as needed.    RESTASIS 0.05 % ophthalmic emulsion    rosuvastatin (CRESTOR) 10 MG tablet Take 1 tablet (10 mg total) by  mouth daily.     Allergies  Allergen Reactions   Atorvastatin Other (See Comments)    Muscle aches   Simvastatin Other (See Comments)    Myalgias    Alendronate Sodium     REACTION: pt states INTOL to Fosamax w/ esophagitis   Cefdinir     REACTION: diarrhea   Doxycycline     diarrhea   Esomeprazole Magnesium     REACTION: pt states INTOL to Nexium   Levofloxacin     REACTION: diarrhea   Omeprazole     REACTION: pt states INTOL to Prilosec   Other     Pneumonia vaccine---felt tired and achy and sick x 6 months to get over this.   Penicillins Hives   Amlodipine Other (See Comments)    Ankle edema   Shellfish-Derived Products Hives, Nausea Only and Rash    Social History   Socioeconomic History    Marital status: Married    Spouse name: [name]   Number of children: Not on file   Years of education: Not on file   Highest education level: Not on file  Occupational History   Occupation: retired  Tobacco Use   Smoking status: Never   Smokeless tobacco: Never  Vaping Use   Vaping Use: Never used  Substance and Sexual Activity   Alcohol use: No    Alcohol/week: 0.0 standard drinks of alcohol   Drug use: No   Sexual activity: Not on file  Other Topics Concern   Not on file  Social History Narrative   Married to [place] who has congestive heart failure problems   She is retired never smoker rare alcohol   Social Determinants of Radio broadcast assistant Strain: Low Risk  (07/29/2021)   Overall Financial Resource Strain (CARDIA)    Difficulty of Paying Living Expenses: Not hard at all  Food Insecurity: No Food Insecurity (07/29/2021)   Hunger Vital Sign    Worried About Running Out of Food in the Last Year: Never true    [place] in the Last Year: Never true  Transportation Needs: No Transportation Needs (07/29/2021)   PRAPARE - Hydrologist (Medical): No    Lack of Transportation (Non-Medical): No  Physical Activity: Sufficiently Active (07/29/2021)   Exercise Vital Sign    Days of Exercise per Week: 5 days    Minutes of Exercise per Session: 30 min  Stress: No Stress Concern Present (07/29/2021)   [facility]    Feeling of Stress : Not at all  Social Connections: Moderately Isolated (07/29/2021)   Social Connection and Isolation Panel [NHANES]    Frequency of Communication with Friends and Family: More than three times a week    Frequency of Social Gatherings with Friends and Family: More than three times a week    Attends Religious Services: Never    Marine scientist or Organizations: No    Attends Archivist Meetings: Never    Marital Status: Married  Arboriculturist Violence: Not At Risk (07/29/2021)   Humiliation, Afraid, Rape, and Kick questionnaire    Fear of Current or Ex-Partner: No    Emotionally Abused: No    Physically Abused: No    Sexually Abused: No     Review of Systems: General: negative for chills, fever, night sweats or weight changes.  Cardiovascular: negative for chest pain, dyspnea on exertion, edema, orthopnea, palpitations, paroxysmal nocturnal dyspnea  or shortness of breath Dermatological: negative for rash Respiratory: negative for cough or wheezing Urologic: negative for hematuria Abdominal: negative for nausea, vomiting, diarrhea, bright red blood per rectum, melena, or hematemesis Neurologic: negative for visual changes, syncope, or dizziness All other systems reviewed and are otherwise negative except as noted above.    Blood pressure (!) 148/72, pulse 66, height '5\' 5"'$  (1.651 m), weight 151 lb 12.8 oz (68.9 kg), SpO2 97 %.  General appearance: alert and no distress Neck: no adenopathy, no carotid bruit, no JVD, supple, symmetrical, trachea midline, and thyroid not enlarged, symmetric, no tenderness/mass/nodules Lungs: clear to auscultation bilaterally Heart: regular rate and rhythm, S1, S2 normal, no murmur, click, rub or gallop Extremities: extremities normal, atraumatic, no cyanosis or edema Pulses: 2+ and symmetric Skin: Skin color, texture, turgor normal. No rashes or lesions Neurologic: Grossly normal  EKG not performed today  ASSESSMENT AND PLAN:   Hyperlipidemia with target LDL less than 70 History of hyperlipidemia on statin therapy with lipid profile performed 06/26/2021 revealing total cholesterol 139, LDL of 73 and HDL 45.  Essential hypertension History of essential hypertension a blood pressure measured today at 148/72.  Her blood pressure at home was somewhat lower than this.  She is on hydrochlorothiazide, losartan and metoprolol.  CAD S/P percutaneous coronary angioplasty History of CAD  status post PCI and stenting of her circumflex with a Cypher drug-eluting stent by Dr. Eustace Quail in 2003.  Her other heart arteries are normal and she had normal LV function.  She denies chest pain or shortness of breath.  LBBB (left bundle branch block) Chronic     Lorretta Harp MD [facility], [facility] 01/03/2022 10:54 AM

## 2022-01-06 ENCOUNTER — Ambulatory Visit: Payer: PPO | Admitting: Family Medicine

## 2022-01-07 ENCOUNTER — Ambulatory Visit: Payer: Self-pay

## 2022-01-07 ENCOUNTER — Ambulatory Visit: Payer: PPO | Admitting: Family Medicine

## 2022-01-07 ENCOUNTER — Ambulatory Visit (INDEPENDENT_AMBULATORY_CARE_PROVIDER_SITE_OTHER): Payer: PPO

## 2022-01-07 VITALS — BP 158/78 | HR 65 | Ht 65.0 in | Wt 153.0 lb

## 2022-01-07 DIAGNOSIS — G8929 Other chronic pain: Secondary | ICD-10-CM

## 2022-01-07 DIAGNOSIS — M79671 Pain in right foot: Secondary | ICD-10-CM

## 2022-01-07 DIAGNOSIS — M1811 Unilateral primary osteoarthritis of first carpometacarpal joint, right hand: Secondary | ICD-10-CM | POA: Diagnosis not present

## 2022-01-07 NOTE — Patient Instructions (Addendum)
Thank you for coming in today.   You received an injection today. Seek immediate medical attention if the joint becomes red, extremely painful, or is oozing fluid.   Please get an Xray today before you leave   Recheck in 1 month.

## 2022-01-07 NOTE — Progress Notes (Signed)
I, Peterson Lombard, LAT, ATC acting as a scribe for Lynne Leader, MD.  Subjective:    CC: Right foot pain  HPI: Patient is an 88 year old female complaining of right foot pain intermittently for about a year. Pt enjoys walking and doesn't want to have to quit. Pt note she has a bunion on the R Great toe and hx of neuropathy. Patient locates pain to all over R forefoot and into toes.  She saw podiatry last year who diagnosed her with midfoot arthritis bunion and neuropathy and treated her with custom orthotics which seem to help.  She continues to orthotics which she does find helpful.  She could not tolerate the gabapentin as it caused sedation and fatigue and imbalance.  Swelling: no Aggravates: walking Treatments tried: Voltaren gel  Dx imaging: 03/26/2021 R foot x-ray  Pertinent review of Systems: No fevers or chills  Relevant historical information: CAD   Objective:    Vitals:   01/07/22 1050  BP: (!) 158/78  Pulse: 65  SpO2: 97%   General: Well Developed, well nourished, and in no acute distress.   MSK: Right foot significant bunion formation.  Tender palpation along proximal second metatarsal shaft at the tarsometatarsal joint.  Normal foot and ankle motion.  Pulses cap refill and sensation are intact distally.  Lab and Radiology Results  Procedure: Real-time Ultrasound Guided Injection of right second TMT Device: Philips Affiniti 50G Images permanently stored and available for review in PACS Verbal informed consent obtained.  Discussed risks and benefits of procedure. Warned about infection, bleeding, hyperglycemia damage to structures among others. Patient expresses understanding and agreement Time-out conducted.   Noted no overlying erythema, induration, or other signs of local infection.   Skin prepped in a sterile fashion.   Local anesthesia: Topical Ethyl chloride.   With sterile technique and under real time ultrasound guidance: 20 mg of Kenalog and 0.5 mL  of lidocaine injected into second tarsometatarsal joint. Fluid seen entering the joint capsule.   Completed without difficulty   Pain immediately resolved suggesting accurate placement of the medication.   Advised to call if fevers/chills, erythema, induration, drainage, or persistent bleeding.   Images permanently stored and available for review in the ultrasound unit.  Impression: Technically successful ultrasound guided injection.   X-ray images right foot obtained today personally and independently interpreted Degenerative changes about the Lisfranc joint and at the second tarsometatarsal joint. Bunion formation is present Await formal radiology review     Impression and Recommendations:    Assessment and Plan: 88 y.o. female with right midfoot pain thought to be degenerative changes around the dorsal midfoot especially tarsometatarsal joint.  Plan for injection today continue orthotics.  Recheck in 1 month.Marland Kitchen  PDMP not reviewed this encounter. Orders Placed This Encounter  Procedures   [place] LIMITED JOINT SPACE STRUCTURES LOW RIGHT(NO LINKED CHARGES)    Order Specific Question:   Reason for Exam (SYMPTOM  OR DIAGNOSIS REQUIRED)    Answer:   right foot pain    Order Specific Question:   Preferred imaging location?    Answer:   [facility]   DG Foot Complete Right    Standing Status:   Future    Number of Occurrences:   1    Standing Expiration Date:   01/08/2023    Order Specific Question:   Reason for Exam (SYMPTOM  OR DIAGNOSIS REQUIRED)    Answer:   eval foot pain    Order Specific Question:   Preferred  imaging location?    Answer:   [facility]   No orders of the defined types were placed in this encounter.   Discussed warning signs or symptoms. Please see discharge instructions. Patient expresses understanding.   The above documentation has been reviewed and is accurate and complete Lynne Leader, M.D.

## 2022-01-09 DIAGNOSIS — J029 Acute pharyngitis, unspecified: Secondary | ICD-10-CM | POA: Diagnosis not present

## 2022-01-09 DIAGNOSIS — Z20822 Contact with and (suspected) exposure to covid-19: Secondary | ICD-10-CM | POA: Diagnosis not present

## 2022-01-09 DIAGNOSIS — R051 Acute cough: Secondary | ICD-10-CM | POA: Diagnosis not present

## 2022-01-09 NOTE — Progress Notes (Signed)
Right foot x-ray shows a bunion.  Additionally there is some arthritis changes in the midfoot.

## 2022-01-18 DIAGNOSIS — M542 Cervicalgia: Secondary | ICD-10-CM | POA: Diagnosis not present

## 2022-01-18 DIAGNOSIS — M62838 Other muscle spasm: Secondary | ICD-10-CM | POA: Diagnosis not present

## 2022-01-18 DIAGNOSIS — M549 Dorsalgia, unspecified: Secondary | ICD-10-CM | POA: Diagnosis not present

## 2022-01-21 ENCOUNTER — Ambulatory Visit: Payer: Self-pay

## 2022-01-21 ENCOUNTER — Ambulatory Visit: Payer: PPO | Admitting: Family Medicine

## 2022-01-21 VITALS — BP 178/90 | HR 65 | Ht 65.0 in | Wt 152.0 lb

## 2022-01-21 DIAGNOSIS — M79601 Pain in right arm: Secondary | ICD-10-CM

## 2022-01-21 DIAGNOSIS — S29012A Strain of muscle and tendon of back wall of thorax, initial encounter: Secondary | ICD-10-CM

## 2022-01-21 MED ORDER — TIZANIDINE HCL 2 MG PO TABS: 2.0000 mg | ORAL_TABLET | Freq: Three times a day (TID) | ORAL | 1 refills | Status: DC | PRN

## 2022-01-21 NOTE — Progress Notes (Signed)
   I, Peterson Lombard, LAT, ATC acting as a scribe for Lynne Leader, MD.  Krystal [name] is a 88 y.o. female who presents to [place] at [facility] today for right shoulder/arm pain.  Patient was last seen by Dr. Georgina Snell on 01/07/2022 for chronic right foot pain.  Today, pt had picked up a heavy object on Monday, 10/16. Pt locates pain to around her R scapula. Pt was seen at [facility] on 10/22.  Neck pain: no Radiates: yes-through shoulder and upper arm UE Numbness/tingling: yes UE Weakness: no Aggravates: nothing in particular Treatments tried: IBU, sling, "pain shot and steroid"- given at [facility]  Dx imaging: 12/11/17 R shoulder XR  Pertinent review of systems: No fevers or chills  Relevant historical information: Hypertension   Exam:  BP (!) 178/90   Pulse 65   Ht '5\' 5"'$  (1.651 m)   Wt 152 lb (68.9 kg)   SpO2 98%   BMI 25.29 kg/m  General: Well Developed, well nourished, and in no acute distress.   MSK: Right shoulder: Thoracic kyphosis is present otherwise right shoulder is normal-appearing. Normal shoulder motion.  Some pain with scapular retraction present. Tender palpation rhomboid region. Strength is intact.  Pulses cap refill and sensation are intact distally.  C-spine: Normal. Nontender midline.  Decreased cervical motion.  Negative Spurling's test. Upper extremity strength is intact.  Reflexes are intact.     Assessment and Plan: 88 y.o. female with right rhomboid pain.  Pain thought to be due to strain of rhomboid after lifting a heavy pack of water.  Plan for physical therapy referral.  Recommend heating pad as well.  Will prescribe low-dose tizanidine for use at bedtime as needed.  Cautioned against sedation and side effects of this medication.    PDMP not reviewed this encounter. Orders Placed This Encounter  Procedures   Ambulatory referral to Physical Therapy    Referral Priority:   Routine    Referral Type:   Physical Medicine    Referral  Reason:   Specialty Services Required    Requested Specialty:   Physical Therapy    Number of Visits Requested:   1   Meds ordered this encounter  Medications   tiZANidine (ZANAFLEX) 2 MG tablet    Sig: Take 1 tablet (2 mg total) by mouth every 8 (eight) hours as needed for muscle spasms. Take mostly at bedtime    Dispense:  60 tablet    Refill:  1     Discussed warning signs or symptoms. Please see discharge instructions. Patient expresses understanding.   The above documentation has been reviewed and is accurate and complete Lynne Leader, M.D.

## 2022-01-21 NOTE — Patient Instructions (Addendum)
Thank you for coming in today.   I've referred you to Physical Therapy.  Let us know if you don't hear from them in one week.   Use the heating pad.   Use the muscle relaxer mostly at bedtime as needed for muscle spasm.   Recheck if not getting better.

## 2022-01-22 ENCOUNTER — Encounter: Payer: Self-pay | Admitting: Physical Therapy

## 2022-01-22 ENCOUNTER — Ambulatory Visit: Payer: PPO | Attending: Family Medicine | Admitting: Physical Therapy

## 2022-01-22 ENCOUNTER — Other Ambulatory Visit: Payer: Self-pay

## 2022-01-22 DIAGNOSIS — M546 Pain in thoracic spine: Secondary | ICD-10-CM

## 2022-01-22 DIAGNOSIS — S29012A Strain of muscle and tendon of back wall of thorax, initial encounter: Secondary | ICD-10-CM | POA: Insufficient documentation

## 2022-01-22 DIAGNOSIS — M6281 Muscle weakness (generalized): Secondary | ICD-10-CM

## 2022-01-22 DIAGNOSIS — R293 Abnormal posture: Secondary | ICD-10-CM

## 2022-01-22 NOTE — Therapy (Addendum)
OUTPATIENT PHYSICAL THERAPY CERVICAL EVALUATION   Patient Name: Krystal [name] MRN: [id number] DOB:[birth year], 88 y.o., female Today's Date: 01/22/2022   PT End of Session - 01/22/22 1011     Visit Number 1    Date for PT Re-Evaluation 03/05/22    Authorization Type [employer]    Progress Note Due on Visit 10    PT Start Time 0930    PT Stop Time 1005    PT Time Calculation (min) 35 min    Activity Tolerance Patient tolerated treatment well    Behavior During Therapy WFL for tasks assessed/performed             Past Medical History:  Diagnosis Date   Adenomatous colon polyp    Allergy    seasonal   Anemia    pt. denies   Anxiety    CAD (coronary artery disease)    Cataract    cataracts removed left eye.   Chronic lower back pain    Diverticulosis    GERD (gastroesophageal reflux disease)    Headache(784.0)    Hiatal hernia    Hyperlipidemia    Hypertension    IBS (irritable bowel syndrome) 10/13/2011   Osteoarthritis    Osteoporosis    Renal cysts, acquired, bilateral    SBO (small bowel obstruction) ([place]) 09/05/2015   Venous insufficiency    Past Surgical History:  Procedure Laterality Date   ABDOMINAL HYSTERECTOMY     BLADDER REPAIR     CARDIAC CATHETERIZATION  01/12/2002   90% stenosis of the left circumflex, stented with a 3x66m Cypher stent, resulting in reduction of 90% to 10%    CARDIOVASCULAR STRESS TEST  03/22/2007   Mild inferolateral thinning toward the apex without significant ischemia. Nondiagnostic electrocardiogram.   CATARACT EXTRACTION     bilateral   COLONOSCOPY  08/17/2008   adenomatous polyp, diverticulosis, external hemorrhoids   COLONOSCOPY W/ BIOPSIES     multiple    LEFT LOWER EXTREMITY VENOUS DOPPLER  06/18/2011   No evidence of left lower extremity DVT   TRANSTHORACIC ECHOCARDIOGRAM  11/18/2012   EF 463-81% LV systolic mild-moderately reduced, mild-moderate mitral valve regurg, mild-moderate tricuspid valve regurg.  NSR-LBBB with occas PVCs   UPPER GASTROINTESTINAL ENDOSCOPY  07/03/2010   hiatal hernia   VARICOSE VEIN SURGERY     Patient Active Problem List   Diagnosis Date Noted   Laceration of occipital scalp 11/08/2021   Encounter for general adult medical examination with abnormal findings 05/08/2020   Primary osteoarthritis of first carpometacarpal joint of right hand 05/08/2020   Restless legs syndrome (RLS) 08/22/2019   Chronic peripheral neuropathic pain 08/22/2019   Chronic renal disease, stage 3, moderately decreased glomerular filtration rate (GFR) between 30-59 mL/min/1.73 square meter (HCC) 11/08/2018   Chronic idiopathic constipation 10/19/2017   LBBB (left bundle branch block) 07/16/2016   Cardiomyopathy ([place] 07/16/2016   Benign paroxysmal positional vertigo 01/17/2016   Prediabetes 05/23/2015   IBS (irritable bowel syndrome) 10/13/2011   GERD (gastroesophageal reflux disease) 06/25/2010   RENAL CYST 11/18/2007   OSTEOPOROSIS 11/18/2007   Allergic rhinitis 10/06/2007   Hyperlipidemia with target LDL less than 70 07/19/2007   GAD (generalized anxiety disorder) 07/19/2007   Essential hypertension 07/19/2007   CAD S/P percutaneous coronary angioplasty 07/19/2007   VENOUS INSUFFICIENCY 07/19/2007   Osteoarthritis 07/19/2007   LOW BACK PAIN SYNDROME 07/19/2007    PCP: TJanith Lima MD  REFERRING PROVIDER: CGregor Hams MD   REFERRING DIAG: [phone](ICD-10-CM) - Strain  of rhomboid muscle, initial encounter   THERAPY DIAG:  Pain in thoracic spine  Abnormal posture  Muscle weakness (generalized)  Rationale for Evaluation and Treatment Rehabilitation  ONSET DATE: 01/13/22  SUBJECTIVE:                                                                                                                                                                                                         SUBJECTIVE STATEMENT: Pt referred to [facility] for Rt intrascapular pain which started after  picking up a heavy case of water on 01/13/22.  Went to urgent care after a few days of pain and got a shot and has one more day of prednisone to take.  Symptoms are improving over time with prednisone, heat, and ibuprofen 2x/day.  She has started to sleep without sleep disruption.  PERTINENT HISTORY:  osteoporosis  PAIN:  PAIN:  Are you having pain? Yes NPRS scale: 5/10 Pain location: Rt intrascapular Pain orientation: Right  PAIN TYPE: aching and dull Pain description: constant and dull  Aggravating factors: constant toothache Relieving factors: meds, heat   PRECAUTIONS: None  WEIGHT BEARING RESTRICTIONS: No  FALLS:  Has patient fallen in last 6 months? No  LIVING ENVIRONMENT: Lives with: lives with their spouse Lives in: House/apartment Stairs: No Has following equipment at home: None  OCCUPATION: retired  PLOF: Independent  PATIENT GOALS: pain relief  OBJECTIVE:   DIAGNOSTIC FINDINGS:  none  PATIENT SURVEYS:  FOTO 61% goal 70%  COGNITION: Overall cognitive status: Within functional limits for tasks assessed  SENSATION: WFL  POSTURE: forward head and increased thoracic kyphosis  PALPATION: Rhomboid on Rt, spasm present, tender to moderate pressure, slight tenderness in Rt levator   CERVICAL ROM:   Active ROM A/PROM (deg) eval  Flexion 45  Extension 40  Right lateral flexion 40  Left lateral flexion 40  Right rotation 60  Left rotation 60   (Blank rows = not tested)  UPPER EXTREMITY ROM: WFL bil without pain  UPPER EXTREMITY MMT: 4/5 grossly bil UE without pain on testing  CERVICAL SPECIAL TESTS:    FUNCTIONAL TESTS:     TODAY'S TREATMENT:  DATE: 01/22/22 (EVAL):  Issued HEP, advised continued use of heat  PATIENT EDUCATION:  Education details: Access Code: [email or web address] Person educated: Patient Education method:  Explanation, Demonstration, Verbal cues, and Handouts Education comprehension: verbalized understanding and returned demonstration  HOME EXERCISE PROGRAM: Access Code: [email or web address] URL: [email or web address] Date: 01/22/2022 Prepared by: Venetia Night Jahaan Vanwagner  Exercises - Baby Eagle   - 3 x daily - 7 x weekly - 1 sets - 1 reps - 20 sec hold - Gentle Levator Scapulae Stretch  - 3 x daily - 7 x weekly - 1 sets - 1 reps - 20 sec hold - Seated Scapular Retraction  - 3 x daily - 7 x weekly - 1 sets - 10 reps - Seated Shoulder Shrug Circles AROM Backward  - 3 x daily - 7 x weekly - 1 sets - 10 reps  ASSESSMENT:  CLINICAL IMPRESSION: Patient is a 88 y.o. female who was seen today for physical therapy evaluation and treatment for Rt intrascapular pain which started mid-Oct after picking up a heavy case of water.  Symptoms are improving over time.  Pt ROM WFL for neck and bil UE without pain.  Pt able to perform MMT for bil UE without pain, 4/5 grossly.  Pt now able to tolerate moderate pressure palpation over Rt rhomboid which appears to be pain source.  HEP issued for stretching and gentle mobility which was well tolerated during evaluation.  Pt will benefit from skilled PT to progress healing and recovery and maximize return to daily activities.  OBJECTIVE IMPAIRMENTS: decreased activity tolerance, decreased mobility, decreased ROM, decreased strength, increased fascial restrictions, increased muscle spasms, impaired flexibility, impaired UE functional use, improper body mechanics, postural dysfunction, and pain.   ACTIVITY LIMITATIONS: carrying, lifting, sleeping, bed mobility, bathing, dressing, and reach over head  PARTICIPATION LIMITATIONS: meal prep, cleaning, laundry, driving, shopping, community activity, and yard work  PERSONAL FACTORS: Age are also affecting patient's functional outcome.   REHAB POTENTIAL: Good  CLINICAL DECISION MAKING: Stable/uncomplicated  EVALUATION  COMPLEXITY: Low   GOALS: Goals reviewed with patient? Yes  SHORT TERM GOALS: Target date: 02/19/2022   Pt will be ind with initial HEP without exacerbation of symptoms. Baseline:  Goal status: INITIAL  2.  Pt will be able to perform light lifting with good body mechanics without pain Baseline:  Goal status: INITIAL  3.  Pt will report pain reduction to no greater than 3/10 with daily activities. Baseline:  Goal status: INITIAL    LONG TERM GOALS: Target date: 03/19/22  Pt will be ind with advanced HEP and understand prevention of future injury with daily tasks. Baseline:  Goal status: INITIAL  2.  FOTO score will improve to at least 70% to demo improved function. Baseline: 61% Goal status: INITIAL  3.  Pt will report pain no greater than 2/10 with daily activities. Baseline:  Goal status: INITIAL     PLAN:  PT FREQUENCY: 1x/week  PT DURATION: 6 weeks  PLANNED INTERVENTIONS: Therapeutic exercises, Therapeutic activity, Neuromuscular re-education, Patient/Family education, Self Care, Joint mobilization, Dry Needling, Electrical stimulation, Spinal mobilization, Moist heat, Ionotophoresis 53m/ml Dexamethasone, and Manual therapy  PLAN FOR NEXT SESSION: review HEP and incorporate light resistance bands for row, ext, ER, horiz abd as tol, STM Rt rhomboid, moist heat, body mechanics for lifting/carrying  Krystal [name], PT 01/22/22 10:12 AM   PHYSICAL THERAPY DISCHARGE SUMMARY  Visits from Start of Care: 1  Current functional level related to goals / functional outcomes: Pt called to cancel remaining visits due  to resolved pain.   Remaining deficits: See above.     Education / Equipment: Initial HEP provided at initial visit   Patient agrees to discharge. Patient goals were not met. Patient is being discharged due to not returning since the last visit.  Krystal [name], PT 03/06/22 1:56 PM

## 2022-01-23 ENCOUNTER — Other Ambulatory Visit: Payer: Self-pay | Admitting: Internal Medicine

## 2022-01-23 DIAGNOSIS — J301 Allergic rhinitis due to pollen: Secondary | ICD-10-CM

## 2022-01-24 ENCOUNTER — Telehealth: Payer: Self-pay | Admitting: Family Medicine

## 2022-01-24 MED ORDER — HYDROCODONE-ACETAMINOPHEN 5-325 MG PO TABS: 1.0000 | ORAL_TABLET | Freq: Four times a day (QID) | ORAL | 0 refills | Status: DC | PRN

## 2022-01-24 NOTE — Telephone Encounter (Signed)
Hydrocodone prescribed

## 2022-01-24 NOTE — Telephone Encounter (Signed)
I called the pharmacy and confirmed that they received the 2nd attempt at the Rx.  I then called Stanton Kidney and let her know that the pharmacy has the prescription.

## 2022-01-24 NOTE — Telephone Encounter (Signed)
Patient called and informed this has been sent in

## 2022-01-24 NOTE — Addendum Note (Signed)
Addended by: Gregor Hams on: 01/24/2022 05:05 PM   Modules accepted: Orders

## 2022-01-24 NOTE — Telephone Encounter (Signed)
Patient called stating that she is having a lot of shoulder pain. She has tried increasing her Advil and doing the exercises but nothing seems to be helping. She asked if [name] would be able to send her something in for pain?   Pharmacy: [facility]

## 2022-01-25 DIAGNOSIS — S4991XA Unspecified injury of right shoulder and upper arm, initial encounter: Secondary | ICD-10-CM | POA: Diagnosis not present

## 2022-01-25 DIAGNOSIS — S46811A Strain of other muscles, fascia and tendons at shoulder and upper arm level, right arm, initial encounter: Secondary | ICD-10-CM | POA: Diagnosis not present

## 2022-01-25 DIAGNOSIS — S42121A Displaced fracture of acromial process, right shoulder, initial encounter for closed fracture: Secondary | ICD-10-CM | POA: Diagnosis not present

## 2022-01-26 ENCOUNTER — Other Ambulatory Visit: Payer: Self-pay

## 2022-01-26 ENCOUNTER — Emergency Department (HOSPITAL_BASED_OUTPATIENT_CLINIC_OR_DEPARTMENT_OTHER): Payer: PPO | Admitting: Radiology

## 2022-01-26 ENCOUNTER — Emergency Department (HOSPITAL_BASED_OUTPATIENT_CLINIC_OR_DEPARTMENT_OTHER)
Admission: EM | Admit: 2022-01-26 | Discharge: 2022-01-26 | Discharge status: Against Medical Advice | Payer: PPO | Source: Home / Self Care

## 2022-01-26 ENCOUNTER — Encounter (HOSPITAL_BASED_OUTPATIENT_CLINIC_OR_DEPARTMENT_OTHER): Payer: Self-pay

## 2022-01-26 ENCOUNTER — Inpatient Hospital Stay (HOSPITAL_BASED_OUTPATIENT_CLINIC_OR_DEPARTMENT_OTHER)
Admission: EM | Admit: 2022-01-26 | Discharge: 2022-01-29 | DRG: 690 | Disposition: A | Discharge status: Home / Self Care | Payer: PPO | Attending: Internal Medicine | Admitting: Internal Medicine

## 2022-01-26 ENCOUNTER — Encounter (HOSPITAL_COMMUNITY): Payer: Self-pay

## 2022-01-26 DIAGNOSIS — I10 Essential (primary) hypertension: Secondary | ICD-10-CM | POA: Diagnosis present

## 2022-01-26 DIAGNOSIS — E785 Hyperlipidemia, unspecified: Secondary | ICD-10-CM | POA: Diagnosis present

## 2022-01-26 DIAGNOSIS — R059 Cough, unspecified: Secondary | ICD-10-CM

## 2022-01-26 DIAGNOSIS — Z823 Family history of stroke: Secondary | ICD-10-CM

## 2022-01-26 DIAGNOSIS — Z91013 Allergy to seafood: Secondary | ICD-10-CM

## 2022-01-26 DIAGNOSIS — Z90710 Acquired absence of both cervix and uterus: Secondary | ICD-10-CM

## 2022-01-26 DIAGNOSIS — T502X5A Adverse effect of carbonic-anhydrase inhibitors, benzothiadiazides and other diuretics, initial encounter: Secondary | ICD-10-CM | POA: Diagnosis not present

## 2022-01-26 DIAGNOSIS — R351 Nocturia: Secondary | ICD-10-CM | POA: Diagnosis not present

## 2022-01-26 DIAGNOSIS — J9811 Atelectasis: Secondary | ICD-10-CM | POA: Diagnosis not present

## 2022-01-26 DIAGNOSIS — E871 Hypo-osmolality and hyponatremia: Secondary | ICD-10-CM | POA: Diagnosis present

## 2022-01-26 DIAGNOSIS — Z955 Presence of coronary angioplasty implant and graft: Secondary | ICD-10-CM

## 2022-01-26 DIAGNOSIS — N390 Urinary tract infection, site not specified: Principal | ICD-10-CM | POA: Diagnosis present

## 2022-01-26 DIAGNOSIS — Z880 Allergy status to penicillin: Secondary | ICD-10-CM | POA: Diagnosis not present

## 2022-01-26 DIAGNOSIS — F39 Unspecified mood [affective] disorder: Secondary | ICD-10-CM | POA: Diagnosis not present

## 2022-01-26 DIAGNOSIS — Z20822 Contact with and (suspected) exposure to covid-19: Secondary | ICD-10-CM | POA: Diagnosis not present

## 2022-01-26 DIAGNOSIS — Z7982 Long term (current) use of aspirin: Secondary | ICD-10-CM | POA: Diagnosis not present

## 2022-01-26 DIAGNOSIS — F411 Generalized anxiety disorder: Secondary | ICD-10-CM | POA: Diagnosis not present

## 2022-01-26 DIAGNOSIS — R110 Nausea: Secondary | ICD-10-CM

## 2022-01-26 DIAGNOSIS — I110 Hypertensive heart disease with heart failure: Secondary | ICD-10-CM | POA: Diagnosis not present

## 2022-01-26 DIAGNOSIS — K219 Gastro-esophageal reflux disease without esophagitis: Secondary | ICD-10-CM | POA: Diagnosis not present

## 2022-01-26 DIAGNOSIS — Z79899 Other long term (current) drug therapy: Secondary | ICD-10-CM

## 2022-01-26 DIAGNOSIS — R319 Hematuria, unspecified: Secondary | ICD-10-CM | POA: Diagnosis not present

## 2022-01-26 DIAGNOSIS — Z881 Allergy status to other antibiotic agents status: Secondary | ICD-10-CM | POA: Diagnosis not present

## 2022-01-26 DIAGNOSIS — R112 Nausea with vomiting, unspecified: Secondary | ICD-10-CM | POA: Diagnosis not present

## 2022-01-26 DIAGNOSIS — I5022 Chronic systolic (congestive) heart failure: Secondary | ICD-10-CM | POA: Diagnosis not present

## 2022-01-26 DIAGNOSIS — I2510 Atherosclerotic heart disease of native coronary artery without angina pectoris: Secondary | ICD-10-CM | POA: Diagnosis present

## 2022-01-26 DIAGNOSIS — Z8249 Family history of ischemic heart disease and other diseases of the circulatory system: Secondary | ICD-10-CM

## 2022-01-26 DIAGNOSIS — Z9861 Coronary angioplasty status: Secondary | ICD-10-CM | POA: Diagnosis not present

## 2022-01-26 LAB — URINALYSIS, ROUTINE W REFLEX MICROSCOPIC
Bilirubin Urine: NEGATIVE
Glucose, UA: NEGATIVE mg/dL
Hgb urine dipstick: NEGATIVE
Ketones, ur: NEGATIVE mg/dL
Nitrite: POSITIVE — AB
Protein, ur: NEGATIVE mg/dL
Specific Gravity, Urine: 1.024 (ref 1.005–1.030)
pH: 6.5 (ref 5.0–8.0)

## 2022-01-26 LAB — COMPREHENSIVE METABOLIC PANEL
ALT: 14 U/L (ref 0–44)
AST: 17 U/L (ref 15–41)
Albumin: 4 g/dL (ref 3.5–5.0)
Alkaline Phosphatase: 77 U/L (ref 38–126)
Anion gap: 7 (ref 5–15)
BUN: 20 mg/dL (ref 8–23)
CO2: 26 mmol/L (ref 22–32)
Calcium: 8.9 mg/dL (ref 8.9–10.3)
Chloride: 90 mmol/L — ABNORMAL LOW (ref 98–111)
Creatinine, Ser: 0.88 mg/dL (ref 0.44–1.00)
GFR, Estimated: >60 mL/min (ref >60)
Glucose, Bld: 112 mg/dL — ABNORMAL HIGH (ref 70–99)
Potassium: 3.9 mmol/L (ref 3.5–5.1)
Sodium: 123 mmol/L — ABNORMAL LOW (ref 135–145)
Total Bilirubin: 0.5 mg/dL (ref 0.3–1.2)
Total Protein: 6.5 g/dL (ref 6.5–8.1)

## 2022-01-26 LAB — CBC
HCT: 40.8 % (ref 36.0–46.0)
Hemoglobin: 13.9 g/dL (ref 12.0–15.0)
MCH: 31.2 pg (ref 26.0–34.0)
MCHC: 34.1 g/dL (ref 30.0–36.0)
MCV: 91.5 fL (ref 80.0–100.0)
Platelets: 192 10*3/uL (ref 150–400)
RBC: 4.46 MIL/uL (ref 3.87–5.11)
RDW: 12.9 % (ref 11.5–15.5)
WBC: 10.8 10*3/uL — ABNORMAL HIGH (ref 4.0–10.5)
nRBC: 0 % (ref 0.0–0.2)

## 2022-01-26 LAB — RESP PANEL BY RT-PCR (FLU A&B, COVID) ARPGX2
Influenza A by PCR: NEGATIVE
Influenza B by PCR: NEGATIVE
SARS Coronavirus 2 by RT PCR: NEGATIVE

## 2022-01-26 LAB — LIPASE, BLOOD: Lipase: 17 U/L (ref 11–51)

## 2022-01-26 LAB — TSH: TSH: 2.26 u[IU]/mL (ref 0.350–4.500)

## 2022-01-26 MED ORDER — PREGABALIN 25 MG PO CAPS
25.0000 mg | ORAL_CAPSULE | Freq: Every day | ORAL | Status: DC
  Administered 2022-01-26 – 2022-01-28 (×3): 25 mg via ORAL
  Filled 2022-01-26 (×3): qty 1

## 2022-01-26 MED ORDER — ASPIRIN 81 MG PO TBEC
81.0000 mg | DELAYED_RELEASE_TABLET | Freq: Every day | ORAL | Status: DC
  Administered 2022-01-26 – 2022-01-29 (×4): 81 mg via ORAL
  Filled 2022-01-26 (×4): qty 1

## 2022-01-26 MED ORDER — SODIUM CHLORIDE 0.9 % IV SOLN
1.0000 g | Freq: Once | INTRAVENOUS | Status: AC
  Administered 2022-01-26: 1 g via INTRAVENOUS
  Filled 2022-01-26: qty 10

## 2022-01-26 MED ORDER — ONDANSETRON 4 MG PO TBDP
ORAL_TABLET | ORAL | Status: AC
  Filled 2022-01-26: qty 1

## 2022-01-26 MED ORDER — SODIUM CHLORIDE 0.9 % IV SOLN
12.5000 mg | Freq: Four times a day (QID) | INTRAVENOUS | Status: DC | PRN
  Filled 2022-01-26: qty 0.5

## 2022-01-26 MED ORDER — ALBUTEROL SULFATE (2.5 MG/3ML) 0.083% IN NEBU: 2.5000 mg | INHALATION_SOLUTION | Freq: Four times a day (QID) | RESPIRATORY_TRACT | Status: DC | PRN

## 2022-01-26 MED ORDER — ONDANSETRON 4 MG PO TBDP
4.0000 mg | ORAL_TABLET | Freq: Once | ORAL | Status: AC | PRN
  Administered 2022-01-26: 4 mg via ORAL

## 2022-01-26 MED ORDER — SODIUM CHLORIDE 0.9 % IV SOLN: INTRAVENOUS | Status: AC

## 2022-01-26 MED ORDER — SODIUM CHLORIDE 0.9 % IV BOLUS
1000.0000 mL | Freq: Once | INTRAVENOUS | Status: AC
  Administered 2022-01-26: 1000 mL via INTRAVENOUS

## 2022-01-26 MED ORDER — METOPROLOL SUCCINATE ER 25 MG PO TB24
25.0000 mg | ORAL_TABLET | Freq: Every day | ORAL | Status: DC
  Administered 2022-01-27 – 2022-01-29 (×3): 25 mg via ORAL
  Filled 2022-01-26 (×3): qty 1

## 2022-01-26 MED ORDER — LORAZEPAM 0.5 MG PO TABS
0.5000 mg | ORAL_TABLET | Freq: Every evening | ORAL | Status: DC | PRN
  Administered 2022-01-27 – 2022-01-28 (×2): 0.5 mg via ORAL
  Filled 2022-01-26 (×2): qty 1

## 2022-01-26 MED ORDER — LOSARTAN POTASSIUM 25 MG PO TABS
50.0000 mg | ORAL_TABLET | Freq: Once | ORAL | Status: AC
  Administered 2022-01-26: 50 mg via ORAL
  Filled 2022-01-26: qty 2

## 2022-01-26 MED ORDER — ALBUTEROL SULFATE HFA 108 (90 BASE) MCG/ACT IN AERS: 2.0000 | INHALATION_SPRAY | Freq: Four times a day (QID) | RESPIRATORY_TRACT | Status: DC | PRN

## 2022-01-26 MED ORDER — DULOXETINE HCL 20 MG PO CPEP
20.0000 mg | ORAL_CAPSULE | Freq: Every day | ORAL | Status: DC
  Administered 2022-01-27 – 2022-01-29 (×3): 20 mg via ORAL
  Filled 2022-01-26 (×4): qty 1

## 2022-01-26 MED ORDER — ACETAMINOPHEN 500 MG PO TABS: 500.0000 mg | ORAL_TABLET | ORAL | Status: DC | PRN

## 2022-01-26 MED ORDER — LOSARTAN POTASSIUM 50 MG PO TABS
50.0000 mg | ORAL_TABLET | Freq: Two times a day (BID) | ORAL | Status: DC
  Administered 2022-01-27 – 2022-01-29 (×5): 50 mg via ORAL
  Filled 2022-01-26 (×5): qty 1

## 2022-01-26 MED ORDER — ONDANSETRON 4 MG PO TBDP
4.0000 mg | ORAL_TABLET | Freq: Once | ORAL | Status: AC
  Administered 2022-01-26: 4 mg via ORAL
  Filled 2022-01-26: qty 1

## 2022-01-26 MED ORDER — HYDROCHLOROTHIAZIDE 25 MG PO TABS
25.0000 mg | ORAL_TABLET | Freq: Every day | ORAL | Status: DC
  Administered 2022-01-26: 25 mg via ORAL
  Filled 2022-01-26: qty 1

## 2022-01-26 NOTE — ED Notes (Signed)
New complaint of abdominal pain, center of abdomen below belly button. Just started while sitting in the bed at 2120. No new N/V/D. Rates it a 7/10. Pt laid back in the bed and the pain subsided slightly, 4/10. Provider notified.

## 2022-01-26 NOTE — Plan of Care (Signed)
[facility] will assume care on arrival to accepting facility. Until arrival, care as per EDP. However, [facility] available 24/7 for questions and assistance.  Nursing staff, please page [facility] Admits and Consults ([phone]) as soon as the patient arrives to the hospital.   

## 2022-01-26 NOTE — ED Triage Notes (Signed)
Pt reports productive cough for the past 3 weeks, states she has also been very nauseated. Pt denies fever, chills, or any other symptoms. Reports she has been on a steroid with no relief. States she has tested negative for covid and the flu.

## 2022-01-26 NOTE — ED Notes (Signed)
Pt informed multi times that there will be a delay in the Pt being seen by a provider. Family/Pt understood and still seemed upset. Charge informed, for a heads up. Pt given 50 mg of Losartan, Night time dose according to Pt. Pt also given '4mg'$  Zofran due to nausea. Trifan MD notified of all and he gave verbal.

## 2022-01-26 NOTE — ED Provider Notes (Signed)
[facility] DE[facility] Provider Note   CSN: [id number] Arrival date & time: 01/26/22  1432     History  Chief Complaint  Patient presents with  . Cough  . Nausea    ZEHAVA TURSKI is a 88 y.o. female present emergency department with cough, nausea, diarrhea.  Patient reports she has felt generally unwell for about 1 to 2 weeks.  She says she was put on prednisone for "bronchitis" at an urgent care, and is taken about 2 weeks of it, and has felt extremely nauseated since.  She reports objective fevers and chills at home.  Also with dry cough.  HPI     Home Medications Prior to Admission medications   Medication Sig Start Date End Date Taking? Authorizing Provider  acetaminophen (TYLENOL) 500 MG tablet Take 500 mg by mouth every 4 (four) hours as needed for mild pain or headache.    [provider]  albuterol (VENTOLIN HFA) 108 (90 Base) MCG/ACT inhaler TAKE 2 PUFFS BY MOUTH EVERY 6 HOURS AS NEEDED FOR WHEEZE OR SHORTNESS OF BREATH 09/12/21   Janith Lima, MD  aspirin EC 81 MG tablet Take 81 mg by mouth daily. Swallow whole.    [provider]  calcium carbonate (OS-CAL) 600 MG TABS tablet Take 600 mg by mouth 2 (two) times daily with a meal.    [provider]  cholecalciferol (VITAMIN D) 1000 UNITS tablet Take 2,000 Units by mouth daily.     [provider]  docusate sodium (COLACE) 100 MG capsule Take 100 mg by mouth daily as needed.    [provider]  DULoxetine (CYMBALTA) 20 MG capsule Take 1 capsule (20 mg total) by mouth daily. 08/01/21   Janith Lima, MD  fluticasone [facility]) 50 MCG/ACT nasal spray INSTILL 1 SPRAY INTO BOTH NOSTRILS DAILY 01/23/22   Janith Lima, MD  hydrochlorothiazide (HYDRODIURIL) 25 MG tablet TAKE 1 TABLET (25 MG TOTAL) BY MOUTH DAILY. 01/03/22   Janith Lima, MD  HYDROcodone-acetaminophen (NORCO/VICODIN) 5-325 MG tablet Take 1 tablet by mouth every 6 (six) hours as needed. 01/24/22    Gregor Hams, MD  lansoprazole (PREVACID) 15 MG capsule TAKE 1 CAPSULE (15 MG TOTAL) BY MOUTH 2 (TWO) TIMES DAILY BEFORE A MEAL. Patient taking differently: Take 15 mg by mouth daily. 01/01/21   Zehr, Janett Billow D, PA-C  loratadine (CLARITIN) 10 MG tablet TAKE 1 TABLET BY MOUTH EVERY DAY 08/05/21   Janith Lima, MD  LORazepam (ATIVAN) 0.5 MG tablet TAKE 1 TABLET BY MOUTH AT BEDTIME AS NEEDED FOR ANXIETY. 12/24/21   Janith Lima, MD  losartan (COZAAR) 50 MG tablet Take 1 tablet (50 mg total) by mouth 2 (two) times daily. 08/19/21   Janith Lima, MD  metoprolol succinate (TOPROL-XL) 25 MG 24 hr tablet TAKE 1 TABLET (25 MG TOTAL) BY MOUTH DAILY. 10/11/21   Janith Lima, MD  ondansetron (ZOFRAN) 4 MG tablet Take 1 tablet (4 mg total) by mouth every 6 (six) hours as needed for nausea or vomiting. 09/02/21   Esterwood, Amy S, PA-C  ondansetron (ZOFRAN-ODT) 4 MG disintegrating tablet Take 1 tablet (4 mg total) by mouth every 8 (eight) hours as needed for nausea or vomiting. 09/03/21   Isla Pence, MD  pregabalin (LYRICA) 25 MG capsule Take 1 capsule (25 mg total) by mouth at bedtime. 08/13/21   Janith Lima, MD  PREMARIN vaginal cream APPLY 1 APPLICATORFUL 2 3 TIMES A WEEK AS DIRECTED 12/20/18  [provider]  Probiotic Product (ALIGN) 4 MG CAPS Take 4 mg by mouth as needed.     [provider]  RESTASIS 0.05 % ophthalmic emulsion  03/06/18   [provider]  rosuvastatin (CRESTOR) 10 MG tablet Take 1 tablet (10 mg total) by mouth daily. 06/26/21   Janith Lima, MD  tiZANidine (ZANAFLEX) 2 MG tablet Take 1 tablet (2 mg total) by mouth every 8 (eight) hours as needed for muscle spasms. Take mostly at bedtime 01/21/22   Gregor Hams, MD      Allergies    Atorvastatin, Simvastatin, Alendronate sodium, Cefdinir, Doxycycline, Esomeprazole magnesium, Levofloxacin, Omeprazole, Other, Penicillins, Amlodipine, and Shellfish-derived products    Review of Systems   Review of  Systems  Physical Exam Updated Vital Signs BP (!) 191/80 (BP Location: Left Arm)   Pulse 60   Temp 97.8 F (36.6 C) (Oral)   Resp 16   Ht '5\' 5"'$  (1.651 m)   Wt 68.9 kg   SpO2 99%   BMI 25.29 kg/m  Physical Exam Constitutional:      General: She is not in acute distress. HENT:     Head: Normocephalic and atraumatic.  Eyes:     Conjunctiva/sclera: Conjunctivae normal.     Pupils: Pupils are equal, round, and reactive to light.  Cardiovascular:     Rate and Rhythm: Normal rate and regular rhythm.  Pulmonary:     Effort: Pulmonary effort is normal. No respiratory distress.  Abdominal:     General: There is no distension.     Tenderness: There is no abdominal tenderness.  Skin:    General: Skin is warm and dry.  Neurological:     General: No focal deficit present.     Mental Status: She is alert. Mental status is at baseline.  Psychiatric:        Mood and Affect: Mood normal.        Behavior: Behavior normal.     ED Results / Procedures / Treatments   Labs (all labs ordered are listed, but only abnormal results are displayed) Labs Reviewed  COMPREHENSIVE METABOLIC PANEL - Abnormal; Notable for the following components:      Result Value   Sodium 123 (*)    Chloride 90 (*)    Glucose, Bld 112 (*)    All other components within normal limits  CBC - Abnormal; Notable for the following components:   WBC 10.8 (*)    All other components within normal limits  URINALYSIS, ROUTINE W REFLEX MICROSCOPIC - Abnormal; Notable for the following components:   Nitrite POSITIVE (*)    Leukocytes,Ua TRACE (*)    Bacteria, UA MANY (*)    All other components within normal limits  RESP PANEL BY RT-PCR (FLU A&B, COVID) ARPGX2  URINE CULTURE  LIPASE, BLOOD  TSH  OSMOLALITY  OSMOLALITY, URINE  SODIUM, URINE, RANDOM  T4, FREE  BASIC METABOLIC PANEL  CBC    EKG None  Radiology DG Chest 2 View  Result Date: 01/26/2022 CLINICAL DATA:  Productive cough. EXAM: CHEST - 2 VIEW  COMPARISON:  09/03/2021 FINDINGS: The heart is normal in size. Similar aortic tortuosity. Coronary artery calcifications or stents. Subsegmental atelectasis in the lung bases. Pulmonary vasculature is normal. No consolidation, pleural effusion, or pneumothorax. Exaggerated thoracic kyphosis. Mild chronic midthoracic compression deformities. No acute osseous abnormalities are seen. IMPRESSION: 1. Subsegmental bibasilar atelectasis. 2. Coronary artery calcifications or stents. Electronically Signed   By: Aurther Loft.D.  On: 01/26/2022 16:11    Procedures Procedures    Medications Ordered in ED Medications  acetaminophen (TYLENOL) tablet 500 mg (has no administration in time range)  aspirin EC tablet 81 mg (81 mg Oral Given 01/26/22 2225)  DULoxetine (CYMBALTA) DR capsule 20 mg (has no administration in time range)  hydrochlorothiazide (HYDRODIURIL) tablet 25 mg (25 mg Oral Given 01/26/22 2225)  LORazepam (ATIVAN) tablet 0.5 mg (has no administration in time range)  losartan (COZAAR) tablet 50 mg (has no administration in time range)  metoprolol succinate (TOPROL-XL) 24 hr tablet 25 mg (has no administration in time range)  pregabalin (LYRICA) capsule 25 mg (25 mg Oral Given 01/26/22 2231)  promethazine (PHENERGAN) 12.5 mg in sodium chloride 0.9 % 50 mL IVPB (has no administration in time range)  albuterol (PROVENTIL) (2.5 MG/3ML) 0.083% nebulizer solution 2.5 mg (has no administration in time range)  ondansetron (ZOFRAN-ODT) disintegrating tablet 4 mg (4 mg Oral Given 01/26/22 1554)  ondansetron (ZOFRAN-ODT) disintegrating tablet 4 mg (4 mg Oral Given 01/26/22 2035)  losartan (COZAAR) tablet 50 mg (50 mg Oral Given 01/26/22 2057)  cefTRIAXone (ROCEPHIN) 1 g in sodium chloride 0.9 % 100 mL IVPB (0 g Intravenous Stopped 01/26/22 2257)  sodium chloride 0.9 % bolus 1,000 mL (1,000 mLs Intravenous New Bag/Given 01/26/22 2205)    ED Course/ Medical Decision Making/ A&P Clinical Course as of  01/26/22 2335  Sun Jan 26, 2022  2335 Admitted [MT]    Clinical Course User Index [MT] Wyvonnia Dusky, MD                           Medical Decision Making Amount and/or Complexity of Data Reviewed Labs: ordered. Radiology: ordered.  Risk OTC drugs. Prescription drug management. Decision regarding hospitalization.   This patient presents to the ED with concern for generalized weakness, nausea, vomiting, cough. This involves an extensive number of treatment options, and is a complaint that carries with it a high risk of complications and morbidity.  The differential diagnosis includes viral illness versus urinary infection versus other  Additional history obtained from patient's husband at bedside  I ordered and personally interpreted labs.  The pertinent results include: Hyponatremia, which is new, UA consistent with infection.  COVID is negative.  Very minor leukocytosis  I ordered imaging studies including x-ray of the chest I independently visualized and interpreted imaging which showed no focal infiltrate, atelectasis noted I agree with the radiologist interpretation  The patient was maintained on a cardiac monitor.  I personally viewed and interpreted the cardiac monitored which showed an underlying rhythm of: Normal sinus rhythm  I ordered medication including Rocephin for UTI, normal saline for hyponatremia  I have reviewed the patients home medicines and have made adjustments as needed  Test Considered: Low suspicion for acute appendicitis, bowel perforation, or surgical emergency inside the abdomen, with no focal tenderness on exam.  If the patient develops worsening abdominal pain or discomfort, inpatient team can consider CT imaging at that time.  After the interventions noted above, I reevaluated the patient and found that they have: stayed the same  Dispostion:  After consideration of the diagnostic results and the patients response to treatment, I feel that  the patent would benefit from medical admission         Final Clinical Impression(s) / ED Diagnoses Final diagnoses:  Hyponatremia  Urinary tract infection without hematuria, site unspecified  Nausea    Rx / DC Orders ED  Discharge Orders     None         Wyvonnia Dusky, MD 01/26/22 [phone]

## 2022-01-27 DIAGNOSIS — N390 Urinary tract infection, site not specified: Secondary | ICD-10-CM

## 2022-01-27 DIAGNOSIS — Z955 Presence of coronary angioplasty implant and graft: Secondary | ICD-10-CM | POA: Diagnosis not present

## 2022-01-27 DIAGNOSIS — T502X5A Adverse effect of carbonic-anhydrase inhibitors, benzothiadiazides and other diuretics, initial encounter: Secondary | ICD-10-CM | POA: Diagnosis present

## 2022-01-27 DIAGNOSIS — Z79899 Other long term (current) drug therapy: Secondary | ICD-10-CM | POA: Diagnosis not present

## 2022-01-27 DIAGNOSIS — Z91013 Allergy to seafood: Secondary | ICD-10-CM | POA: Diagnosis not present

## 2022-01-27 DIAGNOSIS — K219 Gastro-esophageal reflux disease without esophagitis: Secondary | ICD-10-CM | POA: Diagnosis present

## 2022-01-27 DIAGNOSIS — Z90710 Acquired absence of both cervix and uterus: Secondary | ICD-10-CM | POA: Diagnosis not present

## 2022-01-27 DIAGNOSIS — Z881 Allergy status to other antibiotic agents status: Secondary | ICD-10-CM | POA: Diagnosis not present

## 2022-01-27 DIAGNOSIS — E785 Hyperlipidemia, unspecified: Secondary | ICD-10-CM | POA: Diagnosis present

## 2022-01-27 DIAGNOSIS — E871 Hypo-osmolality and hyponatremia: Secondary | ICD-10-CM

## 2022-01-27 DIAGNOSIS — I110 Hypertensive heart disease with heart failure: Secondary | ICD-10-CM | POA: Diagnosis present

## 2022-01-27 DIAGNOSIS — Z20822 Contact with and (suspected) exposure to covid-19: Secondary | ICD-10-CM | POA: Diagnosis present

## 2022-01-27 DIAGNOSIS — Z7982 Long term (current) use of aspirin: Secondary | ICD-10-CM | POA: Diagnosis not present

## 2022-01-27 DIAGNOSIS — I5022 Chronic systolic (congestive) heart failure: Secondary | ICD-10-CM | POA: Diagnosis present

## 2022-01-27 DIAGNOSIS — Z880 Allergy status to penicillin: Secondary | ICD-10-CM | POA: Diagnosis not present

## 2022-01-27 DIAGNOSIS — Z823 Family history of stroke: Secondary | ICD-10-CM | POA: Diagnosis not present

## 2022-01-27 DIAGNOSIS — F39 Unspecified mood [affective] disorder: Secondary | ICD-10-CM | POA: Diagnosis present

## 2022-01-27 DIAGNOSIS — I2510 Atherosclerotic heart disease of native coronary artery without angina pectoris: Secondary | ICD-10-CM | POA: Diagnosis present

## 2022-01-27 DIAGNOSIS — R351 Nocturia: Secondary | ICD-10-CM | POA: Diagnosis present

## 2022-01-27 DIAGNOSIS — F411 Generalized anxiety disorder: Secondary | ICD-10-CM | POA: Diagnosis present

## 2022-01-27 DIAGNOSIS — Z8249 Family history of ischemic heart disease and other diseases of the circulatory system: Secondary | ICD-10-CM | POA: Diagnosis not present

## 2022-01-27 LAB — RENAL FUNCTION PANEL
Albumin: 3.1 g/dL — ABNORMAL LOW (ref 3.5–5.0)
Albumin: 3.2 g/dL — ABNORMAL LOW (ref 3.5–5.0)
Albumin: 3.1 g/dL — ABNORMAL LOW (ref 3.5–5.0)
Anion gap: 6 (ref 5–15)
Anion gap: 5 (ref 5–15)
Anion gap: 7 (ref 5–15)
BUN: 12 mg/dL (ref 8–23)
BUN: 14 mg/dL (ref 8–23)
BUN: 18 mg/dL (ref 8–23)
CO2: 26 mmol/L (ref 22–32)
CO2: 27 mmol/L (ref 22–32)
CO2: 27 mmol/L (ref 22–32)
Calcium: 8.5 mg/dL — ABNORMAL LOW (ref 8.9–10.3)
Calcium: 8.5 mg/dL — ABNORMAL LOW (ref 8.9–10.3)
Calcium: 8.3 mg/dL — ABNORMAL LOW (ref 8.9–10.3)
Chloride: 99 mmol/L (ref 98–111)
Chloride: 98 mmol/L (ref 98–111)
Chloride: 97 mmol/L — ABNORMAL LOW (ref 98–111)
Creatinine, Ser: 0.78 mg/dL (ref 0.44–1.00)
Creatinine, Ser: 0.81 mg/dL (ref 0.44–1.00)
Creatinine, Ser: 0.94 mg/dL (ref 0.44–1.00)
GFR, Estimated: >60 mL/min (ref >60)
GFR, Estimated: >60 mL/min (ref >60)
GFR, Estimated: 58 mL/min — ABNORMAL LOW (ref >60)
Glucose, Bld: 104 mg/dL — ABNORMAL HIGH (ref 70–99)
Glucose, Bld: 103 mg/dL — ABNORMAL HIGH (ref 70–99)
Glucose, Bld: 107 mg/dL — ABNORMAL HIGH (ref 70–99)
Phosphorus: 2.8 mg/dL (ref 2.5–4.6)
Phosphorus: 2.8 mg/dL (ref 2.5–4.6)
Phosphorus: 3.1 mg/dL (ref 2.5–4.6)
Potassium: 3.5 mmol/L (ref 3.5–5.1)
Potassium: 3.8 mmol/L (ref 3.5–5.1)
Potassium: 3.4 mmol/L — ABNORMAL LOW (ref 3.5–5.1)
Sodium: 131 mmol/L — ABNORMAL LOW (ref 135–145)
Sodium: 130 mmol/L — ABNORMAL LOW (ref 135–145)
Sodium: 131 mmol/L — ABNORMAL LOW (ref 135–145)

## 2022-01-27 LAB — SODIUM, URINE, RANDOM: Sodium, Ur: 68 mmol/L

## 2022-01-27 LAB — T4, FREE: Free T4: 1.54 ng/dL — ABNORMAL HIGH (ref 0.61–1.12)

## 2022-01-27 LAB — OSMOLALITY, URINE: Osmolality, Ur: 490 mOsm/kg (ref 300–900)

## 2022-01-27 LAB — OSMOLALITY: Osmolality: 264 mOsm/kg — ABNORMAL LOW (ref 275–295)

## 2022-01-27 MED ORDER — FAMOTIDINE 20 MG PO TABS
10.0000 mg | ORAL_TABLET | Freq: Two times a day (BID) | ORAL | Status: DC
  Administered 2022-01-27 – 2022-01-29 (×5): 10 mg via ORAL
  Filled 2022-01-27 (×5): qty 1

## 2022-01-27 MED ORDER — TRAMADOL HCL 50 MG PO TABS
50.0000 mg | ORAL_TABLET | Freq: Two times a day (BID) | ORAL | Status: DC | PRN
  Administered 2022-01-27: 50 mg via ORAL
  Filled 2022-01-27: qty 1

## 2022-01-27 MED ORDER — CYCLOSPORINE 0.05 % OP EMUL
1.0000 [drp] | Freq: Two times a day (BID) | OPHTHALMIC | Status: DC
  Administered 2022-01-27 – 2022-01-29 (×4): 1 [drp] via OPHTHALMIC
  Filled 2022-01-27 (×6): qty 30

## 2022-01-27 MED ORDER — ALUM & MAG HYDROXIDE-SIMETH 200-200-20 MG/5ML PO SUSP
30.0000 mL | Freq: Once | ORAL | Status: AC
  Administered 2022-01-27: 30 mL via ORAL

## 2022-01-27 MED ORDER — BENZONATATE 100 MG PO CAPS
100.0000 mg | ORAL_CAPSULE | Freq: Three times a day (TID) | ORAL | Status: DC
  Administered 2022-01-27 – 2022-01-29 (×7): 100 mg via ORAL
  Filled 2022-01-27 (×7): qty 1

## 2022-01-27 MED ORDER — ENOXAPARIN SODIUM 40 MG/0.4ML IJ SOSY
40.0000 mg | PREFILLED_SYRINGE | Freq: Every day | INTRAMUSCULAR | Status: DC
  Administered 2022-01-27 – 2022-01-29 (×3): 40 mg via SUBCUTANEOUS
  Filled 2022-01-27 (×3): qty 0.4

## 2022-01-27 MED ORDER — SODIUM CHLORIDE 0.9 % IV SOLN
1.0000 g | INTRAVENOUS | Status: DC
  Administered 2022-01-27 – 2022-01-28 (×2): 1 g via INTRAVENOUS
  Filled 2022-01-27 (×2): qty 10

## 2022-01-27 MED ORDER — GUAIFENESIN ER 600 MG PO TB12
600.0000 mg | ORAL_TABLET | Freq: Two times a day (BID) | ORAL | Status: DC
  Administered 2022-01-28 – 2022-01-29 (×2): 600 mg via ORAL
  Filled 2022-01-27 (×5): qty 1

## 2022-01-27 MED ORDER — TIZANIDINE HCL 4 MG PO TABS: 2.0000 mg | ORAL_TABLET | Freq: Three times a day (TID) | ORAL | Status: DC | PRN

## 2022-01-27 MED ORDER — HYDRALAZINE HCL 25 MG PO TABS
25.0000 mg | ORAL_TABLET | Freq: Three times a day (TID) | ORAL | Status: DC
  Administered 2022-01-27 – 2022-01-29 (×7): 25 mg via ORAL
  Filled 2022-01-27 (×7): qty 1

## 2022-01-27 NOTE — ED Notes (Signed)
Husband notified of Pt leaving to go to [facility], room number and phone given

## 2022-01-27 NOTE — ED Notes (Signed)
Pt calls that she needs to have BM, helped pt ambulate to the bathroom where she was able to have bm, pt did have urine leaking while ambulating to the bathroom.  New socks given and placed depend on pt for ambulation back.

## 2022-01-27 NOTE — Progress Notes (Signed)
Mobility Specialist - Progress Note   01/27/22 1457  Mobility  Activity Ambulated independently in hallway  Level of Assistance Modified independent, requires aide device or extra time  Assistive Device None  Distance Ambulated (ft) 350 ft  Range of Motion/Exercises Active  Activity Response Tolerated well  Mobility Referral Yes  $Mobility charge 1 Mobility   Pt was found in bed and agreeable to ambulate. It took 2 tries to get out of bed due to not being able to gain balance. After gaining her balance she was able to ambulate and had no complaints. At EOS returned to sit EOB with all necessities in reach.  Ferd Hibbs Mobility Specialist

## 2022-01-27 NOTE — ED Notes (Signed)
ED TO INPATIENT HANDOFF REPORT  ED Nurse Name and Phone #: Orpah Cobb Name/Age/Gender Krystal [name] 88 y.o. female Room/Bed: DB015/DB015  Code Status   Code Status: Prior  Home/SNF/Other Home Patient oriented to: self, place, time, and situation Is this baseline? Yes   Triage Complete: Triage complete  Chief Complaint Hyponatremia [E87.1]  Triage Note Pt reports productive cough for the past 3 weeks, states she has also been very nauseated. Pt denies fever, chills, or any other symptoms. Reports she has been on a steroid with no relief. States she has tested negative for covid and the flu.    Allergies Allergies  Allergen Reactions   Atorvastatin Other (See Comments)    Muscle aches   Simvastatin Other (See Comments)    Myalgias    Alendronate Sodium     REACTION: pt states INTOL to Fosamax w/ esophagitis   Cefdinir     REACTION: diarrhea   Doxycycline     diarrhea   Esomeprazole Magnesium     REACTION: pt states INTOL to Nexium   Levofloxacin     REACTION: diarrhea   Omeprazole     REACTION: pt states INTOL to Prilosec   Other     Pneumonia vaccine---felt tired and achy and sick x 6 months to get over this.   Penicillins Hives   Amlodipine Other (See Comments)    Ankle edema   Shellfish-Derived Products Hives, Nausea Only and Rash    Level of Care/Admitting Diagnosis ED Disposition     ED Disposition  Admit   Condition  --   Comment  Hospital Area: [facility] [[id number]]  Level of Care: Telemetry [5]  Admit to tele based on following criteria: Complex arrhythmia (Bradycardia/Tachycardia)  May [facility] patient to [facility] if equivalent level of care is available:: Yes  Interfacility transfer: Yes  Covid Evaluation: Asymptomatic - no recent exposure (last 10 days) testing not required  Diagnosis: Hyponatremia [198519]  Admitting Physician: Shela Leff [[id number]]  Attending Physician: Wyvonnia Dusky [[id number]]   Certification:: I certify this patient will need inpatient services for at least 2 midnights  Estimated Length of Stay: 2          B Medical/Surgery History Past Medical History:  Diagnosis Date   Adenomatous colon polyp    Allergy    seasonal   Anemia    pt. denies   Anxiety    CAD (coronary artery disease)    Cataract    cataracts removed left eye.   Chronic lower back pain    Diverticulosis    GERD (gastroesophageal reflux disease)    Headache(784.0)    Hiatal hernia    Hyperlipidemia    Hypertension    IBS (irritable bowel syndrome) 10/13/2011   Osteoarthritis    Osteoporosis    Renal cysts, acquired, bilateral    SBO (small bowel obstruction) ([place]) 09/05/2015   Venous insufficiency    Past Surgical History:  Procedure Laterality Date   ABDOMINAL HYSTERECTOMY     BLADDER REPAIR     CARDIAC CATHETERIZATION  01/12/2002   90% stenosis of the left circumflex, stented with a 3x73m Cypher stent, resulting in reduction of 90% to 10%    CARDIOVASCULAR STRESS TEST  03/22/2007   Mild inferolateral thinning toward the apex without significant ischemia. Nondiagnostic electrocardiogram.   CATARACT EXTRACTION     bilateral   COLONOSCOPY  08/17/2008   adenomatous polyp, diverticulosis, external hemorrhoids   COLONOSCOPY W/ BIOPSIES  multiple    LEFT LOWER EXTREMITY VENOUS DOPPLER  06/18/2011   No evidence of left lower extremity DVT   TRANSTHORACIC ECHOCARDIOGRAM  11/18/2012   EF 29-47%, LV systolic mild-moderately reduced, mild-moderate mitral valve regurg, mild-moderate tricuspid valve regurg. NSR-LBBB with occas PVCs   UPPER GASTROINTESTINAL ENDOSCOPY  07/03/2010   hiatal hernia   VARICOSE VEIN SURGERY       A IV Location/Drains/Wounds Patient Lines/Drains/Airways Status     Active Line/Drains/Airways     Name Placement date Placement time Site Days   Peripheral IV 01/26/22 20 G 1" Anterior;Proximal;Right Forearm 01/26/22  2205  Forearm  1   External Urinary  Catheter 01/26/22  2240  --  1            Intake/Output Last 24 hours  Intake/Output Summary (Last 24 hours) at 01/27/2022 6546 Last data filed at 01/26/2022 2346 Gross per 24 hour  Intake 1105.16 ml  Output --  Net 1105.16 ml    Labs/Imaging Results for orders placed or performed during the hospital encounter of 01/26/22 (from the past 48 hour(s))  Lipase, blood     Status: None   Collection Time: 01/26/22  3:48 PM  Result Value Ref Range   Lipase 17 11 - 51 U/L    Comment: Performed at [facility], [address], [place], [place] [address]  Comprehensive metabolic panel     Status: Abnormal   Collection Time: 01/26/22  3:48 PM  Result Value Ref Range   Sodium 123 (L) 135 - 145 mmol/L   Potassium 3.9 3.5 - 5.1 mmol/L   Chloride 90 (L) 98 - 111 mmol/L   CO2 26 22 - 32 mmol/L   Glucose, Bld 112 (H) 70 - 99 mg/dL    Comment: Glucose reference range applies only to samples taken after fasting for at least 8 hours.   BUN 20 8 - 23 mg/dL   Creatinine, Ser 0.88 0.44 - 1.00 mg/dL   Calcium 8.9 8.9 - 10.3 mg/dL   Total Protein 6.5 6.5 - 8.1 g/dL   Albumin 4.0 3.5 - 5.0 g/dL   AST 17 15 - 41 U/L   ALT 14 0 - 44 U/L   Alkaline Phosphatase 77 38 - 126 U/L   Total Bilirubin 0.5 0.3 - 1.2 mg/dL   GFR, Estimated >60 >60 mL/min    Comment: (NOTE) Calculated using the CKD-EPI Creatinine Equation (2021)    Anion gap 7 5 - 15    Comment: Performed at [facility], [address], [place], [place] [address]  CBC     Status: Abnormal   Collection Time: 01/26/22  3:48 PM  Result Value Ref Range   WBC 10.8 (H) 4.0 - 10.5 K/uL   RBC 4.46 3.87 - 5.11 MIL/uL   Hemoglobin 13.9 12.0 - 15.0 g/dL   HCT 40.8 36.0 - 46.0 %   MCV 91.5 80.0 - 100.0 fL   MCH 31.2 26.0 - 34.0 pg   MCHC 34.1 30.0 - 36.0 g/dL   RDW 12.9 11.5 - 15.5 %   Platelets 192 150 - 400 K/uL   nRBC 0.0 0.0 - 0.2 %    Comment: Performed at [facility], [address] [place], [place] [address]  Urinalysis, Routine w reflex microscopic     Status: Abnormal   Collection Time: 01/26/22  3:48 PM  Result Value Ref Range   Color, Urine YELLOW YELLOW   APPearance CLEAR CLEAR   Specific Gravity, Urine 1.024  1.005 - 1.030   pH 6.5 5.0 - 8.0   Glucose, UA NEGATIVE NEGATIVE mg/dL   Hgb urine dipstick NEGATIVE NEGATIVE   Bilirubin Urine NEGATIVE NEGATIVE   Ketones, ur NEGATIVE NEGATIVE mg/dL   Protein, ur NEGATIVE NEGATIVE mg/dL   Nitrite POSITIVE (A) NEGATIVE   Leukocytes,Ua TRACE (A) NEGATIVE   RBC / HPF 0-5 0 - 5 RBC/hpf   WBC, UA 6-10 0 - 5 WBC/hpf   Bacteria, UA MANY (A) NONE SEEN   Squamous Epithelial / LPF 0-5 0 - 5   Mucus PRESENT     Comment: Performed at [facility], [address] [place], [place] [address]  Resp Panel by RT-PCR (Flu A&B, Covid) Anterior Nasal Swab     Status: None   Collection Time: 01/26/22  3:50 PM   Specimen: Anterior Nasal Swab  Result Value Ref Range   SARS Coronavirus 2 by RT PCR NEGATIVE NEGATIVE    Comment: (NOTE) SARS-CoV-2 target nucleic acids are NOT DETECTED.  The SARS-CoV-2 RNA is generally detectable in upper respiratory specimens during the acute phase of infection. The lowest concentration of SARS-CoV-2 viral copies this assay can detect is 138 copies/mL. A negative result does not preclude SARS-Cov-2 infection and should not be used as the sole basis for treatment or other patient management decisions. A negative result may occur with  improper specimen collection/handling, submission of specimen other than nasopharyngeal swab, presence of viral mutation(s) within the areas targeted by this assay, and inadequate number of viral copies(<138 copies/mL). A negative result must be combined with clinical observations, patient history, and epidemiological information. The expected result is Negative.  Fact Sheet for Patients:   EntrepreneurPulse.com.au  Fact Sheet for Healthcare Providers:  IncredibleEmployment.be  This test is no t yet approved or cleared by the [place] FDA and  has been authorized for detection and/or diagnosis of SARS-CoV-2 by FDA under an Emergency Use Authorization (EUA). This EUA will remain  in effect (meaning this test can be used) for the duration of the COVID-19 declaration under Section 564(b)(1) of the Act, 21 U.S.C.section 360bbb-3(b)(1), unless the authorization is terminated  or revoked sooner.       Influenza A by PCR NEGATIVE NEGATIVE   Influenza B by PCR NEGATIVE NEGATIVE    Comment: (NOTE) The Xpert Xpress SARS-CoV-2/FLU/RSV plus assay is intended as an aid in the diagnosis of influenza from Nasopharyngeal swab specimens and should not be used as a sole basis for treatment. Nasal washings and aspirates are unacceptable for Xpert Xpress SARS-CoV-2/FLU/RSV testing.  Fact Sheet for Patients: EntrepreneurPulse.com.au  Fact Sheet for Healthcare Providers: IncredibleEmployment.be  This test is not yet approved or cleared by the [place] FDA and has been authorized for detection and/or diagnosis of SARS-CoV-2 by FDA under an Emergency Use Authorization (EUA). This EUA will remain in effect (meaning this test can be used) for the duration of the COVID-19 declaration under Section 564(b)(1) of the Act, 21 U.S.C. section 360bbb-3(b)(1), unless the authorization is terminated or revoked.  Performed at [facility], [place], [place] [address]   Osmolality, urine     Status: None   Collection Time: 01/26/22  3:50 PM  Result Value Ref Range   Osmolality, Ur 490 300 - 900 mOsm/kg    Comment: RESULT REPEATED AND VERIFIED Performed at [facility], [address] [place], [place] [address]   Sodium, urine, random     Status: None   Collection  Time: 01/26/22  3:50 PM  Result Value Ref Range   Sodium, Ur 68 mmol/L    Comment: Performed at [facility] [address] [place], [place] [address]  Osmolality     Status: Abnormal   Collection Time: 01/26/22  9:49 PM  Result Value Ref Range   Osmolality 264 (L) 275 - 295 mOsm/kg    Comment: RESULT REPEATED AND VERIFIED Performed at [facility], [address] [place], [place] [address]   TSH     Status: None   Collection Time: 01/26/22  9:50 PM  Result Value Ref Range   TSH 2.260 0.350 - 4.500 uIU/mL    Comment: Performed by a 3rd Generation assay with a functional sensitivity of <=0.01 uIU/mL. Performed at [facility], [address] [place], [place] [address]   T4, free     Status: Abnormal   Collection Time: 01/26/22  9:50 PM  Result Value Ref Range   Free T4 1.54 (H) 0.61 - 1.12 ng/dL    Comment: (NOTE) Biotin ingestion may interfere with free T4 tests. If the results are inconsistent with the TSH level, previous test results, or the clinical presentation, then consider biotin interference. If needed, order repeat testing after stopping biotin. Performed at [facility], [place] [address] [place], [place] [address]    DG Chest 2 View  Result Date: 01/26/2022 CLINICAL DATA:  Productive cough. EXAM: CHEST - 2 VIEW COMPARISON:  09/03/2021 FINDINGS: The heart is normal in size. Similar aortic tortuosity. Coronary artery calcifications or stents. Subsegmental atelectasis in the lung bases. Pulmonary vasculature is normal. No consolidation, pleural effusion, or pneumothorax. Exaggerated thoracic kyphosis. Mild chronic midthoracic compression deformities. No acute osseous abnormalities are seen. IMPRESSION: 1. Subsegmental bibasilar atelectasis. 2. Coronary artery calcifications or stents. Electronically Signed   By: Keith Rake M.D.   On: 01/26/2022 16:11    Pending Labs Unresulted Labs (From admission, onward)     Start      Ordered   01/27/22 3500  Basic metabolic panel  Tomorrow morning,   R        01/26/22 2150   01/27/22 0800  CBC  Tomorrow morning,   R        01/26/22 2150   01/26/22 2149  Urine Culture  ONCE - URGENT,   URGENT       Question:  Indication  Answer:  Dysuria   01/26/22 2148            Vitals/Pain Today's Vitals   01/27/22 0121 01/27/22 0230 01/27/22 0330 01/27/22 0400  BP:  (!) 148/66 (!) 155/64 (!) 163/69  Pulse:  64    Resp:  '16 15 17  '$ Temp: 98.1 F (36.7 C)     TempSrc: Oral     SpO2:  94%    Weight:      Height:        Isolation Precautions No active isolations  Medications Medications  acetaminophen (TYLENOL) tablet 500 mg (has no administration in time range)  aspirin EC tablet 81 mg (81 mg Oral Given 01/26/22 2225)  DULoxetine (CYMBALTA) DR capsule 20 mg (has no administration in time range)  hydrochlorothiazide (HYDRODIURIL) tablet 25 mg (25 mg Oral Given 01/26/22 2225)  LORazepam (ATIVAN) tablet 0.5 mg (0.5 mg Oral Given 01/27/22 0050)  losartan (COZAAR) tablet 50 mg (has no administration in time range)  metoprolol succinate (TOPROL-XL) 24 hr tablet 25 mg (has no administration in time range)  pregabalin (LYRICA) capsule 25 mg (25  mg Oral Given 01/26/22 2231)  promethazine (PHENERGAN) 12.5 mg in sodium chloride 0.9 % 50 mL IVPB (has no administration in time range)  albuterol (PROVENTIL) (2.5 MG/3ML) 0.083% nebulizer solution 2.5 mg (has no administration in time range)  0.9 %  sodium chloride infusion ( Intravenous New Bag/Given 01/27/22 0055)  ondansetron (ZOFRAN-ODT) disintegrating tablet 4 mg (4 mg Oral Given 01/26/22 1554)  ondansetron (ZOFRAN-ODT) disintegrating tablet 4 mg (4 mg Oral Given 01/26/22 2035)  losartan (COZAAR) tablet 50 mg (50 mg Oral Given 01/26/22 2057)  cefTRIAXone (ROCEPHIN) 1 g in sodium chloride 0.9 % 100 mL IVPB (0 g Intravenous Stopped 01/26/22 2257)  sodium chloride 0.9 % bolus 1,000 mL ( Intravenous Stopped 01/26/22 2321)  alum  & mag hydroxide-simeth (MAALOX/MYLANTA) 200-200-20 MG/5ML suspension 30 mL (30 mLs Oral Given 01/27/22 0051)    Mobility walks Moderate fall risk   Focused Assessments Pt has purewick in place, she does report burning with urination   R Recommendations: See Admitting Provider Note  Report given to:   Additional Notes:

## 2022-01-27 NOTE — ED Notes (Signed)
Report given to [facility] 

## 2022-01-27 NOTE — Progress Notes (Signed)
Pharmacy Brief Note - Renal Dose Adjustment:  CrCl 41 mL/min. Famotidine 20 mg PO BID reduced to 10 mg PO BID  Lenis Noon, PharmD 01/27/22 11:32 AM

## 2022-01-27 NOTE — H&P (Addendum)
History and Physical    Patient: Krystal [name] JQZ:[id number] DOB: [birth year] DOA: 01/26/2022 DOS: the patient was seen and examined on 01/27/2022 PCP: Janith Lima, MD  Patient coming from: Home  Chief Complaint:  Chief Complaint  Patient presents with   Cough   Nausea   HPI: Krystal [name] is a 88 y.o. female with medical history significant of HTN, CAD, GERD, HLD, anxiety. Presenting with 1 to 2 weeks of nausea and cough. Overall she says she just hasn't felt well during those 2 weeks. She has had a lot of malaise. She did notice some cough and nausea, but couldn't pinpoint what it was from. Her husband had bronchitis, so she went to urgent care to see if that was what was bothering her. She was diagnosed with bronchitis and given steroids. They didn't seem to help her situation; and in fact, she's been more nauseous since taking them. When her symptoms did not improve yesterday, she decided to go to the ED for evaluation. She denies any other aggravating or alleviating factors.   Review of Systems: As mentioned in the history of present illness. All other systems reviewed and are negative. Past Medical History:  Diagnosis Date   Adenomatous colon polyp    Allergy    seasonal   Anemia    pt. denies   Anxiety    CAD (coronary artery disease)    Cataract    cataracts removed left eye.   Chronic lower back pain    Diverticulosis    GERD (gastroesophageal reflux disease)    Headache(784.0)    Hiatal hernia    Hyperlipidemia    Hypertension    IBS (irritable bowel syndrome) 10/13/2011   Osteoarthritis    Osteoporosis    Renal cysts, acquired, bilateral    SBO (small bowel obstruction) ([place]) 09/05/2015   Venous insufficiency    Past Surgical History:  Procedure Laterality Date   ABDOMINAL HYSTERECTOMY     BLADDER REPAIR     CARDIAC CATHETERIZATION  01/12/2002   90% stenosis of the left circumflex, stented with a 3x39m Cypher stent, resulting in reduction of 90% to 10%     CARDIOVASCULAR STRESS TEST  03/22/2007   Mild inferolateral thinning toward the apex without significant ischemia. Nondiagnostic electrocardiogram.   CATARACT EXTRACTION     bilateral   COLONOSCOPY  08/17/2008   adenomatous polyp, diverticulosis, external hemorrhoids   COLONOSCOPY W/ BIOPSIES     multiple    LEFT LOWER EXTREMITY VENOUS DOPPLER  06/18/2011   No evidence of left lower extremity DVT   TRANSTHORACIC ECHOCARDIOGRAM  11/18/2012   EF 462-26% LV systolic mild-moderately reduced, mild-moderate mitral valve regurg, mild-moderate tricuspid valve regurg. NSR-LBBB with occas PVCs   UPPER GASTROINTESTINAL ENDOSCOPY  07/03/2010   hiatal hernia   VARICOSE VEIN SURGERY     Social History:  reports that she has never smoked. She has never used smokeless tobacco. She reports that she does not drink alcohol and does not use drugs.  Allergies  Allergen Reactions   Atorvastatin Other (See Comments)    Muscle aches   Simvastatin Other (See Comments)    Myalgias    Alendronate Sodium     REACTION: pt states INTOL to Fosamax w/ esophagitis   Cefdinir     REACTION: diarrhea   Doxycycline     diarrhea   Esomeprazole Magnesium     REACTION: pt states INTOL to Nexium   Levofloxacin     REACTION: diarrhea   Omeprazole  REACTION: pt states INTOL to Prilosec   Other     Pneumonia vaccine---felt tired and achy and sick x 6 months to get over this.   Penicillins Hives   Amlodipine Other (See Comments)    Ankle edema   Shellfish-Derived Products Hives, Nausea Only and Rash    Family History  Problem Relation Age of Onset   Heart disease Father    Stroke Sister    Colon cancer Neg Hx     Prior to Admission medications   Medication Sig Start Date End Date Taking? Authorizing Provider  acetaminophen (TYLENOL) 500 MG tablet Take 500 mg by mouth every 4 (four) hours as needed for mild pain or headache.   Yes [provider]  albuterol (VENTOLIN HFA) 108 (90 Base) MCG/ACT  inhaler TAKE 2 PUFFS BY MOUTH EVERY 6 HOURS AS NEEDED FOR WHEEZE OR SHORTNESS OF BREATH Patient taking differently: Inhale 2 puffs into the lungs every 6 (six) hours as needed for shortness of breath or wheezing. 09/12/21  Yes Janith Lima, MD  aspirin EC 81 MG tablet Take 81 mg by mouth daily.   Yes [provider]  calcium carbonate (OS-CAL) 600 MG TABS tablet Take 600 mg by mouth 2 (two) times daily with a meal.   Yes [provider]  cholecalciferol (VITAMIN D) 1000 UNITS tablet Take 2,000 Units by mouth daily.    Yes [provider]  cycloSPORINE (RESTASIS) 0.05 % ophthalmic emulsion Place 1 drop into both eyes 2 (two) times daily.   Yes [provider]  docusate sodium (COLACE) 100 MG capsule Take 100 mg by mouth daily as needed (constipation).   Yes [provider]  fluticasone (FLONASE) 50 MCG/ACT nasal spray INSTILL 1 SPRAY INTO BOTH NOSTRILS DAILY 01/23/22  Yes Janith Lima, MD  hydrochlorothiazide (HYDRODIURIL) 25 MG tablet TAKE 1 TABLET (25 MG TOTAL) BY MOUTH DAILY. 01/03/22  Yes Janith Lima, MD  HYDROcodone-acetaminophen (NORCO/VICODIN) 5-325 MG tablet Take 1 tablet by mouth every 6 (six) hours as needed. 01/24/22  Yes Gregor Hams, MD  lansoprazole (PREVACID) 15 MG capsule TAKE 1 CAPSULE (15 MG TOTAL) BY MOUTH 2 (TWO) TIMES DAILY BEFORE A MEAL. Patient taking differently: Take 15 mg by mouth daily. 01/01/21  Yes Zehr, Laban [place], PA-C  loratadine (CLARITIN) 10 MG tablet TAKE 1 TABLET BY MOUTH EVERY DAY 08/05/21  Yes Janith Lima, MD  LORazepam (ATIVAN) 0.5 MG tablet TAKE 1 TABLET BY MOUTH AT BEDTIME AS NEEDED FOR ANXIETY. 12/24/21  Yes Janith Lima, MD  losartan (COZAAR) 50 MG tablet Take 1 tablet (50 mg total) by mouth 2 (two) times daily. 08/19/21  Yes Janith Lima, MD  metoprolol succinate (TOPROL-XL) 25 MG 24 hr tablet TAKE 1 TABLET (25 MG TOTAL) BY MOUTH DAILY. 10/11/21  Yes Janith Lima, MD  pregabalin (LYRICA) 25 MG  capsule Take 1 capsule (25 mg total) by mouth at bedtime. 08/13/21  Yes Janith Lima, MD  PREMARIN vaginal cream Place 1 Application vaginally See admin instructions. Apply vaginally 2-3 times a week as directed 12/20/18  Yes [provider]  Probiotic Product (ALIGN) 4 MG CAPS Take 4 mg by mouth as needed.    Yes [provider]  rosuvastatin (CRESTOR) 10 MG tablet Take 1 tablet (10 mg total) by mouth daily. 06/26/21  Yes Janith Lima, MD  tiZANidine (ZANAFLEX) 2 MG tablet Take 1 tablet (2 mg total) by mouth every 8 (eight) hours as needed for  muscle spasms. Take mostly at bedtime 01/21/22  Yes Gregor Hams, MD  DULoxetine (CYMBALTA) 20 MG capsule Take 1 capsule (20 mg total) by mouth daily. 08/01/21   Janith Lima, MD  ondansetron (ZOFRAN) 4 MG tablet Take 1 tablet (4 mg total) by mouth every 6 (six) hours as needed for nausea or vomiting. 09/02/21   Esterwood, Amy S, PA-C  ondansetron (ZOFRAN-ODT) 4 MG disintegrating tablet Take 1 tablet (4 mg total) by mouth every 8 (eight) hours as needed for nausea or vomiting. 09/03/21   Isla Pence, MD    Physical Exam: Vitals:   01/27/22 0330 01/27/22 0400 01/27/22 0716 01/27/22 0848  BP: (!) 155/64 (!) 163/69 (!) 153/90 (!) 182/77  Pulse:   73 73  Resp: 15 17 (!) 21 16  Temp:   98.1 F (36.7 C) 98.3 F (36.8 C)  TempSrc:   Oral Oral  SpO2:   98% 97%  Weight:      Height:       General: 88 y.o. female resting in bed in NAD Eyes: PERRL, normal sclera ENMT: Nares patent w/o discharge, orophaynx clear, dentition normal, ears w/o discharge/lesions/ulcers Neck: Supple, trachea midline Cardiovascular: RRR, +S1, S2, no m/g/r, equal pulses throughout Respiratory: CTABL, no w/r/r, normal WOB GI: BS+, NDNT, no masses noted, no organomegaly noted MSK: No e/c/c Neuro: A&O x 3, no focal deficits Psyc: Appropriate interaction and affect, calm/cooperative  Data Reviewed:  Results for orders placed or performed during the hospital  encounter of 01/26/22 (from the past 24 hour(s))  Lipase, blood     Status: None   Collection Time: 01/26/22  3:48 PM  Result Value Ref Range   Lipase 17 11 - 51 U/L  Comprehensive metabolic panel     Status: Abnormal   Collection Time: 01/26/22  3:48 PM  Result Value Ref Range   Sodium 123 (L) 135 - 145 mmol/L   Potassium 3.9 3.5 - 5.1 mmol/L   Chloride 90 (L) 98 - 111 mmol/L   CO2 26 22 - 32 mmol/L   Glucose, Bld 112 (H) 70 - 99 mg/dL   BUN 20 8 - 23 mg/dL   Creatinine, Ser 0.88 0.44 - 1.00 mg/dL   Calcium 8.9 8.9 - 10.3 mg/dL   Total Protein 6.5 6.5 - 8.1 g/dL   Albumin 4.0 3.5 - 5.0 g/dL   AST 17 15 - 41 U/L   ALT 14 0 - 44 U/L   Alkaline Phosphatase 77 38 - 126 U/L   Total Bilirubin 0.5 0.3 - 1.2 mg/dL   GFR, Estimated >60 >60 mL/min   Anion gap 7 5 - 15  CBC     Status: Abnormal   Collection Time: 01/26/22  3:48 PM  Result Value Ref Range   WBC 10.8 (H) 4.0 - 10.5 K/uL   RBC 4.46 3.87 - 5.11 MIL/uL   Hemoglobin 13.9 12.0 - 15.0 g/dL   HCT 40.8 36.0 - 46.0 %   MCV 91.5 80.0 - 100.0 fL   MCH 31.2 26.0 - 34.0 pg   MCHC 34.1 30.0 - 36.0 g/dL   RDW 12.9 11.5 - 15.5 %   Platelets 192 150 - 400 K/uL   nRBC 0.0 0.0 - 0.2 %  Urinalysis, Routine w reflex microscopic     Status: Abnormal   Collection Time: 01/26/22  3:48 PM  Result Value Ref Range   Color, Urine YELLOW YELLOW   APPearance CLEAR CLEAR   Specific Gravity, Urine 1.024 1.005 - 1.030  pH 6.5 5.0 - 8.0   Glucose, UA NEGATIVE NEGATIVE mg/dL   Hgb urine dipstick NEGATIVE NEGATIVE   Bilirubin Urine NEGATIVE NEGATIVE   Ketones, ur NEGATIVE NEGATIVE mg/dL   Protein, ur NEGATIVE NEGATIVE mg/dL   Nitrite POSITIVE (A) NEGATIVE   Leukocytes,Ua TRACE (A) NEGATIVE   RBC / HPF 0-5 0 - 5 RBC/hpf   WBC, UA 6-10 0 - 5 WBC/hpf   Bacteria, UA MANY (A) NONE SEEN   Squamous Epithelial / LPF 0-5 0 - 5   Mucus PRESENT   Resp Panel by RT-PCR (Flu A&B, Covid) Anterior Nasal Swab     Status: None   Collection Time: 01/26/22   3:50 PM   Specimen: Anterior Nasal Swab  Result Value Ref Range   SARS Coronavirus 2 by RT PCR NEGATIVE NEGATIVE   Influenza A by PCR NEGATIVE NEGATIVE   Influenza B by PCR NEGATIVE NEGATIVE  Osmolality, urine     Status: None   Collection Time: 01/26/22  3:50 PM  Result Value Ref Range   Osmolality, Ur 490 300 - 900 mOsm/kg  Sodium, urine, random     Status: None   Collection Time: 01/26/22  3:50 PM  Result Value Ref Range   Sodium, Ur 68 mmol/L  Osmolality     Status: Abnormal   Collection Time: 01/26/22  9:49 PM  Result Value Ref Range   Osmolality 264 (L) 275 - 295 mOsm/kg  TSH     Status: None   Collection Time: 01/26/22  9:50 PM  Result Value Ref Range   TSH 2.260 0.350 - 4.500 uIU/mL  T4, free     Status: Abnormal   Collection Time: 01/26/22  9:50 PM  Result Value Ref Range   Free T4 1.54 (H) 0.61 - 1.12 ng/dL   CXR: 1. Subsegmental bibasilar atelectasis. 2. Coronary artery calcifications or stents.  Assessment and Plan: Euvolemic hyponatremia     - admitted to inpt, tele     - follow urine studies     - fluids, q6h renal function panels; limit Na+ rise to 8 pts/day     - hold her HCTZ  UTI     - continue rocephin     - follow UCx  Cough     - guaifenesin, PRN nebs     - incentive spiro     - CXR clear of infection     - COVID/flu negative  HTN     - hold HCTZ     - continue losartan and metoprolol     - add hydralazine  HLD CAD     - continue home regimen  Anxiety     - continue home regimen  GERD     - PPI  Chronic HFpEF     - last echo 2014     - she's euvolemic     - watch I&O; daily weights  Advance Care Planning:   Code Status: FULL  Consults: None  Family Communication: w/ husband at bedside  Severity of Illness: The appropriate patient status for this patient is INPATIENT. Inpatient status is judged to be reasonable and necessary in order to provide the required intensity of service to ensure the patient's safety. The patient's  presenting symptoms, physical exam findings, and initial radiographic and laboratory data in the context of their chronic comorbidities is felt to place them at high risk for further clinical deterioration. Furthermore, it is not anticipated that the patient will be medically stable for discharge from  the hospital within 2 midnights of admission.   * I certify that at the point of admission it is my clinical judgment that the patient will require inpatient hospital care spanning beyond 2 midnights from the point of admission due to high intensity of service, high risk for further deterioration and high frequency of surveillance required.*  Author: Jonnie Finner, DO 01/27/2022 9:10 AM  For on call review [email or web address].

## 2022-01-28 ENCOUNTER — Ambulatory Visit: Payer: PPO | Admitting: Physical Therapy

## 2022-01-28 DIAGNOSIS — N390 Urinary tract infection, site not specified: Secondary | ICD-10-CM

## 2022-01-28 DIAGNOSIS — I2510 Atherosclerotic heart disease of native coronary artery without angina pectoris: Secondary | ICD-10-CM | POA: Diagnosis not present

## 2022-01-28 DIAGNOSIS — Z9861 Coronary angioplasty status: Secondary | ICD-10-CM

## 2022-01-28 DIAGNOSIS — R319 Hematuria, unspecified: Secondary | ICD-10-CM

## 2022-01-28 DIAGNOSIS — E871 Hypo-osmolality and hyponatremia: Secondary | ICD-10-CM | POA: Diagnosis not present

## 2022-01-28 DIAGNOSIS — I5022 Chronic systolic (congestive) heart failure: Secondary | ICD-10-CM | POA: Diagnosis not present

## 2022-01-28 DIAGNOSIS — F411 Generalized anxiety disorder: Secondary | ICD-10-CM

## 2022-01-28 DIAGNOSIS — I10 Essential (primary) hypertension: Secondary | ICD-10-CM

## 2022-01-28 LAB — URINE CULTURE: Culture: <10000 — AB

## 2022-01-28 LAB — RENAL FUNCTION PANEL
Albumin: 3 g/dL — ABNORMAL LOW (ref 3.5–5.0)
Anion gap: 5 (ref 5–15)
BUN: 17 mg/dL (ref 8–23)
CO2: 29 mmol/L (ref 22–32)
Calcium: 8.3 mg/dL — ABNORMAL LOW (ref 8.9–10.3)
Chloride: 99 mmol/L (ref 98–111)
Creatinine, Ser: 0.89 mg/dL (ref 0.44–1.00)
GFR, Estimated: >60 mL/min (ref >60)
Glucose, Bld: 94 mg/dL (ref 70–99)
Phosphorus: 3.1 mg/dL (ref 2.5–4.6)
Potassium: 3.7 mmol/L (ref 3.5–5.1)
Sodium: 133 mmol/L — ABNORMAL LOW (ref 135–145)

## 2022-01-28 LAB — CBC
HCT: 37.1 % (ref 36.0–46.0)
Hemoglobin: 12.1 g/dL (ref 12.0–15.0)
MCH: 31.3 pg (ref 26.0–34.0)
MCHC: 32.6 g/dL (ref 30.0–36.0)
MCV: 96.1 fL (ref 80.0–100.0)
Platelets: 174 10*3/uL (ref 150–400)
RBC: 3.86 MIL/uL — ABNORMAL LOW (ref 3.87–5.11)
RDW: 13.2 % (ref 11.5–15.5)
WBC: 6.6 10*3/uL (ref 4.0–10.5)
nRBC: 0 % (ref 0.0–0.2)

## 2022-01-28 MED ORDER — LORATADINE 10 MG PO TABS
10.0000 mg | ORAL_TABLET | Freq: Every day | ORAL | Status: DC
  Administered 2022-01-29: 10 mg via ORAL
  Filled 2022-01-28: qty 1

## 2022-01-28 NOTE — Progress Notes (Signed)
PROGRESS NOTE  Krystal [name] QHU:[id number] DOB: [birth year]   PCP: Janith Lima, MD  Patient is from: Home.  Independently ambulates at baseline.  DOA: 01/26/2022 LOS: 1  Chief complaints Chief Complaint  Patient presents with   Cough   Nausea     Brief Narrative / Interim history: 88 year old F with PMH of systolic CHF, CAD, HTN, HLD, anxiety and GERD presenting with fever, chills, nausea, cough, malaise and diarrhea for 2 weeks and admitted for hyponatremia and possible UTI.  She was diagnosed with bronchitis and was given a steroid about a week ago that did not seem to help.  Urine culture obtained.  She was started on IV ceftriaxone.  Hyponatremia felt to be due to HCTZ.  Subjective: Seen and examined earlier this morning.  No major events overnight of this morning.  No complaints.  Feels well.  Patient denies UTI symptoms other than brief dysuria she felt while she was in ED. However, she has increased frequency of urination and nocturia but chronic.  Objective: Vitals:   01/27/22 1349 01/27/22 2013 01/27/22 2358 01/28/22 0300  BP: (!) 163/73 123/65 (!) 140/69 138/72  Pulse: 69 75 71 72  Resp: 20 18    Temp: 98.2 F (36.8 C) 98.6 F (37 C) 98.5 F (36.9 C) 98.7 F (37.1 C)  TempSrc: Oral Oral Oral Oral  SpO2: 97% 95% 98% 98%  Weight:    72.4 kg  Height:        Examination:  GENERAL: No apparent distress.  Nontoxic. HEENT: MMM.  Vision and hearing grossly intact.  NECK: Supple.  No apparent JVD.  RESP:  No IWOB.  Fair aeration bilaterally. CVS:  RRR. Heart sounds normal.  ABD/GI/GU: BS+. Abd soft, NTND.  MSK/EXT:  Moves extremities. No apparent deformity. No edema.  SKIN: no apparent skin lesion or wound NEURO: Awake, alert and oriented appropriately.  No apparent focal neuro deficit. PSYCH: Calm. Normal affect.   Procedures:  None  Microbiology summarized: Urine culture with insignificant growth.  Assessment and plan: Principal Problem:    Hyponatremia Active Problems:   Hyperlipidemia with target LDL less than 70   GAD (generalized anxiety disorder)   Essential hypertension   CAD S/P percutaneous coronary angioplasty   Cough   GERD (gastroesophageal reflux disease)   UTI (urinary tract infection)   Chronic systolic CHF (congestive heart failure) (HCC)  Hyponatremia: Na 123>>133. Likely due to HCTZ and ARB.  She also had some diarrhea for the past 1 to 2 weeks that has resolved.  Hyponatremia resolving. -Continue holding HCTZ.  Consider discontinuing on discharge. -Monitor   Possible urinary tract infection: Reports brief dysuria while in ED and chronic frequency and nocturia.  Urine culture with multiple species -Complete 3 days of IV ceftriaxone   Essential hypertension: Normotensive this morning. -Continue home losartan and metoprolol -Continue holding HCTZ -Hydralazine as needed  History of systolic CHF: TTE in 4656 with LVEF of 40 to 45%, G1 DD.  Not on diuretics other than HCTZ.  Appears euvolemic.  History of CAD: No anginal symptoms. -Continue home meds   Anxiety/mood disorder: Stable -Continue home meds.  Recent bronchitis: No respiratory distress. -Supportive care.   GERD -PPI  Body mass index is 26.56 kg/m.           DVT prophylaxis:  enoxaparin (LOVENOX) injection 40 mg Start: 01/27/22 1200  Code Status: Full code Family Communication: None at bedside Level of care: Telemetry Status is: Inpatient Remains inpatient appropriate because: UTI requiring  IV antibiotics   Final disposition: Home in the next 24 to 48 hours. Consultants:  None  Sch Meds:  Scheduled Meds:  aspirin EC  81 mg Oral Daily   benzonatate  100 mg Oral TID   cycloSPORINE  1 drop Both Eyes BID   DULoxetine  20 mg Oral Daily   enoxaparin (LOVENOX) injection  40 mg Subcutaneous Daily   famotidine  10 mg Oral BID   guaiFENesin  600 mg Oral BID   hydrALAZINE  25 mg Oral Q8H   losartan  50 mg Oral BID    metoprolol succinate  25 mg Oral Daily   pregabalin  25 mg Oral QHS   Continuous Infusions:  cefTRIAXone (ROCEPHIN)  IV 1 g (01/27/22 2122)   promethazine (PHENERGAN) injection (IM or IVPB)     PRN Meds:.acetaminophen, albuterol, LORazepam, promethazine (PHENERGAN) injection (IM or IVPB), tiZANidine, traMADol  Antimicrobials: Anti-infectives (From admission, onward)    Start     Dose/Rate Route Frequency Ordered Stop   01/27/22 2200  cefTRIAXone (ROCEPHIN) 1 g in sodium chloride 0.9 % 100 mL IVPB        1 g 200 mL/hr over 30 Minutes Intravenous Every 24 hours 01/27/22 1033     01/26/22 2200  cefTRIAXone (ROCEPHIN) 1 g in sodium chloride 0.9 % 100 mL IVPB        1 g 200 mL/hr over 30 Minutes Intravenous  Once 01/26/22 2148 01/26/22 2257        I have personally reviewed the following labs and images: CBC: Recent Labs  Lab 01/26/22 1548 01/28/22 0407  WBC 10.8* 6.6  HGB 13.9 12.1  HCT 40.8 37.1  MCV 91.5 96.1  PLT 192 174   BMP &GFR Recent Labs  Lab 01/26/22 1548 01/27/22 1057 01/27/22 1637 01/27/22 2236 01/28/22 0407  NA 123* 131* 130* 131* 133*  K 3.9 3.5 3.8 3.4* 3.7  CL 90* 99 98 97* 99  CO2 '26 26 27 27 29  '$ GLUCOSE 112* 104* 103* 107* 94  BUN '20 12 14 18 17  '$ CREATININE 0.88 0.78 0.81 0.94 0.89  CALCIUM 8.9 8.5* 8.5* 8.3* 8.3*  PHOS  --  2.8 2.8 3.1 3.1   Estimated Creatinine Clearance: 41.9 mL/min (by C-G formula based on SCr of 0.89 mg/dL). Liver & Pancreas: Recent Labs  Lab 01/26/22 1548 01/27/22 1057 01/27/22 1637 01/27/22 2236 01/28/22 0407  AST 17  --   --   --   --   ALT 14  --   --   --   --   ALKPHOS 77  --   --   --   --   BILITOT 0.5  --   --   --   --   PROT 6.5  --   --   --   --   ALBUMIN 4.0 3.1* 3.2* 3.1* 3.0*   Recent Labs  Lab 01/26/22 1548  LIPASE 17   No results for input(s): "AMMONIA" in the last 168 hours. Diabetic: No results for input(s): "HGBA1C" in the last 72 hours. No results for input(s): "GLUCAP" in the last  168 hours. Cardiac Enzymes: No results for input(s): "CKTOTAL", "CKMB", "CKMBINDEX", "TROPONINI" in the last 168 hours. No results for input(s): "PROBNP" in the last 8760 hours. Coagulation Profile: No results for input(s): "INR", "PROTIME" in the last 168 hours. Thyroid Function Tests: Recent Labs    01/26/22 2150  TSH 2.260  FREET4 1.54*   Lipid Profile: No results for input(s): "CHOL", "  HDL", "LDLCALC", "TRIG", "CHOLHDL", "LDLDIRECT" in the last 72 hours. Anemia Panel: No results for input(s): "VITAMINB12", "FOLATE", "FERRITIN", "TIBC", "IRON", "RETICCTPCT" in the last 72 hours. Urine analysis:    Component Value Date/Time   COLORURINE YELLOW 01/26/2022 [place] 01/26/2022 1548   LABSPEC 1.024 01/26/2022 1548   PHURINE 6.5 01/26/2022 [place] 01/26/2022 [place] 06/26/2021 1529   HGBUR NEGATIVE 01/26/2022 1548   [place] 01/26/2022 [place] 01/26/2022 1548   PROTEINUR NEGATIVE 01/26/2022 1548   UROBILINOGEN 0.2 06/26/2021 1529   NITRITE POSITIVE (A) 01/26/2022 1548   LEUKOCYTESUR TRACE (A) 01/26/2022 1548   Sepsis Labs: Invalid input(s): "PROCALCITONIN", "LACTICIDVEN"  Microbiology: Recent Results (from the past 240 hour(s))  Resp Panel by RT-PCR (Flu A&B, Covid) Anterior Nasal Swab     Status: None   Collection Time: 01/26/22  3:50 PM   Specimen: Anterior Nasal Swab  Result Value Ref Range Status   SARS Coronavirus 2 by RT PCR NEGATIVE NEGATIVE Final    Comment: (NOTE) SARS-CoV-2 target nucleic acids are NOT DETECTED.  The SARS-CoV-2 RNA is generally detectable in upper respiratory specimens during the acute phase of infection. The lowest concentration of SARS-CoV-2 viral copies this assay can detect is 138 copies/mL. A negative result does not preclude SARS-Cov-2 infection and should not be used as the sole basis for treatment or other patient management decisions. A negative result  may occur with  improper specimen collection/handling, submission of specimen other than nasopharyngeal swab, presence of viral mutation(s) within the areas targeted by this assay, and inadequate number of viral copies(<138 copies/mL). A negative result must be combined with clinical observations, patient history, and epidemiological information. The expected result is Negative.  Fact Sheet for Patients:  EntrepreneurPulse.com.au  Fact Sheet for Healthcare Providers:  IncredibleEmployment.be  This test is no t yet approved or cleared by the [place] FDA and  has been authorized for detection and/or diagnosis of SARS-CoV-2 by FDA under an Emergency Use Authorization (EUA). This EUA will remain  in effect (meaning this test can be used) for the duration of the COVID-19 declaration under Section 564(b)(1) of the Act, 21 U.S.C.section 360bbb-3(b)(1), unless the authorization is terminated  or revoked sooner.       Influenza A by PCR NEGATIVE NEGATIVE Final   Influenza B by PCR NEGATIVE NEGATIVE Final    Comment: (NOTE) The Xpert Xpress SARS-CoV-2/FLU/RSV plus assay is intended as an aid in the diagnosis of influenza from Nasopharyngeal swab specimens and should not be used as a sole basis for treatment. Nasal washings and aspirates are unacceptable for Xpert Xpress SARS-CoV-2/FLU/RSV testing.  Fact Sheet for Patients: EntrepreneurPulse.com.au  Fact Sheet for Healthcare Providers: IncredibleEmployment.be  This test is not yet approved or cleared by the [place] FDA and has been authorized for detection and/or diagnosis of SARS-CoV-2 by FDA under an Emergency Use Authorization (EUA). This EUA will remain in effect (meaning this test can be used) for the duration of the COVID-19 declaration under Section 564(b)(1) of the Act, 21 U.S.C. section 360bbb-3(b)(1), unless the authorization is terminated  or revoked.  Performed at [facility], [address], [place], [place] [address]   Urine Culture     Status: Abnormal   Collection Time: 01/26/22  3:50 PM   Specimen: Urine, Clean Catch  Result Value Ref Range Status   Specimen Description   Final    URINE, CLEAN CATCH Performed at [facility], [address] [place], [place] [address]    Special Requests   Final    NONE Performed at [facility], [address], [place], [place] [address]    Culture (A)  Final    <10,000 COLONIES/mL INSIGNIFICANT GROWTH Performed at [facility], [place] [address] [place], [place] [address]    Report Status 01/28/2022 FINAL  Final    Radiology Studies: No results found.    Jovane Foutz T. [place]  If 7PM-7AM, please contact night-coverage [email or web address] 01/28/2022, 1:25 PM

## 2022-01-28 NOTE — Progress Notes (Signed)
  Transition of Care [facility]) Screening Note   Patient Details  Name: NEKAYLA HEIDER Date of Birth: [birth year]   Transition of Care [facility]) CM/SW Contact:    Roseanne Kaufman, RN Phone Number: 01/28/2022, 3:59 PM    Transition of Care Department [facility]) has reviewed patient and no TOC needs have been identified at this time. We will continue to monitor patient advancement through interdisciplinary progression rounds. If new patient transition needs arise, please place a TOC consult.

## 2022-01-28 NOTE — Progress Notes (Signed)
Mobility Specialist - Progress Note   01/28/22 1529  Mobility  Activity Ambulated independently in hallway  Level of Assistance Modified independent, requires aide device or extra time  Assistive Device None  Distance Ambulated (ft) 800 ft  Range of Motion/Exercises Active  Activity Response Tolerated well  Mobility Referral Yes  $Mobility charge 1 Mobility   Pt was found in bed and agreeable to ambulate. Was still a little unsteady during ambulation and at EOS returned to bed with all necessities in reach.  Ferd Hibbs Mobility Specialist

## 2022-01-28 NOTE — Progress Notes (Signed)
Mrs Barnick took Allegra 60 mg tablet that her husband brought in from home. She stated she knows not to take her own medication from home. Notified patient that is a safety concern and all medication needs to be ordered by a MD and verified through pharmacy. Dr Cyndia Skeeters notified.

## 2022-01-28 NOTE — Progress Notes (Signed)
Mobility Specialist - Progress Note   01/28/22 1000  Mobility  Activity Ambulated independently in hallway  Level of Assistance Standby assist, set-up cues, supervision of patient - no hands on  Assistive Device None  Distance Ambulated (ft) 1050 ft  Range of Motion/Exercises Active  Activity Response Tolerated well  Mobility Referral Yes  $Mobility charge 1 Mobility   Pt was found on recliner chair and agreeable to ambulate. Was a little unsteady and wobbly while ambulating and at EOS returned to recliner chair with all necessities in reach.  Ferd Hibbs Mobility Specialist

## 2022-01-29 DIAGNOSIS — E871 Hypo-osmolality and hyponatremia: Secondary | ICD-10-CM | POA: Diagnosis not present

## 2022-01-29 LAB — RENAL FUNCTION PANEL
Albumin: 2.8 g/dL — ABNORMAL LOW (ref 3.5–5.0)
Anion gap: 5 (ref 5–15)
BUN: 18 mg/dL (ref 8–23)
CO2: 27 mmol/L (ref 22–32)
Calcium: 8.3 mg/dL — ABNORMAL LOW (ref 8.9–10.3)
Chloride: 100 mmol/L (ref 98–111)
Creatinine, Ser: 0.86 mg/dL (ref 0.44–1.00)
GFR, Estimated: >60 mL/min (ref >60)
Glucose, Bld: 105 mg/dL — ABNORMAL HIGH (ref 70–99)
Phosphorus: 2.8 mg/dL (ref 2.5–4.6)
Potassium: 3.5 mmol/L (ref 3.5–5.1)
Sodium: 132 mmol/L — ABNORMAL LOW (ref 135–145)

## 2022-01-29 LAB — CBC
HCT: 36 % (ref 36.0–46.0)
Hemoglobin: 12 g/dL (ref 12.0–15.0)
MCH: 31.3 pg (ref 26.0–34.0)
MCHC: 33.3 g/dL (ref 30.0–36.0)
MCV: 93.8 fL (ref 80.0–100.0)
Platelets: 155 10*3/uL (ref 150–400)
RBC: 3.84 MIL/uL — ABNORMAL LOW (ref 3.87–5.11)
RDW: 12.9 % (ref 11.5–15.5)
WBC: 6.4 10*3/uL (ref 4.0–10.5)
nRBC: 0 % (ref 0.0–0.2)

## 2022-01-29 LAB — MAGNESIUM: Magnesium: 1.9 mg/dL (ref 1.7–2.4)

## 2022-01-29 NOTE — Discharge Summary (Signed)
Physician Discharge Summary  Krystal [name] IHK:[id number] DOB: May 18, 1929 DOA: 01/26/2022  PCP: Janith Lima, MD  Admit date: 01/26/2022 Discharge date: 01/29/2022  Admitted From: Home Disposition:  Home  Discharge Condition:Stable CODE STATUS:FULL Diet recommendation: Heart Healthy  Brief/Interim Summary:  Patient is a 88 year old F with PMH of systolic CHF, CAD, HTN, HLD, anxiety and GERD presenting with fever, chills, nausea, cough, malaise and diarrhea for 2 weeks and admitted for hyponatremia and possible UTI.  She was diagnosed with bronchitis and was given a steroid about a week ago that did not seem to help.  Urine culture obtained.  She was started on IV ceftriaxone.  Hyponatremia felt to be due to HCTZ.  She finished 3 days course of ceftriaxone.  Currently she is comfortable, without any symptoms.  Sodium level has improved in the range of 130s.  Medically stable for discharge.  Following problems were addressed during her hospitalization:    Hyponatremia:  Likely due to HCTZ   She also had some diarrhea for the past 1 to 2 weeks that has resolved.  Hyponatremia resolving. -Continue holding HCTZ.  Check sodium level is 1-2 weeks   Possible urinary tract infection: Reports brief dysuria while in ED and chronic frequency and nocturia.  Urine culture with multiple species -Complete 3 days of IV ceftriaxone   Essential hypertension: Normotensive this morning. -Continue home losartan and metoprolol -Continue holding HCTZ   History of systolic CHF: TTE in 7564 with LVEF of 40 to 45%, G1 DD.  Not on diuretics other than HCTZ.  Appears euvolemic.   History of CAD: No anginal symptoms. -Continue home meds   Anxiety/mood disorder: Stable -Continue home meds.   Recent bronchitis: No respiratory distress. On room air   GERD -PPI    Discharge Diagnoses:  Principal Problem:   Hyponatremia Active Problems:   Hyperlipidemia with target LDL less than 70   GAD  (generalized anxiety disorder)   Essential hypertension   CAD S/P percutaneous coronary angioplasty   Cough   GERD (gastroesophageal reflux disease)   UTI (urinary tract infection)   Chronic systolic CHF (congestive heart failure) (HCC)    Discharge Instructions  Discharge Instructions     Diet - low sodium heart healthy   Complete by: As directed    Discharge instructions   Complete by: As directed    1)Please take your medications as instructed. 2)We have discontinued the medication hydrochlorothiazide we could have contributed to a low sodium level.  Monitor blood pressure at home. 3)Follow up with your PCP in a week.  Do a BMP test to check sodium level during the follow-up   Increase activity slowly   Complete by: As directed       Allergies as of 01/29/2022       Reactions   Lipitor [atorvastatin] Other (See Comments)   Myalgias    Zocor [simvastatin] Other (See Comments)   Myalgias   Doxycycline    diarrhea   Fosamax [alendronate] Other (See Comments)   Esophagitis   Levaquin [levofloxacin] Diarrhea   Nexium [esomeprazole] Nausea Only, Other (See Comments)   Stomach pains/burning sensation   Omnicef [cefdinir] Diarrhea   Penicillins Hives   Pneumococcal Vaccine Other (See Comments)   Fatigue and aches that lasted 6+ months   Prilosec [omeprazole] Nausea Only, Other (See Comments)   Stomach pains/burning sensation   Norvasc [amlodipine] Swelling   Ankle edema   Shellfish Allergy Hives, Nausea Only, Rash   Shellfish-derived Products Hives, Nausea Only, Rash  Medication List     STOP taking these medications    DULoxetine 20 MG capsule Commonly known as: Cymbalta   hydrochlorothiazide 25 MG tablet Commonly known as: HYDRODIURIL   HYDROcodone-acetaminophen 5-325 MG tablet Commonly known as: NORCO/VICODIN   ondansetron 4 MG tablet Commonly known as: ZOFRAN       TAKE these medications    acetaminophen 500 MG tablet Commonly known as:  TYLENOL Take 500 mg by mouth every 4 (four) hours as needed for mild pain or headache.   albuterol 108 (90 Base) MCG/ACT inhaler Commonly known as: VENTOLIN HFA TAKE 2 PUFFS BY MOUTH EVERY 6 HOURS AS NEEDED FOR WHEEZE OR SHORTNESS OF BREATH What changed: See the new instructions.   aspirin EC 81 MG tablet Take 81 mg by mouth daily.   CALCIUM CARBONATE PO Take 1 tablet by mouth daily.   cycloSPORINE 0.05 % ophthalmic emulsion Commonly known as: RESTASIS Place 1 drop into both eyes 2 (two) times daily.   docusate sodium 100 MG capsule Commonly known as: COLACE Take 100 mg by mouth daily as needed (constipation).   fluticasone 50 MCG/ACT nasal spray Commonly known as: FLONASE INSTILL 1 SPRAY INTO BOTH NOSTRILS DAILY What changed:  how much to take how to take this when to take this additional instructions   lansoprazole 15 MG capsule Commonly known as: PREVACID TAKE 1 CAPSULE (15 MG TOTAL) BY MOUTH 2 (TWO) TIMES DAILY BEFORE A MEAL. What changed: when to take this   loratadine 10 MG tablet Commonly known as: CLARITIN TAKE 1 TABLET BY MOUTH EVERY DAY   LORazepam 0.5 MG tablet Commonly known as: ATIVAN TAKE 1 TABLET BY MOUTH AT BEDTIME AS NEEDED FOR ANXIETY. What changed: See the new instructions.   losartan 50 MG tablet Commonly known as: COZAAR Take 1 tablet (50 mg total) by mouth 2 (two) times daily.   metoprolol succinate 25 MG 24 hr tablet Commonly known as: TOPROL-XL TAKE 1 TABLET (25 MG TOTAL) BY MOUTH DAILY.   ondansetron 4 MG disintegrating tablet Commonly known as: ZOFRAN-ODT Take 1 tablet (4 mg total) by mouth every 8 (eight) hours as needed for nausea or vomiting.   pregabalin 25 MG capsule Commonly known as: Lyrica Take 1 capsule (25 mg total) by mouth at bedtime.   Premarin vaginal cream Generic drug: conjugated estrogens Place 1 Application vaginally See admin instructions. Apply vaginally 2-3 times a week as directed   rosuvastatin 10 MG  tablet Commonly known as: CRESTOR Take 1 tablet (10 mg total) by mouth daily.   tiZANidine 2 MG tablet Commonly known as: ZANAFLEX Take 1 tablet (2 mg total) by mouth every 8 (eight) hours as needed for muscle spasms. Take mostly at bedtime   VITAMIN D-3 PO Take 1 capsule by mouth daily.        Follow-up Information     Janith Lima, MD. Schedule an appointment as soon as possible for a visit in 1 week(s).   Specialty: Internal Medicine Contact information: [place] [address] [phone]                Allergies  Allergen Reactions   Lipitor [Atorvastatin] Other (See Comments)    Myalgias    Zocor [Simvastatin] Other (See Comments)    Myalgias    Doxycycline     diarrhea   Fosamax [Alendronate] Other (See Comments)    Esophagitis   Levaquin [Levofloxacin] Diarrhea   Nexium [Esomeprazole] Nausea Only and Other (See Comments)  Stomach pains/burning sensation   Omnicef [Cefdinir] Diarrhea   Penicillins Hives   Pneumococcal Vaccine Other (See Comments)    Fatigue and aches that lasted 6+ months   Prilosec [Omeprazole] Nausea Only and Other (See Comments)    Stomach pains/burning sensation   Norvasc [Amlodipine] Swelling    Ankle edema   Shellfish Allergy Hives, Nausea Only and Rash   Shellfish-Derived Products Hives, Nausea Only and Rash    Consultations: None   Procedures/Studies: DG Chest 2 View  Result Date: 01/26/2022 CLINICAL DATA:  Productive cough. EXAM: CHEST - 2 VIEW COMPARISON:  09/03/2021 FINDINGS: The heart is normal in size. Similar aortic tortuosity. Coronary artery calcifications or stents. Subsegmental atelectasis in the lung bases. Pulmonary vasculature is normal. No consolidation, pleural effusion, or pneumothorax. Exaggerated thoracic kyphosis. Mild chronic midthoracic compression deformities. No acute osseous abnormalities are seen. IMPRESSION: 1. Subsegmental bibasilar atelectasis. 2. Coronary artery  calcifications or stents. Electronically Signed   By: Keith Rake M.D.   On: 01/26/2022 16:11   DG Foot Complete Right  Result Date: 01/08/2022 CLINICAL DATA:  Chronic right foot pain, no known injury. EXAM: RIGHT FOOT COMPLETE - 3+ VIEW COMPARISON:  03/26/2021. FINDINGS: There is no evidence of fracture or dislocation. There is hallux valgus deformity and moderate degenerative changes in the first metatarsophalangeal joint. Mild-to-moderate degenerative changes are noted in the midfoot. There is a large calcaneal spur. Vascular calcifications are present in the soft tissues. IMPRESSION: 1. Hallux valgus deformity with degenerative changes in the first metatarsophalangeal joint. 2. Degenerative changes in the midfoot. 3. Calcaneal spurring. Electronically Signed   By: Brett Fairy M.D.   On: 01/08/2022 03:51      Subjective: Patient seen and examined at bedside today.  Hemodynamically stable for discharge.  Discharge plan discussed with the husband at bedside  Discharge Exam: Vitals:   01/29/22 0506 01/29/22 1331  BP: (!) 154/65 (!) 140/78  Pulse: 63 64  Resp: 18 16  Temp: 98.1 F (36.7 C)   SpO2: 97% 98%   Vitals:   01/28/22 1348 01/28/22 2015 01/29/22 0506 01/29/22 1331  BP: 106/79 (!) 163/93 (!) 154/65 (!) 140/78  Pulse: 69 74 63 64  Resp: '20 18 18 16  '$ Temp: 98.4 F (36.9 C) 97.6 F (36.4 C) 98.1 F (36.7 C)   TempSrc:  Oral Oral   SpO2: 97% 99% 97% 98%  Weight:   69.7 kg   Height:        General: Pt is alert, awake, not in acute distress Cardiovascular: RRR, S1/S2 +, no rubs, no gallops Respiratory: CTA bilaterally, no wheezing, no rhonchi Abdominal: Soft, NT, ND, bowel sounds + Extremities: no edema, no cyanosis    The results of significant diagnostics from this hospitalization (including imaging, microbiology, ancillary and laboratory) are listed below for reference.     Microbiology: Recent Results (from the past 240 hour(s))  Resp Panel by RT-PCR (Flu  A&B, Covid) Anterior Nasal Swab     Status: None   Collection Time: 01/26/22  3:50 PM   Specimen: Anterior Nasal Swab  Result Value Ref Range Status   SARS Coronavirus 2 by RT PCR NEGATIVE NEGATIVE Final    Comment: (NOTE) SARS-CoV-2 target nucleic acids are NOT DETECTED.  The SARS-CoV-2 RNA is generally detectable in upper respiratory specimens during the acute phase of infection. The lowest concentration of SARS-CoV-2 viral copies this assay can detect is 138 copies/mL. A negative result does not preclude SARS-Cov-2 infection and should not be used as the  sole basis for treatment or other patient management decisions. A negative result may occur with  improper specimen collection/handling, submission of specimen other than nasopharyngeal swab, presence of viral mutation(s) within the areas targeted by this assay, and inadequate number of viral copies(<138 copies/mL). A negative result must be combined with clinical observations, patient history, and epidemiological information. The expected result is Negative.  Fact Sheet for Patients:  EntrepreneurPulse.com.au  Fact Sheet for Healthcare Providers:  IncredibleEmployment.be  This test is no t yet approved or cleared by the [place] FDA and  has been authorized for detection and/or diagnosis of SARS-CoV-2 by FDA under an Emergency Use Authorization (EUA). This EUA will remain  in effect (meaning this test can be used) for the duration of the COVID-19 declaration under Section 564(b)(1) of the Act, 21 U.S.C.section 360bbb-3(b)(1), unless the authorization is terminated  or revoked sooner.       Influenza A by PCR NEGATIVE NEGATIVE Final   Influenza B by PCR NEGATIVE NEGATIVE Final    Comment: (NOTE) The Xpert Xpress SARS-CoV-2/FLU/RSV plus assay is intended as an aid in the diagnosis of influenza from Nasopharyngeal swab specimens and should not be used as a sole basis for treatment.  Nasal washings and aspirates are unacceptable for Xpert Xpress SARS-CoV-2/FLU/RSV testing.  Fact Sheet for Patients: EntrepreneurPulse.com.au  Fact Sheet for Healthcare Providers: IncredibleEmployment.be  This test is not yet approved or cleared by the [place] FDA and has been authorized for detection and/or diagnosis of SARS-CoV-2 by FDA under an Emergency Use Authorization (EUA). This EUA will remain in effect (meaning this test can be used) for the duration of the COVID-19 declaration under Section 564(b)(1) of the Act, 21 U.S.C. section 360bbb-3(b)(1), unless the authorization is terminated or revoked.  Performed at [facility], [address] [place], [place] [address]   Urine Culture     Status: Abnormal   Collection Time: 01/26/22  3:50 PM   Specimen: Urine, Clean Catch  Result Value Ref Range Status   Specimen Description   Final    URINE, CLEAN CATCH Performed at [facility], [address] [place], [place] [address]    Special Requests   Final    NONE Performed at [facility], [address] [place], [place] [address]    Culture (A)  Final    <10,000 COLONIES/mL INSIGNIFICANT GROWTH Performed at [facility] [address] [place], [place] [address]    Report Status 01/28/2022 FINAL  Final     Labs: BNP (last 3 results) No results for input(s): "BNP" in the last 8760 hours. Basic Metabolic Panel: Recent Labs  Lab 01/27/22 1057 01/27/22 1637 01/27/22 2236 01/28/22 0407 01/29/22 0359  NA 131* 130* 131* 133* 132*  K 3.5 3.8 3.4* 3.7 3.5  CL 99 98 97* 99 100  CO2 '26 27 27 29 27  '$ GLUCOSE 104* 103* 107* 94 105*  BUN '12 14 18 17 18  '$ CREATININE 0.78 0.81 0.94 0.89 0.86  CALCIUM 8.5* 8.5* 8.3* 8.3* 8.3*  MG  --   --   --   --  1.9  PHOS 2.8 2.8 3.1 3.1 2.8   Liver Function Tests: Recent Labs  Lab 01/26/22 1548 01/27/22 1057  01/27/22 1637 01/27/22 2236 01/28/22 0407 01/29/22 0359  AST 17  --   --   --   --   --   ALT 14  --   --   --   --   --  ALKPHOS 77  --   --   --   --   --   BILITOT 0.5  --   --   --   --   --   PROT 6.5  --   --   --   --   --   ALBUMIN 4.0 3.1* 3.2* 3.1* 3.0* 2.8*   Recent Labs  Lab 01/26/22 1548  LIPASE 17   No results for input(s): "AMMONIA" in the last 168 hours. CBC: Recent Labs  Lab 01/26/22 1548 01/28/22 0407 01/29/22 0359  WBC 10.8* 6.6 6.4  HGB 13.9 12.1 12.0  HCT 40.8 37.1 36.0  MCV 91.5 96.1 93.8  PLT 192 174 155   Cardiac Enzymes: No results for input(s): "CKTOTAL", "CKMB", "CKMBINDEX", "TROPONINI" in the last 168 hours. BNP: Invalid input(s): "POCBNP" CBG: No results for input(s): "GLUCAP" in the last 168 hours. D-Dimer No results for input(s): "DDIMER" in the last 72 hours. Hgb A1c No results for input(s): "HGBA1C" in the last 72 hours. Lipid Profile No results for input(s): "CHOL", "HDL", "LDLCALC", "TRIG", "CHOLHDL", "LDLDIRECT" in the last 72 hours. Thyroid function studies Recent Labs    01/26/22 2150  TSH 2.260   Anemia work up No results for input(s): "VITAMINB12", "FOLATE", "FERRITIN", "TIBC", "IRON", "RETICCTPCT" in the last 72 hours. Urinalysis    Component Value Date/Time   COLORURINE YELLOW 01/26/2022 [place] 01/26/2022 1548   LABSPEC 1.024 01/26/2022 1548   PHURINE 6.5 01/26/2022 [place] 01/26/2022 1548   GLUCOSEU NEGATIVE 06/26/2021 1529   HGBUR NEGATIVE 01/26/2022 1548   BILIRUBINUR NEGATIVE 01/26/2022 1548   KETONESUR NEGATIVE 01/26/2022 1548   PROTEINUR NEGATIVE 01/26/2022 1548   UROBILINOGEN 0.2 06/26/2021 1529   NITRITE POSITIVE (A) 01/26/2022 1548   LEUKOCYTESUR TRACE (A) 01/26/2022 1548   Sepsis Labs Recent Labs  Lab 01/26/22 1548 01/28/22 0407 01/29/22 0359  WBC 10.8* 6.6 6.4   Microbiology Recent Results (from the past 240 hour(s))  Resp Panel by RT-PCR (Flu A&B,  Covid) Anterior Nasal Swab     Status: None   Collection Time: 01/26/22  3:50 PM   Specimen: Anterior Nasal Swab  Result Value Ref Range Status   SARS Coronavirus 2 by RT PCR NEGATIVE NEGATIVE Final    Comment: (NOTE) SARS-CoV-2 target nucleic acids are NOT DETECTED.  The SARS-CoV-2 RNA is generally detectable in upper respiratory specimens during the acute phase of infection. The lowest concentration of SARS-CoV-2 viral copies this assay can detect is 138 copies/mL. A negative result does not preclude SARS-Cov-2 infection and should not be used as the sole basis for treatment or other patient management decisions. A negative result may occur with  improper specimen collection/handling, submission of specimen other than nasopharyngeal swab, presence of viral mutation(s) within the areas targeted by this assay, and inadequate number of viral copies(<138 copies/mL). A negative result must be combined with clinical observations, patient history, and epidemiological information. The expected result is Negative.  Fact Sheet for Patients:  EntrepreneurPulse.com.au  Fact Sheet for Healthcare Providers:  IncredibleEmployment.be  This test is no t yet approved or cleared by the [place] FDA and  has been authorized for detection and/or diagnosis of SARS-CoV-2 by FDA under an Emergency Use Authorization (EUA). This EUA will remain  in effect (meaning this test can be used) for the duration of the COVID-19 declaration under Section 564(b)(1) of the Act, 21 U.S.C.section 360bbb-3(b)(1), unless the authorization is terminated  or revoked sooner.  Influenza A by PCR NEGATIVE NEGATIVE Final   Influenza B by PCR NEGATIVE NEGATIVE Final    Comment: (NOTE) The Xpert Xpress SARS-CoV-2/FLU/RSV plus assay is intended as an aid in the diagnosis of influenza from Nasopharyngeal swab specimens and should not be used as a sole basis for treatment. Nasal  washings and aspirates are unacceptable for Xpert Xpress SARS-CoV-2/FLU/RSV testing.  Fact Sheet for Patients: EntrepreneurPulse.com.au  Fact Sheet for Healthcare Providers: IncredibleEmployment.be  This test is not yet approved or cleared by the [place] FDA and has been authorized for detection and/or diagnosis of SARS-CoV-2 by FDA under an Emergency Use Authorization (EUA). This EUA will remain in effect (meaning this test can be used) for the duration of the COVID-19 declaration under Section 564(b)(1) of the Act, 21 U.S.C. section 360bbb-3(b)(1), unless the authorization is terminated or revoked.  Performed at [facility], [address] [place], [place] [address]   Urine Culture     Status: Abnormal   Collection Time: 01/26/22  3:50 PM   Specimen: Urine, Clean Catch  Result Value Ref Range Status   Specimen Description   Final    URINE, CLEAN CATCH Performed at [facility], [address], [place], [place] [address]    Special Requests   Final    NONE Performed at [facility], [address] [place], [place] [address]    Culture (A)  Final    <10,000 COLONIES/mL INSIGNIFICANT GROWTH Performed at [facility] [address], [place], [place] [address]    Report Status 01/28/2022 FINAL  Final    Please note: You were cared for by a hospitalist during your hospital stay. Once you are discharged, your primary care physician will handle any further medical issues. Please note that NO REFILLS for any discharge medications will be authorized once you are discharged, as it is imperative that you return to your primary care physician (or establish a relationship with a primary care physician if you do not have one) for your post hospital discharge needs so that they can reassess your need for medications and monitor your lab values.    Time coordinating discharge:  40 minutes  SIGNED:   Shelly Coss, MD  [facility] 01/29/2022, 3:02 PM Pager [phone]  If 7PM-7AM, please contact night-coverage [email or web address] Password [email or web address]

## 2022-01-29 NOTE — Progress Notes (Signed)
Mobility Specialist - Progress Note   01/29/22 1403  Mobility  Activity Ambulated independently in hallway  Level of Assistance Independent  Assistive Device None  Distance Ambulated (ft) 700 ft  Activity Response Tolerated well  Mobility Referral Yes  $Mobility charge 1 Mobility   Pt received in recliner and agreeable to mobility. No complaints during mobility. Pt to recliner after session with all needs met.    Post-mobility: 83% HR, 97% SPO2  Set designer

## 2022-01-30 ENCOUNTER — Telehealth: Payer: Self-pay

## 2022-01-30 ENCOUNTER — Telehealth: Payer: PPO

## 2022-01-30 NOTE — Telephone Encounter (Signed)
Transition Care Management Follow-up Telephone Call Date of discharge and from where: Krystal [name] 01-29-22 Dx: hyponatremia How have you been since you were released from the hospital? Still having nausea- and weak   Any questions or concerns? No  Items Reviewed: Did the pt receive and understand the discharge instructions provided? Yes  Medications obtained and verified? Yes  Other? No  Any new allergies since your discharge? No  Dietary orders reviewed? Yes Do you have support at home? Yes   Home Care and Equipment/Supplies: Were home health services ordered? no If so, what is the name of the agency? na Has the agency set up a time to come to the patient's home? not applicable Were any new equipment or medical supplies ordered?  No What is the name of the medical supply agency? na Were you able to get the supplies/equipment? not applicable Do you have any questions related to the use of the equipment or supplies? No  Functional Questionnaire: (I = Independent and D = Dependent) ADLs: I  Bathing/Dressing- I  Meal Prep- I  Eating- I  Maintaining continence- I  Transferring/Ambulation- I  Managing Meds- I  Follow up appointments reviewed:  PCP Hospital f/u appt confirmed? Yes  Scheduled to see Dr Krystal [name]  on 02-05-22 @ [facility] f/u appt confirmed? Yes  Scheduled to see Dr Krystal [name] on 02-11-22 @ 11am. Are transportation arrangements needed? No  If their condition worsens, is the pt aware to call PCP or go to the Emergency Dept.? Yes Was the patient provided with contact information for the PCP's office or ED? Yes Was to pt encouraged to call back with questions or concerns? Yes   [name] LPN [facility] Dial [phone]

## 2022-02-05 ENCOUNTER — Ambulatory Visit (INDEPENDENT_AMBULATORY_CARE_PROVIDER_SITE_OTHER): Payer: PPO | Admitting: Internal Medicine

## 2022-02-05 ENCOUNTER — Ambulatory Visit: Payer: PPO | Admitting: Physical Therapy

## 2022-02-05 VITALS — BP 138/80 | HR 78 | Temp 98.4°F | Ht 65.0 in | Wt 150.0 lb

## 2022-02-05 DIAGNOSIS — I10 Essential (primary) hypertension: Secondary | ICD-10-CM | POA: Diagnosis not present

## 2022-02-05 DIAGNOSIS — G2581 Restless legs syndrome: Secondary | ICD-10-CM

## 2022-02-05 DIAGNOSIS — E871 Hypo-osmolality and hyponatremia: Secondary | ICD-10-CM

## 2022-02-05 DIAGNOSIS — M792 Neuralgia and neuritis, unspecified: Secondary | ICD-10-CM | POA: Diagnosis not present

## 2022-02-05 DIAGNOSIS — R051 Acute cough: Secondary | ICD-10-CM

## 2022-02-05 DIAGNOSIS — R319 Hematuria, unspecified: Secondary | ICD-10-CM | POA: Diagnosis not present

## 2022-02-05 DIAGNOSIS — G8929 Other chronic pain: Secondary | ICD-10-CM | POA: Diagnosis not present

## 2022-02-05 DIAGNOSIS — N390 Urinary tract infection, site not specified: Secondary | ICD-10-CM

## 2022-02-05 LAB — BASIC METABOLIC PANEL
BUN: 15 mg/dL (ref 6–23)
CO2: 29 mEq/L (ref 19–32)
Calcium: 9 mg/dL (ref 8.4–10.5)
Chloride: 101 mEq/L (ref 96–112)
Creatinine, Ser: 0.91 mg/dL (ref 0.40–1.20)
GFR: 55.73 mL/min — ABNORMAL LOW (ref >60.00)
Glucose, Bld: 95 mg/dL (ref 70–99)
Potassium: 4.1 mEq/L (ref 3.5–5.1)
Sodium: 136 mEq/L (ref 135–145)

## 2022-02-05 MED ORDER — METOPROLOL SUCCINATE ER 50 MG PO TB24: 50.0000 mg | ORAL_TABLET | Freq: Every day | ORAL | 3 refills | Status: DC

## 2022-02-05 MED ORDER — PREGABALIN 25 MG PO CAPS: 25.0000 mg | ORAL_CAPSULE | Freq: Every day | ORAL | 1 refills | Status: DC

## 2022-02-05 NOTE — Progress Notes (Signed)
Patient ID: Krystal [name], female   DOB: 01-21-1930, 88 y.o.   MRN: [id number]        Chief Complaint: follow up post hospn oct 29 - Jan 29, 2022       HPI:  Krystal [name] is a 88 y.o. female here with above in f/u after presenting with fever, chills, nausea, cough, malaise and diarrhea for 2 weeks and admitted for hyponatremia and possible UTI.  She was diagnosed with bronchitis and was given a steroid about a week ago that did not seem to help.  Urine culture obtained.  She was started on IV ceftriaxone.  Hyponatremia felt to be due to HCTZ.  She finished 3 days course of ceftriaxone   HCTZ is currently stil being held, but to continue losartan and BB for BP control.  Denies any further fever, confusion, urinary or cough symptoms.  BP has been increased with stopping the HCT.  Not taking the lyrica for burning nerve pain at bedtime, but willing to now start.   Wt Readings from Last 3 Encounters:  02/05/22 150 lb (68 kg)  01/29/22 153 lb 10.6 oz (69.7 kg)  01/21/22 152 lb (68.9 kg)   BP Readings from Last 3 Encounters:  02/05/22 138/80  01/29/22 (!) 140/78  01/21/22 (!) 178/90         Past Medical History:  Diagnosis Date   Adenomatous colon polyp    Allergy    seasonal   Anemia    pt. denies   Anxiety    CAD (coronary artery disease)    Cataract    cataracts removed left eye.   Chronic lower back pain    Diverticulosis    GERD (gastroesophageal reflux disease)    Headache(784.0)    Hiatal hernia    Hyperlipidemia    Hypertension    IBS (irritable bowel syndrome) 10/13/2011   Osteoarthritis    Osteoporosis    Renal cysts, acquired, bilateral    SBO (small bowel obstruction) ([place]) 09/05/2015   Venous insufficiency    Past Surgical History:  Procedure Laterality Date   ABDOMINAL HYSTERECTOMY     BLADDER REPAIR     CARDIAC CATHETERIZATION  01/12/2002   90% stenosis of the left circumflex, stented with a 3x4m Cypher stent, resulting in reduction of 90% to 10%     CARDIOVASCULAR STRESS TEST  03/22/2007   Mild inferolateral thinning toward the apex without significant ischemia. Nondiagnostic electrocardiogram.   CATARACT EXTRACTION     bilateral   COLONOSCOPY  08/17/2008   adenomatous polyp, diverticulosis, external hemorrhoids   COLONOSCOPY W/ BIOPSIES     multiple    LEFT LOWER EXTREMITY VENOUS DOPPLER  06/18/2011   No evidence of left lower extremity DVT   TRANSTHORACIC ECHOCARDIOGRAM  11/18/2012   EF 463-84% LV systolic mild-moderately reduced, mild-moderate mitral valve regurg, mild-moderate tricuspid valve regurg. NSR-LBBB with occas PVCs   UPPER GASTROINTESTINAL ENDOSCOPY  07/03/2010   hiatal hernia   VARICOSE VEIN SURGERY      reports that she has never smoked. She has never used smokeless tobacco. She reports that she does not drink alcohol and does not use drugs. family history includes Heart disease in her father; Stroke in her sister. Allergies  Allergen Reactions   Lipitor [Atorvastatin] Other (See Comments)    Myalgias    Zocor [Simvastatin] Other (See Comments)    Myalgias    Doxycycline     diarrhea   Fosamax [Alendronate] Other (See Comments)    Esophagitis  Levaquin [Levofloxacin] Diarrhea   Nexium [Esomeprazole] Nausea Only and Other (See Comments)    Stomach pains/burning sensation   Omnicef [Cefdinir] Diarrhea   Penicillins Hives   Pneumococcal Vaccine Other (See Comments)    Fatigue and aches that lasted 6+ months   Prilosec [Omeprazole] Nausea Only and Other (See Comments)    Stomach pains/burning sensation   Norvasc [Amlodipine] Swelling    Ankle edema   Shellfish Allergy Hives, Nausea Only and Rash   Shellfish-Derived Products Hives, Nausea Only and Rash   Current Outpatient Medications on File Prior to Visit  Medication Sig Dispense Refill   acetaminophen (TYLENOL) 500 MG tablet Take 500 mg by mouth every 4 (four) hours as needed for mild pain or headache.     albuterol (VENTOLIN HFA) 108 (90 Base) MCG/ACT  inhaler TAKE 2 PUFFS BY MOUTH EVERY 6 HOURS AS NEEDED FOR WHEEZE OR SHORTNESS OF BREATH (Patient taking differently: Inhale 2 puffs into the lungs every 6 (six) hours as needed for shortness of breath or wheezing.) 8.5 each 1   aspirin EC 81 MG tablet Take 81 mg by mouth daily.     Calcium Carbonate Antacid (CALCIUM CARBONATE PO) Take 1 tablet by mouth daily.     Cholecalciferol (VITAMIN D-3 PO) Take 1 capsule by mouth daily.     cycloSPORINE (RESTASIS) 0.05 % ophthalmic emulsion Place 1 drop into both eyes 2 (two) times daily.     fluticasone (FLONASE) 50 MCG/ACT nasal spray INSTILL 1 SPRAY INTO BOTH NOSTRILS DAILY (Patient taking differently: Place 1 spray into both nostrils daily.) 16 mL 5   lansoprazole (PREVACID) 15 MG capsule TAKE 1 CAPSULE (15 MG TOTAL) BY MOUTH 2 (TWO) TIMES DAILY BEFORE A MEAL. (Patient taking differently: Take 15 mg by mouth 2 (two) times daily.) 180 capsule 3   loratadine (CLARITIN) 10 MG tablet TAKE 1 TABLET BY MOUTH EVERY DAY (Patient taking differently: Take 10 mg by mouth daily.) 90 tablet 3   LORazepam (ATIVAN) 0.5 MG tablet TAKE 1 TABLET BY MOUTH AT BEDTIME AS NEEDED FOR ANXIETY. (Patient taking differently: Take 0.5 mg by mouth at bedtime as needed for anxiety.) 90 tablet 1   losartan (COZAAR) 50 MG tablet Take 1 tablet (50 mg total) by mouth 2 (two) times daily. 180 tablet 1   ondansetron (ZOFRAN-ODT) 4 MG disintegrating tablet Take 1 tablet (4 mg total) by mouth every 8 (eight) hours as needed for nausea or vomiting. 20 tablet 0   PREMARIN vaginal cream Place 1 Application vaginally See admin instructions. Apply vaginally 2-3 times a week as directed     rosuvastatin (CRESTOR) 10 MG tablet Take 1 tablet (10 mg total) by mouth daily. 90 tablet 1   tiZANidine (ZANAFLEX) 2 MG tablet Take 1 tablet (2 mg total) by mouth every 8 (eight) hours as needed for muscle spasms. Take mostly at bedtime 60 tablet 1   docusate sodium (COLACE) 100 MG capsule Take 100 mg by mouth  daily as needed (constipation). (Patient not taking: Reported on 02/05/2022)     No current facility-administered medications on file prior to visit.        ROS:  All others reviewed and negative.  Objective        PE:  BP 138/80 (BP Location: Right Arm, Patient Position: Sitting, Cuff Size: Large)   Pulse 78   Temp 98.4 F (36.9 C) (Oral)   Ht '5\' 5"'$  (1.651 m)   Wt 150 lb (68 kg)   SpO2 97%  BMI 24.96 kg/m                 Constitutional: Pt appears in NAD               HENT: Head: NCAT.                Right Ear: External ear normal.                 Left Ear: External ear normal.                Eyes: . Pupils are equal, round, and reactive to light. Conjunctivae and EOM are normal               Nose: without d/c or deformity               Neck: Neck supple. Gross normal ROM               Cardiovascular: Normal rate and regular rhythm.                 Pulmonary/Chest: Effort normal and breath sounds without rales or wheezing.                Abd:  Soft, NT, ND, + BS, no organomegaly               Neurological: Pt is alert. At baseline orientation, motor grossly intact               Skin: Skin is warm. No rashes, no other new lesions, LE edema - none               Psychiatric: Pt behavior is normal without agitation   Micro: none  Cardiac tracings I have personally interpreted today:  none  Pertinent Radiological findings (summarize): none   Lab Results  Component Value Date   WBC 6.4 01/29/2022   HGB 12.0 01/29/2022   HCT 36.0 01/29/2022   PLT 155 01/29/2022   GLUCOSE 95 02/05/2022   CHOL 139 06/26/2021   TRIG 101.0 06/26/2021   HDL 45.80 06/26/2021   LDLDIRECT 160.4 12/19/2008   LDLCALC 73 06/26/2021   ALT 14 01/26/2022   AST 17 01/26/2022   NA 136 02/05/2022   K 4.1 02/05/2022   CL 101 02/05/2022   CREATININE 0.91 02/05/2022   BUN 15 02/05/2022   CO2 29 02/05/2022   TSH 2.260 01/26/2022   HGBA1C 6.1 06/26/2021   Assessment/Plan:  Krystal [name] is a 88 y.o.  White or Caucasian [1] female with  has a past medical history of Adenomatous colon polyp, Allergy, Anemia, Anxiety, CAD (coronary artery disease), Cataract, Chronic lower back pain, Diverticulosis, GERD (gastroesophageal reflux disease), Headache(784.0), Hiatal hernia, Hyperlipidemia, Hypertension, IBS (irritable bowel syndrome) (10/13/2011), Osteoarthritis, Osteoporosis, Renal cysts, acquired, bilateral, SBO (small bowel obstruction) ([place]) (09/05/2015), and Venous insufficiency.  Essential hypertension BP Readings from Last 3 Encounters:  02/05/22 138/80  01/29/22 (!) 140/78  01/21/22 (!) 178/90   uncontrolled pt to continue medical treatment losartan 50 mg qd but increase toprol xl to 50 mg qd    UTI (urinary tract infection) S/p tx course, asymptomatic, no further eval or tx needed  Hyponatremia Mild, possibly in part or toto due to hct now stopped, for f/u lab today  Cough Resolved,  to f/u any worsening symptoms or concerns  Chronic peripheral neuropathic pain Uncontrolled, for start lyrica 25 qhs as per PCP  Followup: No follow-ups on file.  Cathlean Cower,  MD 02/07/2022 8:06 PM [facility]

## 2022-02-05 NOTE — Patient Instructions (Signed)
Ok to increase the metoprolol (toprol) to 50 mg per day  OK to continue Not taking the hctz for now  [facility] to start the Lyrica 25 mg at bedtime for the legs  Please continue all other medications as before, and refills have been done if requested.  Please have the pharmacy call with any other refills you may need.  Please keep your appointments with your specialists as you may have planned  Please go to the LAB at the blood drawing area for the tests to be done  You will be contacted by phone if any changes need to be made immediately.  Otherwise, you will receive a letter about your results with an explanation, but please check with MyChart first.  Please remember to sign up for MyChart if you have not done so, as this will be important to you in the future with finding out test results, communicating by private email, and scheduling acute appointments online when needed.  Please see Dr Ronnald Ramp in 3 months

## 2022-02-07 ENCOUNTER — Ambulatory Visit (INDEPENDENT_AMBULATORY_CARE_PROVIDER_SITE_OTHER): Payer: PPO | Admitting: Surgical

## 2022-02-07 DIAGNOSIS — S46911A Strain of unspecified muscle, fascia and tendon at shoulder and upper arm level, right arm, initial encounter: Secondary | ICD-10-CM

## 2022-02-07 NOTE — Assessment & Plan Note (Signed)
Uncontrolled, for start lyrica 25 qhs as per PCP

## 2022-02-07 NOTE — Assessment & Plan Note (Signed)
BP Readings from Last 3 Encounters:  02/05/22 138/80  01/29/22 (!) 140/78  01/21/22 (!) 178/90   uncontrolled pt to continue medical treatment losartan 50 mg qd but increase toprol xl to 50 mg qd

## 2022-02-07 NOTE — Assessment & Plan Note (Signed)
Mild, possibly in part or toto due to hct now stopped, for f/u lab today

## 2022-02-07 NOTE — Assessment & Plan Note (Signed)
Resolved,  to f/u any worsening symptoms or concerns  

## 2022-02-07 NOTE — Assessment & Plan Note (Signed)
S/p tx course, asymptomatic, no further eval or tx needed

## 2022-02-09 ENCOUNTER — Encounter: Payer: Self-pay | Admitting: Surgical

## 2022-02-09 ENCOUNTER — Other Ambulatory Visit: Payer: Self-pay | Admitting: Internal Medicine

## 2022-02-09 DIAGNOSIS — I10 Essential (primary) hypertension: Secondary | ICD-10-CM

## 2022-02-09 DIAGNOSIS — I2510 Atherosclerotic heart disease of native coronary artery without angina pectoris: Secondary | ICD-10-CM

## 2022-02-09 DIAGNOSIS — I420 Dilated cardiomyopathy: Secondary | ICD-10-CM

## 2022-02-09 NOTE — Progress Notes (Signed)
Office Visit Note   Patient: Krystal [name]           Date of Birth: 1929-12-14           MRN: [id number] Visit Date: 02/07/2022 Requested by: Janith Lima, MD [address] [place],  [place] [address] PCP: Janith Lima, MD  Subjective: Chief Complaint  Patient presents with   Right Shoulder - Pain    HPI: Krystal [name] is a 88 y.o. female who presents to the office reporting right shoulder injury.  She states that she was lifting a case of water on 01/25/2022 when she feels like she pulled a muscle in her shoulder.  She went to urgent care where she was evaluated and told she "may have a fracture".  She did not notice any swelling or bruising at the time of injury.  She was placed in a sling and wore the sling for about 2 weeks before discontinuing the sling by herself.  Most of her pain she localizes to the shoulder blade region with some radiation occasionally to the forearm.  No neck pain, numbness/tingling, pain that wakes her up at night.  She was taking Advil once to twice daily with some good relief.  Currently she feels she is not having any symptoms.  She has returned pretty much back to baseline.  She is sleeping through the night without difficulty.  She is able to use her right shoulder and perform her ADLs without discomfort.  She is able to lift her right arm up over her head..                ROS: All systems reviewed are negative as they relate to the chief complaint within the history of present illness.  Patient denies fevers or chills.  Assessment & Plan: Visit Diagnoses:  1. Right shoulder strain, initial encounter     Plan: Patient is a 88 year old female who presents for evaluation of right shoulder pain.  She pulled a muscle in her shoulder with lifting a case of water on 01/25/2022.  She had radiographs at that time that were reviewed today from disc that was brought in.  No osseous abnormality noted on radiographs that would explain her symptoms especially  now that she has returned to baseline does not have any persistent symptoms currently.  She has full active range of motion of the right shoulder relative to her passive motion and she has excellent rotator cuff strength.  Impression is muscle strain with return to baseline at this point.  Recommended she slowly increase how much she is lifting with return to full activity in the next 2 to 4 weeks based on how she feels she is progressing.  She will follow-up for final check in 4 weeks if she is still having symptoms but if she is 100% back to normal still at that point, she may call and cancel her appointment.  Follow-Up Instructions: No follow-ups on file.   Orders:  No orders of the defined types were placed in this encounter.  No orders of the defined types were placed in this encounter.     Procedures: No procedures performed   Clinical Data: No additional findings.  Objective: Vital Signs: There were no vitals taken for this visit.  Physical Exam:  Constitutional: Patient appears well-developed HEENT:  Head: Normocephalic Eyes:EOM are normal Neck: Normal range of motion Cardiovascular: Normal rate Pulmonary/chest: Effort normal Neurologic: Patient is alert Skin: Skin is warm Psychiatric: Patient has  normal mood and affect  Ortho Exam: Ortho exam demonstrates right shoulder with 80 degrees X rotation, 90 degrees abduction, 160 degrees forward flexion.  She has excellent rotator cuff strength of supra, infra, subscap.  Axillary nerve is intact with deltoid firing.  Intact EPL, FPL, finger abduction, grip strength testing, pronation/supination, bicep, tricep, deltoid.  She has no ecchymosis or bruising or swelling noted.  She has no tenderness over the bicipital groove.  No tenderness over the [facility] joint.  No tenderness over the acromion or clavicular shaft.  No reproduction of pain with rotator cuff strength testing.  She has full active range of motion equivalent to passive range  of motion.  Specialty Comments:  No specialty comments available.  Imaging: No results found.   PMFS History: Patient Active Problem List   Diagnosis Date Noted   Chronic systolic CHF (congestive heart failure) ([place]) 01/28/2022   UTI (urinary tract infection) 01/27/2022   Hyponatremia 01/26/2022   Laceration of occipital scalp 11/08/2021   Encounter for general adult medical examination with abnormal findings 05/08/2020   Primary osteoarthritis of first carpometacarpal joint of right hand 05/08/2020   Restless legs syndrome (RLS) 08/22/2019   Chronic peripheral neuropathic pain 08/22/2019   Chronic renal disease, stage 3, moderately decreased glomerular filtration rate (GFR) between 30-59 mL/min/1.73 square meter (H[facility]) 11/08/2018   Chronic idiopathic constipation 10/19/2017   LBBB (left bundle branch block) 07/16/2016   Cardiomyopathy ([place]) 07/16/2016   Benign paroxysmal positional vertigo 01/17/2016   Prediabetes 05/23/2015   IBS (irritable bowel syndrome) 10/13/2011   GERD (gastroesophageal reflux disease) 06/25/2010   Cough 03/09/2008   RENAL CYST 11/18/2007   OSTEOPOROSIS 11/18/2007   Allergic rhinitis 10/06/2007   Hyperlipidemia with target LDL less than 70 07/19/2007   GAD (generalized anxiety disorder) 07/19/2007   Essential hypertension 07/19/2007   CAD S/P percutaneous coronary angioplasty 07/19/2007   VENOUS INSUFFICIENCY 07/19/2007   Osteoarthritis 07/19/2007   LOW BACK PAIN SYNDROME 07/19/2007   Past Medical History:  Diagnosis Date   Adenomatous colon polyp    Allergy    seasonal   Anemia    pt. denies   Anxiety    CAD (coronary artery disease)    Cataract    cataracts removed left eye.   Chronic lower back pain    Diverticulosis    GERD (gastroesophageal reflux disease)    Headache(784.0)    Hiatal hernia    Hyperlipidemia    Hypertension    IBS (irritable bowel syndrome) 10/13/2011   Osteoarthritis    Osteoporosis    Renal cysts, acquired,  bilateral    SBO (small bowel obstruction) ([place]) 09/05/2015   Venous insufficiency     Family History  Problem Relation Age of Onset   Heart disease Father    Stroke Sister    Colon cancer Neg Hx     Past Surgical History:  Procedure Laterality Date   ABDOMINAL HYSTERECTOMY     BLADDER REPAIR     CARDIAC CATHETERIZATION  01/12/2002   90% stenosis of the left circumflex, stented with a 3x46m Cypher stent, resulting in reduction of 90% to 10%    CARDIOVASCULAR STRESS TEST  03/22/2007   Mild inferolateral thinning toward the apex without significant ischemia. Nondiagnostic electrocardiogram.   CATARACT EXTRACTION     bilateral   COLONOSCOPY  08/17/2008   adenomatous polyp, diverticulosis, external hemorrhoids   COLONOSCOPY W/ BIOPSIES     multiple    LEFT LOWER EXTREMITY VENOUS DOPPLER  06/18/2011   No  evidence of left lower extremity DVT   TRANSTHORACIC ECHOCARDIOGRAM  11/18/2012   EF 15-94%, LV systolic mild-moderately reduced, mild-moderate mitral valve regurg, mild-moderate tricuspid valve regurg. NSR-LBBB with occas PVCs   UPPER GASTROINTESTINAL ENDOSCOPY  07/03/2010   hiatal hernia   VARICOSE VEIN SURGERY     Social History   Occupational History   Occupation: retired  Tobacco Use   Smoking status: Never   Smokeless tobacco: Never  Vaping Use   Vaping Use: Never used  Substance and Sexual Activity   Alcohol use: No    Alcohol/week: 0.0 standard drinks of alcohol   Drug use: No   Sexual activity: Not on file

## 2022-02-11 ENCOUNTER — Ambulatory Visit: Payer: PPO | Admitting: Family Medicine

## 2022-02-11 NOTE — Therapy (Incomplete)
OUTPATIENT PHYSICAL THERAPY TREATMENT NOTE   Patient Name: Krystal [name] MRN: [id number] DOB:1930-02-18, 88 y.o., female Today's Date: 02/11/2022  PCP: Janith Lima, MD REFERRING PROVIDER: Gregor Hams, MD   END OF SESSION:    Past Medical History:  Diagnosis Date   Adenomatous colon polyp    Allergy    seasonal   Anemia    pt. denies   Anxiety    CAD (coronary artery disease)    Cataract    cataracts removed left eye.   Chronic lower back pain    Diverticulosis    GERD (gastroesophageal reflux disease)    Headache(784.0)    Hiatal hernia    Hyperlipidemia    Hypertension    IBS (irritable bowel syndrome) 10/13/2011   Osteoarthritis    Osteoporosis    Renal cysts, acquired, bilateral    SBO (small bowel obstruction) ([place]) 09/05/2015   Venous insufficiency    Past Surgical History:  Procedure Laterality Date   ABDOMINAL HYSTERECTOMY     BLADDER REPAIR     CARDIAC CATHETERIZATION  01/12/2002   90% stenosis of the left circumflex, stented with a 3x42m Cypher stent, resulting in reduction of 90% to 10%    CARDIOVASCULAR STRESS TEST  03/22/2007   Mild inferolateral thinning toward the apex without significant ischemia. Nondiagnostic electrocardiogram.   CATARACT EXTRACTION     bilateral   COLONOSCOPY  08/17/2008   adenomatous polyp, diverticulosis, external hemorrhoids   COLONOSCOPY W/ BIOPSIES     multiple    LEFT LOWER EXTREMITY VENOUS DOPPLER  06/18/2011   No evidence of left lower extremity DVT   TRANSTHORACIC ECHOCARDIOGRAM  11/18/2012   EF 441-66% LV systolic mild-moderately reduced, mild-moderate mitral valve regurg, mild-moderate tricuspid valve regurg. NSR-LBBB with occas PVCs   UPPER GASTROINTESTINAL ENDOSCOPY  07/03/2010   hiatal hernia   VARICOSE VEIN SURGERY     Patient Active Problem List   Diagnosis Date Noted   Chronic systolic CHF (congestive heart failure) ([facility] 01/28/2022   UTI (urinary tract infection) 01/27/2022   Hyponatremia 01/26/2022    Laceration of occipital scalp 11/08/2021   Encounter for general adult medical examination with abnormal findings 05/08/2020   Primary osteoarthritis of first carpometacarpal joint of right hand 05/08/2020   Restless legs syndrome (RLS) 08/22/2019   Chronic peripheral neuropathic pain 08/22/2019   Chronic renal disease, stage 3, moderately decreased glomerular filtration rate (GFR) between 30-59 mL/min/1.73 square meter (HCC) 11/08/2018   Chronic idiopathic constipation 10/19/2017   LBBB (left bundle branch block) 07/16/2016   Cardiomyopathy [place] 07/16/2016   Benign paroxysmal positional vertigo 01/17/2016   Prediabetes 05/23/2015   IBS (irritable bowel syndrome) 10/13/2011   GERD (gastroesophageal reflux disease) 06/25/2010   Cough 03/09/2008   RENAL CYST 11/18/2007   OSTEOPOROSIS 11/18/2007   Allergic rhinitis 10/06/2007   Hyperlipidemia with target LDL less than 70 07/19/2007   GAD (generalized anxiety disorder) 07/19/2007   Essential hypertension 07/19/2007   CAD S/P percutaneous coronary angioplasty 07/19/2007   VENOUS INSUFFICIENCY 07/19/2007   Osteoarthritis 07/19/2007   LOW BACK PAIN SYNDROME 07/19/2007    REFERRING DIAG: S29.012A (ICD-10-CM) - Strain of rhomboid muscle, initial encounter   THERAPY DIAG:  No diagnosis found.  Rationale for Evaluation and Treatment Rehabilitation  PERTINENT HISTORY: osteoporosis, pt had picked up a heavy object on Monday, 10/16. Pt locates pain to around her R scapula. Pt was seen at urgent care on 10/22   PRECAUTIONS: osteoporosis  SUBJECTIVE:  SUBJECTIVE STATEMENT:  ***   PAIN:  Are you having pain? Yes NPRS scale: 5/10 Pain location: Rt intrascapular Pain orientation: Right  PAIN TYPE: aching and dull Pain description: constant and dull   Aggravating factors: constant toothache Relieving factors: meds, heat  LIVING ENVIRONMENT: Lives with: lives with their spouse Lives in: House/apartment Stairs: No Has following equipment at home: None   OCCUPATION: retired   PLOF: Independent   PATIENT GOALS: pain relief  OBJECTIVE: (objective measures completed at initial evaluation unless otherwise dated)  DIAGNOSTIC FINDINGS:  none   PATIENT SURVEYS:  FOTO 61% goal 70%   COGNITION: Overall cognitive status: Within functional limits for tasks assessed   SENSATION: WFL   POSTURE: forward head and increased thoracic kyphosis   PALPATION: Rhomboid on Rt, spasm present, tender to moderate pressure, slight tenderness in Rt levator        CERVICAL ROM:    Active ROM A/PROM (deg) eval  Flexion 45  Extension 40  Right lateral flexion 40  Left lateral flexion 40  Right rotation 60  Left rotation 60   (Blank rows = not tested)   UPPER EXTREMITY ROM: WFL bil without pain   UPPER EXTREMITY MMT: 4/5 grossly bil UE without pain on testing   CERVICAL SPECIAL TESTS:      FUNCTIONAL TESTS:        TODAY'S TREATMENT:                                                                                                                         DATE:   01/22/22 (EVAL):  Issued HEP, advised continued use of heat  02/11/22      PATIENT EDUCATION:  Education details: Access Code: [email or web address] Person educated: Patient Education method: Explanation, Demonstration, Verbal cues, and Handouts Education comprehension: verbalized understanding and returned demonstration   HOME EXERCISE PROGRAM: Access Code: [email or web address] URL: [email or web address] Date: 01/22/2022 Prepared by: Venetia Night Edith Groleau   Exercises - Baby Eagle   - 3 x daily - 7 x weekly - 1 sets - 1 reps - 20 sec hold - Gentle Levator Scapulae Stretch  - 3 x daily - 7 x weekly - 1 sets - 1 reps - 20 sec hold - Seated Scapular Retraction  - 3 x daily -  7 x weekly - 1 sets - 10 reps - Seated Shoulder Shrug Circles AROM Backward  - 3 x daily - 7 x weekly - 1 sets - 10 reps   ASSESSMENT:   CLINICAL IMPRESSION: Patient is a 88 y.o. female who was seen today for physical therapy evaluation and treatment for Rt intrascapular pain which started mid-Oct after picking up a heavy case of water.  Symptoms are improving over time.  Pt ROM WFL for neck and bil UE without pain.  Pt able to perform MMT for bil UE without pain, 4/5 grossly.  Pt now able to tolerate moderate pressure palpation over Rt rhomboid which appears to be pain source.  HEP issued for  stretching and gentle mobility which was well tolerated during evaluation.  Pt will benefit from skilled PT to progress healing and recovery and maximize return to daily activities.   OBJECTIVE IMPAIRMENTS: decreased activity tolerance, decreased mobility, decreased ROM, decreased strength, increased fascial restrictions, increased muscle spasms, impaired flexibility, impaired UE functional use, improper body mechanics, postural dysfunction, and pain.    ACTIVITY LIMITATIONS: carrying, lifting, sleeping, bed mobility, bathing, dressing, and reach over head   PARTICIPATION LIMITATIONS: meal prep, cleaning, laundry, driving, shopping, community activity, and yard work   PERSONAL FACTORS: Age are also affecting patient's functional outcome.    REHAB POTENTIAL: Good   CLINICAL DECISION MAKING: Stable/uncomplicated   EVALUATION COMPLEXITY: Low     GOALS: Goals reviewed with patient? Yes   SHORT TERM GOALS: Target date: 02/19/2022    Pt will be ind with initial HEP without exacerbation of symptoms. Baseline:  Goal status: INITIAL   2.  Pt will be able to perform light lifting with good body mechanics without pain Baseline:  Goal status: INITIAL   3.  Pt will report pain reduction to no greater than 3/10 with daily activities. Baseline:  Goal status: INITIAL       LONG TERM GOALS: Target  date: 03/19/22   Pt will be ind with advanced HEP and understand prevention of future injury with daily tasks. Baseline:  Goal status: INITIAL   2.  FOTO score will improve to at least 70% to demo improved function. Baseline: 61% Goal status: INITIAL   3.  Pt will report pain no greater than 2/10 with daily activities. Baseline:  Goal status: INITIAL      PLAN:   PT FREQUENCY: 1x/week   PT DURATION: 6 weeks   PLANNED INTERVENTIONS: Therapeutic exercises, Therapeutic activity, Neuromuscular re-education, Patient/Family education, Self Care, Joint mobilization, Dry Needling, Electrical stimulation, Spinal mobilization, Moist heat, Ionotophoresis '4mg'$ /ml Dexamethasone, and Manual therapy   PLAN FOR NEXT SESSION: review HEP and incorporate light resistance bands for row, ext, ER, horiz abd as tol, STM Rt rhomboid, moist heat, body mechanics for lifting/carrying    Lynessa Almanzar E Evans Levee, PT 02/11/2022, 11:28 AM

## 2022-02-12 ENCOUNTER — Ambulatory Visit: Payer: PPO | Admitting: Physical Therapy

## 2022-02-12 ENCOUNTER — Telehealth: Payer: Self-pay | Admitting: Physical Therapy

## 2022-02-12 NOTE — Telephone Encounter (Signed)
Pt was a no show for PT appointment on 02/12/22 at 10:15am.  PT left voicemail requesting a call back to our office to confirm her remaining schedule or see if she is ready to d/c.    Baruch Merl, PT 02/12/22 10:49 AM

## 2022-02-19 ENCOUNTER — Encounter: Payer: PPO | Admitting: Physical Therapy

## 2022-02-26 ENCOUNTER — Encounter: Payer: PPO | Admitting: Physical Therapy

## 2022-03-05 ENCOUNTER — Telehealth: Payer: Self-pay | Admitting: Physical Therapy

## 2022-03-05 ENCOUNTER — Ambulatory Visit (INDEPENDENT_AMBULATORY_CARE_PROVIDER_SITE_OTHER): Payer: PPO | Admitting: Internal Medicine

## 2022-03-05 ENCOUNTER — Ambulatory Visit: Payer: PPO | Attending: Family Medicine | Admitting: Physical Therapy

## 2022-03-05 VITALS — BP 124/76 | HR 67 | Temp 98.2°F | Ht 65.0 in | Wt 153.0 lb

## 2022-03-05 DIAGNOSIS — S29012A Strain of muscle and tendon of back wall of thorax, initial encounter: Secondary | ICD-10-CM | POA: Insufficient documentation

## 2022-03-05 DIAGNOSIS — M6281 Muscle weakness (generalized): Secondary | ICD-10-CM | POA: Insufficient documentation

## 2022-03-05 DIAGNOSIS — K219 Gastro-esophageal reflux disease without esophagitis: Secondary | ICD-10-CM | POA: Diagnosis not present

## 2022-03-05 DIAGNOSIS — E871 Hypo-osmolality and hyponatremia: Secondary | ICD-10-CM | POA: Diagnosis not present

## 2022-03-05 DIAGNOSIS — G8929 Other chronic pain: Secondary | ICD-10-CM | POA: Diagnosis not present

## 2022-03-05 DIAGNOSIS — R350 Frequency of micturition: Secondary | ICD-10-CM

## 2022-03-05 DIAGNOSIS — R293 Abnormal posture: Secondary | ICD-10-CM | POA: Insufficient documentation

## 2022-03-05 DIAGNOSIS — M545 Low back pain, unspecified: Secondary | ICD-10-CM

## 2022-03-05 DIAGNOSIS — I10 Essential (primary) hypertension: Secondary | ICD-10-CM

## 2022-03-05 DIAGNOSIS — M546 Pain in thoracic spine: Secondary | ICD-10-CM | POA: Insufficient documentation

## 2022-03-05 LAB — CBC WITH DIFFERENTIAL/PLATELET
Basophils Absolute: 0 10*3/uL (ref 0.0–0.1)
Basophils Relative: 0.6 % (ref 0.0–3.0)
Eosinophils Absolute: 0.2 10*3/uL (ref 0.0–0.7)
Eosinophils Relative: 2.5 % (ref 0.0–5.0)
HCT: 39.1 % (ref 36.0–46.0)
Hemoglobin: 13 g/dL (ref 12.0–15.0)
Lymphocytes Relative: 25.2 % (ref 12.0–46.0)
Lymphs Abs: 1.9 10*3/uL (ref 0.7–4.0)
MCHC: 33.3 g/dL (ref 30.0–36.0)
MCV: 94.3 fl (ref 78.0–100.0)
Monocytes Absolute: 0.7 10*3/uL (ref 0.1–1.0)
Monocytes Relative: 8.7 % (ref 3.0–12.0)
Neutro Abs: 4.8 10*3/uL (ref 1.4–7.7)
Neutrophils Relative %: 63 % (ref 43.0–77.0)
Platelets: 207 10*3/uL (ref 150.0–400.0)
RBC: 4.15 Mil/uL (ref 3.87–5.11)
RDW: 14.3 % (ref 11.5–15.5)
WBC: 7.6 10*3/uL (ref 4.0–10.5)

## 2022-03-05 LAB — HEPATIC FUNCTION PANEL
ALT: 13 U/L (ref 0–35)
AST: 19 U/L (ref 0–37)
Albumin: 4 g/dL (ref 3.5–5.2)
Alkaline Phosphatase: 84 U/L (ref 39–117)
Bilirubin, Direct: 0.1 mg/dL (ref 0.0–0.3)
Total Bilirubin: 0.5 mg/dL (ref 0.2–1.2)
Total Protein: 6.3 g/dL (ref 6.0–8.3)

## 2022-03-05 LAB — URINALYSIS, ROUTINE W REFLEX MICROSCOPIC
Bilirubin Urine: NEGATIVE
Hgb urine dipstick: NEGATIVE
Ketones, ur: NEGATIVE
Leukocytes,Ua: NEGATIVE
Nitrite: NEGATIVE
RBC / HPF: NONE SEEN (ref 0–?)
Specific Gravity, Urine: <=1.005 — AB (ref 1.000–1.030)
Total Protein, Urine: NEGATIVE
Urine Glucose: NEGATIVE
Urobilinogen, UA: 0.2 (ref 0.0–1.0)
pH: 6 (ref 5.0–8.0)

## 2022-03-05 LAB — BASIC METABOLIC PANEL
BUN: 18 mg/dL (ref 6–23)
CO2: 29 mEq/L (ref 19–32)
Calcium: 8.7 mg/dL (ref 8.4–10.5)
Chloride: 104 mEq/L (ref 96–112)
Creatinine, Ser: 0.89 mg/dL (ref 0.40–1.20)
GFR: 57.2 mL/min — ABNORMAL LOW (ref >60.00)
Glucose, Bld: 87 mg/dL (ref 70–99)
Potassium: 4.4 mEq/L (ref 3.5–5.1)
Sodium: 138 mEq/L (ref 135–145)

## 2022-03-05 MED ORDER — LANSOPRAZOLE 15 MG PO CPDR: 15.0000 mg | DELAYED_RELEASE_CAPSULE | Freq: Two times a day (BID) | ORAL | 3 refills | Status: DC

## 2022-03-05 MED ORDER — HYDRALAZINE HCL 25 MG PO TABS: 25.0000 mg | ORAL_TABLET | Freq: Two times a day (BID) | ORAL | 11 refills | Status: DC

## 2022-03-05 MED ORDER — ONDANSETRON 4 MG PO TBDP: 4.0000 mg | ORAL_TABLET | Freq: Three times a day (TID) | ORAL | 0 refills | Status: DC | PRN

## 2022-03-05 NOTE — Progress Notes (Signed)
Patient ID: Krystal [name], female   DOB: 11-30-1929, 88 y.o.   MRN: [id number]        Chief Complaint: follow up multiple issues including htn, low sodium, urinary frequency, chronic nausea, GERD, LBP as PCP not available today       HPI:  Krystal [name] is a 88 y.o. female here with c/o not being able to take the hydralazine as rx, but willing to take 25 mg bid (only taking one in am recently).  Pt denies chest pain, increased sob or doe, wheezing, orthopnea, PND, increased LE swelling, palpitations, dizziness or syncope.   Pt denies polydipsia, polyuria, or new focal neuro s/s, but did have recent hospn with low sodium, and has mild urinary frequency. Denies urinary symptoms such as dysuria, frequency, urgency, flank pain, hematuria d.    Pt denies fever, wt loss, night sweats, loss of appetite, or other constitutional symptoms  Pt continues to have recurring chronic bilateral LBP without sciatica, bowel or bladder change, fever, wt loss,  worsening LE pain/numbness/weakness, gait change or falls, has not tried tylenol ES but willing.  Also with mild worsening reflux not as well controlled with prevacid 15 qam, but no other abd pain, dysphagia, bowel change or blood.  Also has mild recurring nausea, asks for zofran refill as this helped.         Wt Readings from Last 3 Encounters:  03/05/22 153 lb (69.4 kg)  02/05/22 150 lb (68 kg)  01/29/22 153 lb 10.6 oz (69.7 kg)   BP Readings from Last 3 Encounters:  03/05/22 124/76  02/05/22 138/80  01/29/22 (!) 140/78         Past Medical History:  Diagnosis Date   Adenomatous colon polyp    Allergy    seasonal   Anemia    pt. denies   Anxiety    CAD (coronary artery disease)    Cataract    cataracts removed left eye.   Chronic lower back pain    Diverticulosis    GERD (gastroesophageal reflux disease)    Headache(784.0)    Hiatal hernia    Hyperlipidemia    Hypertension    IBS (irritable bowel syndrome) 10/13/2011   Osteoarthritis     Osteoporosis    Renal cysts, acquired, bilateral    SBO (small bowel obstruction) ([place]) 09/05/2015   Venous insufficiency    Past Surgical History:  Procedure Laterality Date   ABDOMINAL HYSTERECTOMY     BLADDER REPAIR     CARDIAC CATHETERIZATION  01/12/2002   90% stenosis of the left circumflex, stented with a 3x50m Cypher stent, resulting in reduction of 90% to 10%    CARDIOVASCULAR STRESS TEST  03/22/2007   Mild inferolateral thinning toward the apex without significant ischemia. Nondiagnostic electrocardiogram.   CATARACT EXTRACTION     bilateral   COLONOSCOPY  08/17/2008   adenomatous polyp, diverticulosis, external hemorrhoids   COLONOSCOPY W/ BIOPSIES     multiple    LEFT LOWER EXTREMITY VENOUS DOPPLER  06/18/2011   No evidence of left lower extremity DVT   TRANSTHORACIC ECHOCARDIOGRAM  11/18/2012   EF 484-66% LV systolic mild-moderately reduced, mild-moderate mitral valve regurg, mild-moderate tricuspid valve regurg. NSR-LBBB with occas PVCs   UPPER GASTROINTESTINAL ENDOSCOPY  07/03/2010   hiatal hernia   VARICOSE VEIN SURGERY      reports that she has never smoked. She has never used smokeless tobacco. She reports that she does not drink alcohol and does not use drugs. family history includes  Heart disease in her father; Stroke in her sister. Allergies  Allergen Reactions   Lipitor [Atorvastatin] Other (See Comments)    Myalgias    Zocor [Simvastatin] Other (See Comments)    Myalgias    Doxycycline     diarrhea   Fosamax [Alendronate] Other (See Comments)    Esophagitis   Levaquin [Levofloxacin] Diarrhea   Nexium [Esomeprazole] Nausea Only and Other (See Comments)    Stomach pains/burning sensation   Omnicef [Cefdinir] Diarrhea   Penicillins Hives   Pneumococcal Vaccine Other (See Comments)    Fatigue and aches that lasted 6+ months   Pneumovax [Pneumococcal Polysaccharide Vaccine]    Prilosec [Omeprazole] Nausea Only and Other (See Comments)    Stomach  pains/burning sensation   Norvasc [Amlodipine] Swelling    Ankle edema   Shellfish Allergy Hives, Nausea Only and Rash   Shellfish-Derived Products Hives, Nausea Only and Rash   Current Outpatient Medications on File Prior to Visit  Medication Sig Dispense Refill   acetaminophen (TYLENOL) 500 MG tablet Take 500 mg by mouth every 4 (four) hours as needed for mild pain or headache.     albuterol (VENTOLIN HFA) 108 (90 Base) MCG/ACT inhaler TAKE 2 PUFFS BY MOUTH EVERY 6 HOURS AS NEEDED FOR WHEEZE OR SHORTNESS OF BREATH (Patient taking differently: Inhale 2 puffs into the lungs every 6 (six) hours as needed for shortness of breath or wheezing.) 8.5 each 1   aspirin EC 81 MG tablet Take 81 mg by mouth daily.     Calcium Carbonate Antacid (CALCIUM CARBONATE PO) Take 1 tablet by mouth daily.     Cholecalciferol (VITAMIN D-3 PO) Take 1 capsule by mouth daily.     cycloSPORINE (RESTASIS) 0.05 % ophthalmic emulsion Place 1 drop into both eyes 2 (two) times daily.     docusate sodium (COLACE) 100 MG capsule Take 100 mg by mouth daily as needed (constipation).     fluticasone (FLONASE) 50 MCG/ACT nasal spray INSTILL 1 SPRAY INTO BOTH NOSTRILS DAILY (Patient taking differently: Place 1 spray into both nostrils daily.) 16 mL 5   loratadine (CLARITIN) 10 MG tablet TAKE 1 TABLET BY MOUTH EVERY DAY (Patient taking differently: Take 10 mg by mouth daily.) 90 tablet 3   LORazepam (ATIVAN) 0.5 MG tablet TAKE 1 TABLET BY MOUTH AT BEDTIME AS NEEDED FOR ANXIETY. (Patient taking differently: Take 0.5 mg by mouth at bedtime as needed for anxiety.) 90 tablet 1   losartan (COZAAR) 50 MG tablet TAKE 1 TABLET BY MOUTH TWICE A DAY 180 tablet 1   metoprolol succinate (TOPROL-XL) 50 MG 24 hr tablet Take 1 tablet (50 mg total) by mouth daily. Take with or immediately following a meal. 90 tablet 3   pregabalin (LYRICA) 25 MG capsule Take 1 capsule (25 mg total) by mouth at bedtime. 90 capsule 1   PREMARIN vaginal cream Place 1  Application vaginally See admin instructions. Apply vaginally 2-3 times a week as directed     rosuvastatin (CRESTOR) 10 MG tablet Take 1 tablet (10 mg total) by mouth daily. 90 tablet 1   tiZANidine (ZANAFLEX) 2 MG tablet Take 1 tablet (2 mg total) by mouth every 8 (eight) hours as needed for muscle spasms. Take mostly at bedtime 60 tablet 1   No current facility-administered medications on file prior to visit.        ROS:  All others reviewed and negative.  Objective        PE:  BP 124/76 (BP Location: Right Arm,  Patient Position: Sitting, Cuff Size: Large)   Pulse 67   Temp 98.2 F (36.8 C) (Oral)   Ht '5\' 5"'$  (1.651 m)   Wt 153 lb (69.4 kg)   SpO2 96%   BMI 25.46 kg/m                 Constitutional: Pt appears in NAD               HENT: Head: NCAT.                Right Ear: External ear normal.                 Left Ear: External ear normal.                Eyes: . Pupils are equal, round, and reactive to light. Conjunctivae and EOM are normal               Nose: without d/c or deformity               Neck: Neck supple. Gross normal ROM               Cardiovascular: Normal rate and regular rhythm.                 Pulmonary/Chest: Effort normal and breath sounds without rales or wheezing.                Abd:  Soft, NT, ND, + BS, no organomegaly               Neurological: Pt is alert. At baseline orientation, motor grossly intact               Skin: Skin is warm. No rashes, no other new lesions, LE edema - none               Psychiatric: Pt behavior is normal without agitation   Micro: none  Cardiac tracings I have personally interpreted today:  none  Pertinent Radiological findings (summarize): none   Lab Results  Component Value Date   WBC 7.6 03/05/2022   HGB 13.0 03/05/2022   HCT 39.1 03/05/2022   PLT 207.0 03/05/2022   GLUCOSE 87 03/05/2022   CHOL 139 06/26/2021   TRIG 101.0 06/26/2021   HDL 45.80 06/26/2021   LDLDIRECT 160.4 12/19/2008   LDLCALC 73 06/26/2021    ALT 13 03/05/2022   AST 19 03/05/2022   NA 138 03/05/2022   K 4.4 03/05/2022   CL 104 03/05/2022   CREATININE 0.89 03/05/2022   BUN 18 03/05/2022   CO2 29 03/05/2022   TSH 2.260 01/26/2022   HGBA1C 6.1 06/26/2021   Assessment/Plan:  Krystal [name] is a 88 y.o. White or Caucasian [1] female with  has a past medical history of Adenomatous colon polyp, Allergy, Anemia, Anxiety, CAD (coronary artery disease), Cataract, Chronic lower back pain, Diverticulosis, GERD (gastroesophageal reflux disease), Headache(784.0), Hiatal hernia, Hyperlipidemia, Hypertension, IBS (irritable bowel syndrome) (10/13/2011), Osteoarthritis, Osteoporosis, Renal cysts, acquired, bilateral, SBO (small bowel obstruction) ([place]) (09/05/2015), and Venous insufficiency.  Essential hypertension BP Readings from Last 3 Encounters:  03/05/22 124/76  02/05/22 138/80  01/29/22 (!) 140/78   Controlled here but uncontrolled at home, pt to continue medical treatment losartan 50 mg qd, toprol xl 50 qd, but also hydralazine at 25 mg bid    LOW BACK PAIN SYNDROME Exam benign, I suspect underlying lumbar DDD DJD, for tylenol ES qid prnm also for MRI  due to worsening pain and no prior imaging  Hyponatremia For f/u sodium per pt request  Urinary frequency Etiology unclear, for f/u urine Ua and culture  GERD (gastroesophageal reflux disease) With recent worsening, for increased prevacid 15 bid  Followup: Return in about 4 months (around 07/05/2022).  Cathlean Cower, MD 03/08/2022 2:17 PM [facility]

## 2022-03-05 NOTE — Patient Instructions (Addendum)
Ok to try the Tylenol ES extra strength as needed for pain, and try to limit the Advil unless really needed  Ok to increase the hydralazine 25 mg twice per day  Ok to increase the prevacid to 15 mg twice per day  Please continue all other medications as before, and refills have been done if requested - zofran  Please have the pharmacy call with any other refills you may need.  Please continue your efforts at being more active, low cholesterol diet, and weight control.  Please keep your appointments with your specialists as you may have planned  You will be contacted regarding the referral for: MRI for the lower back  Please go to the LAB at the blood drawing area for the tests to be done  You will be contacted by phone if any changes need to be made immediately.  Otherwise, you will receive a letter about your results with an explanation, but please check with MyChart first.  Please remember to sign up for MyChart if you have not done so, as this will be important to you in the future with finding out test results, communicating by private email, and scheduling acute appointments online when needed.  Please make an Appointment to return in 4 months, or sooner if needed

## 2022-03-05 NOTE — Telephone Encounter (Signed)
Pt was a no show for final PT visit on 03/05/22.  Spoke to Pt's husband and he was under the impression she had cancelled all visits since she was feeling better.  PT will plan to d/c Pt.  Emanuell Morina, PT 03/05/22 10:47 AM

## 2022-03-05 NOTE — Therapy (Incomplete)
OUTPATIENT PHYSICAL THERAPY TREATMENT NOTE   Patient Name: Krystal [name] MRN: [id number] DOB:08/12/1929, 88 y.o., female Today's Date: 03/05/2022  PCP: Janith Lima, MD REFERRING PROVIDER: Gregor Hams, MD   END OF SESSION:    Past Medical History:  Diagnosis Date   Adenomatous colon polyp    Allergy    seasonal   Anemia    pt. denies   Anxiety    CAD (coronary artery disease)    Cataract    cataracts removed left eye.   Chronic lower back pain    Diverticulosis    GERD (gastroesophageal reflux disease)    Headache(784.0)    Hiatal hernia    Hyperlipidemia    Hypertension    IBS (irritable bowel syndrome) 10/13/2011   Osteoarthritis    Osteoporosis    Renal cysts, acquired, bilateral    SBO (small bowel obstruction) ([place]) 09/05/2015   Venous insufficiency    Past Surgical History:  Procedure Laterality Date   ABDOMINAL HYSTERECTOMY     BLADDER REPAIR     CARDIAC CATHETERIZATION  01/12/2002   90% stenosis of the left circumflex, stented with a 3x54m Cypher stent, resulting in reduction of 90% to 10%    CARDIOVASCULAR STRESS TEST  03/22/2007   Mild inferolateral thinning toward the apex without significant ischemia. Nondiagnostic electrocardiogram.   CATARACT EXTRACTION     bilateral   COLONOSCOPY  08/17/2008   adenomatous polyp, diverticulosis, external hemorrhoids   COLONOSCOPY W/ BIOPSIES     multiple    LEFT LOWER EXTREMITY VENOUS DOPPLER  06/18/2011   No evidence of left lower extremity DVT   TRANSTHORACIC ECHOCARDIOGRAM  11/18/2012   EF 476-19% LV systolic mild-moderately reduced, mild-moderate mitral valve regurg, mild-moderate tricuspid valve regurg. NSR-LBBB with occas PVCs   UPPER GASTROINTESTINAL ENDOSCOPY  07/03/2010   hiatal hernia   VARICOSE VEIN SURGERY     Patient Active Problem List   Diagnosis Date Noted   Chronic systolic [place]est[place] 01/28/[place]   UTI (urinary tract infection) 01/27/2022   Hyponatremia 01/26/2022    Laceration of occipital scalp 11/08/2021   Encounter for general adult medical examination with abnormal findings 05/08/2020   Primary osteoarthritis of first carpometacarpal joint of right hand 05/08/2020   Restless legs syndrome (RLS) 08/22/2019   Chronic peripheral neuropathic pain 08/22/2019   Chronic renal disease, stage 3, moderately decreased glomerular filtration rate (GFR) between 30-59 mL/min/1.73 square meter (HCC) 11/08/2018   Chronic idiopathic constipation 10/19/2017   LBBB (left bundle branch block) 07/16/2016   Cardiomyopathy ([place] 07/16/2016   Benign paroxysmal positional vertigo 01/17/2016   Prediabetes 05/23/2015   IBS (irritable bowel syndrome) 10/13/2011   GERD (gastroesophageal reflux disease) 06/25/2010   Cough 03/09/2008   RENAL CYST 11/18/2007   OSTEOPOROSIS 11/18/2007   Allergic rhinitis 10/06/2007   Hyperlipidemia with target LDL less than 70 07/19/2007   GAD (generalized anxiety disorder) 07/19/2007   Essential hypertension 07/19/2007   CAD S/P percutaneous coronary angioplasty 07/19/2007   VENOUS INSUFFICIENCY 07/19/2007   Osteoarthritis 07/19/2007   LOW BACK PAIN SYNDROME 07/19/2007    REFERRING DIAG: S29.012A (ICD-10-CM) - Strain of rhomboid muscle, initial encounter   THERAPY DIAG:  No diagnosis found.  Rationale for Evaluation and Treatment Rehabilitation  PERTINENT HISTORY: osteoporosis, pt had picked up a heavy object on Monday, 10/16. Pt locates pain to around her R scapula. Pt was seen at urgent care on 10/22   PRECAUTIONS: osteoporosis  SUBJECTIVE:  SUBJECTIVE STATEMENT:  ***   PAIN:  Are you having pain? Yes NPRS scale: 5/10 Pain location: Rt intrascapular Pain orientation: Right  PAIN TYPE: aching and dull Pain description: constant and dull   Aggravating factors: constant toothache Relieving factors: meds, heat  LIVING ENVIRONMENT: Lives with: lives with their spouse Lives in: House/apartment Stairs: No Has following equipment at home: None   OCCUPATION: retired   PLOF: Independent   PATIENT GOALS: pain relief  OBJECTIVE: (objective measures completed at initial evaluation unless otherwise dated)  DIAGNOSTIC FINDINGS:  none   PATIENT SURVEYS:  FOTO 61% goal 70%   COGNITION: Overall cognitive status: Within functional limits for tasks assessed   SENSATION: WFL   POSTURE: forward head and increased thoracic kyphosis   PALPATION: Rhomboid on Rt, spasm present, tender to moderate pressure, slight tenderness in Rt levator        CERVICAL ROM:    Active ROM A/PROM (deg) eval  Flexion 45  Extension 40  Right lateral flexion 40  Left lateral flexion 40  Right rotation 60  Left rotation 60   (Blank rows = not tested)   UPPER EXTREMITY ROM: WFL bil without pain   UPPER EXTREMITY MMT: 4/5 grossly bil UE without pain on testing   CERVICAL SPECIAL TESTS:      FUNCTIONAL TESTS:        TODAY'S TREATMENT:                                                                                                                         DATE:   01/22/22 (EVAL):  Issued HEP, advised continued use of heat  03/05/22      PATIENT EDUCATION:  Education details: Access Code: [email or web address] Person educated: Patient Education method: Explanation, Demonstration, Verbal cues, and Handouts Education comprehension: verbalized understanding and returned demonstration   HOME EXERCISE PROGRAM: Access Code: [email or web address] URL: [email or web address] Date: 01/22/2022 Prepared by: Venetia Night Shirely Toren   Exercises - Baby Eagle   - 3 x daily - 7 x weekly - 1 sets - 1 reps - 20 sec hold - Gentle Levator Scapulae Stretch  - 3 x daily - 7 x weekly - 1 sets - 1 reps - 20 sec hold - Seated Scapular Retraction  - 3 x daily - 7  x weekly - 1 sets - 10 reps - Seated Shoulder Shrug Circles AROM Backward  - 3 x daily - 7 x weekly - 1 sets - 10 reps   ASSESSMENT:   CLINICAL IMPRESSION: Patient is a 88 y.o. female who was seen today for physical therapy evaluation and treatment for Rt intrascapular pain which started mid-Oct after picking up a heavy case of water.  Symptoms are improving over time.  Pt ROM WFL for neck and bil UE without pain.  Pt able to perform MMT for bil UE without pain, 4/5 grossly.  Pt now able to tolerate moderate pressure palpation over Rt rhomboid which appears to be pain source.  HEP issued for  stretching and gentle mobility which was well tolerated during evaluation.  Pt will benefit from skilled PT to progress healing and recovery and maximize return to daily activities.   OBJECTIVE IMPAIRMENTS: decreased activity tolerance, decreased mobility, decreased ROM, decreased strength, increased fascial restrictions, increased muscle spasms, impaired flexibility, impaired UE functional use, improper body mechanics, postural dysfunction, and pain.    ACTIVITY LIMITATIONS: carrying, lifting, sleeping, bed mobility, bathing, dressing, and reach over head   PARTICIPATION LIMITATIONS: meal prep, cleaning, laundry, driving, shopping, community activity, and yard work   PERSONAL FACTORS: Age are also affecting patient's functional outcome.    REHAB POTENTIAL: Good   CLINICAL DECISION MAKING: Stable/uncomplicated   EVALUATION COMPLEXITY: Low     GOALS: Goals reviewed with patient? Yes   SHORT TERM GOALS: Target date: 02/19/2022    Pt will be ind with initial HEP without exacerbation of symptoms. Baseline:  Goal status: INITIAL   2.  Pt will be able to perform light lifting with good body mechanics without pain Baseline:  Goal status: INITIAL   3.  Pt will report pain reduction to no greater than 3/10 with daily activities. Baseline:  Goal status: INITIAL       LONG TERM GOALS: Target date:  03/19/22   Pt will be ind with advanced HEP and understand prevention of future injury with daily tasks. Baseline:  Goal status: INITIAL   2.  FOTO score will improve to at least 70% to demo improved function. Baseline: 61% Goal status: INITIAL   3.  Pt will report pain no greater than 2/10 with daily activities. Baseline:  Goal status: INITIAL      PLAN:   PT FREQUENCY: 1x/week   PT DURATION: 6 weeks   PLANNED INTERVENTIONS: Therapeutic exercises, Therapeutic activity, Neuromuscular re-education, Patient/Family education, Self Care, Joint mobilization, Dry Needling, Electrical stimulation, Spinal mobilization, Moist heat, Ionotophoresis '4mg'$ /ml Dexamethasone, and Manual therapy   PLAN FOR NEXT SESSION: review HEP and incorporate light resistance bands for row, ext, ER, horiz abd as tol, STM Rt rhomboid, moist heat, body mechanics for lifting/carrying    Krystal [name], PT 03/05/2022, 7:40 AM

## 2022-03-06 LAB — URINE CULTURE

## 2022-03-07 ENCOUNTER — Ambulatory Visit: Payer: PPO | Admitting: Orthopedic Surgery

## 2022-03-08 ENCOUNTER — Encounter: Payer: Self-pay | Admitting: Internal Medicine

## 2022-03-08 DIAGNOSIS — R350 Frequency of micturition: Secondary | ICD-10-CM | POA: Insufficient documentation

## 2022-03-08 NOTE — Assessment & Plan Note (Signed)
For f/u sodium per pt request

## 2022-03-08 NOTE — Assessment & Plan Note (Signed)
Etiology unclear, for f/u urine Ua and culture

## 2022-03-08 NOTE — Assessment & Plan Note (Addendum)
Exam benign, I suspect underlying lumbar DDD DJD, for tylenol ES qid prnm also for MRI due to worsening pain and no prior imaging

## 2022-03-08 NOTE — Assessment & Plan Note (Signed)
BP Readings from Last 3 Encounters:  03/05/22 124/76  02/05/22 138/80  01/29/22 (!) 140/78   Controlled here but uncontrolled at home, pt to continue medical treatment losartan 50 mg qd, toprol xl 50 qd, but also hydralazine at 25 mg bid

## 2022-03-08 NOTE — Assessment & Plan Note (Signed)
With recent worsening, for increased prevacid 15 bid

## 2022-03-18 ENCOUNTER — Other Ambulatory Visit: Payer: Self-pay | Admitting: Internal Medicine

## 2022-03-18 DIAGNOSIS — E785 Hyperlipidemia, unspecified: Secondary | ICD-10-CM

## 2022-03-18 DIAGNOSIS — I2510 Atherosclerotic heart disease of native coronary artery without angina pectoris: Secondary | ICD-10-CM

## 2022-03-20 DIAGNOSIS — M25511 Pain in right shoulder: Secondary | ICD-10-CM | POA: Diagnosis not present

## 2022-03-20 DIAGNOSIS — M5030 Other cervical disc degeneration, unspecified cervical region: Secondary | ICD-10-CM | POA: Diagnosis not present

## 2022-03-20 DIAGNOSIS — S43491D Other sprain of right shoulder joint, subsequent encounter: Secondary | ICD-10-CM | POA: Diagnosis not present

## 2022-03-21 ENCOUNTER — Encounter (HOSPITAL_BASED_OUTPATIENT_CLINIC_OR_DEPARTMENT_OTHER): Payer: Self-pay | Admitting: Emergency Medicine

## 2022-03-21 ENCOUNTER — Other Ambulatory Visit: Payer: Self-pay

## 2022-03-21 ENCOUNTER — Telehealth: Payer: Self-pay | Admitting: *Deleted

## 2022-03-21 DIAGNOSIS — M5412 Radiculopathy, cervical region: Secondary | ICD-10-CM | POA: Diagnosis not present

## 2022-03-21 DIAGNOSIS — M542 Cervicalgia: Secondary | ICD-10-CM | POA: Diagnosis not present

## 2022-03-21 DIAGNOSIS — Z79899 Other long term (current) drug therapy: Secondary | ICD-10-CM | POA: Diagnosis not present

## 2022-03-21 DIAGNOSIS — Z7982 Long term (current) use of aspirin: Secondary | ICD-10-CM | POA: Insufficient documentation

## 2022-03-21 DIAGNOSIS — M62838 Other muscle spasm: Secondary | ICD-10-CM | POA: Diagnosis not present

## 2022-03-21 NOTE — Telephone Encounter (Signed)
Patient states she got a x ray of her neck yesterday at the orthopedic office in [place]. Patient was seen at the Urgent care. Patient states she only is taking tylenol. Patient is requesting something else for her neck pain.   Please advise

## 2022-03-21 NOTE — ED Triage Notes (Signed)
Pt c/o right shoulder and arm pain x 2 days. Pt states that she saw ortho and was given '800mg'$  ibuprofen without relief. Pt states that she had xrays done and was told the pain was coming from her neck.

## 2022-03-22 ENCOUNTER — Emergency Department (HOSPITAL_BASED_OUTPATIENT_CLINIC_OR_DEPARTMENT_OTHER)
Admission: EM | Admit: 2022-03-22 | Discharge: 2022-03-22 | Disposition: A | Discharge status: Home / Self Care | Payer: PPO | Attending: Emergency Medicine | Admitting: Emergency Medicine

## 2022-03-22 DIAGNOSIS — M5412 Radiculopathy, cervical region: Secondary | ICD-10-CM

## 2022-03-22 MED ORDER — CYCLOBENZAPRINE HCL 5 MG PO TABS
5.0000 mg | ORAL_TABLET | Freq: Once | ORAL | Status: AC
  Administered 2022-03-22: 5 mg via ORAL
  Filled 2022-03-22: qty 1

## 2022-03-22 MED ORDER — CYCLOBENZAPRINE HCL 5 MG PO TABS: 10.0000 mg | ORAL_TABLET | Freq: Two times a day (BID) | ORAL | 0 refills | Status: AC | PRN

## 2022-03-22 MED ORDER — KETOROLAC TROMETHAMINE 15 MG/ML IJ SOLN
15.0000 mg | Freq: Once | INTRAMUSCULAR | Status: AC
  Administered 2022-03-22: 15 mg via INTRAMUSCULAR
  Filled 2022-03-22: qty 1

## 2022-03-22 NOTE — ED Notes (Signed)
Pt verbalized understanding of d/c instructions, meds, and followup care. Denies questions. VSS, no distress noted. Steady gait to exit with all belongings.  ?

## 2022-03-22 NOTE — ED Provider Notes (Signed)
[facility]  Provider Note  CSN: [id number] Arrival date & time: 03/21/22 2241  History Chief Complaint  Patient presents with   Shoulder Pain    Krystal [name] is a 88 y.o. female presents for evaluation of three days of R shoulder/neck pain. She had initially had shoulder pain about a month ago after lifting something heavy, saw UC and Ortho and was on a course of steroids which helped. Xrays of shoulder were neg. In the last few days, the pain has returned with no clear cause. It starts in her R neck/scapular area and radiates down her arm to her elbow. She is able to move the arm without difficulty. No weakness or numbness. She went back to UC this week and was told the pain was probably coming from her neck. She has been taking Motrin and APAP as directed without much improvement. Has not been able to sleep due to pain.    Home Medications Prior to Admission medications   Medication Sig Start Date End Date Taking? Authorizing Provider  cyclobenzaprine (FLEXERIL) 5 MG tablet Take 2 tablets (10 mg total) by mouth 2 (two) times daily as needed for up to 10 days for muscle spasms. 03/22/22 04/01/22 Yes Truddie Hidden, MD  acetaminophen (TYLENOL) 500 MG tablet Take 500 mg by mouth every 4 (four) hours as needed for mild pain or headache.    [provider]  albuterol (VENTOLIN HFA) 108 (90 Base) MCG/ACT inhaler TAKE 2 PUFFS BY MOUTH EVERY 6 HOURS AS NEEDED FOR WHEEZE OR SHORTNESS OF BREATH Patient taking differently: Inhale 2 puffs into the lungs every 6 (six) hours as needed for shortness of breath or wheezing. 09/12/21   Janith Lima, MD  aspirin EC 81 MG tablet Take 81 mg by mouth daily.    [provider]  Calcium Carbonate Antacid (CALCIUM CARBONATE PO) Take 1 tablet by mouth daily.    [provider]  Cholecalciferol (VITAMIN D-3 PO) Take 1 capsule by mouth daily.    [provider]  cycloSPORINE (RESTASIS) 0.05 %  ophthalmic emulsion Place 1 drop into both eyes 2 (two) times daily.    [provider]  docusate sodium (COLACE) 100 MG capsule Take 100 mg by mouth daily as needed (constipation).    [provider]  fluticasone (FLONASE) 50 MCG/ACT nasal spray INSTILL 1 SPRAY INTO BOTH NOSTRILS DAILY Patient taking differently: Place 1 spray into both nostrils daily. 01/23/22   Janith Lima, MD  hydrALAZINE (APRESOLINE) 25 MG tablet Take 1 tablet (25 mg total) by mouth in the morning and at bedtime. 03/05/22   Biagio Borg, MD  lansoprazole (PREVACID) 15 MG capsule Take 1 capsule (15 mg total) by mouth 2 (two) times daily before a meal. 03/05/22   Biagio Borg, MD  loratadine (CLARITIN) 10 MG tablet TAKE 1 TABLET BY MOUTH EVERY DAY Patient taking differently: Take 10 mg by mouth daily. 08/05/21   Janith Lima, MD  LORazepam (ATIVAN) 0.5 MG tablet TAKE 1 TABLET BY MOUTH AT BEDTIME AS NEEDED FOR ANXIETY. Patient taking differently: Take 0.5 mg by mouth at bedtime as needed for anxiety. 12/24/21   Janith Lima, MD  losartan (COZAAR) 50 MG tablet TAKE 1 TABLET BY MOUTH TWICE A DAY 02/09/22   Janith Lima, MD  metoprolol succinate (TOPROL-XL) 50 MG 24 hr tablet Take 1 tablet (50 mg total) by mouth daily. Take with or immediately following a meal. 02/05/22  Biagio Borg, MD  ondansetron (ZOFRAN-ODT) 4 MG disintegrating tablet Take 1 tablet (4 mg total) by mouth every 8 (eight) hours as needed for nausea or vomiting. 03/05/22   Biagio Borg, MD  pregabalin (LYRICA) 25 MG capsule Take 1 capsule (25 mg total) by mouth at bedtime. 02/05/22   Biagio Borg, MD  PREMARIN vaginal cream Place 1 Application vaginally See admin instructions. Apply vaginally 2-3 times a week as directed 12/20/18   [provider]  rosuvastatin (CRESTOR) 10 MG tablet TAKE 1 TABLET BY MOUTH EVERY DAY 03/18/22   Janith Lima, MD     Allergies    Lipitor [atorvastatin], Zocor [simvastatin], Doxycycline,  Fosamax [alendronate], Levaquin [levofloxacin], Nexium [esomeprazole], Omnicef [cefdinir], Penicillins, Pneumococcal vaccine, Pneumovax [pneumococcal polysaccharide vaccine], Prilosec [omeprazole], Norvasc [amlodipine], Shellfish allergy, and Shellfish-derived products   Review of Systems   Review of Systems Please see HPI for pertinent positives and negatives  Physical Exam BP (!) 177/73   Pulse 69   Temp 98.2 F (36.8 C) (Oral)   Resp 18   Ht '5\' 5"'$  (1.651 m)   Wt 70 kg   SpO2 97%   BMI 25.68 kg/m   Physical Exam Vitals and nursing note reviewed.  Constitutional:      Appearance: Normal appearance.  HENT:     Head: Normocephalic and atraumatic.     Nose: Nose normal.     Mouth/Throat:     Mouth: Mucous membranes are moist.  Eyes:     Extraocular Movements: Extraocular movements intact.     Conjunctiva/sclera: Conjunctivae normal.  Cardiovascular:     Rate and Rhythm: Normal rate.     Pulses: Normal pulses.  Pulmonary:     Effort: Pulmonary effort is normal.     Breath sounds: Normal breath sounds.  Abdominal:     General: Abdomen is flat.     Palpations: Abdomen is soft.     Tenderness: There is no abdominal tenderness.  Musculoskeletal:        General: Tenderness (R paraspinal cervical and rhomboid muscles) present. No swelling. Normal range of motion.     Cervical back: Neck supple.  Skin:    General: Skin is warm and dry.  Neurological:     General: No focal deficit present.     Mental Status: She is alert.     Cranial Nerves: No cranial nerve deficit.     Sensory: No sensory deficit.     Motor: No weakness.  Psychiatric:        Mood and Affect: Mood normal.     ED Results / Procedures / Treatments   EKG None  Procedures Procedures  Medications Ordered in the ED Medications  ketorolac (TORADOL) 15 MG/ML injection 15 mg (15 mg Intramuscular Given 03/22/22 0144)  cyclobenzaprine (FLEXERIL) tablet 5 mg (5 mg Oral Given 03/22/22 0144)    Initial  Impression and Plan  Patient here with MSK R shoulder/arm pain, most consistent with a cervical radiculopathy. Discussed IM toradol and flexeril which patient is amenable to. She is aware muscle relaxers may make her drowsy or unsteady and will take them with caution. PCP follow up.   ED Course   Clinical Course as of 03/22/22 0240  Sat Mar 22, 2022  0237 Patient reports significant improvement and she is eager to go home. Will Rx Flexeril '5mg'$ , again cautioned on side effects. She states she will only take it at night. Husband at bedside will help her if needed. PCP and Ortho  follow up.  [CS]    Clinical Course User Index [CS] Truddie Hidden, MD     MDM Rules/Calculators/A&P Medical Decision Making Problems Addressed: Cervical radiculopathy: acute illness or injury  Risk Prescription drug management.    Final Clinical Impression(s) / ED Diagnoses Final diagnoses:  Cervical radiculopathy    Rx / DC Orders ED Discharge Orders          Ordered    cyclobenzaprine (FLEXERIL) 5 MG tablet  2 times daily PRN        03/22/22 0239             Truddie Hidden, MD 03/22/22 [phone]

## 2022-03-26 DIAGNOSIS — M9901 Segmental and somatic dysfunction of cervical region: Secondary | ICD-10-CM | POA: Diagnosis not present

## 2022-03-26 DIAGNOSIS — M9907 Segmental and somatic dysfunction of upper extremity: Secondary | ICD-10-CM | POA: Diagnosis not present

## 2022-03-26 DIAGNOSIS — M7541 Impingement syndrome of right shoulder: Secondary | ICD-10-CM | POA: Diagnosis not present

## 2022-03-26 DIAGNOSIS — M9902 Segmental and somatic dysfunction of thoracic region: Secondary | ICD-10-CM | POA: Diagnosis not present

## 2022-03-27 DIAGNOSIS — M9902 Segmental and somatic dysfunction of thoracic region: Secondary | ICD-10-CM | POA: Diagnosis not present

## 2022-03-27 DIAGNOSIS — M9907 Segmental and somatic dysfunction of upper extremity: Secondary | ICD-10-CM | POA: Diagnosis not present

## 2022-03-27 DIAGNOSIS — M7541 Impingement syndrome of right shoulder: Secondary | ICD-10-CM | POA: Diagnosis not present

## 2022-03-27 DIAGNOSIS — M9901 Segmental and somatic dysfunction of cervical region: Secondary | ICD-10-CM | POA: Diagnosis not present

## 2022-03-29 ENCOUNTER — Encounter (HOSPITAL_COMMUNITY): Payer: Self-pay

## 2022-03-29 ENCOUNTER — Emergency Department (HOSPITAL_COMMUNITY)
Admission: EM | Admit: 2022-03-29 | Discharge: 2022-03-29 | Disposition: A | Discharge status: Home / Self Care | Payer: PPO | Attending: Emergency Medicine | Admitting: Emergency Medicine

## 2022-03-29 ENCOUNTER — Emergency Department (HOSPITAL_COMMUNITY): Payer: PPO

## 2022-03-29 DIAGNOSIS — M791 Myalgia, unspecified site: Secondary | ICD-10-CM | POA: Diagnosis not present

## 2022-03-29 DIAGNOSIS — M542 Cervicalgia: Secondary | ICD-10-CM | POA: Diagnosis not present

## 2022-03-29 DIAGNOSIS — M47812 Spondylosis without myelopathy or radiculopathy, cervical region: Secondary | ICD-10-CM | POA: Diagnosis not present

## 2022-03-29 DIAGNOSIS — Z7982 Long term (current) use of aspirin: Secondary | ICD-10-CM | POA: Insufficient documentation

## 2022-03-29 DIAGNOSIS — M7918 Myalgia, other site: Secondary | ICD-10-CM | POA: Diagnosis not present

## 2022-03-29 MED ORDER — HYDROCODONE-ACETAMINOPHEN 5-325 MG PO TABS: 1.0000 | ORAL_TABLET | ORAL | 0 refills | Status: DC | PRN

## 2022-03-29 MED ORDER — OXYCODONE-ACETAMINOPHEN 5-325 MG PO TABS
1.0000 | ORAL_TABLET | Freq: Once | ORAL | Status: AC
  Administered 2022-03-29: 1 via ORAL
  Filled 2022-03-29: qty 1

## 2022-03-29 NOTE — ED Triage Notes (Signed)
Pt presents with c/o right side neck pain into her right shoulder blade and down her right arm. Pt reports the pain has been present for approx 3 weeks. Pt has been seen by her MD and has had x-rays done and reports that she is unable to get into an MRI until February. Pt has tried home remedies with no relief.

## 2022-03-29 NOTE — ED Provider Notes (Signed)
[facility]-EMERGENCY DEPT Provider Note   CSN: [id number] Arrival date & time: 03/29/22  4403     History  Chief Complaint  Patient presents with   Neck Pain    Krystal [name] is a 88 y.o. female.  88 year old female brought in by family for pain in the right trapezius region radiating down right arm near elbow area, onset 3 weeks ago without preceding fall or injury. Pain is not worse with ROM/movement. Denies weakness in the hand/arm, numbness. Patient went to ortho, had xrays and was told to take IUB 800mg . Patient did not have any relief with IBU, is now scheduled for MRI 04/12/22 and will start PT in the coming weeks but is in too much pain to wait. No other complaints or concerns today. Does report relief with percocet provided in triage on arrival.        Home Medications Prior to Admission medications   Medication Sig Start Date End Date Taking? Authorizing Provider  HYDROcodone-acetaminophen (NORCO/VICODIN) 5-325 MG tablet Take 1 tablet by mouth every 4 (four) hours as needed. 03/29/22  Yes Jeannie Fend, PA-C  acetaminophen (TYLENOL) 500 MG tablet Take 500 mg by mouth every 4 (four) hours as needed for mild pain or headache.    [provider]  albuterol (VENTOLIN HFA) 108 (90 Base) MCG/ACT inhaler TAKE 2 PUFFS BY MOUTH EVERY 6 HOURS AS NEEDED FOR WHEEZE OR SHORTNESS OF BREATH Patient taking differently: Inhale 2 puffs into the lungs every 6 (six) hours as needed for shortness of breath or wheezing. 09/12/21   Etta Grandchild, MD  aspirin EC 81 MG tablet Take 81 mg by mouth daily.    [provider]  Calcium Carbonate Antacid (CALCIUM CARBONATE PO) Take 1 tablet by mouth daily.    [provider]  Cholecalciferol (VITAMIN D-3 PO) Take 1 capsule by mouth daily.    [provider]  cyclobenzaprine (FLEXERIL) 5 MG tablet Take 2 tablets (10 mg total) by mouth 2 (two) times daily as needed for up to 10 days for muscle  spasms. 03/22/22 04/01/22  Pollyann Savoy, MD  cycloSPORINE (RESTASIS) 0.05 % ophthalmic emulsion Place 1 drop into both eyes 2 (two) times daily.    [provider]  docusate sodium (COLACE) 100 MG capsule Take 100 mg by mouth daily as needed (constipation).    [provider]  fluticasone (FLONASE) 50 MCG/ACT nasal spray INSTILL 1 SPRAY INTO BOTH NOSTRILS DAILY Patient taking differently: Place 1 spray into both nostrils daily. 01/23/22   Etta Grandchild, MD  hydrALAZINE (APRESOLINE) 25 MG tablet Take 1 tablet (25 mg total) by mouth in the morning and at bedtime. 03/05/22   Corwin Levins, MD  lansoprazole (PREVACID) 15 MG capsule Take 1 capsule (15 mg total) by mouth 2 (two) times daily before a meal. 03/05/22   Corwin Levins, MD  loratadine (CLARITIN) 10 MG tablet TAKE 1 TABLET BY MOUTH EVERY DAY Patient taking differently: Take 10 mg by mouth daily. 08/05/21   Etta Grandchild, MD  LORazepam (ATIVAN) 0.5 MG tablet TAKE 1 TABLET BY MOUTH AT BEDTIME AS NEEDED FOR ANXIETY. Patient taking differently: Take 0.5 mg by mouth at bedtime as needed for anxiety. 12/24/21   Etta Grandchild, MD  losartan (COZAAR) 50 MG tablet TAKE 1 TABLET BY MOUTH TWICE A DAY 02/09/22   Etta Grandchild, MD  metoprolol succinate (TOPROL-XL) 50 MG 24 hr tablet Take 1 tablet (50 mg  total) by mouth daily. Take with or immediately following a meal. 02/05/22   Corwin Levins, MD  ondansetron (ZOFRAN-ODT) 4 MG disintegrating tablet Take 1 tablet (4 mg total) by mouth every 8 (eight) hours as needed for nausea or vomiting. 03/05/22   Corwin Levins, MD  pregabalin (LYRICA) 25 MG capsule Take 1 capsule (25 mg total) by mouth at bedtime. 02/05/22   Corwin Levins, MD  PREMARIN vaginal cream Place 1 Application vaginally See admin instructions. Apply vaginally 2-3 times a week as directed 12/20/18   [provider]  rosuvastatin (CRESTOR) 10 MG tablet TAKE 1 TABLET BY MOUTH EVERY DAY 03/18/22   Etta Grandchild, MD       Allergies    Lipitor [atorvastatin], Zocor [simvastatin], Doxycycline, Fosamax [alendronate], Levaquin [levofloxacin], Nexium [esomeprazole], Omnicef [cefdinir], Penicillins, Pneumococcal vaccine, Pneumovax [pneumococcal polysaccharide vaccine], Prilosec [omeprazole], Norvasc [amlodipine], Shellfish allergy, and Shellfish-derived products    Review of Systems   Review of Systems Negative except as per HPI Physical Exam Updated Vital Signs BP (!) 187/77 (BP Location: Left Arm)   Pulse 68   Temp 98.1 F (36.7 C) (Oral)   Resp 16   SpO2 97%  Physical Exam Vitals and nursing note reviewed.  Constitutional:      General: She is not in acute distress.    Appearance: She is well-developed. She is not diaphoretic.  HENT:     Head: Normocephalic and atraumatic.  Cardiovascular:     Pulses: Normal pulses.  Pulmonary:     Effort: Pulmonary effort is normal.  Musculoskeletal:        General: Tenderness present. No swelling, deformity or signs of injury. Normal range of motion.       Arms:     Cervical back: Neck supple. No tenderness or bony tenderness. No pain with movement.     Comments: TTP right trapezius area with palpable muscle spasm along the medial border of the scapula. No midline neck or t-spine tenderness.   Skin:    General: Skin is warm and dry.     Findings: No erythema or rash.  Neurological:     Mental Status: She is alert and oriented to person, place, and time.     Sensory: No sensory deficit.     Motor: No weakness.     Comments: Equal grip strength   Psychiatric:        Behavior: Behavior normal.     ED Results / Procedures / Treatments   Labs (all labs ordered are listed, but only abnormal results are displayed) Labs Reviewed - No data to display  EKG None  Radiology CT Cervical Spine Wo Contrast  Result Date: 03/29/2022 CLINICAL DATA:  88 year old female with history of chronic neck pain. EXAM: CT CERVICAL SPINE WITHOUT CONTRAST TECHNIQUE:  Multidetector CT imaging of the cervical spine was performed without intravenous contrast. Multiplanar CT image reconstructions were also generated. RADIATION DOSE REDUCTION: This exam was performed according to the departmental dose-optimization program which includes automated exposure control, adjustment of the mA and/or kV according to patient size and/or use of iterative reconstruction technique. COMPARISON:  No priors. FINDINGS: Alignment: Normal. Skull base and vertebrae: No acute fracture. No primary bone lesion or focal pathologic process. Soft tissues and spinal canal: No prevertebral fluid or swelling. No visible canal hematoma. Disc levels: Mild multilevel degenerative disc disease, most evident at C4-C5 and C5-C6. Moderate multilevel facet arthropathy. Upper chest: Unremarkable. Other: None. IMPRESSION: 1. No acute abnormality of the cervical spine  to account for the patient's symptoms. 2. Multilevel degenerative disc disease and cervical spondylosis, as above. Electronically Signed   By: Trudie Reed M.D.   On: 03/29/2022 10:00    Procedures Procedures    Medications Ordered in ED Medications  oxyCODONE-acetaminophen (PERCOCET/ROXICET) 5-325 MG per tablet 1 tablet (1 tablet Oral Given 03/29/22 1610)    ED Course/ Medical Decision Making/ A&P                           Medical Decision Making Risk Prescription drug management.   88 year old female presents with complaint of pain in her right shoulder area as above.  On exam, she is found have tenderness along the right trapezius area extending across the top of her shoulder, down the medial border of the scapula as well as into her right axillary area,/subscapular region.  She has a palpable muscle spasm along the medial border of the right scapula.  Sensation intact to upper extremities and symmetric, grip strength symmetric, radial pulses present. CT C-spine as ordered in triage and reviewed shows arthritic changes without acute  injury. Discussed results with patient.  She has had relief with the Percocet provided in triage, will discharge with narcotic pain medication although advised this can cause constipation and fall risk and to use caution.  She can continue with lidocaine patches.  Advised to discontinue the 800 mg ibuprofen as she is 88 years old and concern for GI bleed/kidney impact if continued at this dose. Recommend follow up with PT as planned, consider massage or area as well.         Final Clinical Impression(s) / ED Diagnoses Final diagnoses:  Musculoskeletal pain    Rx / DC Orders ED Discharge Orders          Ordered    HYDROcodone-acetaminophen (NORCO/VICODIN) 5-325 MG tablet  Every 4 hours PRN        03/29/22 1413              Jeannie Fend, PA-C 03/29/22 1438    Margarita Grizzle, MD 03/30/22 [id number]

## 2022-03-29 NOTE — Discharge Instructions (Addendum)
Norco as needed as prescribed for pain. Lidocaine patch applied today. These are available over the counter. Norco can cause constipation, take a colace if taking this medication.  Follow up with physical therapy as planned. Consider follow up with massage therapy.   STOP ibuprofen.

## 2022-03-29 NOTE — ED Provider Triage Note (Cosign Needed)
Emergency Medicine Provider Triage Evaluation Note  Krystal [name] , a 88 y.o. female  was evaluated in triage.  Pt complains of neck pain radiating down right arm, has been going on for around 3 weeks, no recent injury, pain worsening, severity 10/10.  She denies any numbness, weakness, groin tingling, numbness.  She denies any previous surgery of the cervical spine.  She reports that she has an MRI scheduled for 1/13 to further evaluate the pain.  Review of Systems  Positive: Neck pain, arm pain Negative: Weakness, numbness  Physical Exam  BP (!) 189/105 (BP Location: Left Arm)   Pulse 77   Temp 98.3 F (36.8 C) (Oral)   Resp 18   SpO2 99%  Gen:   Awake, no distress   Resp:  Normal effort  MSK:   Moves extremities without difficulty  Other:  To palpation midline cervical spine, and cervical paraspinous muscles on the right, intact strength 5/5 bilateral upper lower extremities as well as normal gait, Romberg negative.  She is normal sensation throughout.  Medical Decision Making  Medically screening exam initiated at 9:20 AM.  Appropriate orders placed.  [name] was informed that the remainder of the evaluation will be completed by another provider, this initial triage assessment does not replace that evaluation, and the importance of remaining in the ED until their evaluation is complete.  Workup initiated   Anselmo Pickler, [place] 03/29/22 3568

## 2022-03-31 ENCOUNTER — Other Ambulatory Visit: Payer: Self-pay | Admitting: Internal Medicine

## 2022-04-01 ENCOUNTER — Other Ambulatory Visit: Payer: Self-pay | Admitting: Internal Medicine

## 2022-04-01 DIAGNOSIS — I420 Dilated cardiomyopathy: Secondary | ICD-10-CM

## 2022-04-01 DIAGNOSIS — I10 Essential (primary) hypertension: Secondary | ICD-10-CM

## 2022-04-02 ENCOUNTER — Other Ambulatory Visit: Payer: Self-pay

## 2022-04-02 ENCOUNTER — Encounter: Payer: Self-pay | Admitting: Physical Therapy

## 2022-04-02 ENCOUNTER — Ambulatory Visit: Payer: PPO | Attending: Family Medicine | Admitting: Physical Therapy

## 2022-04-02 DIAGNOSIS — M25511 Pain in right shoulder: Secondary | ICD-10-CM

## 2022-04-02 DIAGNOSIS — M6281 Muscle weakness (generalized): Secondary | ICD-10-CM

## 2022-04-02 DIAGNOSIS — R293 Abnormal posture: Secondary | ICD-10-CM

## 2022-04-02 NOTE — Therapy (Signed)
OUTPATIENT PHYSICAL THERAPY CERVICAL EVALUATION   Patient Name: Krystal [name] MRN: [id number] DOB:10-01-1929, 89 y.o., female Today's Date: 04/02/2022  END OF SESSION:  PT End of Session - 04/02/22 1949     Visit Number 1    Date for PT Re-Evaluation 05/16/22    Authorization Type [employer]    Authorization Time Period 04/02/22 to 05/16/22    Progress Note Due on Visit 10    PT Start Time 0932    PT Stop Time 1016    PT Time Calculation (min) 44 min    Activity Tolerance Patient tolerated treatment well;No increased pain    Behavior During Therapy WFL for tasks assessed/performed             Past Medical History:  Diagnosis Date   Adenomatous colon polyp    Allergy    seasonal   Anemia    pt. denies   Anxiety    CAD (coronary artery disease)    Cataract    cataracts removed left eye.   Chronic lower back pain    Diverticulosis    GERD (gastroesophageal reflux disease)    Headache(784.0)    Hiatal hernia    Hyperlipidemia    Hypertension    IBS (irritable bowel syndrome) 10/13/2011   Osteoarthritis    Osteoporosis    Renal cysts, acquired, bilateral    SBO (small bowel obstruction) ([place]) 09/05/2015   Venous insufficiency    Past Surgical History:  Procedure Laterality Date   ABDOMINAL HYSTERECTOMY     BLADDER REPAIR     CARDIAC CATHETERIZATION  01/12/2002   90% stenosis of the left circumflex, stented with a 3x19m Cypher stent, resulting in reduction of 90% to 10%    CARDIOVASCULAR STRESS TEST  03/22/2007   Mild inferolateral thinning toward the apex without significant ischemia. Nondiagnostic electrocardiogram.   CATARACT EXTRACTION     bilateral   COLONOSCOPY  08/17/2008   adenomatous polyp, diverticulosis, external hemorrhoids   COLONOSCOPY W/ BIOPSIES     multiple    LEFT LOWER EXTREMITY VENOUS DOPPLER  06/18/2011   No evidence of left lower extremity DVT   TRANSTHORACIC ECHOCARDIOGRAM  11/18/2012   EF 443-15% LV systolic mild-moderately  reduced, mild-moderate mitral valve regurg, mild-moderate tricuspid valve regurg. NSR-LBBB with occas PVCs   UPPER GASTROINTESTINAL ENDOSCOPY  07/03/2010   hiatal hernia   VARICOSE VEIN SURGERY     Patient Active Problem List   Diagnosis Date Noted   Urinary frequency 140/10/6759  Chronic systolic CHF (congestive heart failure) ([facility] 01/28/2022   UTI (urinary tract infection) 01/27/2022   Hyponatremia 01/26/2022   Laceration of occipital scalp 11/08/2021   Encounter for general adult medical examination with abnormal findings 05/08/2020   Primary osteoarthritis of first carpometacarpal joint of right hand 05/08/2020   Restless legs syndrome (RLS) 08/22/2019   Chronic peripheral neuropathic pain 08/22/2019   Chronic renal disease, stage 3, moderately decreased glomerular filtration rate (GFR) between 30-59 mL/min/1.73 square meter (HCC) 11/08/2018   Chronic idiopathic constipation 10/19/2017   LBBB (left bundle branch block) 07/16/2016   Cardiomyopathy ([facility] 07/16/2016   Benign paroxysmal positional vertigo 01/17/2016   Prediabetes 05/23/2015   IBS (irritable bowel syndrome) 10/13/2011   GERD (gastroesophageal reflux disease) 06/25/2010   Cough 03/09/2008   RENAL CYST 11/18/2007   OSTEOPOROSIS 11/18/2007   Allergic rhinitis 10/06/2007   Hyperlipidemia with target LDL less than 70 07/19/2007   GAD (generalized anxiety disorder) 07/19/2007   Essential hypertension 07/19/2007   CAD  S/P percutaneous coronary angioplasty 07/19/2007   VENOUS INSUFFICIENCY 07/19/2007   Osteoarthritis 07/19/2007   LOW BACK PAIN SYNDROME 07/19/2007    PCP: Scarlette Calico, MD  REFERRING PROVIDER: Lynne Leader, MD  REFERRING DIAG: Strain of rhomboid muscle   THERAPY DIAG:  Acute pain of right shoulder  Muscle weakness (generalized)  Abnormal posture  Rationale for Evaluation and Treatment: Rehabilitation  ONSET DATE: October 2023  SUBJECTIVE:                                                                                                                                                                                                          SUBJECTIVE STATEMENT: Pt states that she started having Rt shoulder pain in October. She was picking up a pack of water and felt "something" but it eventually got better. Since then, her pain returned. She has been wearing a sling since then because this helps with her pain. She denies numbness/tingling.   PERTINENT HISTORY:  HTN, anxiety, osteoporosis  PAIN:  Are you having pain? No none as long as in the sling  PRECAUTIONS: None  WEIGHT BEARING RESTRICTIONS: No  FALLS:  Has patient fallen in last 6 months? No  LIVING ENVIRONMENT: Lives with: lives with their family Lives in: House/apartment Stairs: No Has following equipment at home:   OCCUPATION: not working, helps with her husband-lifting, etc. He's unable   PLOF: Independent  PATIENT GOALS: decrease pain  NEXT MD VISIT:   OBJECTIVE:   DIAGNOSTIC FINDINGS:    PATIENT SURVEYS:  FOTO 47  COGNITION: Overall cognitive status: Within functional limits for tasks assessed  SENSATION: WFL- pt denies N/T  POSTURE: rounded shoulders, forward head, and increased thoracic kyphosis  PALPATION: Tenderness Rt upper trap, rhomboids, infraspinatus; trigger points and soreness Rt cervical paraspinals at C6/C7   CERVICAL ROM: PROM pain free except Rt lateral flexion  Active ROM A/PROM (deg) eval  Flexion WNL (+) Rt neck pain  Extension 30  Right lateral flexion   Left lateral flexion   Right rotation 70 (+) tightness Rt side of neck  Left rotation 70   (Blank rows = not tested)  UPPER EXTREMITY ROM: PROM WNL and pain free  Active ROM Right eval Left eval  Shoulder flexion WNL WNL  Shoulder extension    Shoulder abduction WNL WLN  Shoulder adduction    Shoulder extension    Shoulder internal rotation RBB: T10 RBB: T8  Shoulder external rotation RBH: T2 RBH: T2  Elbow  flexion    Elbow extension    Wrist flexion  Wrist extension    Wrist ulnar deviation    Wrist radial deviation    Wrist pronation    Wrist supination     (Blank rows = not tested)  UPPER EXTREMITY MMT:  MMT Right eval Left eval  Shoulder flexion 4 4  Shoulder extension    Shoulder abduction 4 3  Shoulder adduction    Shoulder extension    Shoulder internal rotation 4 4  Shoulder external rotation 3 3  Middle trapezius 3   Lower trapezius 2   Elbow flexion    Elbow extension    Wrist flexion    Wrist extension    Wrist ulnar deviation    Wrist radial deviation    Wrist pronation    Wrist supination    Grip strength     (Blank rows = not tested)  CERVICAL SPECIAL TESTS:  Spurling's test: Positive Rt   FUNCTIONAL TESTS:    TODAY'S TREATMENT:                                                                                                                              DATE:   04/02/22  Supine deep neck flexor activation 5x5 sec Doorway pec stretch 2x20 sec    PATIENT EDUCATION:  Education details: eval findings/POC Person educated: Patient Education method: Explanation, Verbal cues, and Handouts Education comprehension: verbalized understanding and returned demonstration  HOME EXERCISE PROGRAM: Access Code: [email or web address] URL: [email or web address] Date: 04/02/2022 Prepared by: [facility]  Exercises - Supine Deep Neck Flexor Training  - 2 x daily - 7 x weekly - 1 sets - 10 reps - 5 seconds hold - Doorway Pec Stretch at 60 Elevation  - 2 x daily - 7 x weekly - 2 sets - 20 seconds hold   ASSESSMENT:  CLINICAL IMPRESSION: Patient is a 89 y.o. F who was seen today for physical therapy evaluation and treatment for complaints of Rt shoulder pain since October 2023. Pain was first noted after lifting a case of water into her car. The pain eventually resolved, but has now returned. Pt feels she only gets  relief by wearing a Rt UE sling. Pt has active cervical and UE ROM WFL, however her pain was only reproduced with spurling test and passive/active cervical ROM. Pt has palpable trigger points in the Rt posterior shoulder and upper trap, lower cervical region. She has significant postural limitations with excessive thoracic kyphosis and forward head likely contributing to her pain. She would benefit from skilled PT to address her impairments listed above and to decrease her pain and improve ability to perform ADLs without limitation.   OBJECTIVE IMPAIRMENTS: decreased activity tolerance, decreased mobility, decreased ROM, decreased strength, increased muscle spasms, impaired flexibility, improper body mechanics, postural dysfunction, and pain.   ACTIVITY LIMITATIONS: carrying, lifting, and reach over head  PARTICIPATION LIMITATIONS: cleaning, shopping, and community activity  PERSONAL FACTORS: Age, Fitness, and Time since onset of  injury/illness/exacerbation are also affecting patient's functional outcome.   REHAB POTENTIAL: Good  CLINICAL DECISION MAKING: Stable/uncomplicated  EVALUATION COMPLEXITY: Low   GOALS: Goals reviewed with patient? Yes  SHORT TERM GOALS: Target date: 04/16/22  Pt will be independent with initial HEP to improve posture and UE strength. Baseline:  Goal status: INITIAL     LONG TERM GOALS: Target date: 05/16/22  Pt will report atleast 60% improvement in her pain from the start of PT. Baseline:  Goal status: INITIAL  2.  Pt will have pain free active and passive cervical ROM in all directions. Baseline:  Goal status: INITIAL  3.  Pt will have improved Rt shoulder and scap strength to 4/5 MMT. Baseline:  Goal status: INITIAL  4.  Pt will demonstrate improvements in posture awareness and ability to self correct her posture throughout her session. Baseline:  Goal status: INITIAL     PLAN:  PT FREQUENCY: 2x/week  PT DURATION: 6 weeks  PLANNED  INTERVENTIONS: Therapeutic exercises, Therapeutic activity, Neuromuscular re-education, Balance training, Gait training, Patient/Family education, Self Care, Joint mobilization, Dry Needling, Spinal mobilization, Cryotherapy, Moist heat, Traction, Manual therapy, and Re-evaluation  PLAN FOR NEXT SESSION: posture/shoulder strengthening, deep neck flexor strength, manual as needed for trigger points and thoracic mobility   8:06 PM,04/02/22 Sherol Dade PT, [facility] [place]

## 2022-04-03 ENCOUNTER — Other Ambulatory Visit: Payer: Self-pay | Admitting: Internal Medicine

## 2022-04-03 DIAGNOSIS — I10 Essential (primary) hypertension: Secondary | ICD-10-CM

## 2022-04-04 ENCOUNTER — Ambulatory Visit (INDEPENDENT_AMBULATORY_CARE_PROVIDER_SITE_OTHER): Payer: PPO | Admitting: Nurse Practitioner

## 2022-04-04 ENCOUNTER — Ambulatory Visit: Payer: PPO | Admitting: Physical Therapy

## 2022-04-04 VITALS — BP 180/94 | HR 71 | Temp 98.2°F | Ht 65.0 in | Wt 147.5 lb

## 2022-04-04 DIAGNOSIS — U071 COVID-19: Secondary | ICD-10-CM | POA: Diagnosis not present

## 2022-04-04 DIAGNOSIS — I10 Essential (primary) hypertension: Secondary | ICD-10-CM

## 2022-04-04 DIAGNOSIS — M79601 Pain in right arm: Secondary | ICD-10-CM | POA: Diagnosis not present

## 2022-04-04 LAB — POC COVID19 BINAXNOW: SARS Coronavirus 2 Ag: POSITIVE — AB

## 2022-04-04 MED ORDER — OXYCODONE HCL 5 MG PO TABS: 5.0000 mg | ORAL_TABLET | Freq: Two times a day (BID) | ORAL | 0 refills | Status: AC | PRN

## 2022-04-04 NOTE — Assessment & Plan Note (Signed)
Pain not well-controlled, neurologic evaluation remains intact.  Consulted with patient's primary care provider who is also my backup supervising physician regarding patient complaint and presentation.  Per discussion will prescribe short close of oxycodone 5 mg tablets for patient to take as needed for pain management.  Patient educated not to mix oxycodone with hydrocodone as well as to not mix oxycodone with Lyrica.  Patient reports understanding.  Patient will follow-up next week for close monitoring with PCP, or sooner as needed.

## 2022-04-04 NOTE — Assessment & Plan Note (Signed)
Acute, symptoms remain mild, vital signs stable.  Encourage patient to call 911 if she experiences shortness of breath.  Patient reports understanding.

## 2022-04-04 NOTE — Progress Notes (Signed)
Established Patient Office Visit  Subjective   Patient ID: Krystal [name], female    DOB: 1929/09/25  Age: 89 y.o. MRN: [id number]  Chief Complaint  Patient presents with   Arm Pain   Patient arrives today for acute visit for the above.  Reports she has been having right arm and shoulder pain for approximately 1 month.  Reports the pain as severe and steady.  So far has been evaluated in the emergency department at which point CT scan of C-spine was completed which did not show any acute abnormalities to explain her pain.  Multilevel degenerative disc disease of the cervical spine including cervical spondylosis at C4-C5 and C5-C6 was noted.  Patient is already tried and failed ibuprofen and hydrocodone-acetaminophen.  She reports that she is scheduled for an MRI next Saturday for further evaluation.  She is here today requesting something to help her manage her pain.  Additionally she reports that she is positive for COVID-19 infection, symptoms started 1 week ago but remain mild.  She denies any cough or shortness of breath.  She also has chronic hypertension and is currently prescribed losartan 50 mg twice a day, metoprolol 50 mg daily, and hydralazine 25 mg 3 times a day.  She reports that she is only taking her hydralazine twice a day.     Review of Systems  Respiratory:  Negative for cough and shortness of breath.   Cardiovascular:  Negative for chest pain.  Neurological:  Negative for seizures.      Objective:     BP (!) 180/94   Pulse 71   Temp 98.2 F (36.8 C) (Temporal)   Ht '5\' 5"'$  (1.651 m)   Wt 147 lb 8 oz (66.9 kg)   SpO2 96%   BMI 24.55 kg/m  BP Readings from Last 3 Encounters:  04/04/22 (!) 180/94  03/29/22 (!) 187/77  03/22/22 (!) 177/73   Wt Readings from Last 3 Encounters:  04/04/22 147 lb 8 oz (66.9 kg)  03/21/22 154 lb 5.2 oz (70 kg)  03/05/22 153 lb (69.4 kg)      Physical Exam Vitals reviewed.  Constitutional:      General: She is not in acute  distress.    Appearance: Normal appearance.  HENT:     Head: Normocephalic and atraumatic.  Neck:     Vascular: No carotid bruit.  Cardiovascular:     Rate and Rhythm: Normal rate and regular rhythm.     Pulses: Normal pulses.     Heart sounds: Normal heart sounds.  Pulmonary:     Effort: Pulmonary effort is normal.     Breath sounds: Normal breath sounds.  Musculoskeletal:     Right shoulder: Normal. No swelling, tenderness or bony tenderness. Normal pulse.     Right upper arm: No swelling, edema, tenderness or bony tenderness.  Skin:    General: Skin is warm and dry.  Neurological:     General: No focal deficit present.     Mental Status: She is alert and oriented to person, place, and time.     Sensory: Sensation is intact.     Motor: Motor function is intact.  Psychiatric:        Mood and Affect: Mood normal.        Behavior: Behavior normal.        Judgment: Judgment normal.      Results for orders placed or performed in visit on 04/04/22  POC COVID-19  Result Value Ref Range  SARS Coronavirus 2 Ag Positive (A) Negative      The ASCVD Risk score (Arnett DK, et al., 2019) failed to calculate for the following reasons:   The 2019 ASCVD risk score is only valid for ages 63 to 70    Assessment & Plan:   Problem List Items Addressed This Visit       Cardiovascular and Mediastinum   Essential hypertension    Chronic, not well-controlled currently.  May be partially elevated related to severity of pain.  Patient encouraged to continue taking losartan 50 mg a mouth twice a day, metoprolol 50 mg daily, and to make sure she is taking hydralazine 25 mg 3 times a day.  She was educated that if she experiences chest pain, shortness of breath, weakness in any extremity, difficulty speaking, difficulty swallowing that she needs to call 911 as these are signs of a heart attack and/or stroke.  Patient reports understanding.        Other   COVID-19    Acute, symptoms  remain mild, vital signs stable.  Encourage patient to call 911 if she experiences shortness of breath.  Patient reports understanding.      Relevant Orders   POC COVID-19 (Completed)   Right arm pain - Primary    Pain not well-controlled, neurologic evaluation remains intact.  Consulted with patient's primary care provider who is also my backup supervising physician regarding patient complaint and presentation.  Per discussion will prescribe short close of oxycodone 5 mg tablets for patient to take as needed for pain management.  Patient educated not to mix oxycodone with hydrocodone as well as to not mix oxycodone with Lyrica.  Patient reports understanding.  Patient will follow-up next week for close monitoring with PCP, or sooner as needed.      Relevant Medications   oxyCODONE (OXY IR/ROXICODONE) 5 MG immediate release tablet    Return in about 1 week (around 04/11/2022).    Ailene Ards, NP

## 2022-04-04 NOTE — Patient Instructions (Signed)
Do not mix the hydrocodone with the oxycodone. Do not mix pregabalin with the oxycodone.

## 2022-04-04 NOTE — Assessment & Plan Note (Signed)
Chronic, not well-controlled currently.  May be partially elevated related to severity of pain.  Patient encouraged to continue taking losartan 50 mg a mouth twice a day, metoprolol 50 mg daily, and to make sure she is taking hydralazine 25 mg 3 times a day.  She was educated that if she experiences chest pain, shortness of breath, weakness in any extremity, difficulty speaking, difficulty swallowing that she needs to call 911 as these are signs of a heart attack and/or stroke.  Patient reports understanding.

## 2022-04-05 DIAGNOSIS — M25519 Pain in unspecified shoulder: Secondary | ICD-10-CM | POA: Diagnosis not present

## 2022-04-05 DIAGNOSIS — R42 Dizziness and giddiness: Secondary | ICD-10-CM | POA: Diagnosis not present

## 2022-04-05 DIAGNOSIS — I4430 Unspecified atrioventricular block: Secondary | ICD-10-CM | POA: Diagnosis not present

## 2022-04-05 DIAGNOSIS — I447 Left bundle-branch block, unspecified: Secondary | ICD-10-CM | POA: Diagnosis not present

## 2022-04-05 DIAGNOSIS — I440 Atrioventricular block, first degree: Secondary | ICD-10-CM | POA: Diagnosis not present

## 2022-04-07 ENCOUNTER — Encounter: Payer: PPO | Admitting: Physical Therapy

## 2022-04-07 ENCOUNTER — Telehealth: Payer: Self-pay

## 2022-04-07 NOTE — Telephone Encounter (Signed)
Pt PA for Ondansetron is send to [place]

## 2022-04-08 ENCOUNTER — Other Ambulatory Visit: Payer: Self-pay

## 2022-04-08 ENCOUNTER — Emergency Department (HOSPITAL_COMMUNITY): Payer: PPO

## 2022-04-08 ENCOUNTER — Telehealth: Payer: Self-pay | Admitting: Internal Medicine

## 2022-04-08 ENCOUNTER — Encounter (HOSPITAL_COMMUNITY): Payer: Self-pay

## 2022-04-08 ENCOUNTER — Emergency Department (HOSPITAL_COMMUNITY)
Admission: EM | Admit: 2022-04-08 | Discharge: 2022-04-08 | Discharge status: Against Medical Advice | Payer: PPO | Attending: Emergency Medicine | Admitting: Emergency Medicine

## 2022-04-08 DIAGNOSIS — Z5321 Procedure and treatment not carried out due to patient leaving prior to being seen by health care provider: Secondary | ICD-10-CM | POA: Insufficient documentation

## 2022-04-08 DIAGNOSIS — M25511 Pain in right shoulder: Secondary | ICD-10-CM | POA: Diagnosis not present

## 2022-04-08 DIAGNOSIS — R110 Nausea: Secondary | ICD-10-CM | POA: Diagnosis present

## 2022-04-08 DIAGNOSIS — R112 Nausea with vomiting, unspecified: Secondary | ICD-10-CM | POA: Diagnosis not present

## 2022-04-08 DIAGNOSIS — R531 Weakness: Secondary | ICD-10-CM | POA: Diagnosis not present

## 2022-04-08 DIAGNOSIS — U071 COVID-19: Secondary | ICD-10-CM | POA: Insufficient documentation

## 2022-04-08 LAB — URINALYSIS, ROUTINE W REFLEX MICROSCOPIC
Bacteria, UA: NONE SEEN
Bilirubin Urine: NEGATIVE
Glucose, UA: NEGATIVE mg/dL
Hgb urine dipstick: NEGATIVE
Ketones, ur: NEGATIVE mg/dL
Nitrite: NEGATIVE
Protein, ur: NEGATIVE mg/dL
Specific Gravity, Urine: 1.006 (ref 1.005–1.030)
pH: 7 (ref 5.0–8.0)

## 2022-04-08 LAB — CBC WITH DIFFERENTIAL/PLATELET
Abs Immature Granulocytes: 0.03 10*3/uL (ref 0.00–0.07)
Basophils Absolute: 0 10*3/uL (ref 0.0–0.1)
Basophils Relative: 0 %
Eosinophils Absolute: 0.2 10*3/uL (ref 0.0–0.5)
Eosinophils Relative: 2 %
HCT: 41.1 % (ref 36.0–46.0)
Hemoglobin: 13.2 g/dL (ref 12.0–15.0)
Immature Granulocytes: 0 %
Lymphocytes Relative: 25 %
Lymphs Abs: 1.8 10*3/uL (ref 0.7–4.0)
MCH: 31.1 pg (ref 26.0–34.0)
MCHC: 32.1 g/dL (ref 30.0–36.0)
MCV: 96.7 fL (ref 80.0–100.0)
Monocytes Absolute: 0.5 10*3/uL (ref 0.1–1.0)
Monocytes Relative: 7 %
Neutro Abs: 4.9 10*3/uL (ref 1.7–7.7)
Neutrophils Relative %: 66 %
Platelets: 269 10*3/uL (ref 150–400)
RBC: 4.25 MIL/uL (ref 3.87–5.11)
RDW: 13.5 % (ref 11.5–15.5)
WBC: 7.4 10*3/uL (ref 4.0–10.5)
nRBC: 0 % (ref 0.0–0.2)

## 2022-04-08 LAB — COMPREHENSIVE METABOLIC PANEL
ALT: 14 U/L (ref 0–44)
AST: 17 U/L (ref 15–41)
Albumin: 3.8 g/dL (ref 3.5–5.0)
Alkaline Phosphatase: 84 U/L (ref 38–126)
Anion gap: 6 (ref 5–15)
BUN: 11 mg/dL (ref 8–23)
CO2: 25 mmol/L (ref 22–32)
Calcium: 8.6 mg/dL — ABNORMAL LOW (ref 8.9–10.3)
Chloride: 105 mmol/L (ref 98–111)
Creatinine, Ser: 0.88 mg/dL (ref 0.44–1.00)
GFR, Estimated: >60 mL/min (ref >60)
Glucose, Bld: 112 mg/dL — ABNORMAL HIGH (ref 70–99)
Potassium: 3.7 mmol/L (ref 3.5–5.1)
Sodium: 136 mmol/L (ref 135–145)
Total Bilirubin: 0.4 mg/dL (ref 0.3–1.2)
Total Protein: 6.5 g/dL (ref 6.5–8.1)

## 2022-04-08 LAB — RESP PANEL BY RT-PCR (RSV, FLU A&B, COVID)  RVPGX2
Influenza A by PCR: NEGATIVE
Influenza B by PCR: NEGATIVE
Resp Syncytial Virus by PCR: NEGATIVE
SARS Coronavirus 2 by RT PCR: POSITIVE — AB

## 2022-04-08 NOTE — Telephone Encounter (Signed)
Patient called wanting to be seen today instead of tomorrow - I let her know that at present we are booked this afternoon but I will call if we have a cancellation. She wants to know if she can come in today for lab draw - states she feels as though her sodium levels are off and that's why she feels bad. Call back number is [phone].

## 2022-04-08 NOTE — ED Provider Triage Note (Signed)
Emergency Medicine Provider Triage Evaluation Note  Krystal [name] , a 89 y.o. female  was evaluated in triage.  Pt complains of weakness.  Patient reports that she recently tested positive for COVID-19 a few weeks ago and had been struggling with this. She also reports that she is taking oxycodone for right shoulder pain and she is concerned this is possibly caused her sodium to be low.  She reports that she typically has to have IV fluids given if her sodium drops low.  Otherwise denies chest pain, headaches, diarrhea, vomiting, shortness of breath.  Review of Systems  Positive: As above Negative: As above  Physical Exam  BP (!) 164/86 (BP Location: Left Arm)   Pulse 66   Temp 97.8 F (36.6 C) (Oral)   Resp 19   SpO2 98%  Gen:   Awake, no distress   Resp:  Normal effort clear to auscultation bilaterally MSK:   Moves extremities without difficulty  Other:    Medical Decision Making  Medically screening exam initiated at 3:59 PM.  Appropriate orders placed.  [name] was informed that the remainder of the evaluation will be completed by another provider, this initial triage assessment does not replace that evaluation, and the importance of remaining in the ED until their evaluation is complete.     Krystal Heller, PA-C 04/08/22 1600

## 2022-04-08 NOTE — ED Triage Notes (Signed)
Patient said she is nauseous. Also has right shoulder pain that she was prescribed oxycodone for and is waiting for an MRI on the shoulder. She is worried the oxycodone is making her sodium low.

## 2022-04-09 ENCOUNTER — Ambulatory Visit (INDEPENDENT_AMBULATORY_CARE_PROVIDER_SITE_OTHER): Payer: PPO | Admitting: Internal Medicine

## 2022-04-09 ENCOUNTER — Encounter: Payer: PPO | Admitting: Physical Therapy

## 2022-04-09 ENCOUNTER — Encounter: Payer: Self-pay | Admitting: Internal Medicine

## 2022-04-09 VITALS — BP 136/80 | HR 69 | Temp 98.1°F | Ht 65.0 in | Wt 145.0 lb

## 2022-04-09 DIAGNOSIS — M5030 Other cervical disc degeneration, unspecified cervical region: Secondary | ICD-10-CM | POA: Diagnosis not present

## 2022-04-09 DIAGNOSIS — Z515 Encounter for palliative care: Secondary | ICD-10-CM | POA: Diagnosis not present

## 2022-04-09 DIAGNOSIS — U071 COVID-19: Secondary | ICD-10-CM

## 2022-04-09 DIAGNOSIS — M159 Polyosteoarthritis, unspecified: Secondary | ICD-10-CM

## 2022-04-09 MED ORDER — DURAGESIC-25 25 MCG/HR TD PT72: 1.0000 | MEDICATED_PATCH | TRANSDERMAL | 0 refills | Status: DC

## 2022-04-09 NOTE — Progress Notes (Signed)
Subjective:  Patient ID: Krystal [name], female    DOB: 05/10/1929  Age: 89 y.o. MRN: [id number]  CC: Osteoarthritis   HPI Krystal [name] presents for f/up --  "I am not leaving until you give me something for pain." Previous meds have not helped. She was positive for COVID 1 week ago and continues to c/o chills. She was not treated.  Outpatient Medications Prior to Visit  Medication Sig Dispense Refill   acetaminophen (TYLENOL) 500 MG tablet Take 500 mg by mouth every 4 (four) hours as needed for mild pain or headache.     albuterol (VENTOLIN HFA) 108 (90 Base) MCG/ACT inhaler TAKE 2 PUFFS BY MOUTH EVERY 6 HOURS AS NEEDED FOR WHEEZE OR SHORTNESS OF BREATH (Patient taking differently: Inhale 2 puffs into the lungs every 6 (six) hours as needed for shortness of breath or wheezing.) 8.5 each 1   aspirin EC 81 MG tablet Take 81 mg by mouth daily.     Calcium Carbonate Antacid (CALCIUM CARBONATE PO) Take 1 tablet by mouth daily.     Cholecalciferol (VITAMIN D-3 PO) Take 1 capsule by mouth daily.     cycloSPORINE (RESTASIS) 0.05 % ophthalmic emulsion Place 1 drop into both eyes 2 (two) times daily.     docusate sodium (COLACE) 100 MG capsule Take 100 mg by mouth daily as needed (constipation).     fluticasone (FLONASE) 50 MCG/ACT nasal spray INSTILL 1 SPRAY INTO BOTH NOSTRILS DAILY (Patient taking differently: Place 1 spray into both nostrils daily.) 16 mL 5   hydrALAZINE (APRESOLINE) 25 MG tablet TAKE 1 TABLET BY MOUTH THREE TIMES A DAY 270 tablet 1   lansoprazole (PREVACID) 15 MG capsule Take 1 capsule (15 mg total) by mouth 2 (two) times daily before a meal. 180 capsule 3   loratadine (CLARITIN) 10 MG tablet TAKE 1 TABLET BY MOUTH EVERY DAY (Patient taking differently: Take 10 mg by mouth daily.) 90 tablet 3   LORazepam (ATIVAN) 0.5 MG tablet TAKE 1 TABLET BY MOUTH AT BEDTIME AS NEEDED FOR ANXIETY. (Patient taking differently: Take 0.5 mg by mouth at bedtime as needed for anxiety.) 90 tablet  1   losartan (COZAAR) 50 MG tablet TAKE 1 TABLET BY MOUTH TWICE A DAY 180 tablet 1   metoprolol succinate (TOPROL-XL) 50 MG 24 hr tablet Take 1 tablet (50 mg total) by mouth daily. Take with or immediately following a meal. 90 tablet 3   ondansetron (ZOFRAN-ODT) 4 MG disintegrating tablet TAKE 1 TABLET BY MOUTH EVERY 8 HOURS AS NEEDED FOR NAUSEA AND VOMITING 30 tablet 0   oxyCODONE (OXY IR/ROXICODONE) 5 MG immediate release tablet Take 1 tablet (5 mg total) by mouth every 12 (twelve) hours as needed for up to 5 days for severe pain. 10 tablet 0   pregabalin (LYRICA) 25 MG capsule Take 1 capsule (25 mg total) by mouth at bedtime. 90 capsule 1   PREMARIN vaginal cream Place 1 Application vaginally See admin instructions. Apply vaginally 2-3 times a week as directed     rosuvastatin (CRESTOR) 10 MG tablet TAKE 1 TABLET BY MOUTH EVERY DAY 90 tablet 1   No facility-administered medications prior to visit.    ROS Review of Systems  Constitutional:  Positive for appetite change and chills. Negative for diaphoresis, fatigue and fever.  HENT: Negative.  Negative for trouble swallowing and voice change.   Respiratory: Negative.  Negative for cough, chest tightness, shortness of breath and wheezing.   Cardiovascular:  Negative for chest  pain, palpitations and leg swelling.  Gastrointestinal:  Negative for abdominal pain, constipation, diarrhea, nausea and vomiting.  Genitourinary: Negative.   Musculoskeletal:  Positive for arthralgias.  Neurological: Negative.  Negative for dizziness and weakness.  Hematological:  Negative for adenopathy. Does not bruise/bleed easily.  Psychiatric/Behavioral:  Positive for sleep disturbance. Negative for decreased concentration, dysphoric mood and suicidal ideas. The patient is nervous/anxious.     Objective:  BP 136/80 (BP Location: Right Arm, Patient Position: Sitting, Cuff Size: Large)   Pulse 69   Temp 98.1 F (36.7 C) (Oral)   Ht '5\' 5"'$  (1.651 m)   Wt 145 lb  (65.8 kg)   SpO2 94%   BMI 24.13 kg/m   BP Readings from Last 3 Encounters:  04/09/22 136/80  04/08/22 (!) 169/78  04/04/22 (!) 180/94    Wt Readings from Last 3 Encounters:  04/09/22 145 lb (65.8 kg)  04/04/22 147 lb 8 oz (66.9 kg)  03/21/22 154 lb 5.2 oz (70 kg)    Physical Exam Vitals reviewed.  HENT:     Nose: Nose normal.     Mouth/Throat:     Mouth: Mucous membranes are moist.  Eyes:     General: No scleral icterus.    Conjunctiva/sclera: Conjunctivae normal.  Cardiovascular:     Rate and Rhythm: Normal rate and regular rhythm.     Heart sounds: No murmur heard. Pulmonary:     Effort: Pulmonary effort is normal.     Breath sounds: No stridor. No wheezing, rhonchi or rales.  Abdominal:     General: Abdomen is flat.     Palpations: There is no mass.     Tenderness: There is no abdominal tenderness. There is no guarding.     Hernia: No hernia is present.  Musculoskeletal:        General: Normal range of motion.     Cervical back: Neck supple.     Right lower leg: No edema.     Left lower leg: No edema.  Lymphadenopathy:     Cervical: No cervical adenopathy.  Skin:    General: Skin is warm and dry.  Neurological:     General: No focal deficit present.     Mental Status: She is alert. Mental status is at baseline.  Psychiatric:        Mood and Affect: Mood normal.        Behavior: Behavior normal.     Lab Results  Component Value Date   WBC 7.4 04/08/2022   HGB 13.2 04/08/2022   HCT 41.1 04/08/2022   PLT 269 04/08/2022   GLUCOSE 112 (H) 04/08/2022   CHOL 139 06/26/2021   TRIG 101.0 06/26/2021   HDL 45.80 06/26/2021   LDLDIRECT 160.4 12/19/2008   LDLCALC 73 06/26/2021   ALT 14 04/08/2022   AST 17 04/08/2022   NA 136 04/08/2022   K 3.7 04/08/2022   CL 105 04/08/2022   CREATININE 0.88 04/08/2022   BUN 11 04/08/2022   CO2 25 04/08/2022   TSH 2.260 01/26/2022   HGBA1C 6.1 06/26/2021    DG Chest 2 View  Result Date: 04/08/2022 CLINICAL DATA:   Weakness. EXAM: CHEST - 2 VIEW COMPARISON:  01/26/2022 FINDINGS: The heart size is normal. Stable tortuosity of the thoracic aorta. There is no evidence of pulmonary edema, consolidation, pneumothorax, nodule or pleural fluid. Stable osteopenia of the thoracic spine with mild loss of height of several midthoracic vertebral bodies. IMPRESSION: No active cardiopulmonary disease. Stable osteopenia of the thoracic  spine with mild loss of height of several midthoracic vertebral bodies. Electronically Signed   By: Aletta Edouard M.D.   On: 04/08/2022 16:55   DG Chest 2 View  Result Date: 04/08/2022 CLINICAL DATA:  Weakness. EXAM: CHEST - 2 VIEW COMPARISON:  01/26/2022 FINDINGS: The heart size is normal. Stable tortuosity of the thoracic aorta. There is no evidence of pulmonary edema, consolidation, pneumothorax, nodule or pleural fluid. Stable osteopenia of the thoracic spine with mild loss of height of several midthoracic vertebral bodies. IMPRESSION: No active cardiopulmonary disease. Stable osteopenia of the thoracic spine with mild loss of height of several midthoracic vertebral bodies. Electronically Signed   By: Aletta Edouard M.D.   On: 04/08/2022 16:55   CT Cervical Spine Wo Contrast  Result Date: 03/29/2022 CLINICAL DATA:  89 year old female with history of chronic neck pain. EXAM: CT CERVICAL SPINE WITHOUT CONTRAST TECHNIQUE: Multidetector CT imaging of the cervical spine was performed without intravenous contrast. Multiplanar CT image reconstructions were also generated. RADIATION DOSE REDUCTION: This exam was performed according to the departmental dose-optimization program which includes automated exposure control, adjustment of the mA and/or kV according to patient size and/or use of iterative reconstruction technique. COMPARISON:  No priors. FINDINGS: Alignment: Normal. Skull base and vertebrae: No acute fracture. No primary bone lesion or focal pathologic process. Soft tissues and spinal canal:  No prevertebral fluid or swelling. No visible canal hematoma. Disc levels: Mild multilevel degenerative disc disease, most evident at C4-C5 and C5-C6. Moderate multilevel facet arthropathy. Upper chest: Unremarkable. Other: None. IMPRESSION: 1. No acute abnormality of the cervical spine to account for the patient's symptoms. 2. Multilevel degenerative disc disease and cervical spondylosis, as above. Electronically Signed   By: Vinnie Langton M.D.   On: 03/29/2022 10:00   DG Chest 2 View  Result Date: 01/26/2022 CLINICAL DATA:  Productive cough. EXAM: CHEST - 2 VIEW COMPARISON:  09/03/2021 FINDINGS: The heart is normal in size. Similar aortic tortuosity. Coronary artery calcifications or stents. Subsegmental atelectasis in the lung bases. Pulmonary vasculature is normal. No consolidation, pleural effusion, or pneumothorax. Exaggerated thoracic kyphosis. Mild chronic midthoracic compression deformities. No acute osseous abnormalities are seen. IMPRESSION: 1. Subsegmental bibasilar atelectasis. 2. Coronary artery calcifications or stents. Electronically Signed   By: Keith Rake M.D.   On: 01/26/2022 16:11      Assessment & Plan:   Krystal [name] was seen today for osteoarthritis.  Diagnoses and all orders for this visit:  Encounter for palliative care involving management of pain -     DURAGESIC 25 MCG/HR; Place 1 patch onto the skin every 3 (three) days.  Primary osteoarthritis involving multiple joints -     DURAGESIC 25 MCG/HR; Place 1 patch onto the skin every 3 (three) days.  DDD (degenerative disc disease), cervical -     DURAGESIC 25 MCG/HR; Place 1 patch onto the skin every 3 (three) days.  [id number]- No complications. She has presented too late for antivirals.   I am having Krystal [name] start on Krystal [name]. I am also having her maintain her acetaminophen, Premarin, docusate sodium, aspirin EC, loratadine, albuterol, LORazepam, fluticasone, cycloSPORINE, Cholecalciferol (VITAMIN D-3 PO),  Calcium Carbonate Antacid (CALCIUM CARBONATE PO), metoprolol succinate, pregabalin, losartan, lansoprazole, rosuvastatin, ondansetron, hydrALAZINE, and oxyCODONE.  Meds ordered this encounter  Medications   DURAGESIC 25 MCG/HR    Sig: Place 1 patch onto the skin every 3 (three) days.    Dispense:  10 patch    Refill:  0     Follow-up:  No follow-ups on file.  Scarlette Calico, MD

## 2022-04-09 NOTE — Patient Instructions (Signed)
Fentanyl Patches What is this medication? FENTANYL (FEN ta nil) treats severe, chronic pain. It is prescribed when other pain medications do not work well enough or cannot be tolerated. It works by blocking pain signals in the brain. It belongs to a group of medication called opioids. It is a long-acting pain medication. Do not use it to treat sudden pain. This medicine may be used for other purposes; ask your health care provider or pharmacist if you have questions. COMMON BRAND NAME(S): Duragesic What should I tell my care team before I take this medication? They need to know if you have any of these conditions: Brain tumor Drug abuse or addiction Head injury Heart disease If you often drink alcohol Kidney disease Liver disease Lung disease, asthma, or breathing problem Seizures Stomach or intestine problems Taken an MAOI such as Marplan, Nardil, or Parnate in the last 14 days An unusual or allergic reaction to fentanyl, other medications, foods, dyes, or preservatives Pregnant or trying to get pregnant Breast-feeding How should I use this medication? This medication is for external use only. Apply the patch to your skin. Select a clean, dry area of skin above your waist on your front or back. The upper back is a good spot to put the patch on children or people who are confused because it will be hard for them to remove the patch. Do not apply the patch to oily, broken, burned, cut, or irritated skin. Use only water to clean the area. Do not use soap or alcohol to clean the skin because this can increase the effects of the medication. If the area is hairy, clip the hair with scissors, but do not shave. Do not cut or damage the patch. A cut or damaged patch can be very dangerous because you may get too much medication. Take the patch out of its wrapper, and take off the protective strip over the sticky part. Do not use a patch if the packaging or backing is damaged. Do not touch the sticky  part with your fingers. Press the sticky surface to the skin using the palm of your hand. Press the patch to the skin for 30 seconds. Wash your hands at once with soap and water. Keep patches far away from children. Do not let children see you apply the patch and do not apply it where children can see it. Do not call the patch a sticker, tattoo, or bandage, as this could encourage the child to mimic your actions. Used patches still contain medication. Children or pets can have serious side effects or die from putting used patches in their mouth or on their bodies. Take off the old patch before putting on a new patch. Apply each new patch to a different area of skin. If a patch comes off or causes irritation, remove it and apply a new patch to different site. If the edges of the patch start to loosen, apply first aid tape to the edges of the patch. If problems with the patch not sticking continue, cover the patch with a see-through adhesive dressing (like Bioclusive or Tegaderm). Never cover the patch with any other bandage or tape. A special MedGuide will be given to you by the pharmacist with each prescription and refill. Be sure to read this information carefully each time. Talk to your care team regarding the use of this medication in children. While this medication may be prescribed for children as young as 2 years old for selected conditions, precautions do apply. Overdosage: If   you think you have taken too much of this medicine contact a poison control center or emergency room at once. NOTE: This medicine is only for you. Do not share this medicine with others. What if I miss a dose? If you miss a dose, use it as soon as you can. If it is almost time for your next dose, use only that dose. Do not use double or extra doses. What may interact with this medication? Do not take this medication with any of the following: Mifepristone Safinamide Samidorphan This medication may also interact with the  following: Alcohol Antihistamines for allergy, cough, and cold Atropine Certain antibiotics like clarithromycin, erythromycin, linezolid, rifampin Certain antivirals for HIV or hepatitis Certain medications for anxiety or sleep Certain medications for bladder problems like oxybutynin, tolterodine Certain medications for depression like amitriptyline, fluoxetine, sertraline, mirtazapine, trazodone Certain medications for fungal infections like ketoconazole, itraconazole, posaconazole Certain medications for migraine headache like almotriptan, eletriptan, frovatriptan, naratriptan, rizatriptan, sumatriptan, zolmitriptan Certain medications for nausea or vomiting like dolasetron, granisetron, ondansetron, palonosetron Certain medications for Parkinson's disease like benztropine, trihexyphenidyl Certain medications for seizures like carbamazepine, phenobarbital, phenytoin, primidone Certain medications for stomach problems like dicyclomine, hyoscyamine Certain medications for travel sickness like scopolamine Diuretics General anesthetics like halothane, isoflurane, methoxyflurane, propofol Ipratropium MAOIs like Marplan, Nardil, and Parnate Medications that relax muscles Methylene blue Other narcotic medications for pain or cough Phenothiazines like chlorpromazine, mesoridazine, prochlorperazine, thioridazine This list may not describe all possible interactions. Give your health care provider a list of all the medicines, herbs, non-prescription drugs, or dietary supplements you use. Also tell them if you smoke, drink alcohol, or use illegal drugs. Some items may interact with your medicine. What should I watch for while using this medication? Tell your care team if your pain does not go away, if it gets worse, or if you have new or a different type of pain. You may develop tolerance to this medication. Tolerance means that you will need a higher dose of the medication for pain relief. Tolerance  is normal and is expected if you take this medication for a long time. Taking this medication with other substances that cause drowsiness, such as alcohol, benzodiazepines, or other opioids can cause serious side effects. Give your care team a list of all medications you use. They will tell you how much medication to take. Do not take more medication than directed. Call emergency services if you have problems breathing or staying awake. Children may be at higher risk for side effects. Stop giving this medication and call emergency services right away if your child has slow or noisy breathing, has confusion, is unusually sleepy, or not able to wake up. Long term use of this medication may cause your brain and body to depend on it. This can happen even when used as directed by your care team. You and your care team will work together to determine how long you will need to take this medication. If your care team wants you to stop this medication, the dose will be slowly lowered over time to reduce the risk of side effects. Naloxone is an emergency medication used for an opioid overdose. An overdose can happen if you take too much of an opioid. It can also happen if an opioid is taken with some other medications or substances such as alcohol. Know the symptoms of an overdose, such as trouble breathing, unusually tired or sleepy, or not being able to respond or wake up. Make sure to tell caregivers   and close contacts where your naloxone is stored. Make sure they know how to use it. After naloxone is given, the person giving it must call emergency services. Naloxone is a temporary treatment. Repeat doses may be needed. This medication may affect your coordination, reaction time, or judgment. Do not drive or operate machinery until you know how this medication affects you. Sit up or stand slowly to reduce the risk of dizzy or fainting spells. Drinking alcohol with this medication can increase the risk of these side  effects. This patch is sensitive to body heat changes. If your skin gets too hot, more medication will come out of the patch and can cause an overdose. Call your care team if you get a fever. Do not take hot baths. Do not sunbathe. Do not use hot tubs, saunas, hairdryers, heating pads, electric blankets, heated waterbeds, or tanning lamps. Do not engage in exercise that increases your body temperature. If you are going to need surgery, an MRI, CT scan, or other procedure, tell your care team that you are using this medication. You may need to remove the patch before the procedure. This medication will cause constipation. If you do not have a bowel movement for 3 days, call your care team. Your mouth may get dry. Chewing sugarless gum or sucking hard candy and drinking plenty of water may help. Contact your care team if the problem does not go away or is severe. Talk to your care team if you are pregnant or think you might be pregnant. This medication can cause serious birth defects if taken during pregnancy. Prolonged use of this medication during pregnancy can cause withdrawal in a newborn. Talk to your care team before breastfeeding. Changes to your treatment plan may be needed. If you breastfeed while taking this medication, seek medical care right away if you notice the child has slow or noisy breathing, is unusually sleepy or not able to wake up, or is limp. Long-term use of this medication may cause infertility. Talk to your care team if you are concerned about your fertility. What side effects may I notice from receiving this medication? Side effects that you should report to your care team as soon as possible: Allergic reactions--skin rash, itching, hives, swelling of the face, lips, tongue, or throat CNS depression--slow or shallow breathing, shortness of breath, feeling faint, dizziness, confusion, trouble staying awake Low adrenal gland function--nausea, vomiting, loss of appetite, unusual  weakness or fatigue, dizziness Low blood pressure--dizziness, feeling faint or lightheaded, blurry vision Side effects that usually do not require medical attention (report to your care team if they continue or are bothersome): Constipation Dizziness Drowsiness Dry mouth Headache Nausea Vomiting This list may not describe all possible side effects. Call your doctor for medical advice about side effects. You may report side effects to FDA at 1-800-FDA-1088. Where should I keep my medication? Keep out of the reach of children and pets. This medication can be abused. Keep it in a safe place to protect it from theft. Do not share it with anyone. It is only for you. Selling or giving away this medication is dangerous and against the law. Store at room temperature between 20 and 25 degrees C (68 and 77 degrees F). Keep this medication in the original packaging until you are ready to take it. Get rid of any unused medication after the expiration date. This medication may cause harm and death if it is taken by other adults, children, or pets. It is important to get   rid of the medication as soon as you no longer need it, or it is expired. You can do this in two ways: Take the medication to a medication take-back program. Check with your pharmacy or law enforcement to find a location. If you cannot return the medication, flush it down the toilet. NOTE: This sheet is a summary. It may not cover all possible information. If you have questions about this medicine, talk to your doctor, pharmacist, or health care provider.  2023 Elsevier/Gold Standard (2020-03-21 00:00:00)  

## 2022-04-10 ENCOUNTER — Telehealth: Payer: Self-pay | Admitting: Internal Medicine

## 2022-04-10 DIAGNOSIS — F40240 Claustrophobia: Secondary | ICD-10-CM | POA: Insufficient documentation

## 2022-04-10 NOTE — Telephone Encounter (Signed)
Patient states she was prescribed a pain patch yesterday and she took it off last night because she was having difficulty breathing. She would like to know how long the medication will remain in her system. Call back number is [phone].

## 2022-04-12 DIAGNOSIS — M542 Cervicalgia: Secondary | ICD-10-CM | POA: Diagnosis not present

## 2022-04-14 ENCOUNTER — Other Ambulatory Visit: Payer: Self-pay | Admitting: Internal Medicine

## 2022-04-14 DIAGNOSIS — I2510 Atherosclerotic heart disease of native coronary artery without angina pectoris: Secondary | ICD-10-CM

## 2022-04-14 DIAGNOSIS — I10 Essential (primary) hypertension: Secondary | ICD-10-CM

## 2022-04-16 ENCOUNTER — Encounter: Payer: PPO | Admitting: Physical Therapy

## 2022-04-16 DIAGNOSIS — M542 Cervicalgia: Secondary | ICD-10-CM | POA: Diagnosis not present

## 2022-04-18 ENCOUNTER — Encounter: Payer: PPO | Admitting: Physical Therapy

## 2022-04-23 ENCOUNTER — Telehealth: Payer: Self-pay | Admitting: Internal Medicine

## 2022-04-23 ENCOUNTER — Encounter: Payer: PPO | Admitting: Physical Therapy

## 2022-04-23 NOTE — Telephone Encounter (Signed)
Patient called and said that she is taking LORazepam (ATIVAN) 0.5 MG tablet and she feels like its not helping, She wants to know if she can switch to Xanax, She said she's taken it in the past. She said she's not really able to sleep.

## 2022-04-28 NOTE — Telephone Encounter (Signed)
Pharmacy Patient Advocate Encounter  Received notification from [employer] that the request for prior authorization for Ondansetron has been denied due to .    Please be advised we currently do not have a Pharmacist to review denials, therefore you will need to process appeals accordingly as needed. Thanks for your support at this time.

## 2022-04-29 DIAGNOSIS — M5412 Radiculopathy, cervical region: Secondary | ICD-10-CM | POA: Diagnosis not present

## 2022-04-29 DIAGNOSIS — M792 Neuralgia and neuritis, unspecified: Secondary | ICD-10-CM | POA: Insufficient documentation

## 2022-04-29 DIAGNOSIS — M47812 Spondylosis without myelopathy or radiculopathy, cervical region: Secondary | ICD-10-CM | POA: Diagnosis not present

## 2022-04-30 ENCOUNTER — Encounter: Payer: PPO | Admitting: Physical Therapy

## 2022-04-30 DIAGNOSIS — M5412 Radiculopathy, cervical region: Secondary | ICD-10-CM | POA: Diagnosis not present

## 2022-04-30 DIAGNOSIS — M542 Cervicalgia: Secondary | ICD-10-CM | POA: Diagnosis not present

## 2022-04-30 DIAGNOSIS — M79601 Pain in right arm: Secondary | ICD-10-CM | POA: Diagnosis not present

## 2022-05-02 ENCOUNTER — Encounter: Payer: PPO | Admitting: Physical Therapy

## 2022-05-04 ENCOUNTER — Other Ambulatory Visit: Payer: Self-pay | Admitting: Internal Medicine

## 2022-05-07 ENCOUNTER — Encounter: Payer: PPO | Admitting: Physical Therapy

## 2022-05-08 DIAGNOSIS — M5412 Radiculopathy, cervical region: Secondary | ICD-10-CM | POA: Diagnosis not present

## 2022-05-09 ENCOUNTER — Encounter: Payer: PPO | Admitting: Physical Therapy

## 2022-05-14 ENCOUNTER — Encounter: Payer: PPO | Admitting: Physical Therapy

## 2022-05-14 DIAGNOSIS — M79601 Pain in right arm: Secondary | ICD-10-CM | POA: Diagnosis not present

## 2022-05-14 DIAGNOSIS — M5412 Radiculopathy, cervical region: Secondary | ICD-10-CM | POA: Diagnosis not present

## 2022-05-14 DIAGNOSIS — M542 Cervicalgia: Secondary | ICD-10-CM | POA: Diagnosis not present

## 2022-05-16 ENCOUNTER — Encounter: Payer: PPO | Admitting: Physical Therapy

## 2022-05-21 DIAGNOSIS — M5412 Radiculopathy, cervical region: Secondary | ICD-10-CM | POA: Diagnosis not present

## 2022-05-21 DIAGNOSIS — M79601 Pain in right arm: Secondary | ICD-10-CM | POA: Diagnosis not present

## 2022-05-21 DIAGNOSIS — M542 Cervicalgia: Secondary | ICD-10-CM | POA: Diagnosis not present

## 2022-05-22 DIAGNOSIS — M5412 Radiculopathy, cervical region: Secondary | ICD-10-CM | POA: Diagnosis not present

## 2022-05-22 DIAGNOSIS — M542 Cervicalgia: Secondary | ICD-10-CM | POA: Diagnosis not present

## 2022-05-28 DIAGNOSIS — M542 Cervicalgia: Secondary | ICD-10-CM | POA: Diagnosis not present

## 2022-05-28 DIAGNOSIS — M79601 Pain in right arm: Secondary | ICD-10-CM | POA: Diagnosis not present

## 2022-05-28 DIAGNOSIS — M5412 Radiculopathy, cervical region: Secondary | ICD-10-CM | POA: Diagnosis not present

## 2022-06-04 DIAGNOSIS — M5412 Radiculopathy, cervical region: Secondary | ICD-10-CM | POA: Diagnosis not present

## 2022-06-04 DIAGNOSIS — M542 Cervicalgia: Secondary | ICD-10-CM | POA: Diagnosis not present

## 2022-06-04 DIAGNOSIS — M79601 Pain in right arm: Secondary | ICD-10-CM | POA: Diagnosis not present

## 2022-06-11 DIAGNOSIS — M79601 Pain in right arm: Secondary | ICD-10-CM | POA: Diagnosis not present

## 2022-06-11 DIAGNOSIS — M5412 Radiculopathy, cervical region: Secondary | ICD-10-CM | POA: Diagnosis not present

## 2022-06-11 DIAGNOSIS — M542 Cervicalgia: Secondary | ICD-10-CM | POA: Diagnosis not present

## 2022-06-18 DIAGNOSIS — Z1231 Encounter for screening mammogram for malignant neoplasm of breast: Secondary | ICD-10-CM | POA: Diagnosis not present

## 2022-06-18 DIAGNOSIS — Z01419 Encounter for gynecological examination (general) (routine) without abnormal findings: Secondary | ICD-10-CM | POA: Diagnosis not present

## 2022-06-18 DIAGNOSIS — Z01411 Encounter for gynecological examination (general) (routine) with abnormal findings: Secondary | ICD-10-CM | POA: Diagnosis not present

## 2022-06-18 DIAGNOSIS — Z6824 Body mass index (BMI) 24.0-24.9, adult: Secondary | ICD-10-CM | POA: Diagnosis not present

## 2022-06-18 DIAGNOSIS — N952 Postmenopausal atrophic vaginitis: Secondary | ICD-10-CM | POA: Diagnosis not present

## 2022-06-18 DIAGNOSIS — Z124 Encounter for screening for malignant neoplasm of cervix: Secondary | ICD-10-CM | POA: Diagnosis not present

## 2022-06-25 DIAGNOSIS — M542 Cervicalgia: Secondary | ICD-10-CM | POA: Diagnosis not present

## 2022-06-25 DIAGNOSIS — M79601 Pain in right arm: Secondary | ICD-10-CM | POA: Diagnosis not present

## 2022-06-25 DIAGNOSIS — M5412 Radiculopathy, cervical region: Secondary | ICD-10-CM | POA: Diagnosis not present

## 2022-07-02 DIAGNOSIS — D225 Melanocytic nevi of trunk: Secondary | ICD-10-CM | POA: Diagnosis not present

## 2022-07-03 ENCOUNTER — Encounter: Payer: Self-pay | Admitting: Internal Medicine

## 2022-07-03 ENCOUNTER — Ambulatory Visit (INDEPENDENT_AMBULATORY_CARE_PROVIDER_SITE_OTHER): Payer: PPO | Admitting: Internal Medicine

## 2022-07-03 VITALS — BP 130/82 | HR 66 | Temp 98.0°F | Ht 65.0 in | Wt 151.0 lb

## 2022-07-03 DIAGNOSIS — F411 Generalized anxiety disorder: Secondary | ICD-10-CM | POA: Diagnosis not present

## 2022-07-03 DIAGNOSIS — I10 Essential (primary) hypertension: Secondary | ICD-10-CM

## 2022-07-03 DIAGNOSIS — Z0000 Encounter for general adult medical examination without abnormal findings: Secondary | ICD-10-CM

## 2022-07-03 DIAGNOSIS — N1831 Chronic kidney disease, stage 3a: Secondary | ICD-10-CM | POA: Diagnosis not present

## 2022-07-03 DIAGNOSIS — E785 Hyperlipidemia, unspecified: Secondary | ICD-10-CM | POA: Diagnosis not present

## 2022-07-03 DIAGNOSIS — I2510 Atherosclerotic heart disease of native coronary artery without angina pectoris: Secondary | ICD-10-CM

## 2022-07-03 DIAGNOSIS — Z0001 Encounter for general adult medical examination with abnormal findings: Secondary | ICD-10-CM

## 2022-07-03 DIAGNOSIS — R7303 Prediabetes: Secondary | ICD-10-CM | POA: Diagnosis not present

## 2022-07-03 LAB — URINALYSIS, ROUTINE W REFLEX MICROSCOPIC
Bilirubin Urine: NEGATIVE
Hgb urine dipstick: NEGATIVE
Ketones, ur: NEGATIVE
Leukocytes,Ua: NEGATIVE
Nitrite: NEGATIVE
RBC / HPF: NONE SEEN (ref 0–?)
Specific Gravity, Urine: <=1.005 — AB (ref 1.000–1.030)
Total Protein, Urine: NEGATIVE
Urine Glucose: NEGATIVE
Urobilinogen, UA: 0.2 (ref 0.0–1.0)
pH: 6.5 (ref 5.0–8.0)

## 2022-07-03 LAB — BASIC METABOLIC PANEL
BUN: 19 mg/dL (ref 6–23)
CO2: 29 mEq/L (ref 19–32)
Calcium: 9.2 mg/dL (ref 8.4–10.5)
Chloride: 103 mEq/L (ref 96–112)
Creatinine, Ser: 0.88 mg/dL (ref 0.40–1.20)
GFR: 57.85 mL/min — ABNORMAL LOW (ref >60.00)
Glucose, Bld: 98 mg/dL (ref 70–99)
Potassium: 4.5 mEq/L (ref 3.5–5.1)
Sodium: 139 mEq/L (ref 135–145)

## 2022-07-03 LAB — LIPID PANEL
Cholesterol: 134 mg/dL (ref 0–200)
HDL: 48.4 mg/dL (ref >39.00)
LDL Cholesterol: 64 mg/dL (ref 0–99)
NonHDL: 85.66
Total CHOL/HDL Ratio: 3
Triglycerides: 107 mg/dL (ref 0.0–149.0)
VLDL: 21.4 mg/dL (ref 0.0–40.0)

## 2022-07-03 LAB — HEMOGLOBIN A1C: Hgb A1c MFr Bld: 6 % (ref 4.6–6.5)

## 2022-07-03 MED ORDER — ALPRAZOLAM 0.5 MG PO TABS: 0.5000 mg | ORAL_TABLET | Freq: Every day | ORAL | 1 refills | Status: DC

## 2022-07-03 NOTE — Progress Notes (Signed)
Subjective:  Patient ID: Krystal [name], female    DOB: [birth year]  Age: 89 y.o. MRN: [id number]  CC: Hypertension, Hyperlipidemia, and Annual Exam   HPI Krystal [name] presents for a CPX and f/up ---  She is only taking the hydralazine twice a day.  She is active and denies chest pain, shortness of breath, diaphoresis, or edema.  Outpatient Medications Prior to Visit  Medication Sig Dispense Refill   acetaminophen (TYLENOL) 500 MG tablet Take 500 mg by mouth every 4 (four) hours as needed for mild pain or headache.     albuterol (VENTOLIN HFA) 108 (90 Base) MCG/ACT inhaler TAKE 2 PUFFS BY MOUTH EVERY 6 HOURS AS NEEDED FOR WHEEZE OR SHORTNESS OF BREATH (Patient taking differently: Inhale 2 puffs into the lungs every 6 (six) hours as needed for shortness of breath or wheezing.) 8.5 each 1   aspirin EC 81 MG tablet Take 81 mg by mouth daily.     Calcium Carbonate Antacid (CALCIUM CARBONATE PO) Take 1 tablet by mouth daily.     Cholecalciferol (VITAMIN D-3 PO) Take 1 capsule by mouth daily.     cycloSPORINE (RESTASIS) 0.05 % ophthalmic emulsion Place 1 drop into both eyes 2 (two) times daily.     fluticasone (FLONASE) 50 MCG/ACT nasal spray INSTILL 1 SPRAY INTO BOTH NOSTRILS DAILY (Patient taking differently: Place 1 spray into both nostrils daily.) 16 mL 5   gabapentin (NEURONTIN) 100 MG capsule Take 100 mg by mouth at bedtime.     hydrALAZINE (APRESOLINE) 25 MG tablet TAKE 1 TABLET BY MOUTH THREE TIMES A DAY 270 tablet 1   lansoprazole (PREVACID) 15 MG capsule Take 1 capsule (15 mg total) by mouth 2 (two) times daily before a meal. 180 capsule 3   loratadine (CLARITIN) 10 MG tablet TAKE 1 TABLET BY MOUTH EVERY DAY (Patient taking differently: Take 10 mg by mouth daily.) 90 tablet 3   losartan (COZAAR) 50 MG tablet TAKE 1 TABLET BY MOUTH TWICE A DAY 180 tablet 1   metoprolol succinate (TOPROL-XL) 50 MG 24 hr tablet Take 1 tablet (50 mg total) by mouth daily. Take with or immediately following  a meal. 90 tablet 3   ondansetron (ZOFRAN-ODT) 4 MG disintegrating tablet TAKE 1 TABLET BY MOUTH EVERY 8 HOURS AS NEEDED FOR NAUSEA AND VOMITING 18 tablet 1   PREMARIN vaginal cream Place 1 Application vaginally See admin instructions. Apply vaginally 2-3 times a week as directed     rosuvastatin (CRESTOR) 10 MG tablet TAKE 1 TABLET BY MOUTH EVERY DAY 90 tablet 1   diazepam (VALIUM) 2 MG tablet Take 2 mg by mouth as needed.     LORazepam (ATIVAN) 0.5 MG tablet TAKE 1 TABLET BY MOUTH AT BEDTIME AS NEEDED FOR ANXIETY. (Patient taking differently: Take 0.5 mg by mouth at bedtime as needed for anxiety.) 90 tablet 1   pregabalin (LYRICA) 25 MG capsule Take 1 capsule (25 mg total) by mouth at bedtime. (Patient not taking: Reported on 07/03/2022) 90 capsule 1   docusate sodium (COLACE) 100 MG capsule Take 100 mg by mouth daily as needed (constipation). (Patient not taking: Reported on 07/03/2022)     DURAGESIC 25 MCG/HR Place 1 patch onto the skin every 3 (three) days. (Patient not taking: Reported on 07/03/2022) 10 patch 0   No facility-administered medications prior to visit.    ROS Review of Systems  Constitutional: Negative.  Negative for diaphoresis and fatigue.  HENT: Negative.    Eyes: Negative.  Respiratory: Negative.  Negative for cough, chest tightness, shortness of breath and wheezing.   Cardiovascular:  Negative for chest pain, palpitations and leg swelling.  Gastrointestinal: Negative.  Negative for abdominal pain, constipation, diarrhea, nausea and vomiting.  Endocrine: Negative.   Genitourinary: Negative.  Negative for difficulty urinating.  Musculoskeletal: Negative.   Skin: Negative.   Neurological: Negative.  Negative for dizziness and weakness.  Hematological:  Negative for adenopathy. Does not bruise/bleed easily.  Psychiatric/Behavioral:  Positive for sleep disturbance. Negative for confusion, decreased concentration and dysphoric mood. The patient is nervous/anxious.         Anxiety keeps her awake at night    Objective:  BP 130/82 (BP Location: Left Arm, Patient Position: Sitting, Cuff Size: Normal)   Pulse 66   Temp 98 F (36.7 C) (Oral)   Ht 5\' 5"  (1.651 m)   Wt 151 lb (68.5 kg)   SpO2 97%   BMI 25.13 kg/m   BP Readings from Last 3 Encounters:  07/03/22 130/82  04/09/22 136/80  04/08/22 (!) 169/78    Wt Readings from Last 3 Encounters:  07/03/22 151 lb (68.5 kg)  04/09/22 145 lb (65.8 kg)  04/04/22 147 lb 8 oz (66.9 kg)    Physical Exam Vitals reviewed.  HENT:     Nose: Nose normal.     Mouth/Throat:     Mouth: Mucous membranes are moist.  Eyes:     General: No scleral icterus.    Conjunctiva/sclera: Conjunctivae normal.  Cardiovascular:     Rate and Rhythm: Normal rate and regular rhythm.     Heart sounds: No murmur heard. Pulmonary:     Effort: Pulmonary effort is normal.     Breath sounds: No stridor. No wheezing, rhonchi or rales.  Abdominal:     General: Abdomen is flat.     Palpations: There is no mass.     Tenderness: There is no abdominal tenderness. There is no guarding.     Hernia: No hernia is present.  Musculoskeletal:        General: Normal range of motion.     Cervical back: Neck supple.     Right lower leg: No edema.     Left lower leg: No edema.  Lymphadenopathy:     Cervical: No cervical adenopathy.  Skin:    General: Skin is warm and dry.  Neurological:     General: No focal deficit present.     Mental Status: She is alert. Mental status is at baseline.  Psychiatric:        Mood and Affect: Mood normal.        Behavior: Behavior normal.     Lab Results  Component Value Date   WBC 7.4 04/08/2022   HGB 13.2 04/08/2022   HCT 41.1 04/08/2022   PLT 269 04/08/2022   GLUCOSE 98 07/03/2022   CHOL 134 07/03/2022   TRIG 107.0 07/03/2022   HDL 48.40 07/03/2022   LDLDIRECT 160.4 12/19/2008   LDLCALC 64 07/03/2022   ALT 14 04/08/2022   AST 17 04/08/2022   NA 139 07/03/2022   K 4.5 07/03/2022   CL 103  07/03/2022   CREATININE 0.88 07/03/2022   BUN 19 07/03/2022   CO2 29 07/03/2022   TSH 2.260 01/26/2022   HGBA1C 6.0 07/03/2022    DG Chest 2 View  Result Date: 04/08/2022 CLINICAL DATA:  Weakness. EXAM: CHEST - 2 VIEW COMPARISON:  01/26/2022 FINDINGS: The heart size is normal. Stable tortuosity of the thoracic aorta. There  is no evidence of pulmonary edema, consolidation, pneumothorax, nodule or pleural fluid. Stable osteopenia of the thoracic spine with mild loss of height of several midthoracic vertebral bodies. IMPRESSION: No active cardiopulmonary disease. Stable osteopenia of the thoracic spine with mild loss of height of several midthoracic vertebral bodies. Electronically Signed   By: Irish Lack M.D.   On: 04/08/2022 16:55    Assessment & Plan:  Essential hypertension- Her BP is well controlled. -     Basic metabolic panel; Future -     Urinalysis, Routine w reflex microscopic; Future  Encounter for general adult medical examination with abnormal findings- Exam completed, labs reviewed, vaccines reviewed and updated, no cancer screenings indicated, patient education was given.  Prediabetes -     Basic metabolic panel; Future -     Hemoglobin A1c; Future  CAD S/P percutaneous coronary angioplasty  Stage 3a chronic kidney disease- Her renal function is stable. -     Basic metabolic panel; Future -     Urinalysis, Routine w reflex microscopic; Future  Hyperlipidemia with target LDL less than 70 - LDL goal achieved. Doing well on the statin. -     Lipid panel; Future  GAD (generalized anxiety disorder) -     ALPRAZolam; Take 1 tablet (0.5 mg total) by mouth at bedtime.  Dispense: 90 tablet; Refill: 1     Follow-up: Return in about 6 months (around 01/02/2023).  Sanda Linger, MD

## 2022-07-03 NOTE — Patient Instructions (Signed)

## 2022-08-01 ENCOUNTER — Ambulatory Visit: Payer: PPO

## 2022-08-01 VITALS — Ht 65.0 in | Wt 151.0 lb

## 2022-08-01 NOTE — Progress Notes (Unsigned)
Subjective:   Krystal [name] is a 89 y.o. female who presents for Medicare Annual (Subsequent) preventive examination.  Review of Systems          Objective:    There were no vitals filed for this visit. There is no height or weight on file to calculate BMI.     04/08/2022    3:50 PM 04/02/2022    9:38 AM 03/29/2022    8:49 AM 03/21/2022   11:22 PM 01/27/2022    8:48 AM 01/26/2022    3:46 PM 01/22/2022    9:30 AM  Advanced Directives  Does Patient Have a Medical Advance Directive? No No No No Yes Yes Yes  Type of Primary school teacher of [place];Living will Living will;Healthcare Power of Attorney  Does patient want to make changes to medical advance directive?     No - Patient declined    Copy of Healthcare Power of Attorney in Chart?  No - copy requested   No - copy requested  No - copy requested  Would patient like information on creating a medical advance directive?  No - Patient declined No - Patient declined        Current Medications (verified) Outpatient Encounter Medications as of 08/01/2022  Medication Sig   acetaminophen (TYLENOL) 500 MG tablet Take 500 mg by mouth every 4 (four) hours as needed for mild pain or headache.   albuterol (VENTOLIN HFA) 108 (90 Base) MCG/ACT inhaler TAKE 2 PUFFS BY MOUTH EVERY 6 HOURS AS NEEDED FOR WHEEZE OR SHORTNESS OF BREATH (Patient taking differently: Inhale 2 puffs into the lungs every 6 (six) hours as needed for shortness of breath or wheezing.)   ALPRAZolam (XANAX) 0.5 MG tablet Take 1 tablet (0.5 mg total) by mouth at bedtime.   aspirin EC 81 MG tablet Take 81 mg by mouth daily.   Calcium Carbonate Antacid (CALCIUM CARBONATE PO) Take 1 tablet by mouth daily.   Cholecalciferol (VITAMIN D-3 PO) Take 1 capsule by mouth daily.   cycloSPORINE (RESTASIS) 0.05 % ophthalmic emulsion Place 1 drop into both eyes 2 (two) times daily.   fluticasone (FLONASE) 50 MCG/ACT nasal spray INSTILL 1 SPRAY  INTO BOTH NOSTRILS DAILY (Patient taking differently: Place 1 spray into both nostrils daily.)   gabapentin (NEURONTIN) 100 MG capsule Take 100 mg by mouth at bedtime.   hydrALAZINE (APRESOLINE) 25 MG tablet TAKE 1 TABLET BY MOUTH THREE TIMES A DAY   lansoprazole (PREVACID) 15 MG capsule Take 1 capsule (15 mg total) by mouth 2 (two) times daily before a meal.   loratadine (CLARITIN) 10 MG tablet TAKE 1 TABLET BY MOUTH EVERY DAY (Patient taking differently: Take 10 mg by mouth daily.)   losartan (COZAAR) 50 MG tablet TAKE 1 TABLET BY MOUTH TWICE A DAY   metoprolol succinate (TOPROL-XL) 50 MG 24 hr tablet Take 1 tablet (50 mg total) by mouth daily. Take with or immediately following a meal.   ondansetron (ZOFRAN-ODT) 4 MG disintegrating tablet TAKE 1 TABLET BY MOUTH EVERY 8 HOURS AS NEEDED FOR NAUSEA AND VOMITING   pregabalin (LYRICA) 25 MG capsule Take 1 capsule (25 mg total) by mouth at bedtime. (Patient not taking: Reported on 07/03/2022)   PREMARIN vaginal cream Place 1 Application vaginally See admin instructions. Apply vaginally 2-3 times a week as directed   rosuvastatin (CRESTOR) 10 MG tablet TAKE 1 TABLET BY MOUTH EVERY DAY   No facility-administered encounter medications on file  as of 08/01/2022.    Allergies (verified) Lipitor [atorvastatin], Zocor [simvastatin], Doxycycline, Fosamax [alendronate], Levaquin [levofloxacin], Nexium [esomeprazole], Omnicef [cefdinir], Penicillins, Pneumococcal vaccine, Pneumovax [pneumococcal polysaccharide vaccine], Prilosec [omeprazole], Norvasc [amlodipine], Shellfish allergy, and Shellfish-derived products   History: Past Medical History:  Diagnosis Date   Adenomatous colon polyp    Allergy    seasonal   Anemia    pt. denies   Anxiety    CAD (coronary artery disease)    Cataract    cataracts removed left eye.   Chronic lower back pain    Diverticulosis    GERD (gastroesophageal reflux disease)    Headache(784.0)    Hiatal hernia     Hyperlipidemia    Hypertension    IBS (irritable bowel syndrome) 10/13/2011   Osteoarthritis    Osteoporosis    Renal cysts, acquired, bilateral    SBO (small bowel obstruction) (HCC) 09/05/2015   Venous insufficiency    Past Surgical History:  Procedure Laterality Date   ABDOMINAL HYSTERECTOMY     BLADDER REPAIR     CARDIAC CATHETERIZATION  01/12/2002   90% stenosis of the left circumflex, stented with a 3x44mm Cypher stent, resulting in reduction of 90% to 10%    CARDIOVASCULAR STRESS TEST  03/22/2007   Mild inferolateral thinning toward the apex without significant ischemia. Nondiagnostic electrocardiogram.   CATARACT EXTRACTION     bilateral   COLONOSCOPY  08/17/2008   adenomatous polyp, diverticulosis, external hemorrhoids   COLONOSCOPY W/ BIOPSIES     multiple    LEFT LOWER EXTREMITY VENOUS DOPPLER  06/18/2011   No evidence of left lower extremity DVT   TRANSTHORACIC ECHOCARDIOGRAM  11/18/2012   EF 40-45%, LV systolic mild-moderately reduced, mild-moderate mitral valve regurg, mild-moderate tricuspid valve regurg. NSR-LBBB with occas PVCs   UPPER GASTROINTESTINAL ENDOSCOPY  07/03/2010   hiatal hernia   VARICOSE VEIN SURGERY     Family History  Problem Relation Age of Onset   Heart disease Father    Stroke Sister    Colon cancer Neg Hx    Social History   Socioeconomic History   Marital status: Married    Spouse name: Chrissie Noa   Number of children: Not on file   Years of education: Not on file   Highest education level: Not on file  Occupational History   Occupation: retired  Tobacco Use   Smoking status: Never   Smokeless tobacco: Never  Vaping Use   Vaping Use: Never used  Substance and Sexual Activity   Alcohol use: No    Alcohol/week: 0.0 standard drinks of alcohol   Drug use: No   Sexual activity: Not on file  Other Topics Concern   Not on file  Social History Narrative   Married to Sorrel who has congestive heart failure problems   She is retired never  smoker rare alcohol   Social Determinants of Corporate investment banker Strain: Low Risk  (07/29/2021)   Overall Financial Resource Strain (CARDIA)    Difficulty of Paying Living Expenses: Not hard at all  Food Insecurity: No Food Insecurity (01/27/2022)   Hunger Vital Sign    Worried About Running Out of Food in the Last Year: Never true    Ran Out of Food in the Last Year: Never true  Transportation Needs: No Transportation Needs (01/27/2022)   PRAPARE - Administrator, Civil Service (Medical): No    Lack of Transportation (Non-Medical): No  Physical Activity: Sufficiently Active (07/29/2021)   Exercise Vital Sign  Days of Exercise per Week: 5 days    Minutes of Exercise per Session: 30 min  Stress: No Stress Concern Present (07/29/2021)   [employer] of Occupational Health - Occupational Stress Questionnaire    Feeling of Stress : Not at all  Social Connections: Moderately Isolated (07/29/2021)   Social Connection and Isolation Panel [NHANES]    Frequency of Communication with Friends and Family: More than three times a week    Frequency of Social Gatherings with Friends and Family: More than three times a week    Attends Religious Services: Never    Database administrator or Organizations: No    Attends Banker Meetings: Never    Marital Status: Married    Tobacco Counseling Counseling given: Not Answered   Clinical Intake:                 Diabetic?         Activities of Daily Living    01/27/2022    8:48 AM  In your present state of health, do you have any difficulty performing the following activities:  Hearing? 0  Vision? 0  Difficulty concentrating or making decisions? 0  Walking or climbing stairs? 0  Dressing or bathing? 0  Doing errands, shopping? 0    Patient Care Team: Etta Grandchild, MD as PCP - General (Internal Medicine) Runell Gess, MD as PCP - Cardiology (Cardiology) Kathyrn Sheriff, [facility] as  Pharmacist (Pharmacist) Szabat, Vinnie Level, [facility] (Inactive) (Pharmacist)  Indicate any recent Medical Services you may have received from other than [facility] providers in the past year (date may be approximate).     Assessment:   This is a routine wellness examination for [facility].  Hearing/Vision screen No results found.  Dietary issues and exercise activities discussed:     Goals Addressed   None    Depression Screen    07/03/2022   10:23 AM 04/04/2022    3:49 PM 11/08/2021    2:06 PM 07/29/2021   11:10 AM 10/24/2020    2:43 PM 05/08/2020    9:27 AM 12/29/2018    9:12 AM  PHQ 2/9 Scores  PHQ - 2 Score 1 0 0 0 0 0 0  PHQ- 9 Score  0 0        Fall Risk    07/03/2022   10:23 AM 04/04/2022    3:49 PM 11/08/2021    2:06 PM 07/29/2021   11:13 AM 10/24/2020    2:43 PM  Fall Risk   Falls in the past year? 0 0 0 0 0  Number falls in past yr: 0 0 0 0 0  Injury with Fall? 0 0 0 0 0  Risk for fall due to : No Fall Risks  No Fall Risks No Fall Risks   Follow up Falls evaluation completed Falls evaluation completed Falls evaluation completed      FALL RISK PREVENTION PERTAINING TO THE HOME:  Any stairs in or around the home?  If so, are there any without handrails?  Home free of loose throw rugs in walkways, pet beds, electrical cords, etc?  Adequate lighting in your home to reduce risk of fa  ASSISTIVE DEVICES UTILIZED TO PREVENT FALLS:  Life alert?  Use of a cane, walker or w/ Grab bars in the bathroom?  Shower chair or bench in shower?  Elevated toilet seat or a handicapped toilet?   TIMED UP AND GO:  Was the test performed?  sec.  Cognitive Function:        07/29/2021   11:14 AM  6CIT Screen  What Year? 0 points  What month? 0 points  What time? 0 points  Count back from 20 0 points  Months in reverse 0 points  Repeat phrase 0 points  Total Score 0 points    Immunizations Immunization History  Administered Date(s) Administered   Fluad Quad(high Dose 65+)  12/31/2018, 01/13/2020, 01/17/2021, 01/03/2022   Influenza Split 02/04/2011, 01/22/2012   Influenza Whole 01/12/2008, 02/08/2009, 02/05/2010   Influenza, High Dose Seasonal PF 01/17/2016, 01/20/2017, 01/20/2018   Influenza,inj,Quad PF,6+ Mos 01/06/2013, 01/20/2014   Influenza-Unspecified 01/08/2015   PFIZER(Purple Top)SARS-COV-2 Vaccination 04/10/2019, 05/01/2019, 07/17/2020   Tdap 10/03/2015, 11/08/2021   Zoster Recombinat (Shingrix) 11/26/2018, 02/15/2019   Zoster, Live 04/16/2012            Qualifies    Screening Tests Health Maintenance  Topic Date Due   Pneumonia Vaccine 36+ Years old (1 of 1 - PCV) Never done   Medicare Annual Wellness (AWV)  07/30/2022   INFLUENZA VACCINE  10/30/2022   DTaP/Tdap/Td (3 - Td or Tdap) 11/09/2031   DEXA SCAN  Completed   Zoster Vaccines- Shingrix  Completed   HPV VACCINES  Aged Out   COVID-19 Vaccine  Discontinued    Health Maintenance  Health Maintenance Due  Topic Date Due   Pneumonia Vaccine 33+ Years old (1 of 1 - PCV) Never done   Medicare Annual Wellness (AWV)  07/30/2022         Lung Cancer Screening: (Low Dose CT Chest recommended if Age 34-80 years, 30 pack-year currently smoking OR have quit w/in 15years.) qualify.   Lung Cancer Screening Referral:   Additional Screening:  Hepatitis C Screening: alify; Completed   Vision Screening: Recommended annual ophthalmology exams for early detection of glaucoma and other disorders of the eye. Is the patient up to date with their annual eye exam?   Who is the provider or what is the name of the office in which the patient attends annual eye exams If pt is not established with a provider, would they like to be referred to a provider to establish care?  Dental Screening: Recommended annual dental exams for proper oral hygiene  Community Resource Referral / Chronic Care Management: CRR required this visit?    CCM required this visit?      Plan:     I have  personally reviewed and noted the following in the patient's chart:   Medical and social history Use of alcohol, tobacco or illicit drugs  Current medications and supplements including opioid prescriptions.  Functional ability and status Nutritional status Physical activity Advanced directives List of other physicians Hospitalizations, surgeries, and ER visits in previous 12 months Vitals Screenings to include cognitive, depression, and falls Referrals and appointments  In addition, I have reviewed and discussed with patient certain preventive protocols, quality metrics, and best practice recommendations. A written personalized care plan for preventive services as well as general preventive health recommendations were provided to patient.     Milus Mallick, CMA   08/01/2022   Nurse Notes: Due to restarting of system, visit was unable to be completed patient had left home within moments of return call for completion.

## 2022-08-06 ENCOUNTER — Other Ambulatory Visit: Payer: Self-pay | Admitting: Internal Medicine

## 2022-08-06 DIAGNOSIS — I420 Dilated cardiomyopathy: Secondary | ICD-10-CM

## 2022-08-06 DIAGNOSIS — I10 Essential (primary) hypertension: Secondary | ICD-10-CM

## 2022-08-06 DIAGNOSIS — I2510 Atherosclerotic heart disease of native coronary artery without angina pectoris: Secondary | ICD-10-CM

## 2022-08-11 ENCOUNTER — Telehealth: Payer: Self-pay | Admitting: Internal Medicine

## 2022-08-11 NOTE — Telephone Encounter (Signed)
Contacted [name] to schedule their annual wellness visit. Appointment made for 08/22/2022.  [facility] Care Guide [facility] AWV TEAM Direct Dial: [phone]

## 2022-08-18 ENCOUNTER — Ambulatory Visit: Payer: PPO | Admitting: Podiatry

## 2022-08-18 ENCOUNTER — Telehealth: Payer: Self-pay | Admitting: Podiatry

## 2022-08-18 ENCOUNTER — Other Ambulatory Visit: Payer: Self-pay | Admitting: Podiatry

## 2022-08-18 ENCOUNTER — Ambulatory Visit (INDEPENDENT_AMBULATORY_CARE_PROVIDER_SITE_OTHER): Payer: PPO

## 2022-08-18 DIAGNOSIS — M84374A Stress fracture, right foot, initial encounter for fracture: Secondary | ICD-10-CM | POA: Diagnosis not present

## 2022-08-18 DIAGNOSIS — B351 Tinea unguium: Secondary | ICD-10-CM

## 2022-08-18 DIAGNOSIS — M79671 Pain in right foot: Secondary | ICD-10-CM

## 2022-08-18 MED ORDER — TAVABOROLE 5 % EX SOLN: 1.0000 [drp] | Freq: Every day | CUTANEOUS | 2 refills | Status: DC

## 2022-08-18 NOTE — Patient Instructions (Signed)
[facility] [phone]

## 2022-08-18 NOTE — Telephone Encounter (Signed)
[employer] called requesting authorization with topical medication. Please call back. [phone] option 2

## 2022-08-18 NOTE — Progress Notes (Signed)
Subjective:   Patient ID: Krystal [name], female   DOB: 89 y.o.   MRN: [id number]   HPI Chief Complaint  Patient presents with   Foot Pain    Right foot pain for about a month from the heel to center of her foot  Pt stated that she is also concerned about nail fungus on right hallux toe and the 2nd toe as well, she stated that she has tried everything over the counter but nothing seems to help     89 year old female presents with above concerns.  She states that she bleeding pain and some swelling particularly when she does a lot of walking on her feet a lot.  She does take Advil which helps some.  No injuries that she reports.    She has started to get neuropathy pain as well. She is on gabapentin 100mg  at night.   She keeps a spacer between her 1st and 2nd toe due to the bunion.   Also concern about nail fungus.  It is on the first and second toes of the right foot.  No pain.  No swelling, redness or any drainage.   Review of Systems  All other systems reviewed and are negative.  Past Medical History:  Diagnosis Date   Adenomatous colon polyp    Allergy    seasonal   Anemia    pt. denies   Anxiety    CAD (coronary artery disease)    Cataract    cataracts removed left eye.   Chronic lower back pain    Diverticulosis    GERD (gastroesophageal reflux disease)    Headache(784.0)    Hiatal hernia    Hyperlipidemia    Hypertension    IBS (irritable bowel syndrome) 10/13/2011   Osteoarthritis    Osteoporosis    Renal cysts, acquired, bilateral    SBO (small bowel obstruction) (HCC) 09/05/2015   Venous insufficiency     Past Surgical History:  Procedure Laterality Date   ABDOMINAL HYSTERECTOMY     BLADDER REPAIR     CARDIAC CATHETERIZATION  01/12/2002   90% stenosis of the left circumflex, stented with a 3x60mm Cypher stent, resulting in reduction of 90% to 10%    CARDIOVASCULAR STRESS TEST  03/22/2007   Mild inferolateral thinning toward the apex without significant  ischemia. Nondiagnostic electrocardiogram.   CATARACT EXTRACTION     bilateral   COLONOSCOPY  08/17/2008   adenomatous polyp, diverticulosis, external hemorrhoids   COLONOSCOPY W/ BIOPSIES     multiple    LEFT LOWER EXTREMITY VENOUS DOPPLER  06/18/2011   No evidence of left lower extremity DVT   TRANSTHORACIC ECHOCARDIOGRAM  11/18/2012   EF 40-45%, LV systolic mild-moderately reduced, mild-moderate mitral valve regurg, mild-moderate tricuspid valve regurg. NSR-LBBB with occas PVCs   UPPER GASTROINTESTINAL ENDOSCOPY  07/03/2010   hiatal hernia   VARICOSE VEIN SURGERY       Current Outpatient Medications:    Tavaborole (KERYDIN) 5 % SOLN, Apply 1 drop topically daily. Apply 1 drop to the toenail daily., Disp: 10 mL, Rfl: 2   acetaminophen (TYLENOL) 500 MG tablet, Take 500 mg by mouth every 4 (four) hours as needed for mild pain or headache., Disp: , Rfl:    albuterol (VENTOLIN HFA) 108 (90 Base) MCG/ACT inhaler, TAKE 2 PUFFS BY MOUTH EVERY 6 HOURS AS NEEDED FOR WHEEZE OR SHORTNESS OF BREATH (Patient taking differently: Inhale 2 puffs into the lungs every 6 (six) hours as needed for shortness of breath or wheezing.),  Disp: 8.5 each, Rfl: 1   ALPRAZolam (XANAX) 0.5 MG tablet, Take 1 tablet (0.5 mg total) by mouth at bedtime., Disp: 90 tablet, Rfl: 1   aspirin EC 81 MG tablet, Take 81 mg by mouth daily., Disp: , Rfl:    Calcium Carbonate Antacid (CALCIUM CARBONATE PO), Take 1 tablet by mouth daily., Disp: , Rfl:    Cholecalciferol (VITAMIN D-3 PO), Take 1 capsule by mouth daily., Disp: , Rfl:    cycloSPORINE (RESTASIS) 0.05 % ophthalmic emulsion, Place 1 drop into both eyes 2 (two) times daily., Disp: , Rfl:    fluticasone (FLONASE) 50 MCG/ACT nasal spray, INSTILL 1 SPRAY INTO BOTH NOSTRILS DAILY (Patient taking differently: Place 1 spray into both nostrils daily.), Disp: 16 mL, Rfl: 5   gabapentin (NEURONTIN) 100 MG capsule, Take 100 mg by mouth at bedtime., Disp: , Rfl:    hydrALAZINE  (APRESOLINE) 25 MG tablet, TAKE 1 TABLET BY MOUTH THREE TIMES A DAY, Disp: 270 tablet, Rfl: 1   lansoprazole (PREVACID) 15 MG capsule, Take 1 capsule (15 mg total) by mouth 2 (two) times daily before a meal., Disp: 180 capsule, Rfl: 3   loratadine (CLARITIN) 10 MG tablet, TAKE 1 TABLET BY MOUTH EVERY DAY (Patient taking differently: Take 10 mg by mouth daily.), Disp: 90 tablet, Rfl: 3   losartan (COZAAR) 50 MG tablet, TAKE 1 TABLET BY MOUTH TWICE A DAY, Disp: 180 tablet, Rfl: 1   metoprolol succinate (TOPROL-XL) 50 MG 24 hr tablet, Take 1 tablet (50 mg total) by mouth daily. Take with or immediately following a meal., Disp: 90 tablet, Rfl: 3   ondansetron (ZOFRAN-ODT) 4 MG disintegrating tablet, TAKE 1 TABLET BY MOUTH EVERY 8 HOURS AS NEEDED FOR NAUSEA AND VOMITING, Disp: 18 tablet, Rfl: 1   pregabalin (LYRICA) 25 MG capsule, Take 1 capsule (25 mg total) by mouth at bedtime. (Patient not taking: Reported on 07/03/2022), Disp: 90 capsule, Rfl: 1   PREMARIN vaginal cream, Place 1 Application vaginally See admin instructions. Apply vaginally 2-3 times a week as directed, Disp: , Rfl:    rosuvastatin (CRESTOR) 10 MG tablet, TAKE 1 TABLET BY MOUTH EVERY DAY, Disp: 90 tablet, Rfl: 1  Allergies  Allergen Reactions   Lipitor [Atorvastatin] Other (See Comments)    Myalgias    Zocor [Simvastatin] Other (See Comments)    Myalgias    Doxycycline     diarrhea   Fosamax [Alendronate] Other (See Comments)    Esophagitis   Levaquin [Levofloxacin] Diarrhea   Nexium [Esomeprazole] Nausea Only and Other (See Comments)    Stomach pains/burning sensation   Omnicef [Cefdinir] Diarrhea   Penicillins Hives   Pneumococcal Vaccine Other (See Comments)    Fatigue and aches that lasted 6+ months   Pneumovax [Pneumococcal Polysaccharide Vaccine]    Prilosec [Omeprazole] Nausea Only and Other (See Comments)    Stomach pains/burning sensation   Norvasc [Amlodipine] Swelling    Ankle edema   Shellfish Allergy Hives,  Nausea Only and Rash   Shellfish-Derived Products Hives, Nausea Only and Rash          Objective:  Physical Exam  General: AAO x3, NAD  Dermatological: Right hallux, second nails are hypertrophic, dystrophic with yellow, brown discoloration.  No edema, erythema or signs of infection.  No open lesions.  Vascular: Dorsalis Pedis artery and Posterior Tibial artery pedal pulses are 2/4 bilateral with immedate capillary fill time.  There is no pain with calf compression, swelling, warmth, erythema.   Neruologic: Grossly intact via  light touch bilateral.   Musculoskeletal: She does get tenderness directly along the palpation of the right second third metatarsals dorsally.  Subjectively she is getting discomfort on the arch of the foot but no significant pain identified to this area today.  There is no other areas of tenderness.  Gait: Unassisted, Nonantalgic.      Assessment:   89 year old female concern for stress fracture right foot, onychomycosis     Plan:  -Treatment options discussed including all alternatives, risks, and complications -Etiology of symptoms were discussed -X-rays were obtained and reviewed with the patient.  There is some radiodensity, edema noted on the left second and third metatarsals and concern concerning for stress fracture. -Given her symptoms recommend immobilization in surgical shoe was dispensed today.  Ice, elevation. -Prescribed Kerydin for nail fungus  Return for 2-3 weeks for stress fracture right foot, x-ray .  Vivi Barrack DPM

## 2022-08-19 ENCOUNTER — Telehealth: Payer: Self-pay | Admitting: *Deleted

## 2022-08-19 NOTE — Telephone Encounter (Signed)
Patient is calling because she has tried calling the company for the toenail treatment ,no success, please send medicine to pharmacy. I explained that  the prescription was sent to pharmacy  on file the 20 th.She is also wanting to ask the physician if she can use a stay in place pedal machine while sitting. Please advise.

## 2022-08-19 NOTE — Telephone Encounter (Signed)
Sorry, not an authorization number but a case number

## 2022-08-22 ENCOUNTER — Ambulatory Visit (INDEPENDENT_AMBULATORY_CARE_PROVIDER_SITE_OTHER): Payer: PPO

## 2022-08-22 ENCOUNTER — Other Ambulatory Visit: Payer: Self-pay | Admitting: Podiatry

## 2022-08-22 DIAGNOSIS — Z0000 Encounter for general adult medical examination without abnormal findings: Secondary | ICD-10-CM | POA: Diagnosis not present

## 2022-08-22 MED ORDER — CICLOPIROX 8 % EX SOLN: Freq: Every day | CUTANEOUS | 2 refills | Status: DC

## 2022-08-22 NOTE — Progress Notes (Signed)
I connected with  Krystal [name] on 08/22/22 by a audio enabled telemedicine application and verified that I am speaking with the correct person using two identifiers.  Patient Location: Home  Provider Location: Home Office  I discussed the limitations of evaluation and management by telemedicine. The patient expressed understanding and agreed to proceed.   Subjective:   Krystal [name] is a 89 y.o. female who presents for Medicare Annual (Subsequent) preventive examination.  Review of Systems    Per HPI unless specifically indicated below.  Cardiac Risk Factors include: advanced age (>24men, >82 women);female gender, Essential Hypertension, Chronic CHF, and CAD coronary artery disease .           Objective:       08/01/2022    2:37 PM 07/03/2022   10:23 AM 04/09/2022    7:55 AM  Vitals with BMI  Height 5\' 5"  5\' 5"  5\' 5"   Weight 151 lbs 151 lbs 145 lbs  BMI 25.13 25.13 24.13  Systolic  130 136  Diastolic  82 80  Pulse  66 69    There were no vitals filed for this visit. There is no height or weight on file to calculate BMI.     08/22/2022   11:40 AM 08/01/2022    2:42 PM 04/08/2022    3:50 PM 04/02/2022    9:38 AM 03/29/2022    8:49 AM 03/21/2022   11:22 PM 01/27/2022    8:48 AM  Advanced Directives  Does Patient Have a Medical Advance Directive? Yes Yes No No No No Yes  Type of Estate agent of [place];Living will Healthcare Power of [place];Living will     Healthcare Power of Attorney  Does patient want to make changes to medical advance directive? No - Patient declined      No - Patient declined  Copy of Healthcare Power of Attorney in Chart? No - copy requested No - copy requested  No - copy requested   No - copy requested  Would patient like information on creating a medical advance directive?    No - Patient declined No - Patient declined      Current Medications (verified) Outpatient Encounter Medications as of 08/22/2022  Medication Sig    acetaminophen (TYLENOL) 500 MG tablet Take 500 mg by mouth every 4 (four) hours as needed for mild pain or headache.   albuterol (VENTOLIN HFA) 108 (90 Base) MCG/ACT inhaler TAKE 2 PUFFS BY MOUTH EVERY 6 HOURS AS NEEDED FOR WHEEZE OR SHORTNESS OF BREATH (Patient taking differently: Inhale 2 puffs into the lungs every 6 (six) hours as needed for shortness of breath or wheezing.)   ALPRAZolam (XANAX) 0.5 MG tablet Take 1 tablet (0.5 mg total) by mouth at bedtime.   aspirin EC 81 MG tablet Take 81 mg by mouth daily.   Calcium Carbonate Antacid (CALCIUM CARBONATE PO) Take 1 tablet by mouth daily.   Cholecalciferol (VITAMIN D-3 PO) Take 1 capsule by mouth daily.   cycloSPORINE (RESTASIS) 0.05 % ophthalmic emulsion Place 1 drop into both eyes 2 (two) times daily.   fluticasone (FLONASE) 50 MCG/ACT nasal spray INSTILL 1 SPRAY INTO BOTH NOSTRILS DAILY (Patient taking differently: Place 1 spray into both nostrils daily.)   gabapentin (NEURONTIN) 100 MG capsule Take 100 mg by mouth at bedtime.   hydrALAZINE (APRESOLINE) 25 MG tablet TAKE 1 TABLET BY MOUTH THREE TIMES A DAY (Patient taking differently: Take 25 mg by mouth in the morning and at bedtime.)  lansoprazole (PREVACID) 15 MG capsule Take 1 capsule (15 mg total) by mouth 2 (two) times daily before a meal.   loratadine (CLARITIN) 10 MG tablet TAKE 1 TABLET BY MOUTH EVERY DAY (Patient taking differently: Take 10 mg by mouth daily.)   losartan (COZAAR) 50 MG tablet TAKE 1 TABLET BY MOUTH TWICE A DAY   metoprolol succinate (TOPROL-XL) 50 MG 24 hr tablet Take 1 tablet (50 mg total) by mouth daily. Take with or immediately following a meal.   ondansetron (ZOFRAN-ODT) 4 MG disintegrating tablet TAKE 1 TABLET BY MOUTH EVERY 8 HOURS AS NEEDED FOR NAUSEA AND VOMITING   PREMARIN vaginal cream Place 1 Application vaginally See admin instructions. Apply vaginally 2-3 times a week as directed   rosuvastatin (CRESTOR) 10 MG tablet TAKE 1 TABLET BY MOUTH EVERY DAY    pregabalin (LYRICA) 25 MG capsule Take 1 capsule (25 mg total) by mouth at bedtime. (Patient not taking: Reported on 08/22/2022)   Tavaborole (KERYDIN) 5 % SOLN Apply 1 drop topically daily. Apply 1 drop to the toenail daily. (Patient not taking: Reported on 08/22/2022)   No facility-administered encounter medications on file as of 08/22/2022.    Allergies (verified) Lipitor [atorvastatin], Zocor [simvastatin], Doxycycline, Fosamax [alendronate], Levaquin [levofloxacin], Nexium [esomeprazole], Omnicef [cefdinir], Penicillins, Pneumococcal vaccine, Pneumovax [pneumococcal polysaccharide vaccine], Prilosec [omeprazole], Norvasc [amlodipine], Shellfish allergy, and Shellfish-derived products   History: Past Medical History:  Diagnosis Date   Adenomatous colon polyp    Allergy    seasonal   Anemia    pt. denies   Anxiety    CAD (coronary artery disease)    Cataract    cataracts removed left eye.   Chronic lower back pain    Diverticulosis    GERD (gastroesophageal reflux disease)    Headache(784.0)    Hiatal hernia    Hyperlipidemia    Hypertension    IBS (irritable bowel syndrome) 10/13/2011   Osteoarthritis    Osteoporosis    Renal cysts, acquired, bilateral    SBO (small bowel obstruction) (HCC) 09/05/2015   Venous insufficiency    Past Surgical History:  Procedure Laterality Date   ABDOMINAL HYSTERECTOMY     BLADDER REPAIR     CARDIAC CATHETERIZATION  01/12/2002   90% stenosis of the left circumflex, stented with a 3x10mm Cypher stent, resulting in reduction of 90% to 10%    CARDIOVASCULAR STRESS TEST  03/22/2007   Mild inferolateral thinning toward the apex without significant ischemia. Nondiagnostic electrocardiogram.   CATARACT EXTRACTION     bilateral   COLONOSCOPY  08/17/2008   adenomatous polyp, diverticulosis, external hemorrhoids   COLONOSCOPY W/ BIOPSIES     multiple    LEFT LOWER EXTREMITY VENOUS DOPPLER  06/18/2011   No evidence of left lower extremity DVT    TRANSTHORACIC ECHOCARDIOGRAM  11/18/2012   EF 40-45%, LV systolic mild-moderately reduced, mild-moderate mitral valve regurg, mild-moderate tricuspid valve regurg. NSR-LBBB with occas PVCs   UPPER GASTROINTESTINAL ENDOSCOPY  07/03/2010   hiatal hernia   VARICOSE VEIN SURGERY     Family History  Problem Relation Age of Onset   Heart disease Father    Stroke Sister    Colon cancer Neg Hx    Social History   Socioeconomic History   Marital status: Married    Spouse name: Chrissie Noa   Number of children: 2   Years of education: Not on file   Highest education level: Not on file  Occupational History   Occupation: retired  Tobacco Use  Smoking status: Never   Smokeless tobacco: Never  Vaping Use   Vaping Use: Never used  Substance and Sexual Activity   Alcohol use: No    Alcohol/week: 0.0 standard drinks of alcohol   Drug use: No   Sexual activity: Not on file  Other Topics Concern   Not on file  Social History Narrative   Married to [place] who has congestive heart failure problems   She is retired never smoker rare alcohol   Social Determinants of Corporate investment banker Strain: Low Risk  (08/22/2022)   Overall Financial Resource Strain (CARDIA)    Difficulty of Paying Living Expenses: Not hard at all  Food Insecurity: No Food Insecurity (08/22/2022)   Hunger Vital Sign    Worried About Running Out of Food in the Last Year: Never true    Ran Out of Food in the Last Year: Never true  Transportation Needs: No Transportation Needs (08/22/2022)   PRAPARE - Administrator, Civil Service (Medical): No    Lack of Transportation (Non-Medical): No  Physical Activity: Insufficiently Active (08/22/2022)   Exercise Vital Sign    Days of Exercise per Week: 4 days    Minutes of Exercise per Session: 30 min  Stress: No Stress Concern Present (08/22/2022)   [employer] of Occupational Health - Occupational Stress Questionnaire    Feeling of Stress : Not at all  Social  Connections: Moderately Isolated (08/22/2022)   Social Connection and Isolation Panel [NHANES]    Frequency of Communication with Friends and Family: More than three times a week    Frequency of Social Gatherings with Friends and Family: Twice a week    Attends Religious Services: Never    Database administrator or Organizations: No    Attends Engineer, structural: Never    Marital Status: Married    Tobacco Counseling Counseling given: No   Clinical Intake:  Pre-visit preparation completed: No  Pain : No/denies pain     Nutritional Status: BMI of 19-24  Normal Nutritional Risks: None Diabetes: No  How often do you need to have someone help you when you read instructions, pamphlets, or other written materials from your doctor or pharmacy?: 1 - Never  Diabetic?No  Interpreter Needed?: No  Information entered by :: Laurel Dimmer, CMA   Activities of Daily Living    08/22/2022   11:35 AM 08/01/2022    2:43 PM  In your present state of health, do you have any difficulty performing the following activities:  Hearing? 0 0  Vision? 0 0  Difficulty concentrating or making decisions? 0 0  Walking or climbing stairs? 0 0  Dressing or bathing? 0 0  Doing errands, shopping? 0 0  Preparing Food and eating ?  N  Using the Toilet?  N  In the past six months, have you accidently leaked urine?  N  Do you have problems with loss of bowel control?  N  Managing your Medications?  N  Managing your Finances?  N  Housekeeping or managing your Housekeeping?  N    Patient Care Team: Etta Grandchild, MD as PCP - General (Internal Medicine) Runell Gess, MD as PCP - Cardiology (Cardiology) Kathyrn Sheriff, [facility] as Pharmacist (Pharmacist) Szabat, Vinnie Level, [facility] (Inactive) (Pharmacist)  Indicate any recent Medical Services you may have received from other than [facility] providers in the past year (date may be approximate).     Assessment:   This is a  routine wellness  examination for [facility].   Hearing/Vision screen Denies any hearing issues. Denies any change to her vision. Wear glasses. Annual Eye Exam.   Dietary issues and exercise activities discussed: Current Exercise Habits: Structured exercise class, Type of exercise: stretching;walking, Time (Minutes): 40, Frequency (Times/Week): 4, Weekly Exercise (Minutes/Week): 160, Intensity: Moderate, Exercise limited by: None identified   Goals Addressed   None    Depression Screen    08/22/2022   11:35 AM 08/01/2022    2:58 PM 07/03/2022   10:23 AM 04/04/2022    3:49 PM 11/08/2021    2:06 PM 07/29/2021   11:10 AM 10/24/2020    2:43 PM  PHQ 2/9 Scores  PHQ - 2 Score 0 0 1 0 0 0 0  PHQ- 9 Score    0 0      Fall Risk    08/22/2022   11:35 AM 08/01/2022    2:40 PM 07/03/2022   10:23 AM 04/04/2022    3:49 PM 11/08/2021    2:06 PM  Fall Risk   Falls in the past year? 0 0 0 0 0  Number falls in past yr: 0 0 0 0 0  Injury with Fall? 0 0 0 0 0  Risk for fall due to : No Fall Risks No Fall Risks No Fall Risks  No Fall Risks  Follow up Falls evaluation completed Falls prevention discussed Falls evaluation completed Falls evaluation completed Falls evaluation completed    FALL RISK PREVENTION PERTAINING TO THE HOME:  Any stairs in or around the home? No  If so, are there any without handrails? No  Home free of loose throw rugs in walkways, pet beds, electrical cords, etc? Yes  Adequate lighting in your home to reduce risk of falls? Yes   ASSISTIVE DEVICES UTILIZED TO PREVENT FALLS:  Life alert? No  Use of a cane, walker or w/c? No  Grab bars in the bathroom? Yes Shower chair or bench in shower? No  Elevated toilet seat or a handicapped toilet? No   TIMED UP AND GO:  Was the test performed? Unable to perform, virtual appointment   Cognitive Function:        08/22/2022   11:38 AM 08/01/2022    2:46 PM 07/29/2021   11:14 AM  6CIT Screen  What Year? 0 points 4 points 0 points  What month? 0 points 0  points 0 points  What time? 0 points 0 points 0 points  Count back from 20 0 points 0 points 0 points  Months in reverse 0 points 2 points 0 points  Repeat phrase 0 points 10 points 0 points  Total Score 0 points 16 points 0 points    Immunizations Immunization History  Administered Date(s) Administered   Fluad Quad(high Dose 65+) 12/31/2018, 01/13/2020, 01/17/2021, 01/03/2022   Influenza Split 02/04/2011, 01/22/2012   Influenza Whole 01/12/2008, 02/08/2009, 02/05/2010   Influenza, High Dose Seasonal PF 01/17/2016, 01/20/2017, 01/20/2018   Influenza,inj,Quad PF,6+ Mos 01/06/2013, 01/20/2014   Influenza-Unspecified 01/08/2015   PFIZER(Purple Top)SARS-COV-2 Vaccination 04/10/2019, 05/01/2019, 07/17/2020   Tdap 10/03/2015, 11/08/2021   Zoster Recombinat (Shingrix) 11/26/2018, 02/15/2019   Zoster, Live 04/16/2012    TDAP status: Up to date  Flu Vaccine status: Up to date  Pneumococcal vaccine status: Due, Education has been provided regarding the importance of this vaccine. Advised may receive this vaccine at local pharmacy or Health Dept. Aware to provide a copy of the vaccination record if obtained from local pharmacy or Health Dept. Verbalized  acceptance and understanding.  Covid-19 vaccine status: Declined, Education has been provided regarding the importance of this vaccine but patient still declined. Advised may receive this vaccine at local pharmacy or Health Dept.or vaccine clinic. Aware to provide a copy of the vaccination record if obtained from local pharmacy or Health Dept. Verbalized acceptance and understanding.  Qualifies for Shingles Vaccine? Yes   Zostavax completed Yes   Shingrix Completed?: Yes  Screening Tests Health Maintenance  Topic Date Due   Pneumonia Vaccine 58+ Years old (1 of 1 - PCV) 08/22/2023 (Originally 01/22/1997)   INFLUENZA VACCINE  10/30/2022   Medicare Annual Wellness (AWV)  08/22/2023   DTaP/Tdap/Td (3 - Td or Tdap) 11/09/2031   DEXA SCAN   Completed   Zoster Vaccines- Shingrix  Completed   HPV VACCINES  Aged Out   COVID-19 Vaccine  Discontinued    Health Maintenance  There are no preventive care reminders to display for this patient.   Colorectal cancer screening: No longer required.   Mammogram status: No longer required due to age.  DEXA Scan: 04/15/2017  Lung Cancer Screening: (Low Dose CT Chest recommended if Age 76-80 years, 30 pack-year currently smoking OR have quit w/in 15years.) does not qualify.   Lung Cancer Screening Referral: not applicable   Additional Screening:  Hepatitis C Screening: does not qualify;   Vision Screening: Recommended annual ophthalmology exams for early detection of glaucoma and other disorders of the eye. Is the patient up to date with their annual eye exam?  Yes  Who is the provider or what is the name of the office in which the patient attends annual eye exams? Dr. Burgess Estelle  If pt is not established with a provider, would they like to be referred to a provider to establish care? No .   Dental Screening: Recommended annual dental exams for proper oral hygiene  Community Resource Referral / Chronic Care Management: CRR required this visit?  No   CCM required this visit?  No      Plan:     I have personally reviewed and noted the following in the patient's chart:   Medical and social history Use of alcohol, tobacco or illicit drugs  Current medications and supplements including opioid prescriptions. Patient is not currently taking opioid prescriptions. Functional ability and status Nutritional status Physical activity Advanced directives List of other physicians Hospitalizations, surgeries, and ER visits in previous 12 months Vitals Screenings to include cognitive, depression, and falls Referrals and appointments  In addition, I have reviewed and discussed with patient certain preventive protocols, quality metrics, and best practice recommendations. A written  personalized care plan for preventive services as well as general preventive health recommendations were provided to patient.   Ms. Bade , Thank you for taking time to come for your Medicare Wellness Visit. I appreciate your ongoing commitment to your health goals. Please review the following plan we discussed and let me know if I can assist you in the future.   These are the goals we discussed:  Goals      Manage My Medicine     Timeframe:  Long-Range Goal Priority:  Medium Start Date:    08/06/20                         Expected End Date:   08/07/22                   Follow Up Date 01/2022   - call  for medicine refill 2 or 3 days before it runs out - call if I am sick and can't take my medicine - keep a list of all the medicines I take; vitamins and herbals too - use a pillbox to sort medicine    Why is this important?   These steps will help you keep on track with your medicines.   Notes:         This is a list of the screening recommended for you and due dates:  Health Maintenance  Topic Date Due   Pneumonia Vaccine (1 of 1 - PCV) 08/22/2023*   Flu Shot  10/30/2022   Medicare Annual Wellness Visit  08/22/2023   DTaP/Tdap/Td vaccine (3 - Td or Tdap) 11/09/2031   DEXA scan (bone density measurement)  Completed   Zoster (Shingles) Vaccine  Completed   HPV Vaccine  Aged Out   COVID-19 Vaccine  Discontinued  *Topic was postponed. The date shown is not the original due date.      Lonna Cobb, CMA   08/22/2022   Nurse Notes: Approximately 30 minute Non-Face -To-Face Medicare Wellness Visit

## 2022-08-22 NOTE — Patient Instructions (Signed)

## 2022-09-04 ENCOUNTER — Other Ambulatory Visit: Payer: Self-pay | Admitting: Internal Medicine

## 2022-09-04 DIAGNOSIS — I2510 Atherosclerotic heart disease of native coronary artery without angina pectoris: Secondary | ICD-10-CM

## 2022-09-04 DIAGNOSIS — E785 Hyperlipidemia, unspecified: Secondary | ICD-10-CM

## 2022-09-08 ENCOUNTER — Other Ambulatory Visit: Payer: Self-pay | Admitting: Internal Medicine

## 2022-09-08 DIAGNOSIS — J301 Allergic rhinitis due to pollen: Secondary | ICD-10-CM

## 2022-09-11 ENCOUNTER — Ambulatory Visit (INDEPENDENT_AMBULATORY_CARE_PROVIDER_SITE_OTHER): Payer: PPO

## 2022-09-11 ENCOUNTER — Ambulatory Visit (INDEPENDENT_AMBULATORY_CARE_PROVIDER_SITE_OTHER): Payer: PPO | Admitting: Podiatry

## 2022-09-11 DIAGNOSIS — M84374A Stress fracture, right foot, initial encounter for fracture: Secondary | ICD-10-CM

## 2022-09-11 DIAGNOSIS — M84374D Stress fracture, right foot, subsequent encounter for fracture with routine healing: Secondary | ICD-10-CM

## 2022-09-11 NOTE — Patient Instructions (Signed)

## 2022-09-13 NOTE — Progress Notes (Signed)
Subjective:   Patient ID: Krystal [name], female   DOB: 89 y.o.   MRN: [id number]   HPI Chief Complaint  Patient presents with   Follow-up    2-3 weeks for stress fracture right foot. Patient is doing a lot better. Denies any pain at this time.     89 year old female presents with above concerns.  She said that she is doing much better she has no pain.  No swelling.  No new concerns or injuries.      Objective:  Physical Exam  General: AAO x3, NAD  Dermatological: Right hallux, second nails are hypertrophic, dystrophic with yellow, brown discoloration.  This is unchanged and stable from last appointment. No edema, erythema or signs of infection.  No open lesions.  Vascular: Dorsalis Pedis artery and Posterior Tibial artery pedal pulses are 2/4 bilateral with immedate capillary fill time.  There is no pain with calf compression, swelling, warmth, erythema.   Neruologic: Grossly intact via light touch bilateral.   Musculoskeletal: Unable to elicit any area of pinpoint tenderness.  There is no pain along the arch of the foot or along the insertion of the plantar fascia today. Flexor and extensor tendons are intact. MMT 5/5.   Gait: Unassisted, Nonantalgic.      Assessment:   89 year old female concern for stress fracture right foot, onychomycosis     Plan:  -Treatment options discussed including all alternatives, risks, and complications -Etiology of symptoms were discussed -X-rays were obtained and reviewed with the patient.  3 views of the foot were repeated today.  Not able to appreciate any evidence of acute fracture or stress fracture today.  Arthritic changes present of the Lisfranc joint.  Vessel calcification present (she has palpable pulses on exam).  -Her pain is resolved at this time.  Continue supportive shoe gear.  Discussed stretching, rehab exercises on a regular basis. -Continue topical medication for nail fungus  Return if symptoms worsen or fail to  improve.  Vivi Barrack DPM

## 2022-09-23 ENCOUNTER — Encounter: Payer: Self-pay | Admitting: Internal Medicine

## 2022-09-23 ENCOUNTER — Ambulatory Visit (INDEPENDENT_AMBULATORY_CARE_PROVIDER_SITE_OTHER): Payer: PPO | Admitting: Internal Medicine

## 2022-09-23 VITALS — BP 140/100 | HR 63 | Temp 98.5°F | Ht 65.0 in | Wt 151.0 lb

## 2022-09-23 DIAGNOSIS — J0110 Acute frontal sinusitis, unspecified: Secondary | ICD-10-CM

## 2022-09-23 MED ORDER — AZITHROMYCIN 250 MG PO TABS: ORAL_TABLET | ORAL | 0 refills | Status: AC

## 2022-09-23 NOTE — Progress Notes (Signed)
   Subjective:   Patient ID: Krystal [name], female    DOB: 02-11-1930, 89 y.o.   MRN: [id number]  HPI The patient is a 89 YO female coming in for sinus issues. Going on 2 weeks and worsening. Using flonase and zyrtec regularly. Headaches, running nose, sore throat, cough. No SOB  Review of Systems  Constitutional:  Positive for activity change and appetite change. Negative for chills, fatigue, fever and unexpected weight change.  HENT:  Positive for congestion, postnasal drip, rhinorrhea, sinus pressure and sinus pain. Negative for ear discharge, ear pain, sneezing, sore throat, tinnitus, trouble swallowing and voice change.   Eyes: Negative.   Respiratory:  Negative for cough, chest tightness, shortness of breath and wheezing.   Cardiovascular: Negative.   Gastrointestinal: Negative.   Musculoskeletal:  Positive for myalgias.  Neurological:  Positive for headaches.    Objective:  Physical Exam Constitutional:      Appearance: She is well-developed.  HENT:     Head: Normocephalic and atraumatic.     Comments: Oropharynx with redness and clear drainage, nose with swollen turbinates, TMs normal bilaterally. Sinus pain frontal Neck:     Thyroid: No thyromegaly.  Cardiovascular:     Rate and Rhythm: Normal rate and regular rhythm.  Pulmonary:     Effort: Pulmonary effort is normal. No respiratory distress.     Breath sounds: Normal breath sounds. No wheezing or rales.  Abdominal:     Palpations: Abdomen is soft.  Musculoskeletal:        General: Tenderness present.     Cervical back: Normal range of motion.  Lymphadenopathy:     Cervical: No cervical adenopathy.  Skin:    General: Skin is warm and dry.  Neurological:     Mental Status: She is alert and oriented to person, place, and time.     Vitals:   09/23/22 1539 09/23/22 1541  BP: (!) 140/100 (!) 140/100  Pulse: 63   Temp: 98.5 F (36.9 C)   TempSrc: Oral   SpO2: 93%   Weight: 151 lb (68.5 kg)   Height: 5\' 5"   (1.651 m)     Assessment & Plan:

## 2022-09-23 NOTE — Patient Instructions (Signed)
We have sent in azithromycin to take 2 pills today, then 1 pill daily until gone.

## 2022-09-23 NOTE — Assessment & Plan Note (Signed)
Rx azithromycin due to other allergies 7 day course. Advised to continue zyrtec and flonase.

## 2022-10-13 DIAGNOSIS — H2000 Unspecified acute and subacute iridocyclitis: Secondary | ICD-10-CM | POA: Diagnosis not present

## 2022-10-13 DIAGNOSIS — H5712 Ocular pain, left eye: Secondary | ICD-10-CM | POA: Diagnosis not present

## 2022-10-20 DIAGNOSIS — H2000 Unspecified acute and subacute iridocyclitis: Secondary | ICD-10-CM | POA: Diagnosis not present

## 2022-11-06 ENCOUNTER — Other Ambulatory Visit: Payer: Self-pay | Admitting: Internal Medicine

## 2022-11-06 ENCOUNTER — Telehealth: Payer: Self-pay | Admitting: Internal Medicine

## 2022-11-06 DIAGNOSIS — M5030 Other cervical disc degeneration, unspecified cervical region: Secondary | ICD-10-CM

## 2022-11-06 MED ORDER — GABAPENTIN 100 MG PO CAPS: 100.0000 mg | ORAL_CAPSULE | Freq: Every day | ORAL | 0 refills | Status: DC

## 2022-11-06 NOTE — Telephone Encounter (Signed)
Prescription Request  11/06/2022  LOV: 07/03/2022  What is the name of the medication or equipment? Gabapentin - patient is requesting a 90 day refill   Have you contacted your pharmacy to request a refill? Yes   Which pharmacy would you like this sent to?  [id number] [place], [place] - [address] [place] [address] Phone: [phone] Fax: [phone]     Patient notified that their request is being sent to the clinical staff for review and that they should receive a response within 2 business days.   Please advise at Mobile [phone] (mobile)

## 2022-11-17 ENCOUNTER — Emergency Department (HOSPITAL_COMMUNITY): Payer: PPO

## 2022-11-17 ENCOUNTER — Encounter (HOSPITAL_COMMUNITY): Payer: Self-pay

## 2022-11-17 ENCOUNTER — Other Ambulatory Visit: Payer: Self-pay

## 2022-11-17 ENCOUNTER — Emergency Department (HOSPITAL_COMMUNITY)
Admission: EM | Admit: 2022-11-17 | Discharge: 2022-11-17 | Disposition: A | Discharge status: Home / Self Care | Payer: PPO | Attending: Emergency Medicine | Admitting: Emergency Medicine

## 2022-11-17 DIAGNOSIS — H04123 Dry eye syndrome of bilateral lacrimal glands: Secondary | ICD-10-CM | POA: Diagnosis not present

## 2022-11-17 DIAGNOSIS — I2510 Atherosclerotic heart disease of native coronary artery without angina pectoris: Secondary | ICD-10-CM | POA: Insufficient documentation

## 2022-11-17 DIAGNOSIS — I10 Essential (primary) hypertension: Secondary | ICD-10-CM | POA: Diagnosis not present

## 2022-11-17 DIAGNOSIS — N189 Chronic kidney disease, unspecified: Secondary | ICD-10-CM | POA: Diagnosis not present

## 2022-11-17 DIAGNOSIS — Z7982 Long term (current) use of aspirin: Secondary | ICD-10-CM | POA: Insufficient documentation

## 2022-11-17 DIAGNOSIS — I6782 Cerebral ischemia: Secondary | ICD-10-CM | POA: Diagnosis not present

## 2022-11-17 DIAGNOSIS — Z79899 Other long term (current) drug therapy: Secondary | ICD-10-CM | POA: Diagnosis not present

## 2022-11-17 DIAGNOSIS — R519 Headache, unspecified: Secondary | ICD-10-CM | POA: Diagnosis not present

## 2022-11-17 DIAGNOSIS — I129 Hypertensive chronic kidney disease with stage 1 through stage 4 chronic kidney disease, or unspecified chronic kidney disease: Secondary | ICD-10-CM | POA: Diagnosis not present

## 2022-11-17 DIAGNOSIS — I420 Dilated cardiomyopathy: Secondary | ICD-10-CM

## 2022-11-17 LAB — URINALYSIS, ROUTINE W REFLEX MICROSCOPIC
Bilirubin Urine: NEGATIVE
Glucose, UA: NEGATIVE mg/dL
Hgb urine dipstick: NEGATIVE
Ketones, ur: NEGATIVE mg/dL
Leukocytes,Ua: NEGATIVE
Nitrite: NEGATIVE
Protein, ur: NEGATIVE mg/dL
Specific Gravity, Urine: 1.008 (ref 1.005–1.030)
pH: 7 (ref 5.0–8.0)

## 2022-11-17 LAB — CBC
HCT: 41.9 % (ref 36.0–46.0)
Hemoglobin: 13.4 g/dL (ref 12.0–15.0)
MCH: 30.2 pg (ref 26.0–34.0)
MCHC: 32 g/dL (ref 30.0–36.0)
MCV: 94.6 fL (ref 80.0–100.0)
Platelets: 201 10*3/uL (ref 150–400)
RBC: 4.43 MIL/uL (ref 3.87–5.11)
RDW: 13.9 % (ref 11.5–15.5)
WBC: 6.4 10*3/uL (ref 4.0–10.5)
nRBC: 0 % (ref 0.0–0.2)

## 2022-11-17 LAB — BASIC METABOLIC PANEL
Anion gap: 8 (ref 5–15)
BUN: 17 mg/dL (ref 8–23)
CO2: 23 mmol/L (ref 22–32)
Calcium: 8.6 mg/dL — ABNORMAL LOW (ref 8.9–10.3)
Chloride: 105 mmol/L (ref 98–111)
Creatinine, Ser: 0.93 mg/dL (ref 0.44–1.00)
GFR, Estimated: 58 mL/min — ABNORMAL LOW (ref >60)
Glucose, Bld: 106 mg/dL — ABNORMAL HIGH (ref 70–99)
Potassium: 4.5 mmol/L (ref 3.5–5.1)
Sodium: 136 mmol/L (ref 135–145)

## 2022-11-17 LAB — TROPONIN I (HIGH SENSITIVITY): Troponin I (High Sensitivity): 9 ng/L (ref <18)

## 2022-11-17 MED ORDER — HYDRALAZINE HCL 50 MG PO TABS
50.0000 mg | ORAL_TABLET | Freq: Three times a day (TID) | ORAL | 0 refills | Status: DC
Start: 2022-11-17 — End: 2023-01-14

## 2022-11-17 MED ORDER — METOPROLOL TARTRATE 5 MG/5ML IV SOLN
5.0000 mg | Freq: Once | INTRAVENOUS | Status: AC
  Administered 2022-11-17: 5 mg via INTRAVENOUS
  Filled 2022-11-17: qty 5

## 2022-11-17 NOTE — ED Triage Notes (Signed)
Patient said she checked her blood pressure and it was 198/96. Complaining of a left sided headache that began yesterday morning. Denies changes in vision or chest pain. Feels nauseous. Took her losartan BP medication at 630am.

## 2022-11-17 NOTE — Discharge Instructions (Signed)
Increase your hydralazine to 50 mg 3 times a day.

## 2022-11-17 NOTE — ED Notes (Signed)
Patient transported to CT 

## 2022-11-17 NOTE — ED Provider Notes (Signed)
[place] EMERGENCY DEPARTMENT AT [facility] Provider Note   CSN: [id number] Arrival date & time: 11/17/22  2440     History  Chief Complaint  Patient presents with   Hypertension    Krystal [name] is a 89 y.o. female.  Pt is a 89 yo female with pmhx significant for htn, anxiety, cad, hld, gerd, and ckd.  Pt said she has had problems with her bp elevated for the past few days.  Pt said she has been compliant with her losartan, toprol xl, and hydralazine.  Pt said she was recently treated with prednisone drops for an "infection in her left eye."  She said her eye doctor told her it may not completely treat.  She took all the drops.  She has been having a headache and was concerned she was having a stroke this am.  She denies any lvo sx.       Home Medications Prior to Admission medications   Medication Sig Start Date End Date Taking? Authorizing Provider  acetaminophen (TYLENOL) 500 MG tablet Take 500 mg by mouth every 4 (four) hours as needed for mild pain or headache.    [provider]  albuterol (VENTOLIN HFA) 108 (90 Base) MCG/ACT inhaler TAKE 2 PUFFS BY MOUTH EVERY 6 HOURS AS NEEDED FOR WHEEZE OR SHORTNESS OF BREATH Patient taking differently: Inhale 2 puffs into the lungs every 6 (six) hours as needed for shortness of breath or wheezing. 09/12/21   Etta Grandchild, MD  ALPRAZolam Prudy Feeler) 0.5 MG tablet Take 1 tablet (0.5 mg total) by mouth at bedtime. 07/03/22   Etta Grandchild, MD  aspirin EC 81 MG tablet Take 81 mg by mouth daily.    [provider]  Calcium Carbonate Antacid (CALCIUM CARBONATE PO) Take 1 tablet by mouth daily.    [provider]  Cholecalciferol (VITAMIN D-3 PO) Take 1 capsule by mouth daily.    [provider]  ciclopirox (PENLAC) 8 % solution Apply topically at bedtime. Apply over nail and surrounding skin. Apply daily over previous coat. After seven (7) days, may remove with alcohol and continue cycle. 08/22/22    Vivi Barrack, DPM  cycloSPORINE (RESTASIS) 0.05 % ophthalmic emulsion Place 1 drop into both eyes 2 (two) times daily.    [provider]  fluticasone (FLONASE) 50 MCG/ACT nasal spray Place 1 spray into both nostrils daily. 09/08/22   Etta Grandchild, MD  gabapentin (NEURONTIN) 100 MG capsule Take 1 capsule (100 mg total) by mouth at bedtime. 11/06/22   Etta Grandchild, MD  hydrALAZINE (APRESOLINE) 50 MG tablet Take 1 tablet (50 mg total) by mouth 3 (three) times daily. 11/17/22   Jacalyn Lefevre, MD  lansoprazole (PREVACID) 15 MG capsule Take 1 capsule (15 mg total) by mouth 2 (two) times daily before a meal. 03/05/22   Corwin Levins, MD  loratadine (CLARITIN) 10 MG tablet TAKE 1 TABLET BY MOUTH EVERY DAY Patient taking differently: Take 10 mg by mouth daily. 08/05/21   Etta Grandchild, MD  losartan (COZAAR) 50 MG tablet TAKE 1 TABLET BY MOUTH TWICE A DAY 08/06/22   Etta Grandchild, MD  metoprolol succinate (TOPROL-XL) 50 MG 24 hr tablet Take 1 tablet (50 mg total) by mouth daily. Take with or immediately following a meal. 02/05/22   Corwin Levins, MD  ondansetron (ZOFRAN-ODT) 4 MG disintegrating tablet TAKE 1 TABLET BY MOUTH EVERY 8 HOURS AS NEEDED FOR NAUSEA AND VOMITING 05/04/22  Corwin Levins, MD  PREMARIN vaginal cream Place 1 Application vaginally See admin instructions. Apply vaginally 2-3 times a week as directed 12/20/18   [provider]  rosuvastatin (CRESTOR) 10 MG tablet TAKE 1 TABLET BY MOUTH EVERY DAY 09/04/22   Etta Grandchild, MD  Tavaborole (KERYDIN) 5 % SOLN Apply 1 drop topically daily. Apply 1 drop to the toenail daily. 08/18/22   Vivi Barrack, DPM      Allergies    Lipitor [atorvastatin], Zocor [simvastatin], Doxycycline, Fosamax [alendronate], Levaquin [levofloxacin], Nexium [esomeprazole], Omnicef [cefdinir], Penicillins, Pneumococcal vaccine, Pneumovax [pneumococcal polysaccharide vaccine], Prilosec [omeprazole], Norvasc [amlodipine], Shellfish allergy, and  Shellfish-derived products    Review of Systems   Review of Systems  Neurological:  Positive for headaches.  All other systems reviewed and are negative.   Physical Exam Updated Vital Signs BP (!) 168/87   Pulse (!) 57   Temp 97.8 F (36.6 C) (Oral)   Resp 15   Ht 5\' 5"  (1.651 m)   Wt 67.6 kg   SpO2 99%   BMI 24.79 kg/m  Physical Exam Vitals and nursing note reviewed.  Constitutional:      Appearance: Normal appearance.  HENT:     Head: Normocephalic and atraumatic.     Right Ear: External ear normal.     Left Ear: External ear normal.     Nose: Nose normal.     Mouth/Throat:     Mouth: Mucous membranes are moist.     Pharynx: Oropharynx is clear.  Eyes:     Extraocular Movements: Extraocular movements intact.     Conjunctiva/sclera: Conjunctivae normal.     Pupils: Pupils are equal, round, and reactive to light.     Comments: Left globe feels soft.  No irritation to the conjunctiva.  Cardiovascular:     Rate and Rhythm: Normal rate and regular rhythm.     Pulses: Normal pulses.     Heart sounds: Normal heart sounds.  Pulmonary:     Effort: Pulmonary effort is normal.     Breath sounds: Normal breath sounds.  Abdominal:     General: Abdomen is flat. Bowel sounds are normal.     Palpations: Abdomen is soft.  Musculoskeletal:        General: Normal range of motion.     Cervical back: Normal range of motion and neck supple.  Skin:    General: Skin is warm.     Capillary Refill: Capillary refill takes less than 2 seconds.  Neurological:     General: No focal deficit present.     Mental Status: She is alert and oriented to person, place, and time.  Psychiatric:        Mood and Affect: Mood normal.        Behavior: Behavior normal.     ED Results / Procedures / Treatments   Labs (all labs ordered are listed, but only abnormal results are displayed) Labs Reviewed  BASIC METABOLIC PANEL - Abnormal; Notable for the following components:      Result Value    Glucose, Bld 106 (*)    Calcium 8.6 (*)    GFR, Estimated 58 (*)    All other components within normal limits  URINALYSIS, ROUTINE W REFLEX MICROSCOPIC - Abnormal; Notable for the following components:   APPearance HAZY (*)    All other components within normal limits  CBC  TROPONIN I (HIGH SENSITIVITY)    EKG EKG Interpretation Date/Time:  Monday November 17 2022 08:46:12 EDT  Ventricular Rate:  60 PR Interval:  207 QRS Duration:  160 QT Interval:  491 QTC Calculation: 491 R Axis:   28  Text Interpretation: Sinus rhythm Left bundle branch block No significant change since last tracing Confirmed by Jacalyn Lefevre [phone]) on 11/17/2022 9:20:10 AM  Radiology CT Head Wo Contrast  Result Date: 11/17/2022 CLINICAL DATA:  Provided history: Headache, new onset. Additional history provided: Recent left eye infection. Headache. Nausea. EXAM: CT HEAD WITHOUT CONTRAST TECHNIQUE: Contiguous axial images were obtained from the base of the skull through the vertex without intravenous contrast. RADIATION DOSE REDUCTION: This exam was performed according to the departmental dose-optimization program which includes automated exposure control, adjustment of the mA and/or kV according to patient size and/or use of iterative reconstruction technique. COMPARISON:  None. FINDINGS: Brain: No age advanced or lobar predominant parenchymal atrophy. Patchy and ill-defined hypoattenuation within the cerebral white matter, nonspecific but compatible with mild chronic small vessel ischemic disease. 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 There is no acute intracranial hemorrhage. No demarcated cortical infarct. No extra-axial fluid collection. No evidence of an intracranial mass. No midline shift. Vascular: No hyperdense vessel.  Atherosclerotic calcifications. Skull: No calvarial fracture or aggressive osseous lesion. Sinuses/Orbits: No mass or acute finding within the imaged orbits. No significant  paranasal sinus disease at the imaged levels. IMPRESSION: 1. No evidence of an acute intracranial abnormality on this non-contrast exam. 2. Mild chronic small vessel ischemic changes within the cerebral white matter. 3. 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 Electronically Signed   By: Jackey Loge D.O.   On: 11/17/2022 10:57   DG Chest 2 View  Result Date: 11/17/2022 CLINICAL DATA:  Hypertension. Headache. Denies vision or chest pain EXAM: CHEST - 2 VIEW COMPARISON:  CXR 04/08/22 FINDINGS: No pleural effusion. No pneumothorax. No focal airspace opacity. Normal cardiac and mediastinal contours. No radiographically apparent displaced rib fractures. Visualized upper abdomen is unremarkable. Vertebral body heights are unchanged compared to prior exam. IMPRESSION: No focal airspace opacity Electronically Signed   By: Lorenza Cambridge M.D.   On: 11/17/2022 10:00    Procedures Procedures    Medications Ordered in ED Medications  metoprolol tartrate (LOPRESSOR) injection 5 mg (5 mg Intravenous Given 11/17/22 0902)    ED Course/ Medical Decision Making/ A&P                                 Medical Decision Making Amount and/or Complexity of Data Reviewed Labs: ordered. Radiology: ordered.  Risk Prescription drug management.   This patient presents to the ED for concern of headache and htn, this involves an extensive number of treatment options, and is a complaint that carries with it a high risk of complications and morbidity.  The differential diagnosis includes htn, ich, electrolyte abn, eye problem   Co morbidities that complicate the patient evaluation  htn, anxiety, cad, hld, gerd, and ckd   Additional history obtained:  Additional history obtained from epic chart review   Lab Tests:  I Ordered, and personally interpreted labs.  The pertinent results include:  cbc nl, bmp nl, trop nl, ua nl   Imaging Studies ordered:  I ordered imaging studies including  cxr, ct head  I independently visualized and interpreted imaging which showed  CXR: No focal airspace opacity  CT head:  No evidence of an acute intracranial abnormality on this  non-contrast exam.  2. Mild chronic small  vessel ischemic changes within the cerebral  white matter.  3. 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   I agree with the radiologist interpretation   Cardiac Monitoring:  The patient was maintained on a cardiac monitor.  I personally viewed and interpreted the cardiac monitored which showed an underlying rhythm of: nsr   Medicines ordered and prescription drug management:  I ordered medication including lopressor  for hypertension  Reevaluation of the patient after these medicines showed that the patient improved I have reviewed the patients home medicines and have made adjustments as needed   Test Considered:  ct   Critical Interventions:  lopressor  Problem List / ED Course:  HTN:  bp has come down to 173 after 5 mg lopressor iv.  HR down to the upper 50s, so I don't think we can increase her home toprol. Cozaar dose is maxed out.  I am going to increase her hydralazine dose to 50 mg tid.  Dash diet given. Left eye pain:  no pain now.  Pt needs to f/u with her ophthalmologist.     Reevaluation:  After the interventions noted above, I reevaluated the patient and found that they have :improved   Social Determinants of Health:  Lives at home   Dispostion:  After consideration of the diagnostic results and the patients response to treatment, I feel that the patent would benefit from discharge with outpatient f/u.          Final Clinical Impression(s) / ED Diagnoses Final diagnoses:  Hypertension, unspecified type    Rx / DC Orders ED Discharge Orders          Ordered    hydrALAZINE (APRESOLINE) 50 MG tablet  3 times daily        11/17/22 1117              Jacalyn Lefevre, MD 11/17/22 1118

## 2022-11-25 ENCOUNTER — Telehealth: Payer: Self-pay

## 2022-11-25 ENCOUNTER — Encounter: Payer: Self-pay | Admitting: Family Medicine

## 2022-11-25 ENCOUNTER — Telehealth: Payer: Self-pay | Admitting: Family Medicine

## 2022-11-25 ENCOUNTER — Ambulatory Visit (INDEPENDENT_AMBULATORY_CARE_PROVIDER_SITE_OTHER): Payer: PPO | Admitting: Family Medicine

## 2022-11-25 VITALS — BP 136/92 | HR 63 | Temp 97.6°F | Ht 65.0 in | Wt 148.0 lb

## 2022-11-25 DIAGNOSIS — I10 Essential (primary) hypertension: Secondary | ICD-10-CM

## 2022-11-25 DIAGNOSIS — R0989 Other specified symptoms and signs involving the circulatory and respiratory systems: Secondary | ICD-10-CM

## 2022-11-25 NOTE — Telephone Encounter (Signed)
Transition Care Management Follow-up Telephone Call Date of discharge and from where: 11/17/2022 [facility] How have you been since you were released from the hospital? Patient's daughter stated she is feeling better but her BP dipped over the weekend it is back to normal. Any questions or concerns? No  Items Reviewed: Did the pt receive and understand the discharge instructions provided? Yes  Medications obtained and verified? Yes  Other?  Emailed caregiver resources to patient's daughters per request. Any new allergies since your discharge? No  Dietary orders reviewed? Yes Do you have support at home? Yes   Follow up appointments reviewed:  PCP Hospital f/u appt confirmed? Yes  Scheduled to see Vickie L. Suezanne Jacquet, NP-C on 11/25/2022 @ [facility] at [place]. Specialist Hospital f/u appt confirmed? Yes  Scheduled to see Runell Gess, MD on 02/04/2023 @ [facility]. Are transportation arrangements needed? No  If their condition worsens, is the pt aware to call PCP or go to the Emergency Dept.? Yes Was the patient provided with contact information for the PCP's office or ED? Yes Was to pt encouraged to call back with questions or concerns? Yes  Keeleigh Terris [facility]  [facility] Community Resource Care Guide   [email or web address]  ?? [phone]   Website: [email or web address]  [email or web address]

## 2022-11-25 NOTE — Patient Instructions (Signed)
Continue your current medication regimen.   Monitor blood pressure and pulse and keep a record.   I have referred you to the hypertension clinic and they will call you to schedule a visit.   Keep your upcoming appointment with Dr. Allyson Sabal and follow up with Dr. Yetta Barre as recommended.

## 2022-11-25 NOTE — Progress Notes (Signed)
Subjective:     Patient ID: Krystal [name], female    DOB: 1930-02-07, 89 y.o.   MRN: [id number]  Chief Complaint  Patient presents with   Hypertension    Hospitalized 8/19 with BP in 200s. They prescribed new medications and BP dropped too low so they have modified medications.   Has BP logs w her, waking up with BP 150, after taking medications goes down to 140-130    HPI  Discussed the use of AI scribe software for clinical note transcription with the patient, who gave verbal consent to proceed.  History of Present Illness         She is here with her daughter with HTN concerns.  Recent ED visit for elevated HTN.  She had a cardiac work up which was negative and a CT head - negative for acute findings.   Daughter reports patient's medications were increased in the ED. States she has since cut her back due to low BP a few days later. Reports taking hydralazine to 50 mg tid, Toprol 50 mg in the morning and losartan 50 mg nightly (increased to bid in the ED).   Currently feels at baseline.   Health Maintenance Due  Topic Date Due   INFLUENZA VACCINE  10/30/2022    Past Medical History:  Diagnosis Date   Adenomatous colon polyp    Allergy    seasonal   Anemia    pt. denies   Anxiety    CAD (coronary artery disease)    Cataract    cataracts removed left eye.   Chronic lower back pain    Diverticulosis    GERD (gastroesophageal reflux disease)    Headache(784.0)    Hiatal hernia    Hyperlipidemia    Hypertension    IBS (irritable bowel syndrome) 10/13/2011   Osteoarthritis    Osteoporosis    Renal cysts, acquired, bilateral    SBO (small bowel obstruction) (HCC) 09/05/2015   Venous insufficiency     Past Surgical History:  Procedure Laterality Date   ABDOMINAL HYSTERECTOMY     BLADDER REPAIR     CARDIAC CATHETERIZATION  01/12/2002   90% stenosis of the left circumflex, stented with a 3x27mm Cypher stent, resulting in reduction of 90% to 10%     CARDIOVASCULAR STRESS TEST  03/22/2007   Mild inferolateral thinning toward the apex without significant ischemia. Nondiagnostic electrocardiogram.   CATARACT EXTRACTION     bilateral   COLONOSCOPY  08/17/2008   adenomatous polyp, diverticulosis, external hemorrhoids   COLONOSCOPY W/ BIOPSIES     multiple    LEFT LOWER EXTREMITY VENOUS DOPPLER  06/18/2011   No evidence of left lower extremity DVT   TRANSTHORACIC ECHOCARDIOGRAM  11/18/2012   EF 40-45%, LV systolic mild-moderately reduced, mild-moderate mitral valve regurg, mild-moderate tricuspid valve regurg. NSR-LBBB with occas PVCs   UPPER GASTROINTESTINAL ENDOSCOPY  07/03/2010   hiatal hernia   VARICOSE VEIN SURGERY      Family History  Problem Relation Age of Onset   Heart disease Father    Stroke Sister    Colon cancer Neg Hx     Social History   Socioeconomic History   Marital status: Married    Spouse name: Chrissie Noa   Number of children: 2   Years of education: Not on file   Highest education level: Not on file  Occupational History   Occupation: retired  Tobacco Use   Smoking status: Never   Smokeless tobacco: Never  Advertising account planner  Vaping status: Never Used  Substance and Sexual Activity   Alcohol use: No    Alcohol/week: 0.0 standard drinks of alcohol   Drug use: No   Sexual activity: Not on file  Other Topics Concern   Not on file  Social History Narrative   Married to [place] who has congestive heart failure problems   She is retired never smoker rare alcohol   Social Determinants of Corporate investment banker Strain: Low Risk  (08/22/2022)   Overall Financial Resource Strain (CARDIA)    Difficulty of Paying Living Expenses: Not hard at all  Food Insecurity: No Food Insecurity (08/22/2022)   Hunger Vital Sign    Worried About Running Out of Food in the Last Year: Never true    Ran Out of Food in the Last Year: Never true  Transportation Needs: No Transportation Needs (08/22/2022)   PRAPARE - Therapist, art (Medical): No    Lack of Transportation (Non-Medical): No  Physical Activity: Insufficiently Active (08/22/2022)   Exercise Vital Sign    Days of Exercise per Week: 4 days    Minutes of Exercise per Session: 30 min  Stress: No Stress Concern Present (08/22/2022)   [employer] of Occupational Health - Occupational Stress Questionnaire    Feeling of Stress : Not at all  Social Connections: Moderately Isolated (08/22/2022)   Social Connection and Isolation Panel [NHANES]    Frequency of Communication with Friends and Family: More than three times a week    Frequency of Social Gatherings with Friends and Family: Twice a week    Attends Religious Services: Never    Database administrator or Organizations: No    Attends Banker Meetings: Never    Marital Status: Married  Catering manager Violence: Not At Risk (08/22/2022)   Humiliation, Afraid, Rape, and Kick questionnaire    Fear of Current or Ex-Partner: No    Emotionally Abused: No    Physically Abused: No    Sexually Abused: No    Outpatient Medications Prior to Visit  Medication Sig Dispense Refill   acetaminophen (TYLENOL) 500 MG tablet Take 500 mg by mouth every 4 (four) hours as needed for mild pain or headache.     albuterol (VENTOLIN HFA) 108 (90 Base) MCG/ACT inhaler TAKE 2 PUFFS BY MOUTH EVERY 6 HOURS AS NEEDED FOR WHEEZE OR SHORTNESS OF BREATH (Patient taking differently: Inhale 2 puffs into the lungs every 6 (six) hours as needed for shortness of breath or wheezing.) 8.5 each 1   ALPRAZolam (XANAX) 0.5 MG tablet Take 1 tablet (0.5 mg total) by mouth at bedtime. 90 tablet 1   aspirin EC 81 MG tablet Take 81 mg by mouth daily.     Calcium Carbonate Antacid (CALCIUM CARBONATE PO) Take 1 tablet by mouth daily.     Cholecalciferol (VITAMIN D-3 PO) Take 1 capsule by mouth daily.     ciclopirox (PENLAC) 8 % solution Apply topically at bedtime. Apply over nail and surrounding skin. Apply daily  over previous coat. After seven (7) days, may remove with alcohol and continue cycle. 6.6 mL 2   cycloSPORINE (RESTASIS) 0.05 % ophthalmic emulsion Place 1 drop into both eyes 2 (two) times daily.     fluticasone (FLONASE) 50 MCG/ACT nasal spray Place 1 spray into both nostrils daily. 48 mL 1   hydrALAZINE (APRESOLINE) 50 MG tablet Take 1 tablet (50 mg total) by mouth 3 (three) times daily. 90 tablet 0  lansoprazole (PREVACID) 15 MG capsule Take 1 capsule (15 mg total) by mouth 2 (two) times daily before a meal. 180 capsule 3   losartan (COZAAR) 50 MG tablet TAKE 1 TABLET BY MOUTH TWICE A DAY 180 tablet 1   metoprolol succinate (TOPROL-XL) 50 MG 24 hr tablet Take 1 tablet (50 mg total) by mouth daily. Take with or immediately following a meal. 90 tablet 3   ondansetron (ZOFRAN-ODT) 4 MG disintegrating tablet TAKE 1 TABLET BY MOUTH EVERY 8 HOURS AS NEEDED FOR NAUSEA AND VOMITING 18 tablet 1   PREMARIN vaginal cream Place 1 Application vaginally See admin instructions. Apply vaginally 2-3 times a week as directed     rosuvastatin (CRESTOR) 10 MG tablet TAKE 1 TABLET BY MOUTH EVERY DAY 90 tablet 1   Tavaborole (KERYDIN) 5 % SOLN Apply 1 drop topically daily. Apply 1 drop to the toenail daily. 10 mL 2   gabapentin (NEURONTIN) 100 MG capsule Take 1 capsule (100 mg total) by mouth at bedtime. (Patient not taking: Reported on 11/25/2022) 90 capsule 0   loratadine (CLARITIN) 10 MG tablet TAKE 1 TABLET BY MOUTH EVERY DAY (Patient not taking: Reported on 11/25/2022) 90 tablet 3   No facility-administered medications prior to visit.    Allergies  Allergen Reactions   Lipitor [Atorvastatin] Other (See Comments)    Myalgias    Zocor [Simvastatin] Other (See Comments)    Myalgias    Doxycycline     diarrhea   Fosamax [Alendronate] Other (See Comments)    Esophagitis   Levaquin [Levofloxacin] Diarrhea   Nexium [Esomeprazole] Nausea Only and Other (See Comments)    Stomach pains/burning sensation    Omnicef [Cefdinir] Diarrhea   Penicillins Hives   Pneumococcal Vaccine Other (See Comments)    Fatigue and aches that lasted 6+ months   Pneumovax [Pneumococcal Polysaccharide Vaccine]    Prilosec [Omeprazole] Nausea Only and Other (See Comments)    Stomach pains/burning sensation   Norvasc [Amlodipine] Swelling    Ankle edema   Shellfish Allergy Hives, Nausea Only and Rash   Shellfish-Derived Products Hives, Nausea Only and Rash    Review of Systems  Constitutional:  Negative for chills and fever.  Eyes:  Negative for blurred vision and double vision.  Respiratory:  Negative for cough and shortness of breath.   Cardiovascular:  Negative for chest pain, palpitations and leg swelling.  Gastrointestinal:  Negative for abdominal pain, constipation, diarrhea, nausea and vomiting.  Genitourinary:  Negative for dysuria, frequency and urgency.  Musculoskeletal:  Negative for falls.  Neurological:  Negative for dizziness and focal weakness.       Objective:    Physical Exam Constitutional:      General: She is not in acute distress.    Appearance: She is not ill-appearing.  HENT:     Mouth/Throat:     Mouth: Mucous membranes are moist.     Pharynx: Oropharynx is clear.  Eyes:     Extraocular Movements: Extraocular movements intact.     Conjunctiva/sclera: Conjunctivae normal.  Cardiovascular:     Rate and Rhythm: Normal rate and regular rhythm.  Pulmonary:     Effort: Pulmonary effort is normal.     Breath sounds: Normal breath sounds.  Musculoskeletal:     Cervical back: Normal range of motion and neck supple.     Right lower leg: No edema.     Left lower leg: No edema.  Skin:    General: Skin is warm and dry.  Neurological:  General: No focal deficit present.     Mental Status: She is alert and oriented to person, place, and time.     Cranial Nerves: No cranial nerve deficit.     Motor: No weakness.     Coordination: Coordination normal.  Psychiatric:        Mood  and Affect: Mood normal.        Behavior: Behavior normal.     BP (!) 136/92 (BP Location: Left Arm, Patient Position: Sitting, Cuff Size: Normal)   Pulse 63   Temp 97.6 F (36.4 C) (Temporal)   Ht 5\' 5"  (1.651 m)   Wt 148 lb (67.1 kg)   SpO2 98%   BMI 24.63 kg/m  Wt Readings from Last 3 Encounters:  11/25/22 148 lb (67.1 kg)  11/17/22 149 lb (67.6 kg)  09/23/22 151 lb (68.5 kg)       Assessment & Plan:   Problem List Items Addressed This Visit       Cardiovascular and Mediastinum   Essential hypertension   Other Visit Diagnoses     Labile hypertension    -  Primary   Relevant Orders   Ambulatory referral to [facility]      Reviewed notes and results from ED visit regarding HTN.  Reports being in her usual state of health for the past 2-3 days. Daughter reports reducing HTN medication prescribed by ED.  Recommend continue with current medication regimen and closely monitor BP and pulse. Referral to HTN clinic.  Scheduled to see cardiologist in October. Follow up with PCP.   I am having Allyssia L. Leng maintain her acetaminophen, Premarin, aspirin EC, loratadine, albuterol, cycloSPORINE, Cholecalciferol (VITAMIN D-3 PO), Calcium Carbonate Antacid (CALCIUM CARBONATE PO), metoprolol succinate, lansoprazole, ondansetron, ALPRAZolam, losartan, Tavaborole, ciclopirox, rosuvastatin, fluticasone, gabapentin, and hydrALAZINE.  No orders of the defined types were placed in this encounter.

## 2022-11-25 NOTE — Telephone Encounter (Signed)
Daughter called - Leroy Kennedy - [phone] - Daughter called and said that she would like to share some information about her mom that we may not be aware of.  Please call her at the listed number.

## 2022-11-25 NOTE — Telephone Encounter (Signed)
Transition Care Management Unsuccessful Follow-up Telephone Call  Date of discharge and from where:  11/17/2022 [facility]  Attempts:  1st Attempt  Reason for unsuccessful TCM follow-up call:  No answer/busy  Cecila Satcher [facility]  [facility] Population Health Community Resource Care Guide   [email or web address]  ?? [phone]   Website: [email or web address]  [email or web address]

## 2022-12-13 ENCOUNTER — Other Ambulatory Visit: Payer: Self-pay | Admitting: Internal Medicine

## 2022-12-30 ENCOUNTER — Other Ambulatory Visit: Payer: Self-pay | Admitting: Internal Medicine

## 2022-12-30 DIAGNOSIS — I420 Dilated cardiomyopathy: Secondary | ICD-10-CM

## 2022-12-30 DIAGNOSIS — I10 Essential (primary) hypertension: Secondary | ICD-10-CM

## 2022-12-31 DIAGNOSIS — H26492 Other secondary cataract, left eye: Secondary | ICD-10-CM | POA: Diagnosis not present

## 2022-12-31 DIAGNOSIS — H52203 Unspecified astigmatism, bilateral: Secondary | ICD-10-CM | POA: Diagnosis not present

## 2022-12-31 DIAGNOSIS — H04123 Dry eye syndrome of bilateral lacrimal glands: Secondary | ICD-10-CM | POA: Diagnosis not present

## 2022-12-31 DIAGNOSIS — H43813 Vitreous degeneration, bilateral: Secondary | ICD-10-CM | POA: Diagnosis not present

## 2022-12-31 DIAGNOSIS — H5212 Myopia, left eye: Secondary | ICD-10-CM | POA: Diagnosis not present

## 2022-12-31 DIAGNOSIS — H524 Presbyopia: Secondary | ICD-10-CM | POA: Diagnosis not present

## 2023-01-12 ENCOUNTER — Other Ambulatory Visit: Payer: Self-pay | Admitting: Internal Medicine

## 2023-01-12 DIAGNOSIS — I10 Essential (primary) hypertension: Secondary | ICD-10-CM

## 2023-01-12 DIAGNOSIS — I2510 Atherosclerotic heart disease of native coronary artery without angina pectoris: Secondary | ICD-10-CM

## 2023-01-12 NOTE — Telephone Encounter (Signed)
Patient is completely out of the medication and would like this filled until her appointment 02/17/2023

## 2023-01-14 ENCOUNTER — Encounter (HOSPITAL_BASED_OUTPATIENT_CLINIC_OR_DEPARTMENT_OTHER): Payer: Self-pay | Admitting: Cardiovascular Disease

## 2023-01-14 ENCOUNTER — Ambulatory Visit (HOSPITAL_BASED_OUTPATIENT_CLINIC_OR_DEPARTMENT_OTHER): Payer: PPO | Admitting: Cardiovascular Disease

## 2023-01-14 VITALS — BP 176/78 | HR 64 | Ht 65.0 in | Wt 149.8 lb

## 2023-01-14 DIAGNOSIS — I2510 Atherosclerotic heart disease of native coronary artery without angina pectoris: Secondary | ICD-10-CM | POA: Diagnosis not present

## 2023-01-14 DIAGNOSIS — I10 Essential (primary) hypertension: Secondary | ICD-10-CM | POA: Diagnosis not present

## 2023-01-14 DIAGNOSIS — Z9861 Coronary angioplasty status: Secondary | ICD-10-CM

## 2023-01-14 DIAGNOSIS — Z5181 Encounter for therapeutic drug level monitoring: Secondary | ICD-10-CM | POA: Diagnosis not present

## 2023-01-14 MED ORDER — ESCITALOPRAM OXALATE 5 MG PO TABS: 5.0000 mg | ORAL_TABLET | Freq: Every day | ORAL | 0 refills | Status: DC

## 2023-01-14 MED ORDER — METOPROLOL SUCCINATE ER 50 MG PO TB24: 50.0000 mg | ORAL_TABLET | Freq: Every day | ORAL | 3 refills | Status: DC

## 2023-01-14 MED ORDER — SPIRONOLACTONE 25 MG PO TABS: 25.0000 mg | ORAL_TABLET | Freq: Every day | ORAL | 3 refills | Status: DC

## 2023-01-14 NOTE — Patient Instructions (Addendum)
Medication Instructions:  STOP HYDRALAZINE   START SPIRONOLACTONE 25 MG DAILY   START LEXAPRO 5 MG DAILY, ADDITIONAL REFILLS FROM YOUR PRIMARY CARE   Labwork: BMET IN 1 WEEK   Testing/Procedures: NONE  Follow-Up: 1 MONTH WITH CAITLIN W NP, [place], OR PHARM D IN ADV HTN CLINICE   Any Other Special Instructions Will Be Listed Below (If Applicable). MONITOR BLOOD PRESSURE DAILY AND LOG IN BOOK PROVIDED. BRING BOOK AND MACHINE TO FOLLOW UP   If you need a refill on your cardiac medications before your next appointment, please call your pharmacy.

## 2023-01-14 NOTE — Progress Notes (Signed)
Advanced Hypertension Clinic Initial Assessment:    Date:  01/19/2023   ID:  Krystal [name], DOB Sep 25, 1929, MRN [id number]  PCP:  Etta Grandchild, MD  Cardiologist:  Nanetta Batty, MD  Nephrologist:  Referring MD: Avanell Shackleton, NP-C   CC: Hypertension  History of Present Illness:    Krystal [name] is a 89 y.o. female with a hx of CAD, hypertension, hyperlipidemia, GERD, CKD, anxiety, here to establish care in the Advanced [facility]pertension Clinic. On 11/17/2022 she presented to the ED with complaints of elevated blood pressures in the 200s for several days despite compliance with losartan, toprol xl, and hydralazine. She also complained of a headache and was concerned about possible stroke. Noted to have a recent left eye infection and had been prescribed prednisone drops which she completed. Chest x-ray was normal; head CT showed mild chronic small vessel ischemic changes, and tiny chronic infarct in right cerebellar hemisphere; no acute changes. EKG showed NSR at 60 bpm with LBBB. After intervention with 5 mg lopressor, her blood pressure improved to 173 systolic. She was discharged with an increase in hydralazine to 50 mg TID. She was seen by Hetty Blend, NP-C on 11/25/2022. Her daughter noted that blood pressures had been low a few days after discharge from the hospital and she cut back on her antihypertensives. She was taking hydralazine 50 mg TID, Toprol 50 mg in the AM, and losartan 50 mg in the PM. Per her BP logs morning readings were in the 150s systolic on average, improving to 130s-140s after taking medications. She was referred to the [facility]pert[facility] Clinic.   She was last seen by Dr. Allyson Sabal 01/03/2022. At that time she was on HCTZ, which was later discontinued due to hyponatremia.  Today, she is accompanied by her daughter. She has struggled with hypertension intermittently for 5-10 years. She states that she stays tensed and stressed a lot, sometimes inappropriately. In the  office her blood pressure is elevated to 190/97 initially, and 176/78 on manual recheck. She presents a BP log which is personally reviewed and shows 130s-140s/60s-70s on average, sometimes lower or higher. She notes that her blood pressures fluctuate a lot. She does not feel well with lower blood pressures. Since her hydralazine was increased to 50 mg TID, she has had episodes of hypotension. Occasionally she will take the 25 mg dose instead of 50 mg, which is what she did this morning. She is also stressed about her appointment today. No leg swelling since she stopped amlodipine several years ago. For exercise she routinely goes on walks and performs stretches. During exercise she denies anginal symptoms or shortness of breath. She prepares most of her meals at home. She is mostly conscientious of her sodium intake. Not much caffeine in her diet; she may have 1 cup of coffee at most. Alcohol consumption is limited to about 1 glass of wine, once a week. Current supplements include calcium and vitamin D3. For pain management she notes that she takes Zyrtec for sinus issues, and if that is ineffective then she will take Advil (about once a week). She denies snoring. She denies any palpitations, chest pain, shortness of breath, peripheral edema, lightheadedness, headaches, syncope, orthopnea, or PND.  Previous antihypertensives: HCTZ - hyponatremia Amlodipine - LE edema  Past Medical History:  Diagnosis Date   Adenomatous colon polyp    Allergy    seasonal   Anemia    pt. denies   Anxiety    CAD (coronary artery disease)  Cataract    cataracts removed left eye.   Chronic lower back pain    Diverticulosis    GERD (gastroesophageal reflux disease)    Headache(784.0)    Hiatal hernia    Hyperlipidemia    Hypertension    IBS (irritable bowel syndrome) 10/13/2011   Osteoarthritis    Osteoporosis    Renal cysts, acquired, bilateral    SBO (small bowel obstruction) (HCC) 09/05/2015   Venous  insufficiency     Past Surgical History:  Procedure Laterality Date   ABDOMINAL HYSTERECTOMY     BLADDER REPAIR     CARDIAC CATHETERIZATION  01/12/2002   90% stenosis of the left circumflex, stented with a 3x51mm Cypher stent, resulting in reduction of 90% to 10%    CARDIOVASCULAR STRESS TEST  03/22/2007   Mild inferolateral thinning toward the apex without significant ischemia. Nondiagnostic electrocardiogram.   CATARACT EXTRACTION     bilateral   COLONOSCOPY  08/17/2008   adenomatous polyp, diverticulosis, external hemorrhoids   COLONOSCOPY W/ BIOPSIES     multiple    LEFT LOWER EXTREMITY VENOUS DOPPLER  06/18/2011   No evidence of left lower extremity DVT   TRANSTHORACIC ECHOCARDIOGRAM  11/18/2012   EF 40-45%, LV systolic mild-moderately reduced, mild-moderate mitral valve regurg, mild-moderate tricuspid valve regurg. NSR-LBBB with occas PVCs   UPPER GASTROINTESTINAL ENDOSCOPY  07/03/2010   hiatal hernia   VARICOSE VEIN SURGERY      Current Medications: Current Meds  Medication Sig   acetaminophen (TYLENOL) 500 MG tablet Take 500 mg by mouth every 4 (four) hours as needed for mild pain or headache.   ALPRAZolam (XANAX) 0.5 MG tablet Take 1 tablet (0.5 mg total) by mouth at bedtime.   aspirin EC 81 MG tablet Take 81 mg by mouth daily.   Calcium Carbonate Antacid (CALCIUM CARBONATE PO) Take 1 tablet by mouth daily.   Cholecalciferol (VITAMIN D-3 PO) Take 1 capsule by mouth daily.   cycloSPORINE (RESTASIS) 0.05 % ophthalmic emulsion Place 1 drop into both eyes 2 (two) times daily.   escitalopram (LEXAPRO) 5 MG tablet Take 1 tablet (5 mg total) by mouth daily.   fluticasone (FLONASE) 50 MCG/ACT nasal spray Place 1 spray into both nostrils daily.   gabapentin (NEURONTIN) 100 MG capsule Take 1 capsule (100 mg total) by mouth at bedtime.   lansoprazole (PREVACID) 15 MG capsule Take 1 capsule (15 mg total) by mouth 2 (two) times daily before a meal.   losartan (COZAAR) 50 MG tablet TAKE  1 TABLET BY MOUTH TWICE A DAY   ondansetron (ZOFRAN-ODT) 4 MG disintegrating tablet TAKE 1 TABLET BY MOUTH EVERY 8 HOURS AS NEEDED FOR NAUSEA AND VOMITING   PREMARIN vaginal cream Place 1 Application vaginally See admin instructions. Apply vaginally 2-3 times a week as directed   rosuvastatin (CRESTOR) 10 MG tablet TAKE 1 TABLET BY MOUTH EVERY DAY   spironolactone (ALDACTONE) 25 MG tablet Take 1 tablet (25 mg total) by mouth daily.   [DISCONTINUED] hydrALAZINE (APRESOLINE) 50 MG tablet Take 1 tablet (50 mg total) by mouth 3 (three) times daily.   [DISCONTINUED] metoprolol succinate (TOPROL-XL) 50 MG 24 hr tablet Take 1 tablet (50 mg total) by mouth daily. Take with or immediately following a meal.     Allergies:   Lipitor [atorvastatin], Zocor [simvastatin], Doxycycline, Fosamax [alendronate], Levaquin [levofloxacin], Nexium [esomeprazole], Omnicef [cefdinir], Penicillins, Pneumococcal vaccine, Pneumovax [pneumococcal polysaccharide vaccine], Prilosec [omeprazole], Norvasc [amlodipine], Shellfish allergy, and Shellfish-derived products   Social History   Socioeconomic History  Marital status: Married    Spouse name: Chrissie Noa   Number of children: 2   Years of education: Not on file   Highest education level: Not on file  Occupational History   Occupation: retired  Tobacco Use   Smoking status: Never   Smokeless tobacco: Never  Vaping Use   Vaping status: Never Used  Substance and Sexual Activity   Alcohol use: No    Alcohol/week: 0.0 standard drinks of alcohol   Drug use: No   Sexual activity: Not on file  Other Topics Concern   Not on file  Social History Narrative   Married to [place] who has congestive heart failure problems   She is retired never smoker rare alcohol   Social Determinants of Corporate investment banker Strain: Low Risk  (08/22/2022)   Overall Financial Resource Strain (CARDIA)    Difficulty of Paying Living Expenses: Not hard at all  Food Insecurity: No Food  Insecurity (08/22/2022)   Hunger Vital Sign    Worried About Running Out of Food in the Last Year: Never true    Ran Out of Food in the Last Year: Never true  Transportation Needs: No Transportation Needs (08/22/2022)   PRAPARE - Administrator, Civil Service (Medical): No    Lack of Transportation (Non-Medical): No  Physical Activity: Insufficiently Active (08/22/2022)   Exercise Vital Sign    Days of Exercise per Week: 4 days    Minutes of Exercise per Session: 30 min  Stress: No Stress Concern Present (08/22/2022)   [employer] of Occupational Health - Occupational Stress Questionnaire    Feeling of Stress : Not at all  Social Connections: Moderately Isolated (08/22/2022)   Social Connection and Isolation Panel [NHANES]    Frequency of Communication with Friends and Family: More than three times a week    Frequency of Social Gatherings with Friends and Family: Twice a week    Attends Religious Services: Never    Database administrator or Organizations: No    Attends Engineer, structural: Never    Marital Status: Married     Family History: The patient's family history includes Heart attack in her father; Heart disease in her father; Stroke in her sister. There is no history of Colon cancer.  ROS:   Please see the history of present illness.    (+) Stress All other systems reviewed and are negative.  EKGs/Labs/Other Studies Reviewed:    CT Head  11/17/2022: IMPRESSION: 1. No evidence of an acute intracranial abnormality on this non-contrast exam. 2. Mild chronic small vessel ischemic changes within the cerebral white matter. 3. 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  EKG:  EKG is personally reviewed. 01/14/2023: Not ordered.  Recent Labs: 01/26/2022: TSH 2.260 01/29/2022: Magnesium 1.9 04/08/2022: ALT 14 11/17/2022: BUN 17; Creatinine, Ser 0.93; Hemoglobin 13.4; Platelets 201; Potassium 4.5; Sodium 136   Recent Lipid Panel     Component Value Date/Time   CHOL 134 07/03/2022 1057   CHOL 137 04/28/2017 0848   TRIG 107.0 07/03/2022 1057   HDL 48.40 07/03/2022 1057   HDL 44 04/28/2017 0848   CHOLHDL 3 07/03/2022 1057   VLDL 21.4 07/03/2022 1057   LDLCALC 64 07/03/2022 1057   LDLCALC 70 04/28/2017 0848   LDLDIRECT 160.4 12/19/2008 0815    Physical Exam:    VS:  BP (!) 176/78 (BP Location: Left Arm, Patient Position: Sitting, Cuff Size: Normal)   Pulse 64   Ht  5\' 5"  (1.651 m)   Wt 149 lb 12.8 oz (67.9 kg)   SpO2 97%   BMI 24.93 kg/m  , BMI Body mass index is 24.93 kg/m. GENERAL:  Well appearing HEENT: Pupils equal round and reactive, fundi not visualized, oral mucosa unremarkable NECK:  No jugular venous distention, waveform within normal limits, carotid upstroke brisk and symmetric, no bruits, no thyromegaly LUNGS:  Clear to auscultation bilaterally HEART:  RRR.  PMI not displaced or sustained, S1 and S2 within normal limits, no S3, no S4, no clicks, no rubs, no murmurs ABD:  Flat, positive bowel sounds normal in frequency in pitch, no bruits, no rebound, no guarding, no midline pulsatile mass, no hepatomegaly, no splenomegaly EXT:  2 plus pulses throughout, no edema, no cyanosis, no clubbing SKIN:  No rashes, no nodules NEURO:  Cranial nerves II through XII grossly intact, motor grossly intact throughout PSYCH:  Cognitively intact, oriented to person place and time   ASSESSMENT/PLAN:    # Hypertension # Orthostatic hypotension Difficult to control, with variable readings at home. History of hyponatremia with hydrochlorothiazide and lower extremity edema with amlodipine. Currently on hydralazine, which is difficult to manage due to TID dosing and variable blood pressure readings. -Discontinue hydralazine. -Start spironolactone 25mg  daily. -Check basic metabolic panel in 1 week to assess sodium, potassium, and kidney function.  # Anxiety Reports feeling tense frequently, which may be contributing  to variable blood pressure readings. No current treatment for anxiety. -Start Lexapro 5mg  daily. -Continue Xanax as needed for acute anxiety episodes until Lexapro takes effect.  Follow-up in 1 month to reassess blood pressure control and response to Lexapro. Continue home blood pressure monitoring.      Screening for Secondary Hypertension:     01/14/2023    2:31 PM  Causes  Drugs/Herbals Screened     - Comments limits sodium.  no caffeine.  Rare EtOH.  Occasional NSAIDS  Sleep Apnea Screened    Relevant Labs/Studies:    Latest Ref Rng & Units 11/17/2022    9:06 AM 07/03/2022   10:57 AM 04/08/2022    4:00 PM  Basic Labs  Sodium 135 - 145 mmol/L 136  139  136   Potassium 3.5 - 5.1 mmol/L 4.5  4.5  3.7   Creatinine 0.44 - 1.00 mg/dL 1.61  0.96  0.45        Latest Ref Rng & Units 01/26/2022    9:50 PM 06/26/2021    3:29 PM  Thyroid   TSH 0.350 - 4.500 uIU/mL 2.260  1.61     Disposition:    FU with Advanced HTN clinic in 1 month.     Medication Adjustments/Labs and Tests Ordered: Current medicines are reviewed at length with the patient today.  Concerns regarding medicines are outlined above.   Orders Placed This Encounter  Procedures   Basic metabolic panel   Meds ordered this encounter  Medications   spironolactone (ALDACTONE) 25 MG tablet    Sig: Take 1 tablet (25 mg total) by mouth daily.    Dispense:  90 tablet    Refill:  3    D/C HYDRALAZINE   escitalopram (LEXAPRO) 5 MG tablet    Sig: Take 1 tablet (5 mg total) by mouth daily.    Dispense:  90 tablet    Refill:  0   metoprolol succinate (TOPROL-XL) 50 MG 24 hr tablet    Sig: Take 1 tablet (50 mg total) by mouth daily. Take with or immediately following a meal.  Dispense:  90 tablet    Refill:  3   I,Mathew Stumpf,acting as a scribe for Chilton Si, MD.,have documented all relevant documentation on the behalf of Chilton Si, MD,as directed by  Chilton Si, MD while in the presence of Chilton Si, MD.  I, Mel Tadros C. Duke Salvia, MD have reviewed all documentation for this visit.  The documentation of the exam, diagnosis, procedures, and orders on 01/19/2023 are all accurate and complete.   Signed, Chilton Si, MD  01/19/2023 3:06 PM    [facility]

## 2023-01-14 NOTE — Telephone Encounter (Signed)
Patient would like the nurse to call her:  [phone]

## 2023-01-16 ENCOUNTER — Ambulatory Visit: Payer: PPO

## 2023-01-16 DIAGNOSIS — Z23 Encounter for immunization: Secondary | ICD-10-CM

## 2023-01-16 NOTE — Progress Notes (Signed)
Patient presented in office today for high dose flu shot. High dose flu shot was administered into her left deltoid. Patient tolerated injection well and injection site looked fine. Patient was advised to report to the office immediately if she noticed any adverse reaction.

## 2023-01-19 ENCOUNTER — Encounter (HOSPITAL_BASED_OUTPATIENT_CLINIC_OR_DEPARTMENT_OTHER): Payer: Self-pay | Admitting: Cardiovascular Disease

## 2023-01-21 DIAGNOSIS — I10 Essential (primary) hypertension: Secondary | ICD-10-CM | POA: Diagnosis not present

## 2023-01-21 DIAGNOSIS — Z5181 Encounter for therapeutic drug level monitoring: Secondary | ICD-10-CM | POA: Diagnosis not present

## 2023-01-22 LAB — BASIC METABOLIC PANEL
BUN/Creatinine Ratio: 13 (ref 12–28)
BUN: 15 mg/dL (ref 10–36)
CO2: 25 mmol/L (ref 20–29)
Calcium: 9.7 mg/dL (ref 8.7–10.3)
Chloride: 102 mmol/L (ref 96–106)
Creatinine, Ser: 1.2 mg/dL — ABNORMAL HIGH (ref 0.57–1.00)
Glucose: 89 mg/dL (ref 70–99)
Potassium: 4.9 mmol/L (ref 3.5–5.2)
Sodium: 138 mmol/L (ref 134–144)
eGFR: 43 mL/min/{1.73_m2} — ABNORMAL LOW (ref >59)

## 2023-01-23 ENCOUNTER — Telehealth: Payer: Self-pay | Admitting: Cardiovascular Disease

## 2023-01-23 NOTE — Telephone Encounter (Signed)
Spoke with daughter regarding mothers medications Per daughter patient does not take her medications on a regular basis. If she feels bad one day she blames it on her medications and she will not take the next day. Was hoping that could be discussed at upcoming visit.  Daughter also stated patient could not hear very well but acts like she can  Advised would forward to Dr Hazle Coca team so they would be aware at next visit

## 2023-01-23 NOTE — Telephone Encounter (Signed)
Pt's daughter is requesting a callback regarding pt still not taking her medications. Please advise

## 2023-02-04 ENCOUNTER — Encounter: Payer: Self-pay | Admitting: Cardiovascular Disease

## 2023-02-04 ENCOUNTER — Ambulatory Visit: Payer: PPO | Attending: Cardiovascular Disease | Admitting: Cardiovascular Disease

## 2023-02-04 VITALS — BP 130/64 | HR 63 | Ht 65.0 in | Wt 145.0 lb

## 2023-02-04 DIAGNOSIS — I2510 Atherosclerotic heart disease of native coronary artery without angina pectoris: Secondary | ICD-10-CM | POA: Diagnosis not present

## 2023-02-04 DIAGNOSIS — I5022 Chronic systolic (congestive) heart failure: Secondary | ICD-10-CM

## 2023-02-04 DIAGNOSIS — I10 Essential (primary) hypertension: Secondary | ICD-10-CM

## 2023-02-04 DIAGNOSIS — E785 Hyperlipidemia, unspecified: Secondary | ICD-10-CM

## 2023-02-04 DIAGNOSIS — Z9861 Coronary angioplasty status: Secondary | ICD-10-CM | POA: Diagnosis not present

## 2023-02-04 NOTE — Assessment & Plan Note (Signed)
History of CAD status post Cypher drug-eluting stenting of her circumflex coronary artery by Dr. Juanda Chance in 2003.  She has remained asymptomatic since that time.

## 2023-02-04 NOTE — Assessment & Plan Note (Signed)
History of hyperlipidemia on statin therapy with lipid profile performed 4//24 revealing a total cholesterol of 134, LDL of 64 and HDL 48.

## 2023-02-04 NOTE — Assessment & Plan Note (Signed)
History of essential hypertension her blood pressure measured today at 130/64.  She is on losartan, metoprolol, and was recently started on spironolactone by Dr. Duke Salvia in the advanced hypertension clinic.  Her blood pressure has been under better control.

## 2023-02-04 NOTE — Progress Notes (Signed)
02/04/2023 Krystal [name]   07/12/1929  [id number]  Primary Physician Etta Grandchild, MD Primary Cardiologist: Runell Gess MD Nicholes Calamity, [place]  HPI:  Krystal [name] is a 89 y.o.  mildly overweight married Caucasian female mother of 2 children, grandmother of 3 grandchildren, whose husband Krystal [name] is also a patient of mine.  Bill unfortunately is currently being treated for bladder cancer.  They were both patients of Dr. Rocco Serene. I last saw her in the office 10 01/03/2022 22..  Her past history is remarkable for treated hypertension and hyperlipidemia. Her father did die of a myocardial infarction. She has never had a heart attack or stroke. She does not smoke. She had a Cypher drug-eluting stent placed in her circumflex coronary artery by Dr. Charlies Constable in 2003. Her other arteries were normal as was her LV function.  She did have a 2D echo performed 11/18/2012 that showed an EF in the 45% range.    Since I saw her in the office a year ago she continues to do well.  She walks 5 days a week 30 to 45 minutes at a time with her husband Krystal [name].  She is completely asymptomatic.  She was seen by Dr. Chilton Si and advanced hypertension clinic and was begun on spironolactone which is resulted in improvement in her blood pressure.   Current Meds  Medication Sig   acetaminophen (TYLENOL) 500 MG tablet Take 500 mg by mouth every 4 (four) hours as needed for mild pain or headache.   ALPRAZolam (XANAX) 0.5 MG tablet Take 1 tablet (0.5 mg total) by mouth at bedtime.   aspirin EC 81 MG tablet Take 81 mg by mouth daily.   Calcium Carbonate Antacid (CALCIUM CARBONATE PO) Take 1 tablet by mouth daily.   Cholecalciferol (VITAMIN D-3 PO) Take 1 capsule by mouth daily.   cycloSPORINE (RESTASIS) 0.05 % ophthalmic emulsion Place 1 drop into both eyes 2 (two) times daily.   fluticasone (FLONASE) 50 MCG/ACT nasal spray Place 1 spray into both nostrils daily.   lansoprazole (PREVACID) 15  MG capsule Take 1 capsule (15 mg total) by mouth 2 (two) times daily before a meal.   loratadine (CLARITIN) 10 MG tablet TAKE 1 TABLET BY MOUTH EVERY DAY   losartan (COZAAR) 50 MG tablet TAKE 1 TABLET BY MOUTH TWICE A DAY   metoprolol succinate (TOPROL-XL) 50 MG 24 hr tablet Take 1 tablet (50 mg total) by mouth daily. Take with or immediately following a meal.   ondansetron (ZOFRAN-ODT) 4 MG disintegrating tablet TAKE 1 TABLET BY MOUTH EVERY 8 HOURS AS NEEDED FOR NAUSEA AND VOMITING   PREMARIN vaginal cream Place 1 Application vaginally See admin instructions. Apply vaginally 2-3 times a week as directed   rosuvastatin (CRESTOR) 10 MG tablet TAKE 1 TABLET BY MOUTH EVERY DAY   spironolactone (ALDACTONE) 25 MG tablet Take 1 tablet (25 mg total) by mouth daily.   Tavaborole (KERYDIN) 5 % SOLN Apply 1 drop topically daily. Apply 1 drop to the toenail daily.     Allergies  Allergen Reactions   Lipitor [Atorvastatin] Other (See Comments)    Myalgias    Zocor [Simvastatin] Other (See Comments)    Myalgias    Doxycycline     diarrhea   Fosamax [Alendronate] Other (See Comments)    Esophagitis   Levaquin [Levofloxacin] Diarrhea   Nexium [Esomeprazole] Nausea Only and Other (See Comments)    Stomach pains/burning sensation   Omnicef [Cefdinir] Diarrhea  Penicillins Hives   Pneumococcal Vaccine Other (See Comments)    Fatigue and aches that lasted 6+ months   Pneumovax [Pneumococcal Polysaccharide Vaccine]    Prilosec [Omeprazole] Nausea Only and Other (See Comments)    Stomach pains/burning sensation   Norvasc [Amlodipine] Swelling    Ankle edema   Shellfish Allergy Hives, Nausea Only and Rash   Shellfish-Derived Products Hives, Nausea Only and Rash    Social History   Socioeconomic History   Marital status: Married    Spouse name: Krystal [name]   Number of children: 2   Years of education: Not on file   Highest education level: Not on file  Occupational History   Occupation: retired   Tobacco Use   Smoking status: Never   Smokeless tobacco: Never  Vaping Use   Vaping status: Never Used  Substance and Sexual Activity   Alcohol use: No    Alcohol/week: 0.0 standard drinks of alcohol   Drug use: No   Sexual activity: Not on file  Other Topics Concern   Not on file  Social History Narrative   Married to [place] who has congestive heart failure problems   She is retired never smoker rare alcohol   Social Determinants of Corporate investment banker Strain: Low Risk  (08/22/2022)   Overall Financial Resource Strain (CARDIA)    Difficulty of Paying Living Expenses: Not hard at all  Food Insecurity: No Food Insecurity (08/22/2022)   Hunger Vital Sign    Worried About Running Out of Food in the Last Year: Never true    Ran Out of Food in the Last Year: Never true  Transportation Needs: No Transportation Needs (08/22/2022)   PRAPARE - Administrator, Civil Service (Medical): No    Lack of Transportation (Non-Medical): No  Physical Activity: Insufficiently Active (08/22/2022)   Exercise Vital Sign    Days of Exercise per Week: 4 days    Minutes of Exercise per Session: 30 min  Stress: No Stress Concern Present (08/22/2022)   [employer] of Occupational Health - Occupational Stress Questionnaire    Feeling of Stress : Not at all  Social Connections: Moderately Isolated (08/22/2022)   Social Connection and Isolation Panel [NHANES]    Frequency of Communication with Friends and Family: More than three times a week    Frequency of Social Gatherings with Friends and Family: Twice a week    Attends Religious Services: Never    Database administrator or Organizations: No    Attends Banker Meetings: Never    Marital Status: Married  Catering manager Violence: Not At Risk (08/22/2022)   Humiliation, Afraid, Rape, and Kick questionnaire    Fear of Current or Ex-Partner: No    Emotionally Abused: No    Physically Abused: No    Sexually Abused: No      Review of Systems: General: negative for chills, fever, night sweats or weight changes.  Cardiovascular: negative for chest pain, dyspnea on exertion, edema, orthopnea, palpitations, paroxysmal nocturnal dyspnea or shortness of breath Dermatological: negative for rash Respiratory: negative for cough or wheezing Urologic: negative for hematuria Abdominal: negative for nausea, vomiting, diarrhea, bright red blood per rectum, melena, or hematemesis Neurologic: negative for visual changes, syncope, or dizziness All other systems reviewed and are otherwise negative except as noted above.    Blood pressure 130/64, pulse 63, height 5\' 5"  (1.651 m), weight 145 lb (65.8 kg), SpO2 97%.  General appearance: alert and no distress Neck: no  adenopathy, no carotid bruit, no JVD, supple, symmetrical, trachea midline, and thyroid not enlarged, symmetric, no tenderness/mass/nodules Lungs: clear to auscultation bilaterally Heart: regular rate and rhythm, S1, S2 normal, no murmur, click, rub or gallop Extremities: extremities normal, atraumatic, no cyanosis or edema Pulses: 2+ and symmetric Skin: Skin color, texture, turgor normal. No rashes or lesions Neurologic: Grossly normal  EKG not performed today      ASSESSMENT AND PLAN:   Hyperlipidemia with target LDL less than 70 History of hyperlipidemia on statin therapy with lipid profile performed 4//24 revealing a total cholesterol of 134, LDL of 64 and HDL 48.  Essential hypertension History of essential hypertension her blood pressure measured today at 130/64.  She is on losartan, metoprolol, and was recently started on spironolactone by Dr. Duke Salvia in the advanced hypertension clinic.  Her blood pressure has been under better control.  CAD S/P percutaneous coronary angioplasty History of CAD status post Cypher drug-eluting stenting of her circumflex coronary artery by Dr. Juanda Chance in 2003.  She has remained asymptomatic since that  time.  Chronic systolic CHF (congestive heart failure) (HCC) History of nonischemic cardiomyopathy with an EF of 40% by 2D echo in 2014.  She is asymptomatic from this and on appropriate medications.     Runell Gess MD FACP,FACC,FAHA, [facility] 02/04/2023 11:07 AM

## 2023-02-04 NOTE — Patient Instructions (Signed)
Medication Instructions:   No changes *If you need a refill on your cardiac medications before your next appointment, please call your pharmacy*   Lab Work: Not  needed    Testing/Procedures:  Not needed  Follow-Up: At [facility], you and your health needs are our priority.  As part of our continuing mission to provide you with exceptional heart care, we have created designated Provider Care Teams.  These Care Teams include your primary Cardiologist (physician) and Advanced Practice Providers (APPs -  Physician Assistants and Nurse Practitioners) who all work together to provide you with the care you need, when you need it.     Your next appointment:   12 month(s)  The format for your next appointment:   In Person  Provider:   Nanetta Batty, MD

## 2023-02-04 NOTE — Assessment & Plan Note (Signed)
History of nonischemic cardiomyopathy with an EF of 40% by 2D echo in 2014.  She is asymptomatic from this and on appropriate medications.

## 2023-02-17 ENCOUNTER — Encounter: Payer: Self-pay | Admitting: Internal Medicine

## 2023-02-17 ENCOUNTER — Ambulatory Visit: Payer: PPO | Admitting: Internal Medicine

## 2023-02-17 VITALS — BP 144/86 | HR 72 | Temp 97.9°F | Ht 65.0 in | Wt 144.4 lb

## 2023-02-17 DIAGNOSIS — I2510 Atherosclerotic heart disease of native coronary artery without angina pectoris: Secondary | ICD-10-CM

## 2023-02-17 DIAGNOSIS — J301 Allergic rhinitis due to pollen: Secondary | ICD-10-CM

## 2023-02-17 DIAGNOSIS — K581 Irritable bowel syndrome with constipation: Secondary | ICD-10-CM

## 2023-02-17 DIAGNOSIS — E785 Hyperlipidemia, unspecified: Secondary | ICD-10-CM | POA: Diagnosis not present

## 2023-02-17 DIAGNOSIS — I10 Essential (primary) hypertension: Secondary | ICD-10-CM | POA: Diagnosis not present

## 2023-02-17 DIAGNOSIS — R062 Wheezing: Secondary | ICD-10-CM

## 2023-02-17 MED ORDER — FLUTICASONE PROPIONATE 50 MCG/ACT NA SUSP
1.0000 | Freq: Every day | NASAL | 1 refills | Status: DC
Start: 2023-02-17 — End: 2023-12-04

## 2023-02-17 MED ORDER — ALBUTEROL SULFATE HFA 108 (90 BASE) MCG/ACT IN AERS
INHALATION_SPRAY | RESPIRATORY_TRACT | 1 refills | Status: DC
Start: 2023-02-17 — End: 2024-02-11

## 2023-02-17 MED ORDER — ROSUVASTATIN CALCIUM 10 MG PO TABS
10.0000 mg | ORAL_TABLET | Freq: Every day | ORAL | 1 refills | Status: DC
Start: 2023-02-17 — End: 2023-08-17

## 2023-02-17 MED ORDER — ONDANSETRON 4 MG PO TBDP
4.0000 mg | ORAL_TABLET | Freq: Three times a day (TID) | ORAL | 1 refills | Status: DC | PRN
Start: 2023-02-17 — End: 2023-07-06

## 2023-02-17 NOTE — Progress Notes (Unsigned)
Subjective:  Patient ID: Krystal [name], female    DOB: [birth year]  Age: 89 y.o. MRN: [id number]  CC: Hypertension, Hyperlipidemia, and Allergic Rhinitis    HPI Krystal [name] presents for f/up ----  Discussed the use of AI scribe software for clinical note transcription with the patient, who gave verbal consent to proceed.  History of Present Illness   The patient, with a history of hypertension, has been managing her condition losartan. Recently, she was taken off her blood pressure medication and started on a diuretic, which has resulted in well-controlled blood pressure readings. She has been experiencing occasional nausea but denies any headache or blurred vision.  The patient also reports a recent illness lasting a couple of weeks, characterized by a general feeling of being unwell, some coughing, and loss of appetite leading to weight loss. She denies any abdominal pain, fever, chills, or diarrhea. The illness has since resolved, and the patient is no longer experiencing any symptoms.  In addition to the above, the patient has been dealing with mucus production, particularly in the evenings. The mucus is clear and is often accompanied with wheezing. She has been managing these symptoms with Zyrtec, which she reports is helping. She also mentions the use of an inhaler in the past, which she found beneficial.  The patient's medication regimen includes losartan, taken once at night, and she was previously taking it twice a day. She also mentions the need for refills on her Toprol and a medication for nausea.       Outpatient Medications Prior to Visit  Medication Sig Dispense Refill   acetaminophen (TYLENOL) 500 MG tablet Take 500 mg by mouth every 4 (four) hours as needed for mild pain or headache.     ALPRAZolam (XANAX) 0.5 MG tablet Take 1 tablet (0.5 mg total) by mouth at bedtime. 90 tablet 1   aspirin EC 81 MG tablet Take 81 mg by mouth daily.     Calcium Carbonate Antacid  (CALCIUM CARBONATE PO) Take 1 tablet by mouth daily.     Cholecalciferol (VITAMIN D-3 PO) Take 1 capsule by mouth daily.     cycloSPORINE (RESTASIS) 0.05 % ophthalmic emulsion Place 1 drop into both eyes 2 (two) times daily.     lansoprazole (PREVACID) 15 MG capsule Take 1 capsule (15 mg total) by mouth 2 (two) times daily before a meal. 180 capsule 3   losartan (COZAAR) 50 MG tablet TAKE 1 TABLET BY MOUTH TWICE A DAY 180 tablet 1   metoprolol succinate (TOPROL-XL) 50 MG 24 hr tablet Take 1 tablet (50 mg total) by mouth daily. Take with or immediately following a meal. 90 tablet 3   PREMARIN vaginal cream Place 1 Application vaginally See admin instructions. Apply vaginally 2-3 times a week as directed     spironolactone (ALDACTONE) 25 MG tablet Take 1 tablet (25 mg total) by mouth daily. 90 tablet 3   Tavaborole (KERYDIN) 5 % SOLN Apply 1 drop topically daily. Apply 1 drop to the toenail daily. 10 mL 2   albuterol (VENTOLIN HFA) 108 (90 Base) MCG/ACT inhaler TAKE 2 PUFFS BY MOUTH EVERY 6 HOURS AS NEEDED FOR WHEEZE OR SHORTNESS OF BREATH 8.5 each 1   fluticasone (FLONASE) 50 MCG/ACT nasal spray Place 1 spray into both nostrils daily. 48 mL 1   loratadine (CLARITIN) 10 MG tablet TAKE 1 TABLET BY MOUTH EVERY DAY 90 tablet 3   ondansetron (ZOFRAN-ODT) 4 MG disintegrating tablet TAKE 1 TABLET BY MOUTH EVERY 8  HOURS AS NEEDED FOR NAUSEA AND VOMITING 18 tablet 1   rosuvastatin (CRESTOR) 10 MG tablet TAKE 1 TABLET BY MOUTH EVERY DAY 90 tablet 1   ciclopirox (PENLAC) 8 % solution Apply topically at bedtime. Apply over nail and surrounding skin. Apply daily over previous coat. After seven (7) days, may remove with alcohol and continue cycle. 6.6 mL 2   escitalopram (LEXAPRO) 5 MG tablet Take 1 tablet (5 mg total) by mouth daily. 90 tablet 0   gabapentin (NEURONTIN) 100 MG capsule Take 1 capsule (100 mg total) by mouth at bedtime. (Patient not taking: Reported on 02/04/2023) 90 capsule 0   No  facility-administered medications prior to visit.    ROS Review of Systems  Constitutional:  Negative for chills, diaphoresis, fatigue and fever.  HENT: Negative.  Negative for trouble swallowing.   Eyes: Negative.   Respiratory:  Positive for wheezing. Negative for cough, chest tightness and shortness of breath.   Cardiovascular:  Negative for chest pain, palpitations and leg swelling.  Gastrointestinal:  Positive for nausea. Negative for abdominal pain, diarrhea and vomiting.  Endocrine: Negative.   Genitourinary: Negative.  Negative for difficulty urinating.  Musculoskeletal: Negative.  Negative for arthralgias and myalgias.  Skin: Negative.   Neurological: Negative.  Negative for dizziness and weakness.  Hematological:  Negative for adenopathy. Does not bruise/bleed easily.  Psychiatric/Behavioral: Negative.      Objective:  BP (!) 144/86 (BP Location: Left Arm, Patient Position: Sitting, Cuff Size: Normal)   Pulse 72   Temp 97.9 F (36.6 C) (Oral)   Ht 5\' 5"  (1.651 m)   Wt 144 lb 6.4 oz (65.5 kg)   SpO2 98%   BMI 24.03 kg/m   BP Readings from Last 3 Encounters:  02/17/23 (!) 144/86  02/04/23 130/64  01/14/23 (!) 176/78    Wt Readings from Last 3 Encounters:  02/17/23 144 lb 6.4 oz (65.5 kg)  02/04/23 145 lb (65.8 kg)  01/14/23 149 lb 12.8 oz (67.9 kg)    Physical Exam Vitals reviewed.  Constitutional:      Appearance: Normal appearance.  HENT:     Mouth/Throat:     Mouth: Mucous membranes are moist.  Eyes:     General: No scleral icterus.    Conjunctiva/sclera: Conjunctivae normal.  Cardiovascular:     Rate and Rhythm: Normal rate and regular rhythm.     Heart sounds: No murmur heard. Pulmonary:     Effort: Pulmonary effort is normal.     Breath sounds: No stridor. No wheezing, rhonchi or rales.  Abdominal:     General: Abdomen is flat.     Palpations: There is no mass.     Tenderness: There is no abdominal tenderness. There is no guarding.      Hernia: No hernia is present.  Musculoskeletal:        General: Normal range of motion.     Cervical back: Neck supple.     Right lower leg: No edema.     Left lower leg: No edema.  Lymphadenopathy:     Cervical: No cervical adenopathy.  Skin:    General: Skin is warm and dry.  Neurological:     General: No focal deficit present.     Mental Status: She is alert. Mental status is at baseline.  Psychiatric:        Mood and Affect: Mood normal.        Behavior: Behavior normal.     Lab Results  Component Value Date  WBC 6.4 11/17/2022   HGB 13.4 11/17/2022   HCT 41.9 11/17/2022   PLT 201 11/17/2022   GLUCOSE 89 01/21/2023   CHOL 134 07/03/2022   TRIG 107.0 07/03/2022   HDL 48.40 07/03/2022   LDLDIRECT 160.4 12/19/2008   LDLCALC 64 07/03/2022   ALT 14 04/08/2022   AST 17 04/08/2022   NA 138 01/21/2023   K 4.9 01/21/2023   CL 102 01/21/2023   CREATININE 1.20 (H) 01/21/2023   BUN 15 01/21/2023   CO2 25 01/21/2023   TSH 2.260 01/26/2022   HGBA1C 6.0 07/03/2022    CT Head Wo Contrast  Result Date: 11/17/2022 CLINICAL DATA:  Provided history: Headache, new onset. Additional history provided: Recent left eye infection. Headache. Nausea. EXAM: CT HEAD WITHOUT CONTRAST TECHNIQUE: Contiguous axial images were obtained from the base of the skull through the vertex without intravenous contrast. RADIATION DOSE REDUCTION: This exam was performed according to the departmental dose-optimization program which includes automated exposure control, adjustment of the mA and/or kV according to patient size and/or use of iterative reconstruction technique. COMPARISON:  None. FINDINGS: Brain: No age advanced or lobar predominant parenchymal atrophy. Patchy and ill-defined hypoattenuation within the cerebral white matter, nonspecific but compatible with mild chronic small vessel ischemic disease. 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 There is no acute intracranial hemorrhage.  No demarcated cortical infarct. No extra-axial fluid collection. No evidence of an intracranial mass. No midline shift. Vascular: No hyperdense vessel.  Atherosclerotic calcifications. Skull: No calvarial fracture or aggressive osseous lesion. Sinuses/Orbits: No mass or acute finding within the imaged orbits. No significant paranasal sinus disease at the imaged levels. IMPRESSION: 1. No evidence of an acute intracranial abnormality on this non-contrast exam. 2. Mild chronic small vessel ischemic changes within the cerebral white matter. 3. 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 Electronically Signed   By: Jackey Loge D.O.   On: 11/17/2022 10:57   DG Chest 2 View  Result Date: 11/17/2022 CLINICAL DATA:  Hypertension. Headache. Denies vision or chest pain EXAM: CHEST - 2 VIEW COMPARISON:  CXR 04/08/22 FINDINGS: No pleural effusion. No pneumothorax. No focal airspace opacity. Normal cardiac and mediastinal contours. No radiographically apparent displaced rib fractures. Visualized upper abdomen is unremarkable. Vertebral body heights are unchanged compared to prior exam. IMPRESSION: No focal airspace opacity Electronically Signed   By: Lorenza Cambridge M.D.   On: 11/17/2022 10:00    Assessment & Plan:  Irritable bowel syndrome with constipation -     Ondansetron; Take 1 tablet (4 mg total) by mouth every 8 (eight) hours as needed for nausea or vomiting.  Dispense: 18 tablet; Refill: 1  Essential hypertension- Her BP is adequately well controlled.  Non-seasonal allergic rhinitis due to pollen -     Fluticasone Propionate; Place 1 spray into both nostrils daily.  Dispense: 48 mL; Refill: 1  Wheezing -     Albuterol Sulfate HFA; TAKE 2 PUFFS BY MOUTH EVERY 6 HOURS AS NEEDED FOR WHEEZE OR SHORTNESS OF BREATH  Dispense: 8.5 each; Refill: 1  Hyperlipidemia with target LDL less than 70 -     Rosuvastatin Calcium; Take 1 tablet (10 mg total) by mouth daily.  Dispense: 90 tablet; Refill:  1  Atherosclerosis of native coronary artery without angina pectoris, unspecified whether native or transplanted heart -     Rosuvastatin Calcium; Take 1 tablet (10 mg total) by mouth daily.  Dispense: 90 tablet; Refill: 1     Follow-up: No follow-ups on file.  Sanda Linger, MD

## 2023-02-18 ENCOUNTER — Other Ambulatory Visit (HOSPITAL_BASED_OUTPATIENT_CLINIC_OR_DEPARTMENT_OTHER): Payer: Self-pay | Admitting: *Deleted

## 2023-02-18 DIAGNOSIS — N289 Disorder of kidney and ureter, unspecified: Secondary | ICD-10-CM

## 2023-02-18 DIAGNOSIS — R062 Wheezing: Secondary | ICD-10-CM | POA: Insufficient documentation

## 2023-02-18 DIAGNOSIS — Z5181 Encounter for therapeutic drug level monitoring: Secondary | ICD-10-CM

## 2023-02-18 DIAGNOSIS — I10 Essential (primary) hypertension: Secondary | ICD-10-CM

## 2023-02-18 DIAGNOSIS — I5022 Chronic systolic (congestive) heart failure: Secondary | ICD-10-CM

## 2023-02-19 ENCOUNTER — Ambulatory Visit (HOSPITAL_BASED_OUTPATIENT_CLINIC_OR_DEPARTMENT_OTHER): Payer: PPO | Admitting: Pharmacist Clinician (PhC)/ Clinical Pharmacy Specialist

## 2023-02-19 VITALS — BP 153/88 | HR 60 | Ht 65.0 in | Wt 143.8 lb

## 2023-02-19 DIAGNOSIS — I10 Essential (primary) hypertension: Secondary | ICD-10-CM

## 2023-02-19 MED ORDER — SERTRALINE HCL 25 MG PO TABS: 25.0000 mg | ORAL_TABLET | Freq: Every day | ORAL | 5 refills | Status: DC

## 2023-02-19 NOTE — Patient Instructions (Signed)
Follow up appointment: in 6 months with Dr. Duke Salvia or Gillian Shields  Take your BP meds as follows:  Continue with your current medications   You can try sertraline 25 mg once daily to help with anxiety  Check your blood pressure at home daily (if able) and keep record of the readings.  Hypertension "High blood pressure"  Hypertension is often called "The Silent Killer." It rarely causes symptoms until it is extremely  high or has done damage to other organs in the body. For this reason, you should have your  blood pressure checked regularly by your physician. We will check your blood pressure  every time you see a provider at one of our offices.   Your blood pressure reading consists of two numbers. Ideally, blood pressure should be  below 120/80. The first ("top") number is called the systolic pressure. It measures the  pressure in your arteries as your heart beats. The second ("bottom") number is called the diastolic pressure. It measures the pressure in your arteries as the heart relaxes between beats.  The benefits of getting your blood pressure under control are enormous. A 10-point  reduction in systolic blood pressure can reduce your risk of stroke by 27% and heart failure by 28%  Your blood pressure goal is < 130/80  To check your pressure at home you will need to:  1. Sit up in a chair, with feet flat on the floor and back supported. Do not cross your ankles or legs. 2. Rest your left arm so that the cuff is about heart level. If the cuff goes on your upper arm,  then just relax the arm on the table, arm of the chair or your lap. If you have a wrist cuff, we  suggest relaxing your wrist against your chest (think of it as Pledging the Flag with the  wrong arm).  3. Place the cuff snugly around your arm, about 1 inch above the crook of your elbow. The  cords should be inside the groove of your elbow.  4. Sit quietly, with the cuff in place, for about 5 minutes. After  that 5 minutes press the power  button to start a reading. 5. Do not talk or move while the reading is taking place.  6. Record your readings on a sheet of paper. Although most cuffs have a memory, it is often  easier to see a pattern developing when the numbers are all in front of you.  7. You can repeat the reading after 1-3 minutes if it is recommended  Make sure your bladder is empty and you have not had caffeine or tobacco within the last 30 min  Always bring your blood pressure log with you to your appointments. If you have not brought your monitor in to be double checked for accuracy, please bring it to your next appointment.  You can find a list of quality blood pressure cuffs at validatebp.org

## 2023-02-19 NOTE — Progress Notes (Unsigned)
Office Visit    Patient Name: Krystal [name] Date of Encounter: 02/20/2023  Primary Care Provider:  Etta Grandchild, MD Primary Cardiologist:  Nanetta Batty, MD  Chief Complaint    Hypertension - Advanced hypertension clinic  Past Medical History   CAD 2003 DES to LCx on losartan, metoprolol, ASA  HLD 4/24 LDL 64 on rosuvastatin 10  CKD 10/23 GFR 43    Allergies  Allergen Reactions   Lipitor [Atorvastatin] Other (See Comments)    Myalgias    Zocor [Simvastatin] Other (See Comments)    Myalgias    Doxycycline     diarrhea   Fosamax [Alendronate] Other (See Comments)    Esophagitis   Levaquin [Levofloxacin] Diarrhea   Nexium [Esomeprazole] Nausea Only and Other (See Comments)    Stomach pains/burning sensation   Omnicef [Cefdinir] Diarrhea   Penicillins Hives   Pneumococcal Vaccine Other (See Comments)    Fatigue and aches that lasted 6+ months   Pneumovax [Pneumococcal Polysaccharide Vaccine]    Prilosec [Omeprazole] Nausea Only and Other (See Comments)    Stomach pains/burning sensation   Norvasc [Amlodipine] Swelling    Ankle edema   Shellfish Allergy Hives, Nausea Only and Rash   Shellfish-Derived Products Hives, Nausea Only and Rash    History of Present Illness    Krystal [name] is a 89 y.o. female patient who was referred to the Advanced Hypertension Clinic.  She has struggled with hypertension intermittently for 5-10 years. She saw Dr. Duke Salvia on 01/19/23 and her blood pressure was 176/78. Home readings were 130s-140s/60s-70s on average, sometimes lower or higher. At that time, we discontinued hydralazine, which was difficult to manage due to TID dosing and variable blood pressure readings. We added spironolactone 25 mg daily to her hypertension regimen. She has history of hyponatremia with hydrochlorothiazide and lower extremity edema with amlodipine. She saw Dr. Allyson Sabal on 02/04/23, and her blood pressure was 130/64. She saw Dr. Yetta Barre on 02/17/23, and her  blood pressure was 144/86. She had been experiencing some occasional nausea as well as some mucus production/wheezing in the evenings. She manages these symptoms with Zyrtec and an inhaler. For exercise, she walks 5 days a week 30 to 45 minutes at a time with her husband.  Today the patient is seen for hypertension follow-up. She has been checking her BP at home. Her home readings are averaging 131/77 in the morning and 131/73 in the evening. She has been adhering to her medication regimen. Since her last visit, she has been taking losartan once a day. She continues to take her walks for exercise. She has been eating fresh vegetables and fruits. For protein, she enjoys chicken and occasional beef. She mentioned that she is no longer taking escitalopram due to intolerance. Therefore, she continues having some anxiety and would like to try another medication for her anxiety.  Blood Pressure Goal:  130/80  Current Medications: losartan 50 mg every day , metoprolol succ 50 mg every day, spironolactone 25 mg qd  Previously tried:   amlodipine - LEE  Social Hx:     No alcohol use No drug use Does not smoke  Home BP readings:  last 2 weeks, highest reading 145 systolic  AM average 131/77  PM average 131/73  Accessory Clinical Findings    Lab Results  Component Value Date   CREATININE 1.20 (H) 01/21/2023   BUN 15 01/21/2023   NA 138 01/21/2023   K 4.9 01/21/2023   CL 102 01/21/2023  CO2 25 01/21/2023   Lab Results  Component Value Date   ALT 14 04/08/2022   AST 17 04/08/2022   ALKPHOS 84 04/08/2022   BILITOT 0.4 04/08/2022   Lab Results  Component Value Date   HGBA1C 6.0 07/03/2022    Screening for Secondary Hypertension: { Click here to document screening for secondary causes of HTN  :1}     01/14/2023    2:31 PM  Causes  Drugs/Herbals Screened     - Comments limits sodium.  no caffeine.  Rare EtOH.  Occasional NSAIDS  Sleep Apnea Screened    Relevant Labs/Studies:     Latest Ref Rng & Units 01/21/2023    8:27 AM 11/17/2022    9:06 AM 07/03/2022   10:57 AM  Basic Labs  Sodium 134 - 144 mmol/L 138  136  139   Potassium 3.5 - 5.2 mmol/L 4.9  4.5  4.5   Creatinine 0.57 - 1.00 mg/dL 1.30  8.65  7.84        Latest Ref Rng & Units 01/26/2022    9:50 PM 06/26/2021    3:29 PM  Thyroid   TSH 0.350 - 4.500 uIU/mL 2.260  1.61                   Home Medications    Current Outpatient Medications  Medication Sig Dispense Refill   sertraline (ZOLOFT) 25 MG tablet Take 1 tablet (25 mg total) by mouth daily. 30 tablet 5   acetaminophen (TYLENOL) 500 MG tablet Take 500 mg by mouth every 4 (four) hours as needed for mild pain or headache.     albuterol (VENTOLIN HFA) 108 (90 Base) MCG/ACT inhaler TAKE 2 PUFFS BY MOUTH EVERY 6 HOURS AS NEEDED FOR WHEEZE OR SHORTNESS OF BREATH 8.5 each 1   ALPRAZolam (XANAX) 0.5 MG tablet Take 1 tablet (0.5 mg total) by mouth at bedtime. 90 tablet 1   aspirin EC 81 MG tablet Take 81 mg by mouth daily.     Calcium Carbonate Antacid (CALCIUM CARBONATE PO) Take 1 tablet by mouth daily.     Cholecalciferol (VITAMIN D-3 PO) Take 1 capsule by mouth daily.     cycloSPORINE (RESTASIS) 0.05 % ophthalmic emulsion Place 1 drop into both eyes 2 (two) times daily.     fluticasone (FLONASE) 50 MCG/ACT nasal spray Place 1 spray into both nostrils daily. 48 mL 1   lansoprazole (PREVACID) 15 MG capsule Take 1 capsule (15 mg total) by mouth 2 (two) times daily before a meal. 180 capsule 3   losartan (COZAAR) 50 MG tablet TAKE 1 TABLET BY MOUTH TWICE A DAY 180 tablet 1   metoprolol succinate (TOPROL-XL) 50 MG 24 hr tablet Take 1 tablet (50 mg total) by mouth daily. Take with or immediately following a meal. 90 tablet 3   ondansetron (ZOFRAN-ODT) 4 MG disintegrating tablet Take 1 tablet (4 mg total) by mouth every 8 (eight) hours as needed for nausea or vomiting. 18 tablet 1   PREMARIN vaginal cream Place 1 Application vaginally See admin instructions.  Apply vaginally 2-3 times a week as directed     rosuvastatin (CRESTOR) 10 MG tablet Take 1 tablet (10 mg total) by mouth daily. 90 tablet 1   spironolactone (ALDACTONE) 25 MG tablet Take 1 tablet (25 mg total) by mouth daily. 90 tablet 3   Tavaborole (KERYDIN) 5 % SOLN Apply 1 drop topically daily. Apply 1 drop to the toenail daily. 10 mL 2   No current  facility-administered medications for this visit.     Assessment & Plan   HYPERTENSION CONTROL Vitals:   02/19/23 0839 02/19/23 0845  BP: (!) 162/89 (!) 153/88    The patient's blood pressure is elevated above target today. {Click here if intervention needs to be changed Refresh Note :1}  In order to address the patient's elevated BP: Blood pressure will be monitored at home to determine if medication changes need to be made.      Essential hypertension A: Goal <130/80. Her blood pressure in office was 153/88. Her home readings averaged 131/77 in the mornings and 131/73 in the evenings. Overall, her home readings seem to be controlled. Patient did mention that she gets white coat hypertension during provider visits. Some fluctuations in blood pressure could be due to anxiety. She tried Lexapro for anxiety but could not tolerate the nausea. We mentioned that we would reach out to her cardiologist for other treatment options for anxiety. Currently, no changes are being made to her medication regimen.  P:  Continue with all medications. The patient was instructed to continue checking her blood pressure at home. She will follow up with [place] in 6 months.  Buddy Duty PharmD Candidate [employer], Class of 2025  I was with patient and student for entire appointment and agree with above assessment and plan.  Reviewed chart with Dr. Duke Salvia , she is agreeable to trying sertraline 25 mg once daily for anxiety.  Will send prescription to pharmacy of choice.   Phillips Hay PharmD CPP [facility]   [address] [place], [place] [address] [phone]

## 2023-02-20 ENCOUNTER — Encounter (HOSPITAL_BASED_OUTPATIENT_CLINIC_OR_DEPARTMENT_OTHER): Payer: Self-pay | Admitting: Pharmacist Clinician (PhC)/ Clinical Pharmacy Specialist

## 2023-02-20 NOTE — Assessment & Plan Note (Signed)
A: Goal <130/80. Her blood pressure in office was 153/88. Her home readings averaged 131/77 in the mornings and 131/73 in the evenings. Overall, her home readings seem to be controlled. Patient did mention that she gets white coat hypertension during provider visits. Some fluctuations in blood pressure could be due to anxiety. She tried Lexapro for anxiety but could not tolerate the nausea. We mentioned that we would reach out to her cardiologist for other treatment options for anxiety. Currently, no changes are being made to her medication regimen.  P:  Continue with all medications. The patient was instructed to continue checking her blood pressure at home. She will follow up with [place] in 6 months.

## 2023-03-03 DIAGNOSIS — M5412 Radiculopathy, cervical region: Secondary | ICD-10-CM | POA: Diagnosis not present

## 2023-03-04 ENCOUNTER — Other Ambulatory Visit: Payer: Self-pay | Admitting: Internal Medicine

## 2023-03-04 DIAGNOSIS — I420 Dilated cardiomyopathy: Secondary | ICD-10-CM

## 2023-03-04 DIAGNOSIS — I2510 Atherosclerotic heart disease of native coronary artery without angina pectoris: Secondary | ICD-10-CM

## 2023-03-04 DIAGNOSIS — I10 Essential (primary) hypertension: Secondary | ICD-10-CM

## 2023-03-05 DIAGNOSIS — M5412 Radiculopathy, cervical region: Secondary | ICD-10-CM | POA: Diagnosis not present

## 2023-03-10 DIAGNOSIS — M5412 Radiculopathy, cervical region: Secondary | ICD-10-CM | POA: Diagnosis not present

## 2023-03-26 ENCOUNTER — Telehealth (HOSPITAL_BASED_OUTPATIENT_CLINIC_OR_DEPARTMENT_OTHER): Payer: Self-pay | Admitting: *Deleted

## 2023-03-26 NOTE — Telephone Encounter (Signed)
Patient was to have follow up labs for BMET, left detailed message (ok per DPR) needs to have done soon

## 2023-03-29 ENCOUNTER — Other Ambulatory Visit: Payer: Self-pay | Admitting: Internal Medicine

## 2023-03-30 ENCOUNTER — Other Ambulatory Visit: Payer: Self-pay

## 2023-03-30 DIAGNOSIS — M5412 Radiculopathy, cervical region: Secondary | ICD-10-CM | POA: Diagnosis not present

## 2023-03-30 DIAGNOSIS — M25511 Pain in right shoulder: Secondary | ICD-10-CM | POA: Diagnosis not present

## 2023-03-30 DIAGNOSIS — M542 Cervicalgia: Secondary | ICD-10-CM | POA: Diagnosis not present

## 2023-04-06 DIAGNOSIS — Z5181 Encounter for therapeutic drug level monitoring: Secondary | ICD-10-CM | POA: Diagnosis not present

## 2023-04-06 DIAGNOSIS — N289 Disorder of kidney and ureter, unspecified: Secondary | ICD-10-CM | POA: Diagnosis not present

## 2023-04-06 DIAGNOSIS — I10 Essential (primary) hypertension: Secondary | ICD-10-CM | POA: Diagnosis not present

## 2023-04-06 LAB — BASIC METABOLIC PANEL WITH GFR
BUN/Creatinine Ratio: 15 (ref 12–28)
BUN: 17 mg/dL (ref 10–36)
CO2: 24 mmol/L (ref 20–29)
Calcium: 9.1 mg/dL (ref 8.7–10.3)
Chloride: 104 mmol/L (ref 96–106)
Creatinine, Ser: 1.11 mg/dL — ABNORMAL HIGH (ref 0.57–1.00)
Glucose: 115 mg/dL — ABNORMAL HIGH (ref 70–99)
Potassium: 5.5 mmol/L — ABNORMAL HIGH (ref 3.5–5.2)
Sodium: 142 mmol/L (ref 134–144)
eGFR: 47 mL/min/1.73 — ABNORMAL LOW (ref >59)

## 2023-04-09 ENCOUNTER — Telehealth (HOSPITAL_BASED_OUTPATIENT_CLINIC_OR_DEPARTMENT_OTHER): Payer: Self-pay | Admitting: *Deleted

## 2023-04-09 DIAGNOSIS — Z5181 Encounter for therapeutic drug level monitoring: Secondary | ICD-10-CM

## 2023-04-09 DIAGNOSIS — E875 Hyperkalemia: Secondary | ICD-10-CM

## 2023-04-09 DIAGNOSIS — I10 Essential (primary) hypertension: Secondary | ICD-10-CM

## 2023-04-09 NOTE — Telephone Encounter (Signed)
 Advised daughter Vernona Rieger and released in [place]

## 2023-04-09 NOTE — Telephone Encounter (Signed)
-----   Message from [facility] sent at 04/08/2023  5:17 PM EST ----- Kidney function improving.  Potassium is a little high.  Reduce spironolactone to 12.5 mg.  Continue to track blood pressures and check a BMP in another couple weeks.

## 2023-04-15 DIAGNOSIS — M79601 Pain in right arm: Secondary | ICD-10-CM | POA: Diagnosis not present

## 2023-04-15 DIAGNOSIS — M542 Cervicalgia: Secondary | ICD-10-CM | POA: Diagnosis not present

## 2023-04-16 ENCOUNTER — Other Ambulatory Visit: Payer: Self-pay | Admitting: Internal Medicine

## 2023-04-16 DIAGNOSIS — F411 Generalized anxiety disorder: Secondary | ICD-10-CM

## 2023-04-21 DIAGNOSIS — M7541 Impingement syndrome of right shoulder: Secondary | ICD-10-CM | POA: Diagnosis not present

## 2023-04-21 DIAGNOSIS — M25511 Pain in right shoulder: Secondary | ICD-10-CM | POA: Diagnosis not present

## 2023-04-22 DIAGNOSIS — M7541 Impingement syndrome of right shoulder: Secondary | ICD-10-CM | POA: Insufficient documentation

## 2023-04-23 DIAGNOSIS — Z5181 Encounter for therapeutic drug level monitoring: Secondary | ICD-10-CM | POA: Diagnosis not present

## 2023-04-23 DIAGNOSIS — E875 Hyperkalemia: Secondary | ICD-10-CM | POA: Diagnosis not present

## 2023-04-23 DIAGNOSIS — I10 Essential (primary) hypertension: Secondary | ICD-10-CM | POA: Diagnosis not present

## 2023-04-23 LAB — BASIC METABOLIC PANEL
BUN/Creatinine Ratio: 20 (ref 12–28)
BUN: 23 mg/dL (ref 10–36)
CO2: 24 mmol/L (ref 20–29)
Calcium: 9.3 mg/dL (ref 8.7–10.3)
Chloride: 102 mmol/L (ref 96–106)
Creatinine, Ser: 1.14 mg/dL — ABNORMAL HIGH (ref 0.57–1.00)
Glucose: 83 mg/dL (ref 70–99)
Potassium: 4.8 mmol/L (ref 3.5–5.2)
Sodium: 140 mmol/L (ref 134–144)
eGFR: 45 mL/min/{1.73_m2} — ABNORMAL LOW (ref >59)

## 2023-04-28 DIAGNOSIS — M25511 Pain in right shoulder: Secondary | ICD-10-CM | POA: Diagnosis not present

## 2023-04-28 DIAGNOSIS — M5412 Radiculopathy, cervical region: Secondary | ICD-10-CM | POA: Diagnosis not present

## 2023-04-28 DIAGNOSIS — M542 Cervicalgia: Secondary | ICD-10-CM | POA: Diagnosis not present

## 2023-05-20 ENCOUNTER — Ambulatory Visit: Payer: Self-pay | Admitting: Internal Medicine

## 2023-05-20 NOTE — Telephone Encounter (Signed)
Spoke with daughter, states mom is not there.  Daughter states mom has a sinus infection and would like some medication called in.  No triage, daughter unaware of mom's symptoms.  Office notified to f/u with patient.

## 2023-05-20 NOTE — Telephone Encounter (Signed)
Unable to reach patients daughter. [place]

## 2023-05-20 NOTE — Telephone Encounter (Signed)
Patients daughter states that she thinks her mom over uses antibiotics. When she talked to her mom on the phone she didn't have any congestion. Patients daughter states that she will let her mom know that they will wait to see if she gets better and then they will schedule an appointment.

## 2023-06-17 ENCOUNTER — Telehealth: Payer: Self-pay | Admitting: Internal Medicine

## 2023-06-17 NOTE — Telephone Encounter (Signed)
 Copied from CRM [id number]. Topic: Clinical - Medication Question >> Jun 17, 2023 10:38 AM Krystal [name] wrote: Reason for CRM: Patient is wanting to speak to someone in regards to her Losartan - states she's been taking it once daily but the instructions state take twice daily, pharmacy told her to call her PCP for clarification.

## 2023-06-17 NOTE — Telephone Encounter (Signed)
 Patient's daughter Vernona Rieger called regarding the below call. Advised Losartan is prescribed BID on the Rx. She says that her mom went to the cardiologist in the fall and whatever the cardiologist told her that's what she's doing. Per the cardiology notes, patient mentioned taking losartan daily and he noted to continue her current medication regimen. Vernona Rieger would like to be called back to let her know what Dr. Yetta Barre would like her mom to do, so that it's not a discrepancy between what is being told to the mom. Advised I will send this to Dr. Yetta Barre for review.

## 2023-06-18 ENCOUNTER — Other Ambulatory Visit: Payer: Self-pay | Admitting: Internal Medicine

## 2023-06-18 NOTE — Telephone Encounter (Signed)
 How should she be taking her losartan ?

## 2023-06-24 ENCOUNTER — Other Ambulatory Visit: Payer: Self-pay | Admitting: Internal Medicine

## 2023-06-24 DIAGNOSIS — M5030 Other cervical disc degeneration, unspecified cervical region: Secondary | ICD-10-CM

## 2023-06-24 DIAGNOSIS — M542 Cervicalgia: Secondary | ICD-10-CM | POA: Diagnosis not present

## 2023-07-01 DIAGNOSIS — M7918 Myalgia, other site: Secondary | ICD-10-CM | POA: Insufficient documentation

## 2023-07-01 DIAGNOSIS — M5412 Radiculopathy, cervical region: Secondary | ICD-10-CM | POA: Diagnosis not present

## 2023-07-01 DIAGNOSIS — M791 Myalgia, unspecified site: Secondary | ICD-10-CM | POA: Diagnosis not present

## 2023-07-03 ENCOUNTER — Other Ambulatory Visit: Payer: Self-pay | Admitting: Internal Medicine

## 2023-07-03 DIAGNOSIS — K581 Irritable bowel syndrome with constipation: Secondary | ICD-10-CM

## 2023-07-24 ENCOUNTER — Ambulatory Visit

## 2023-07-30 DIAGNOSIS — Z1331 Encounter for screening for depression: Secondary | ICD-10-CM | POA: Diagnosis not present

## 2023-07-30 DIAGNOSIS — Z1231 Encounter for screening mammogram for malignant neoplasm of breast: Secondary | ICD-10-CM | POA: Diagnosis not present

## 2023-07-30 DIAGNOSIS — Z01419 Encounter for gynecological examination (general) (routine) without abnormal findings: Secondary | ICD-10-CM | POA: Diagnosis not present

## 2023-07-30 LAB — HM MAMMOGRAPHY

## 2023-08-03 ENCOUNTER — Encounter: Payer: Self-pay | Admitting: Obstetrics and Gynecology

## 2023-08-03 ENCOUNTER — Encounter (HOSPITAL_BASED_OUTPATIENT_CLINIC_OR_DEPARTMENT_OTHER): Payer: Self-pay | Admitting: Family

## 2023-08-03 ENCOUNTER — Ambulatory Visit (HOSPITAL_BASED_OUTPATIENT_CLINIC_OR_DEPARTMENT_OTHER): Payer: PPO | Admitting: Family

## 2023-08-03 VITALS — BP 150/78 | HR 65 | Ht 65.0 in | Wt 143.6 lb

## 2023-08-03 DIAGNOSIS — I10 Essential (primary) hypertension: Secondary | ICD-10-CM | POA: Diagnosis not present

## 2023-08-03 DIAGNOSIS — E785 Hyperlipidemia, unspecified: Secondary | ICD-10-CM | POA: Diagnosis not present

## 2023-08-03 DIAGNOSIS — I25118 Atherosclerotic heart disease of native coronary artery with other forms of angina pectoris: Secondary | ICD-10-CM | POA: Diagnosis not present

## 2023-08-03 MED ORDER — VALSARTAN 160 MG PO TABS: ORAL_TABLET | ORAL | 3 refills | Status: DC

## 2023-08-03 NOTE — Patient Instructions (Signed)
 Medication Instructions:  Stop Losartan  as directed Start Valsartan 160 mg in the evening  *If you need a refill on your cardiac medications before your next appointment, please call your pharmacy*  Lab Work: NONE ordered at this time of appointment    Testing/Procedures: NONE ordered at this time of appointment    Follow-Up: At [facility], you and your health needs are our priority.  As part of our continuing mission to provide you with exceptional heart care, our providers are all part of one team.  This team includes your primary Cardiologist (physician) and Advanced Practice Providers or APPs (Physician Assistants and Nurse Practitioners) who all work together to provide you with the care you need, when you need it.  Your next appointment:    3-4 months (Hypertension Clinic)  Provider:   Maudine Sos, MD, Neomi Banks, NP, or Donivan Furry, PharmD    We recommend signing up for the patient portal called "MyChart".  Sign up information is provided on this After Visit Summary.  MyChart is used to connect with patients for Virtual Visits (Telemedicine).  Patients are able to view lab/test results, encounter notes, upcoming appointments, etc.  Non-urgent messages can be sent to your provider as well.   To learn more about what you can do with MyChart, go to [email or web address].   Other Instructions

## 2023-08-03 NOTE — Progress Notes (Signed)
 Advanced Hypertension Clinic Assessment:    Date:  08/03/2023   ID:  Krystal [name], DOB 1929-08-06, MRN [id number]  PCP:  Arcadio Knuckles, MD  Cardiologist:  Lauro Portal, MD  Nephrologist:  Referring MD: Arcadio Knuckles, MD   CC: Hypertension  History of Present Illness:    Krystal [name] is a 89 y.o. female with a hx of CAD with prior DES to circumflex in 2003, HTN, HLD, GERD, CKD, NICM, anxiety here to follow up in the Advanced Hypertension Clinic.   She is nonischemic cardiomyopathy with LVEF 40% by echo 2014.  Established with Dr. Theodis Fiscal in Advanced Hypertension Clinic 01/14/23.  She had recently been seen in the ED with SBP greater than 200 for several days.  Due to blood pressure med difficult to control with variable readings at home.  Hydralazine  was stopped due to difficulty of 3 times daily dosing.  She was started on spironolactone  25 mg daily which was later reduced to 12.5 mg daily.  At visit with pharmacy team 02/19/2023 after discussion with Dr. Theodis Fiscal she was started on sertraline  for anxiety.  Presents today for follow-up independently.  Endorses feeling overall well since last seen.  Notes her blood pressure is often better controlled in the morning then in the evening.  Average SBP over last 10 readings in the morning 133 and in the evening 140.  She does not feel the losartan  is working as effectively as previous. Reports no shortness of breath nor dyspnea on exertion. Reports no chest pain, pressure, or tightness. No edema, orthopnea, PND. Reports no palpitations.  Continues to stay active by walking.    Previous antihypertensives: HCTZ-hyponatremia Amlodipine -edema  Past Medical History:  Diagnosis Date   Adenomatous colon polyp    Allergy    seasonal   Anemia    pt. denies   Anxiety    CAD (coronary artery disease)    Cataract    cataracts removed left eye.   Chronic lower back pain    Diverticulosis    GERD (gastroesophageal reflux disease)     Headache(784.0)    Hiatal hernia    Hyperlipidemia    Hypertension    IBS (irritable bowel syndrome) 10/13/2011   Osteoarthritis    Osteoporosis    Renal cysts, acquired, bilateral    SBO (small bowel obstruction) (HCC) 09/05/2015   Venous insufficiency     Past Surgical History:  Procedure Laterality Date   ABDOMINAL HYSTERECTOMY     BLADDER REPAIR     CARDIAC CATHETERIZATION  01/12/2002   90% stenosis of the left circumflex, stented with a 3x36mm Cypher stent, resulting in reduction of 90% to 10%    CARDIOVASCULAR STRESS TEST  03/22/2007   Mild inferolateral thinning toward the apex without significant ischemia. Nondiagnostic electrocardiogram.   CATARACT EXTRACTION     bilateral   COLONOSCOPY  08/17/2008   adenomatous polyp, diverticulosis, external hemorrhoids   COLONOSCOPY W/ BIOPSIES     multiple    LEFT LOWER EXTREMITY VENOUS DOPPLER  06/18/2011   No evidence of left lower extremity DVT   TRANSTHORACIC ECHOCARDIOGRAM  11/18/2012   EF 40-45%, LV systolic mild-moderately reduced, mild-moderate mitral valve regurg, mild-moderate tricuspid valve regurg. NSR-LBBB with occas PVCs   UPPER GASTROINTESTINAL ENDOSCOPY  07/03/2010   hiatal hernia   VARICOSE VEIN SURGERY      Current Medications: Current Meds  Medication Sig   acetaminophen  (TYLENOL ) 500 MG tablet Take 500 mg by mouth every 4 (four) hours as needed  for mild pain or headache.   albuterol  (VENTOLIN  HFA) 108 (90 Base) MCG/ACT inhaler TAKE 2 PUFFS BY MOUTH EVERY 6 HOURS AS NEEDED FOR WHEEZE OR SHORTNESS OF BREATH   ALPRAZolam  (XANAX ) 0.5 MG tablet TAKE 1 TABLET BY MOUTH AT BEDTIME.   aspirin  EC 81 MG tablet Take 81 mg by mouth daily.   Calcium  Carbonate Antacid (CALCIUM  CARBONATE PO) Take 1 tablet by mouth daily.   Cholecalciferol (VITAMIN D -3 PO) Take 1 capsule by mouth daily.   cycloSPORINE  (RESTASIS ) 0.05 % ophthalmic emulsion Place 1 drop into both eyes 2 (two) times daily.   fluticasone  (FLONASE ) 50 MCG/ACT nasal  spray Place 1 spray into both nostrils daily.   lansoprazole  (PREVACID ) 15 MG capsule TAKE 1 CAPSULE (15 MG TOTAL) BY MOUTH 2 (TWO) TIMES DAILY BEFORE A MEAL.   metoprolol  succinate (TOPROL -XL) 50 MG 24 hr tablet Take 1 tablet (50 mg total) by mouth daily. Take with or immediately following a meal.   ondansetron  (ZOFRAN -ODT) 4 MG disintegrating tablet TAKE 1 TABLET BY MOUTH EVERY 8 HOURS AS NEEDED FOR NAUSEA AND VOMITING   PREMARIN vaginal cream Place 1 Application vaginally See admin instructions. Apply vaginally 2-3 times a week as directed   rosuvastatin  (CRESTOR ) 10 MG tablet Take 1 tablet (10 mg total) by mouth daily.   sertraline  (ZOLOFT ) 25 MG tablet Take 1 tablet (25 mg total) by mouth daily.   spironolactone  (ALDACTONE ) 25 MG tablet Take 12.5 mg by mouth daily.   valsartan (DIOVAN) 160 MG tablet Take 1 tablet in the evening   [DISCONTINUED] losartan  (COZAAR ) 50 MG tablet TAKE 1 TABLET BY MOUTH TWICE A DAY     Allergies:   Lipitor [atorvastatin], Zocor  [simvastatin ], Doxycycline , Fosamax [alendronate], Levaquin [levofloxacin], Nexium [esomeprazole], Omnicef  [cefdinir ], Penicillins, Pneumococcal vaccine, Pneumovax [pneumococcal polysaccharide vaccine], Prilosec [omeprazole], Norvasc  [amlodipine ], Shellfish allergy, and Shellfish-derived products   Social History   Socioeconomic History   Marital status: Married    Spouse name: Sammie Crigler   Number of children: 2   Years of education: Not on file   Highest education level: Not on file  Occupational History   Occupation: retired  Tobacco Use   Smoking status: Never   Smokeless tobacco: Never  Vaping Use   Vaping status: Never Used  Substance and Sexual Activity   Alcohol use: No    Alcohol/week: 0.0 standard drinks of alcohol   Drug use: No   Sexual activity: Not on file  Other Topics Concern   Not on file  Social History Narrative   Married to [place] who has congestive heart failure problems   She is retired never smoker rare  alcohol   Social Drivers of Corporate investment banker Strain: Low Risk  (08/22/2022)   Overall Financial Resource Strain (CARDIA)    Difficulty of Paying Living Expenses: Not hard at all  Food Insecurity: No Food Insecurity (08/22/2022)   Hunger Vital Sign    Worried About Running Out of Food in the Last Year: Never true    Ran Out of Food in the Last Year: Never true  Transportation Needs: No Transportation Needs (08/22/2022)   PRAPARE - Administrator, Civil Service (Medical): No    Lack of Transportation (Non-Medical): No  Physical Activity: Insufficiently Active (08/22/2022)   Exercise Vital Sign    Days of Exercise per Week: 4 days    Minutes of Exercise per Session: 30 min  Stress: No Stress Concern Present (08/22/2022)   [employer] of Occupational Health -  Occupational Stress Questionnaire    Feeling of Stress : Not at all  Social Connections: Moderately Isolated (08/22/2022)   Social Connection and Isolation Panel [NHANES]    Frequency of Communication with Friends and Family: More than three times a week    Frequency of Social Gatherings with Friends and Family: Twice a week    Attends Religious Services: Never    Database administrator or Organizations: No    Attends Engineer, structural: Never    Marital Status: Married     Family History: The patient's family history includes Heart attack in her father; Heart disease in her father; Stroke in her sister. There is no history of Colon cancer.  ROS:   Please see the history of present illness.     All other systems reviewed and are negative.  EKGs/Labs/Other Studies Reviewed:         Recent Labs: 11/17/2022: Hemoglobin 13.4; Platelets 201 04/23/2023: BUN 23; Creatinine, Ser 1.14; Potassium 4.8; Sodium 140   Recent Lipid Panel    Component Value Date/Time   CHOL 134 07/03/2022 1057   CHOL 137 04/28/2017 0848   TRIG 107.0 07/03/2022 1057   HDL 48.40 07/03/2022 1057   HDL 44 04/28/2017  0848   CHOLHDL 3 07/03/2022 1057   VLDL 21.4 07/03/2022 1057   LDLCALC 64 07/03/2022 1057   LDLCALC 70 04/28/2017 0848   LDLDIRECT 160.4 12/19/2008 0815    Physical Exam:   VS:  BP (!) 150/78 (BP Location: Left Arm, Patient Position: Sitting)   Pulse 65   Ht 5\' 5"  (1.651 m)   Wt 143 lb 9.6 oz (65.1 kg)   SpO2 96%   BMI 23.90 kg/m  , BMI Body mass index is 23.9 kg/m. GENERAL:  Well appearing HEENT: Pupils equal round and reactive, fundi not visualized, oral mucosa unremarkable NECK:  No jugular venous distention, waveform within normal limits, carotid upstroke brisk and symmetric, no bruits, no thyromegaly LYMPHATICS:  No cervical adenopathy LUNGS:  Clear to auscultation bilaterally HEART:  RRR.  PMI not displaced or sustained,S1 and S2 within normal limits, no S3, no S4, no clicks, no rubs, no murmurs ABD:  Flat, positive bowel sounds normal in frequency in pitch, no bruits, no rebound, no guarding, no midline pulsatile mass, no hepatomegaly, no splenomegaly EXT:  2 plus pulses throughout, no edema, no cyanosis no clubbing SKIN:  No rashes no nodules NEURO:  Cranial nerves II through XII grossly intact, motor grossly intact throughout PSYCH:  Cognitively intact, oriented to person place and time   ASSESSMENT/PLAN:    HTN - BP controlled by morning readings but elevated in evening. Continue spironolactone  12 5 mg daily, Toprol  50 mg daily.  Will stop losartan  50 mg twice daily and instead start valsartan 160 mg every evening. Has annual visit with PCP Upcoming and anticipate renal function and potassium will be rechecked at that time.   HLD, LDL goal<70 - Continue Rosuvastatin  10mg  daily  CAD - prior DES 2003 to Cx. Stable with no anginal symptoms. No indication for ischemic evaluation.  GDMT aspirin81mg  daily, rosuvastatin  10mg  daily, toprol  50mg  daily. Recommend aiming for 150 minutes of moderate intensity activity per week and following a heart healthy diet.    NICM / HFrEF -  Euvolemic and well compensated on exam. GDMT Toprol  50mg  daily, Spironolactone  25mg  daily, valsartan 160mg  daily. As NYHA I will defer SGLT2i but could be considered in the future.   Screening for Secondary Hypertension:  01/14/2023    2:31 PM  Causes  Drugs/Herbals Screened     - Comments limits sodium.  no caffeine .  Rare EtOH.  Occasional NSAIDS  Sleep Apnea Screened    Relevant Labs/Studies:    Latest Ref Rng & Units 04/23/2023    9:32 AM 04/06/2023    2:13 PM 01/21/2023    8:27 AM  Basic Labs  Sodium 134 - 144 mmol/L 140  142  138   Potassium 3.5 - 5.2 mmol/L 4.8  5.5  4.9   Creatinine 0.57 - 1.00 mg/dL 8.29  5.62  1.30        Latest Ref Rng & Units 01/26/2022    9:50 PM 06/26/2021    3:29 PM  Thyroid    TSH 0.350 - 4.500 uIU/mL 2.260  1.61                    Disposition:    FU with MD/APP/PharmD in 3-4 months    Medication Adjustments/Labs and Tests Ordered: Current medicines are reviewed at length with the patient today.  Concerns regarding medicines are outlined above.  No orders of the defined types were placed in this encounter.  Meds ordered this encounter  Medications   valsartan (DIOVAN) 160 MG tablet    Sig: Take 1 tablet in the evening    Dispense:  90 tablet    Refill:  3     Signed, Clearnce Curia, NP  08/03/2023 4:02 PM    [facility]

## 2023-08-10 ENCOUNTER — Telehealth: Payer: Self-pay | Admitting: Family

## 2023-08-10 NOTE — Telephone Encounter (Signed)
 Pt c/o medication issue:  1. Name of Medication: valsartan  (DIOVAN ) 160 MG tablet   2. How are you currently taking this medication (dosage and times per day)? As written   3. Are you having a reaction (difficulty breathing--STAT)? No  4. What is your medication issue? Pt was told this would improve her BP and she doesn't think its helping. Please advise

## 2023-08-10 NOTE — Telephone Encounter (Signed)
 Left message to call back

## 2023-08-11 NOTE — Telephone Encounter (Signed)
 2nd call attempt, spoke with daughter (ok perDPR), she states she woke up one night and didn't feel well and took her blood pressure and it was high. Daughter states that she has stopped the valsartan  and resumed her original Losartan  medications. Pt would like to wait until her follow up to discuss. Advised we should move follow up sooner- daughter will speak with pt and call back for sooner follow up.   Daughter didn't know her current BP readings, she has been encouraging her mom to track them and write them down in her book. Daughter also advised the day that pt woke up with high BP she had a very stressful day with her husband in the ED- daughter states she tried to explain to pt that was likely the cause of the " reaction" not the medication.

## 2023-08-14 ENCOUNTER — Other Ambulatory Visit: Payer: Self-pay | Admitting: Internal Medicine

## 2023-08-14 DIAGNOSIS — E785 Hyperlipidemia, unspecified: Secondary | ICD-10-CM

## 2023-08-14 DIAGNOSIS — I2510 Atherosclerotic heart disease of native coronary artery without angina pectoris: Secondary | ICD-10-CM

## 2023-08-26 ENCOUNTER — Encounter: Admitting: Internal Medicine

## 2023-09-03 ENCOUNTER — Ambulatory Visit (HOSPITAL_BASED_OUTPATIENT_CLINIC_OR_DEPARTMENT_OTHER): Admitting: Family

## 2023-09-03 ENCOUNTER — Encounter (HOSPITAL_BASED_OUTPATIENT_CLINIC_OR_DEPARTMENT_OTHER): Payer: Self-pay | Admitting: Family

## 2023-09-03 VITALS — BP 140/70 | HR 64 | Ht 65.0 in | Wt 146.3 lb

## 2023-09-03 DIAGNOSIS — I10 Essential (primary) hypertension: Secondary | ICD-10-CM | POA: Diagnosis not present

## 2023-09-03 DIAGNOSIS — I25118 Atherosclerotic heart disease of native coronary artery with other forms of angina pectoris: Secondary | ICD-10-CM | POA: Diagnosis not present

## 2023-09-03 DIAGNOSIS — E785 Hyperlipidemia, unspecified: Secondary | ICD-10-CM | POA: Diagnosis not present

## 2023-09-03 DIAGNOSIS — I5022 Chronic systolic (congestive) heart failure: Secondary | ICD-10-CM | POA: Diagnosis not present

## 2023-09-03 NOTE — Progress Notes (Signed)
 [facility] Assessment:    Date:  09/03/2023   ID:  Krystal [name], DOB 09/05/1929, MRN [id number]  PCP:  Arcadio Knuckles, MD  Cardiologist:  Lauro Portal, MD  Nephrologist:  Referring MD: Arcadio Knuckles, MD   CC: Hypertension  History of Present Illness:    Krystal [name] is a 89 y.o. female with a hx of CAD with prior DES to circumflex in 2003, HTN, HLD, GERD, CKD, NICM, anxiety here to follow up in the [facility].   She is nonischemic cardiomyopathy with LVEF 40% by echo 2014.  Established with Dr. Theodis Fiscal in [facility] 01/14/23.  She had recently been seen in the ED with SBP greater than 200 for several days.  Due to blood pressure med difficult to control with variable readings at home.  Hydralazine  was stopped due to difficulty of 3 times daily dosing.  She was started on spironolactone  25 mg daily which was later reduced to 12.5 mg daily due to hyperkalemia.  At visit with pharmacy team 02/19/2023 after discussion with Dr. Theodis Fiscal she was started on sertraline  for anxiety.  At visit 08/03/2023 losartan  stopped and transition to valsartan .  Presents today for follow-up independently.  Reports trouble with her blood pressure. She is taking her Valsartan  in the morning along with her spironolactone  and Toprol .  She feels it helps some days but it does not help other days. She does continue to follow a low sodium diet. She checks her blood pressure upon waking.  Discussed blood pressure readings would likely be higher prior to medications and improved throughout the day.  She typically checks her BP twice daily.  Her average BP in the morning was 134/76 with average afternoon blood pressure 131/78.  SBP range 120-140s.  Reports no chest pain, near-syncope, syncope, lightheadedness, dizziness.  She does note recent stressors as unfortunately her husband was recently diagnosed with bladder cancer, condolences offered.  Previous  antihypertensives: HCTZ-hyponatremia Amlodipine -edema  Past Medical History:  Diagnosis Date   Adenomatous colon polyp    Allergy    seasonal   Anemia    pt. denies   Anxiety    CAD (coronary artery disease)    Cataract    cataracts removed left eye.   Chronic lower back pain    Diverticulosis    GERD (gastroesophageal reflux disease)    Headache(784.0)    Hiatal hernia    Hyperlipidemia    Hypertension    IBS (irritable bowel syndrome) 10/13/2011   Osteoarthritis    Osteoporosis    Renal cysts, acquired, bilateral    SBO (small bowel obstruction) (HCC) 09/05/2015   Venous insufficiency     Past Surgical History:  Procedure Laterality Date   ABDOMINAL HYSTERECTOMY     BLADDER REPAIR     CARDIAC CATHETERIZATION  01/12/2002   90% stenosis of the left circumflex, stented with a 3x46mm Cypher stent, resulting in reduction of 90% to 10%    CARDIOVASCULAR STRESS TEST  03/22/2007   Mild inferolateral thinning toward the apex without significant ischemia. Nondiagnostic electrocardiogram.   CATARACT EXTRACTION     bilateral   COLONOSCOPY  08/17/2008   adenomatous polyp, diverticulosis, external hemorrhoids   COLONOSCOPY W/ BIOPSIES     multiple    LEFT LOWER EXTREMITY VENOUS DOPPLER  06/18/2011   No evidence of left lower extremity DVT   TRANSTHORACIC ECHOCARDIOGRAM  11/18/2012   EF 40-45%, LV systolic mild-moderately reduced, mild-moderate mitral valve regurg, mild-moderate tricuspid valve regurg.  NSR-LBBB with occas PVCs   UPPER GASTROINTESTINAL ENDOSCOPY  07/03/2010   hiatal hernia   VARICOSE VEIN SURGERY      Current Medications: Current Meds  Medication Sig   acetaminophen  (TYLENOL ) 500 MG tablet Take 500 mg by mouth every 4 (four) hours as needed for mild pain or headache.   albuterol  (VENTOLIN  HFA) 108 (90 Base) MCG/ACT inhaler TAKE 2 PUFFS BY MOUTH EVERY 6 HOURS AS NEEDED FOR WHEEZE OR SHORTNESS OF BREATH   aspirin  EC 81 MG tablet Take 81 mg by mouth daily.   Calcium   Carbonate Antacid (CALCIUM  CARBONATE PO) Take 1 tablet by mouth daily.   Cholecalciferol (VITAMIN D -3 PO) Take 1 capsule by mouth daily.   cycloSPORINE  (RESTASIS ) 0.05 % ophthalmic emulsion Place 1 drop into both eyes 2 (two) times daily.   fluticasone  (FLONASE ) 50 MCG/ACT nasal spray Place 1 spray into both nostrils daily.   lansoprazole  (PREVACID ) 15 MG capsule TAKE 1 CAPSULE (15 MG TOTAL) BY MOUTH 2 (TWO) TIMES DAILY BEFORE A MEAL.   metoprolol  succinate (TOPROL -XL) 50 MG 24 hr tablet Take 1 tablet (50 mg total) by mouth daily. Take with or immediately following a meal.   ondansetron  (ZOFRAN -ODT) 4 MG disintegrating tablet TAKE 1 TABLET BY MOUTH EVERY 8 HOURS AS NEEDED FOR NAUSEA AND VOMITING   PREMARIN vaginal cream Place 1 Application vaginally See admin instructions. Apply vaginally 2-3 times a week as directed   rosuvastatin  (CRESTOR ) 10 MG tablet TAKE 1 TABLET BY MOUTH EVERY DAY   sertraline  (ZOLOFT ) 25 MG tablet Take 1 tablet (25 mg total) by mouth daily.   spironolactone  (ALDACTONE ) 25 MG tablet Take 12.5 mg by mouth daily.   valsartan  (DIOVAN ) 160 MG tablet Take 1 tablet in the evening     Allergies:   Lipitor [atorvastatin], Zocor  [simvastatin ], Doxycycline , Fosamax [alendronate], Levaquin [levofloxacin], Nexium [esomeprazole], Omnicef  [cefdinir ], Penicillins, Pneumococcal vaccine, Pneumovax [pneumococcal polysaccharide vaccine], Prilosec [omeprazole], Norvasc  [amlodipine ], Shellfish allergy, and Shellfish-derived products   Social History   Socioeconomic History   Marital status: Married    Spouse name: Krystal [name]   Number of children: 2   Years of education: Not on file   Highest education level: Not on file  Occupational History   Occupation: retired  Tobacco Use   Smoking status: Never   Smokeless tobacco: Never  Vaping Use   Vaping status: Never Used  Substance and Sexual Activity   Alcohol use: No    Alcohol/week: 0.0 standard drinks of alcohol   Drug use: No    Sexual activity: Not on file  Other Topics Concern   Not on file  Social History Narrative   Married to [place] who has congestive heart failure problems   She is retired never smoker rare alcohol   Social Drivers of Corporate investment banker Strain: Low Risk  (08/22/2022)   Overall Financial Resource Strain (CARDIA)    Difficulty of Paying Living Expenses: Not hard at all  Food Insecurity: No Food Insecurity (08/22/2022)   Hunger Vital Sign    Worried About Running Out of Food in the Last Year: Never true    Ran Out of Food in the Last Year: Never true  Transportation Needs: No Transportation Needs (08/22/2022)   PRAPARE - Administrator, Civil Service (Medical): No    Lack of Transportation (Non-Medical): No  Physical Activity: Insufficiently Active (08/22/2022)   Exercise Vital Sign    Days of Exercise per Week: 4 days    Minutes of Exercise  per Session: 30 min  Stress: No Stress Concern Present (08/22/2022)   [employer] of Occupational Health - Occupational Stress Questionnaire    Feeling of Stress : Not at all  Social Connections: Moderately Isolated (08/22/2022)   Social Connection and Isolation Panel [NHANES]    Frequency of Communication with Friends and Family: More than three times a week    Frequency of Social Gatherings with Friends and Family: Twice a week    Attends Religious Services: Never    Database administrator or Organizations: No    Attends Engineer, structural: Never    Marital Status: Married     Family History: The patient's family history includes Heart attack in her father; Heart disease in her father; Stroke in her sister. There is no history of Colon cancer.  ROS:   Please see the history of present illness.     All other systems reviewed and are negative.  EKGs/Labs/Other Studies Reviewed:         Recent Labs: 11/17/2022: Hemoglobin 13.4; Platelets 201 04/23/2023: BUN 23; Creatinine, Ser 1.14; Potassium 4.8; Sodium 140    Recent Lipid Panel    Component Value Date/Time   CHOL 134 07/03/2022 1057   CHOL 137 04/28/2017 0848   TRIG 107.0 07/03/2022 1057   HDL 48.40 07/03/2022 1057   HDL 44 04/28/2017 0848   CHOLHDL 3 07/03/2022 1057   VLDL 21.4 07/03/2022 1057   LDLCALC 64 07/03/2022 1057   LDLCALC 70 04/28/2017 0848   LDLDIRECT 160.4 12/19/2008 0815    Physical Exam:   VS:  BP (!) 144/70 (BP Location: Left Arm, Patient Position: Sitting, Cuff Size: Normal)   Pulse 64   Ht 5\' 5"  (1.651 m)   Wt 146 lb 4.8 oz (66.4 kg)   SpO2 96%   BMI 24.35 kg/m  , BMI Body mass index is 24.35 kg/m.  Vitals:   09/03/23 1453 09/03/23 1525  BP: (!) 144/70 (!) 140/70  Pulse: 64   Height: 5\' 5"  (1.651 m)   Weight: 146 lb 4.8 oz (66.4 kg)   SpO2: 96%   BMI (Calculated): 24.35     GENERAL:  Well appearing HEENT: Pupils equal round and reactive, fundi not visualized, oral mucosa unremarkable NECK:  No jugular venous distention, waveform within normal limits, carotid upstroke brisk and symmetric, no bruits, no thyromegaly LYMPHATICS:  No cervical adenopathy LUNGS:  Clear to auscultation bilaterally HEART:  RRR.  PMI not displaced or sustained,S1 and S2 within normal limits, no S3, no S4, no clicks, no rubs, no murmurs ABD:  Flat, positive bowel sounds normal in frequency in pitch, no bruits, no rebound, no guarding, no midline pulsatile mass, no hepatomegaly, no splenomegaly EXT:  2 plus pulses throughout, no edema, no cyanosis no clubbing SKIN:  No rashes no nodules NEURO:  Cranial nerves II through XII grossly intact, motor grossly intact throughout PSYCH:  Cognitively intact, oriented to person place and time   ASSESSMENT/PLAN:    HTN -known element of whitecoat hypertension.   Her average BP in the morning was 134/76 with average afternoon blood pressure 131/78.  SBP range 120-140s. Continue spironolactone  12 5 mg daily, Toprol  50 mg daily in the morning. Move Valsartan  160mg  to evening dosing.   HLD,  LDL goal<70 - Continue Rosuvastatin  10mg  daily  CAD - prior DES 2003 to Cx. Stable with no anginal symptoms. No indication for ischemic evaluation.  GDMT aspirin81mg  daily, rosuvastatin  10mg  daily, toprol  50mg  daily. Recommend aiming for 150 minutes  of moderate intensity activity per week and following a heart healthy diet.    NICM / HFrEF - Euvolemic and well compensated on exam. GDMT Toprol  50mg  daily, Spironolactone  12.5mg  daily, valsartan  160mg  daily. As NYHA I will defer SGLT2i but could be considered in the future.   Screening for Secondary Hypertension:     01/14/2023    2:31 PM  Causes  Drugs/Herbals Screened     - Comments limits sodium.  no caffeine .  Rare EtOH.  Occasional NSAIDS  Sleep Apnea Screened    Relevant Labs/Studies:    Latest Ref Rng & Units 04/23/2023    9:32 AM 04/06/2023    2:13 PM 01/21/2023    8:27 AM  Basic Labs  Sodium 134 - 144 mmol/L 140  142  138   Potassium 3.5 - 5.2 mmol/L 4.8  5.5  4.9   Creatinine 0.57 - 1.00 mg/dL 1.61  0.96  0.45        Latest Ref Rng & Units 01/26/2022    9:50 PM 06/26/2021    3:29 PM  Thyroid    TSH 0.350 - 4.500 uIU/mL 2.260  1.61                    Disposition:    FU with MD/APP/PharmD as scheduled in August   Medication Adjustments/Labs and Tests Ordered: Current medicines are reviewed at length with the patient today.  Concerns regarding medicines are outlined above.  No orders of the defined types were placed in this encounter.  No orders of the defined types were placed in this encounter.    Signed, Clearnce Curia, NP  09/03/2023 3:13 PM    [facility]

## 2023-09-03 NOTE — Patient Instructions (Addendum)
 Medication Instructions:   Let's move your Valsartan  to the evening  Tonight: take HALF tablet of Valsartan   Tomorrow morning: take NO Valsartan . Continue Metoprol 50mg  and Spironolactone  12.5mg  (half tablet) in the morning  Tomorrow night: take WHOLE tablet of Valsartan    Follow-Up: Please follow up as scheduled with Clearnce Curia, NP    Special Instructions:   Keep up the great work checking your blood pressure at home.

## 2023-09-11 ENCOUNTER — Ambulatory Visit (HOSPITAL_BASED_OUTPATIENT_CLINIC_OR_DEPARTMENT_OTHER): Payer: PPO | Admitting: Cardiovascular Disease

## 2023-09-16 ENCOUNTER — Ambulatory Visit: Admitting: Internal Medicine

## 2023-09-16 ENCOUNTER — Ambulatory Visit: Payer: Self-pay | Admitting: Internal Medicine

## 2023-09-16 ENCOUNTER — Encounter: Payer: Self-pay | Admitting: Internal Medicine

## 2023-09-16 VITALS — BP 166/84 | HR 56 | Temp 97.8°F | Ht 65.0 in | Wt 148.0 lb

## 2023-09-16 DIAGNOSIS — N1831 Chronic kidney disease, stage 3a: Secondary | ICD-10-CM | POA: Diagnosis not present

## 2023-09-16 DIAGNOSIS — I5022 Chronic systolic (congestive) heart failure: Secondary | ICD-10-CM

## 2023-09-16 DIAGNOSIS — R001 Bradycardia, unspecified: Secondary | ICD-10-CM | POA: Diagnosis not present

## 2023-09-16 DIAGNOSIS — K219 Gastro-esophageal reflux disease without esophagitis: Secondary | ICD-10-CM | POA: Diagnosis not present

## 2023-09-16 DIAGNOSIS — E785 Hyperlipidemia, unspecified: Secondary | ICD-10-CM

## 2023-09-16 DIAGNOSIS — I10 Essential (primary) hypertension: Secondary | ICD-10-CM

## 2023-09-16 DIAGNOSIS — Z0000 Encounter for general adult medical examination without abnormal findings: Secondary | ICD-10-CM | POA: Diagnosis not present

## 2023-09-16 DIAGNOSIS — Z0001 Encounter for general adult medical examination with abnormal findings: Secondary | ICD-10-CM

## 2023-09-16 DIAGNOSIS — R7303 Prediabetes: Secondary | ICD-10-CM

## 2023-09-16 LAB — URINALYSIS, ROUTINE W REFLEX MICROSCOPIC
Bilirubin Urine: NEGATIVE
Hgb urine dipstick: NEGATIVE
Ketones, ur: NEGATIVE
Leukocytes,Ua: NEGATIVE
Nitrite: NEGATIVE
RBC / HPF: NONE SEEN (ref 0–?)
Specific Gravity, Urine: 1.01 (ref 1.000–1.030)
Total Protein, Urine: NEGATIVE
Urine Glucose: NEGATIVE
Urobilinogen, UA: 0.2 (ref 0.0–1.0)
WBC, UA: NONE SEEN (ref 0–?)
pH: 7 (ref 5.0–8.0)

## 2023-09-16 LAB — BASIC METABOLIC PANEL WITH GFR
BUN: 16 mg/dL (ref 6–23)
CO2: 32 meq/L (ref 19–32)
Calcium: 9 mg/dL (ref 8.4–10.5)
Chloride: 100 meq/L (ref 96–112)
Creatinine, Ser: 0.93 mg/dL (ref 0.40–1.20)
GFR: 53.68 mL/min — ABNORMAL LOW (ref >60.00)
Glucose, Bld: 96 mg/dL (ref 70–99)
Potassium: 4.3 meq/L (ref 3.5–5.1)
Sodium: 135 meq/L (ref 135–145)

## 2023-09-16 LAB — LIPID PANEL
Cholesterol: 128 mg/dL (ref 0–200)
HDL: 47.9 mg/dL (ref >39.00)
LDL Cholesterol: 64 mg/dL (ref 0–99)
NonHDL: 79.65
Total CHOL/HDL Ratio: 3
Triglycerides: 80 mg/dL (ref 0.0–149.0)
VLDL: 16 mg/dL (ref 0.0–40.0)

## 2023-09-16 LAB — HEMOGLOBIN A1C: Hgb A1c MFr Bld: 6.1 % (ref 4.6–6.5)

## 2023-09-16 LAB — HEPATIC FUNCTION PANEL
ALT: 13 U/L (ref 0–35)
AST: 21 U/L (ref 0–37)
Albumin: 4.1 g/dL (ref 3.5–5.2)
Alkaline Phosphatase: 88 U/L (ref 39–117)
Bilirubin, Direct: 0.1 mg/dL (ref 0.0–0.3)
Total Bilirubin: 0.5 mg/dL (ref 0.2–1.2)
Total Protein: 6.6 g/dL (ref 6.0–8.3)

## 2023-09-16 LAB — TSH: TSH: 1.17 u[IU]/mL (ref 0.35–5.50)

## 2023-09-16 LAB — CBC WITH DIFFERENTIAL/PLATELET
Basophils Absolute: 0 10*3/uL (ref 0.0–0.1)
Basophils Relative: 0.6 % (ref 0.0–3.0)
Eosinophils Absolute: 0.2 10*3/uL (ref 0.0–0.7)
Eosinophils Relative: 2.2 % (ref 0.0–5.0)
HCT: 40.7 % (ref 36.0–46.0)
Hemoglobin: 13.4 g/dL (ref 12.0–15.0)
Lymphocytes Relative: 27.4 % (ref 12.0–46.0)
Lymphs Abs: 2.1 10*3/uL (ref 0.7–4.0)
MCHC: 33 g/dL (ref 30.0–36.0)
MCV: 94.1 fl (ref 78.0–100.0)
Monocytes Absolute: 0.6 10*3/uL (ref 0.1–1.0)
Monocytes Relative: 8.1 % (ref 3.0–12.0)
Neutro Abs: 4.7 10*3/uL (ref 1.4–7.7)
Neutrophils Relative %: 61.7 % (ref 43.0–77.0)
Platelets: 214 10*3/uL (ref 150.0–400.0)
RBC: 4.33 Mil/uL (ref 3.87–5.11)
RDW: 14.3 % (ref 11.5–15.5)
WBC: 7.6 10*3/uL (ref 4.0–10.5)

## 2023-09-16 LAB — CK: Total CK: 91 U/L (ref 7–177)

## 2023-09-16 NOTE — Patient Instructions (Signed)

## 2023-09-16 NOTE — Progress Notes (Unsigned)
 Subjective:  Patient ID: Krystal [name], female    DOB: 1929/09/03  Age: 89 y.o. MRN: [id number]  CC: Annual Exam (No concerns. )   HPI AMBERLIE GAILLARD presents for a CPX and f/up -----  Discussed the use of AI scribe software for clinical note transcription with the patient, who gave verbal consent to proceed.  History of Present Illness   Krystal [name] is a 89 year old female who presents with sinus problems and low heart rate.  She has ongoing sinus problems characterized by significant drainage. She takes Allegra daily and uses Flonase  to manage her symptoms. No headaches or blurred vision are present.  She denies feeling weak, dizzy, lightheaded, or experiencing palpitations. She was unaware of her low heart rate prior to this visit. She maintains an active lifestyle, performing housework, cooking, driving, and walking without difficulty.  She experiences nerve pain in her legs, particularly in the back of her hips and buttocks, which worsens after prolonged sitting but does not affect her ability to walk.  She takes Xanax  to aid her sleep at night.  No chest pain, shortness of breath, irregular heartbeats, dizziness, lightheadedness, fatigue, or recent episodes of feeling faint. Her legs are not swollen.       ROS Review of Systems  Constitutional:  Negative for appetite change, chills, diaphoresis and fatigue.  HENT: Negative.    Eyes:  Negative for visual disturbance.  Respiratory: Negative.  Negative for chest tightness, shortness of breath and wheezing.   Cardiovascular:  Negative for chest pain, palpitations and leg swelling.  Gastrointestinal:  Negative for abdominal pain, constipation, diarrhea, nausea and vomiting.  Genitourinary: Negative.   Musculoskeletal: Negative.  Negative for arthralgias and myalgias.  Skin: Negative.   Neurological:  Negative for dizziness and weakness.  Hematological:  Negative for adenopathy. Does not bruise/bleed easily.   Psychiatric/Behavioral:  Positive for confusion, decreased concentration and sleep disturbance. The patient is nervous/anxious.     Objective:  BP (!) 166/84 (BP Location: Left Arm, Patient Position: Sitting, Cuff Size: Normal)   Pulse (!) 56   Temp 97.8 F (36.6 C) (Oral)   Ht 5' 5 (1.651 m)   Wt 148 lb (67.1 kg)   SpO2 98%   BMI 24.63 kg/m   BP Readings from Last 3 Encounters:  09/16/23 (!) 166/84  09/03/23 (!) 140/70  08/03/23 (!) 150/78    Wt Readings from Last 3 Encounters:  09/16/23 148 lb (67.1 kg)  09/03/23 146 lb 4.8 oz (66.4 kg)  08/03/23 143 lb 9.6 oz (65.1 kg)    Physical Exam Vitals reviewed.  Constitutional:      Appearance: Normal appearance.  HENT:     Nose: Nose normal.     Mouth/Throat:     Mouth: Mucous membranes are moist.   Eyes:     General: No scleral icterus.    Conjunctiva/sclera: Conjunctivae normal.    Cardiovascular:     Rate and Rhythm: Regular rhythm. Bradycardia present.     Heart sounds: No murmur heard.    No gallop.     Comments: EKG-- SB (new), 59 bpm LBBB - unchanged No Q waves Pulmonary:     Effort: Pulmonary effort is normal.     Breath sounds: No stridor. No wheezing, rhonchi or rales.  Abdominal:     General: Abdomen is flat.     Palpations: There is no mass.     Tenderness: There is no abdominal tenderness. There is no guarding.  Hernia: No hernia is present.   Musculoskeletal:     Cervical back: Neck supple.     Right lower leg: No edema.     Left lower leg: No edema.  Lymphadenopathy:     Cervical: No cervical adenopathy.   Skin:    General: Skin is warm and dry.   Neurological:     General: No focal deficit present.     Mental Status: She is alert.   Psychiatric:        Mood and Affect: Mood normal.     Lab Results  Component Value Date   WBC 7.6 09/16/2023   HGB 13.4 09/16/2023   HCT 40.7 09/16/2023   PLT 214.0 09/16/2023   GLUCOSE 96 09/16/2023   CHOL 128 09/16/2023   TRIG 80.0  09/16/2023   HDL 47.90 09/16/2023   LDLDIRECT 160.4 12/19/2008   LDLCALC 64 09/16/2023   ALT 13 09/16/2023   AST 21 09/16/2023   NA 135 09/16/2023   K 4.3 09/16/2023   CL 100 09/16/2023   CREATININE 0.93 09/16/2023   BUN 16 09/16/2023   CO2 32 09/16/2023   TSH 1.17 09/16/2023   HGBA1C 6.1 09/16/2023    CT Head Wo Contrast Result Date: 11/17/2022 CLINICAL DATA:  Provided history: Headache, new onset. Additional history provided: Recent left eye infection. Headache. Nausea. EXAM: CT HEAD WITHOUT CONTRAST TECHNIQUE: Contiguous axial images were obtained from the base of the skull through the vertex without intravenous contrast. RADIATION DOSE REDUCTION: This exam was performed according to the departmental dose-optimization program which includes automated exposure control, adjustment of the mA and/or kV according to patient size and/or use of iterative reconstruction technique. COMPARISON:  None. FINDINGS: Brain: No age advanced or lobar predominant parenchymal atrophy. Patchy and ill-defined hypoattenuation within the cerebral white matter, nonspecific but compatible with mild chronic small vessel ischemic disease. 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 There is no acute intracranial hemorrhage. No demarcated cortical infarct. No extra-axial fluid collection. No evidence of an intracranial mass. No midline shift. Vascular: No hyperdense vessel.  Atherosclerotic calcifications. Skull: No calvarial fracture or aggressive osseous lesion. Sinuses/Orbits: No mass or acute finding within the imaged orbits. No significant paranasal sinus disease at the imaged levels. IMPRESSION: 1. No evidence of an acute intracranial abnormality on this non-contrast exam. 2. Mild chronic small vessel ischemic changes within the cerebral white matter. 3. 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 Electronically Signed   By: Bascom Lily D.O.   On: 11/17/2022 10:57   DG Chest 2  View Result Date: 11/17/2022 CLINICAL DATA:  Hypertension. Headache. Denies vision or chest pain EXAM: CHEST - 2 VIEW COMPARISON:  CXR 04/08/22 FINDINGS: No pleural effusion. No pneumothorax. No focal airspace opacity. Normal cardiac and mediastinal contours. No radiographically apparent displaced rib fractures. Visualized upper abdomen is unremarkable. Vertebral body heights are unchanged compared to prior exam. IMPRESSION: No focal airspace opacity Electronically Signed   By: Clora Dane M.D.   On: 11/17/2022 10:00    Assessment & Plan:  Bradycardia -     EKG 12-Lead -     TSH; Future  Hypertension, unspecified type- BP is adequately well controlled. -     EKG 12-Lead  Hyperlipidemia with target LDL less than 70- LDL goal achieved. Doing well on the statin  -     Lipid panel; Future -     TSH; Future -     CK; Future -     Hepatic function panel;  Future  Essential hypertension -     Basic metabolic panel with GFR; Future -     CBC with Differential/Platelet; Future -     TSH; Future -     Urinalysis, Routine w reflex microscopic; Future  Gastroesophageal reflux disease without esophagitis -     CBC with Differential/Platelet; Future  Prediabetes -     Hemoglobin A1c; Future  Chronic systolic CHF (congestive heart failure) (HCC)- No signs of fluid excess.  Encounter for general adult medical examination with abnormal - Exam completed, labs reviewed, vaccines reviewed, no cancer screenings indicated, pt ed material was given.   Stage 3a chronic kidney disease (HCC)- Will avoid nephrotoxic agents      Follow-up: Return in about 6 months (around 03/17/2024).  Sandra Crouch, MD

## 2023-09-17 ENCOUNTER — Telehealth (HOSPITAL_BASED_OUTPATIENT_CLINIC_OR_DEPARTMENT_OTHER): Payer: Self-pay

## 2023-09-17 NOTE — Telephone Encounter (Signed)
 Attempted to call pt- no answer & unable to leave VM at this time- will also send [facility] message. Left message with pharmacy as well.

## 2023-09-17 NOTE — Telephone Encounter (Signed)
 Received the following secure chat from PCP CMA,   Me and Dr. Rochelle Chu seen one of your patients yesterday her MRN:  [id number]. I was going over her medication with her and she was under the impression that she's supposed to take Losartan  and Valsartan . I advised her that you discontinued the Losartan  08/03/2023. She became very irritable with me and stated that she's been taking that medication forever and you never told her to stop. Well her heart rate was a little lower than normal yesterday. Is there anyway that somebody from your office can get her an appointment with the pharmacist or you to go over her medications? She wouldn't listen to us  for anything because you are the cardiologist.    Per office visit 08/03/23- Stop Losartan , start Valsartan  160mg    Pt should be taking ASA 81mg  daily  Metoprolol  Succinate 50mg  daily  Sprio 12.5mg  daily  Valsartan  160mg  daily (picked up 5/5 90 day supply @[facility]  )

## 2023-09-18 NOTE — Telephone Encounter (Signed)
Patient reviewed mychart message

## 2023-09-23 IMAGING — DX DG CHEST 1V PORT
1 series · 1 of 1 positions shown · non-contrast
Comparison: 09/17/2016

CLINICAL DATA: Cough.  Nausea and vomiting.

EXAM:
PORTABLE CHEST 1 VIEW

[chest ap]
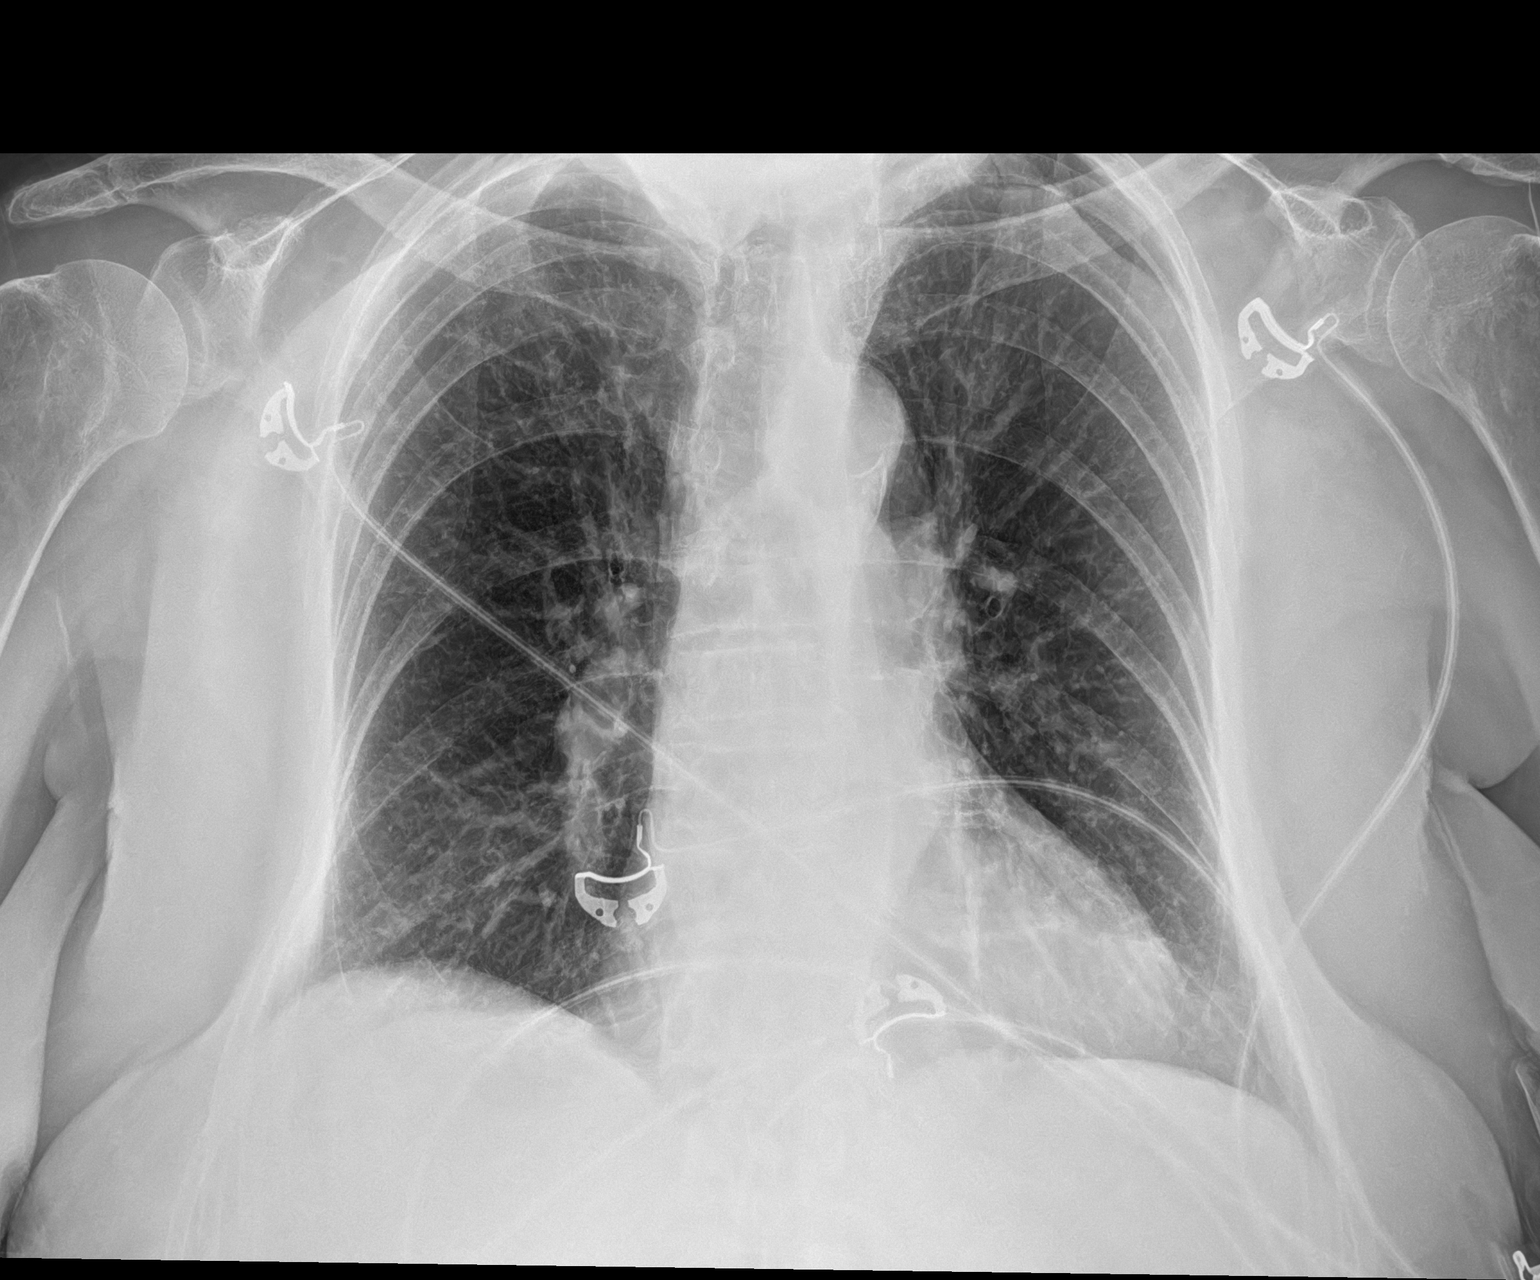

[1 of 1 positions shown; findings below may reference images not displayed]

FINDINGS: Normal cardiomediastinal contours. Aortic atherosclerotic
calcifications. No pleural effusion or edema identified. No airspace
opacities.
IMPRESSION: No acute cardiopulmonary abnormalities.

Aortic Atherosclerosis ([id number]).

## 2023-09-23 IMAGING — CT CT ABD-PELV W/ CM
2 of 5 series · 16 of 46 positions shown, 18 images · IV contrast (APPLIED)
Comparison: 09/06/2015 plain film.  CT 09/05/2015

CLINICAL DATA: Intermittent nausea and epigastric pain since
[REDACTED]. Hypertension. Irritable bowel syndrome. Small-bowel
obstruction.

EXAM:
CT ABDOMEN AND PELVIS WITH CONTRAST
TECHNIQUE: Multidetector CT imaging of the abdomen and pelvis was performed
using the standard protocol following bolus administration of
intravenous contrast.

[Series 2: abd pel w · axial · 0.70mm/px · z∈[-462,-92]mm · 13 of 84 slices shown, 15 images]
[im 5/84  soft-tissue]
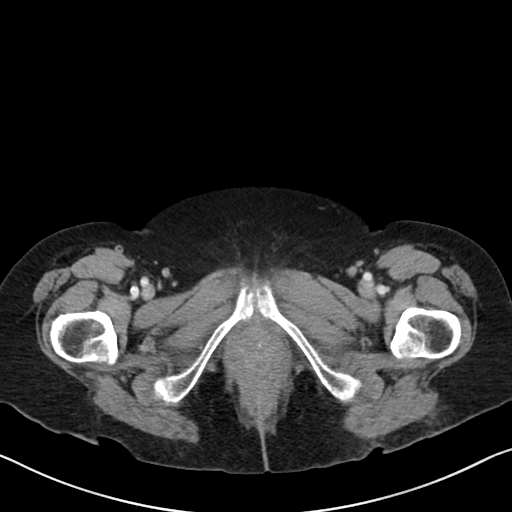
[im 5/84  bone]
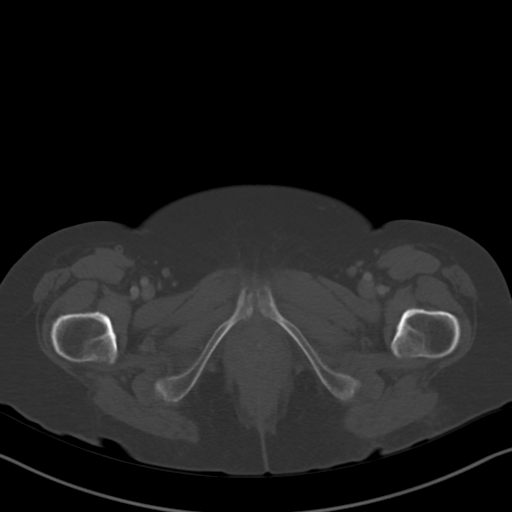
[im 10/84  soft-tissue]
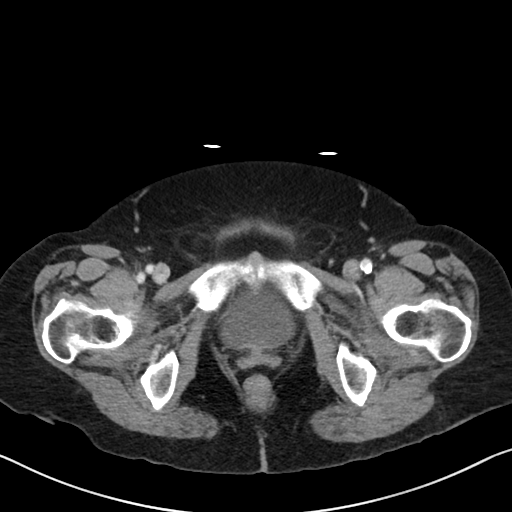
[im 20/84  soft-tissue]
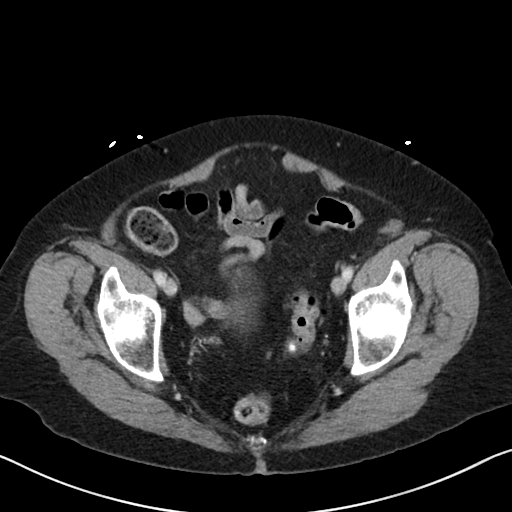
[im 25/84  soft-tissue]
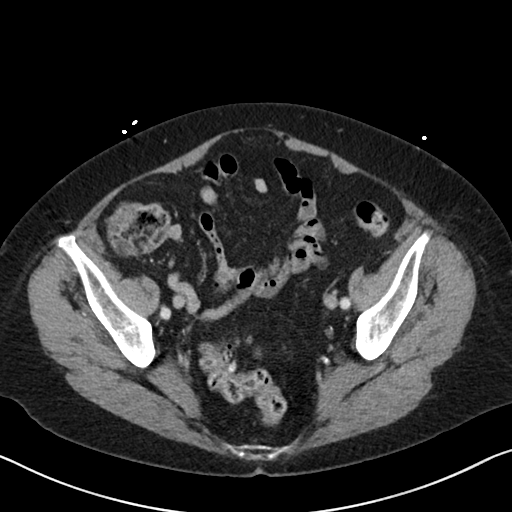
[im 30/84  soft-tissue]
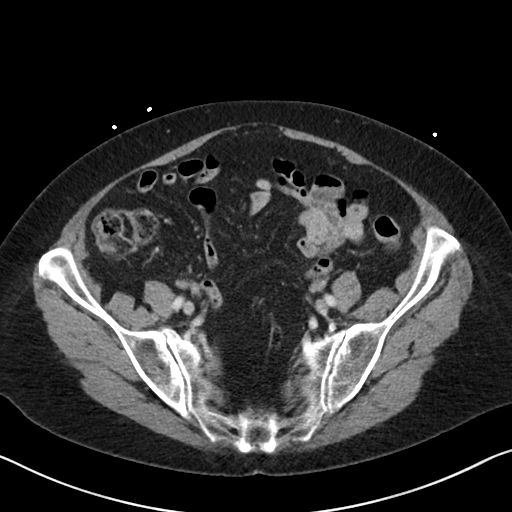
[im 35/84  soft-tissue]
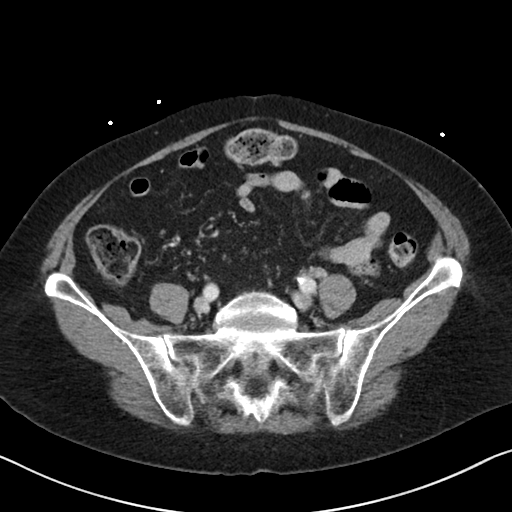
[im 44/84  soft-tissue]
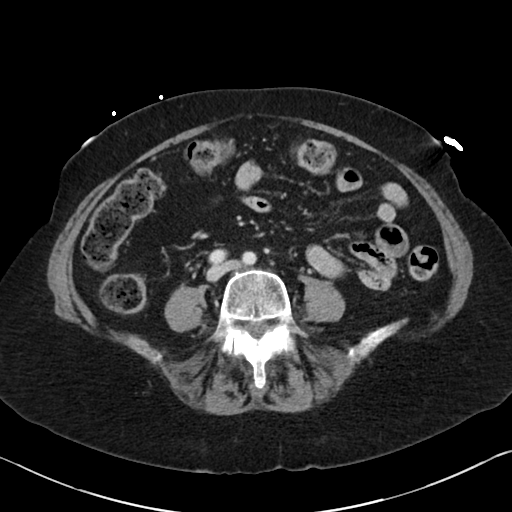
[im 49/84  soft-tissue]
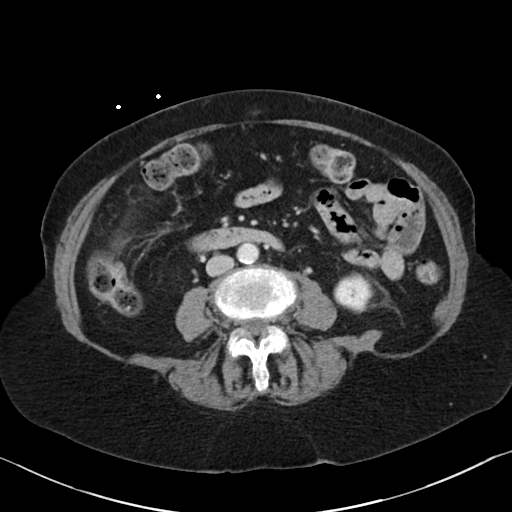
[im 54/84  soft-tissue]
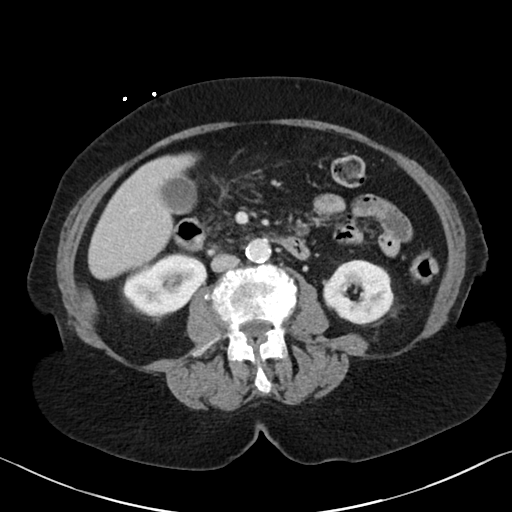
[im 54/84  bone]
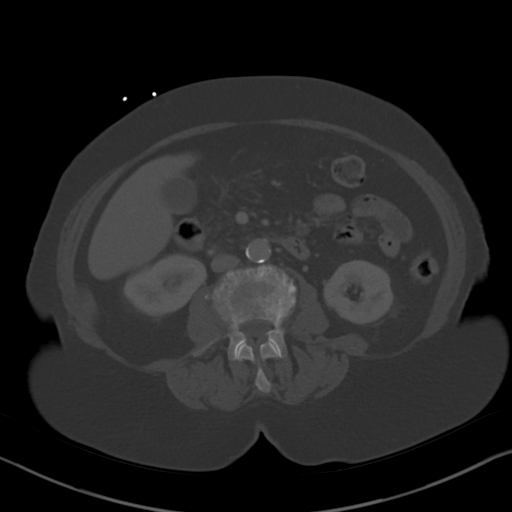
[im 59/84  soft-tissue]
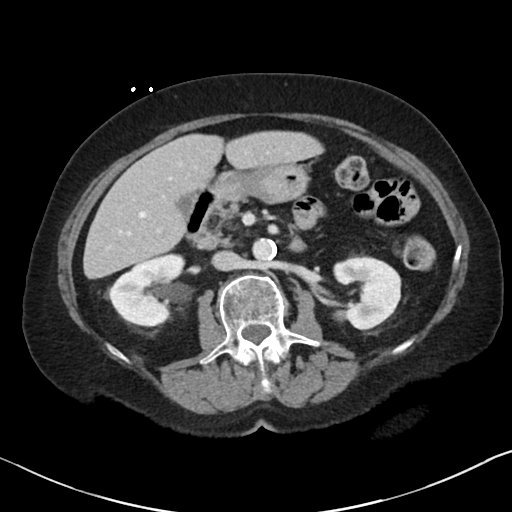
[im 64/84  soft-tissue]
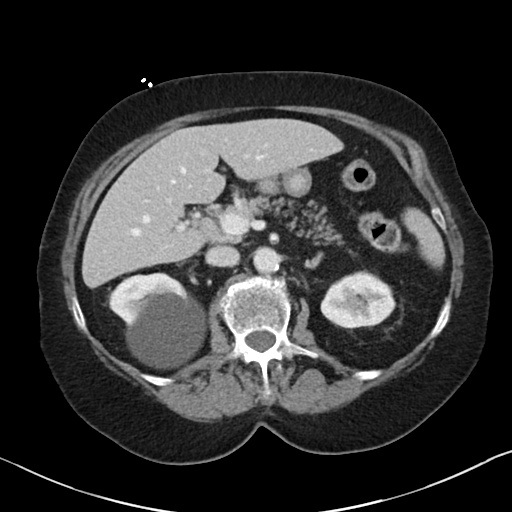
[im 74/84  soft-tissue]
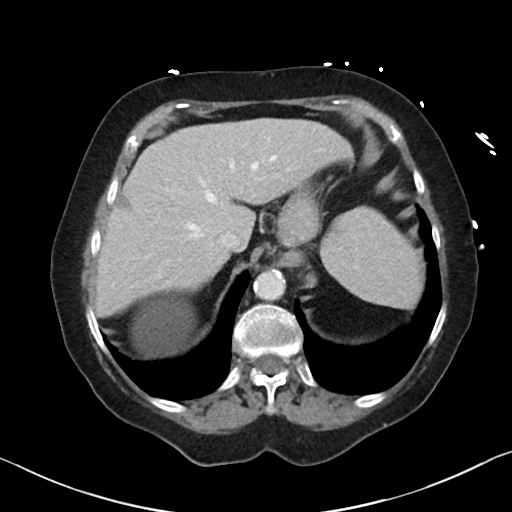
[im 79/84  soft-tissue]
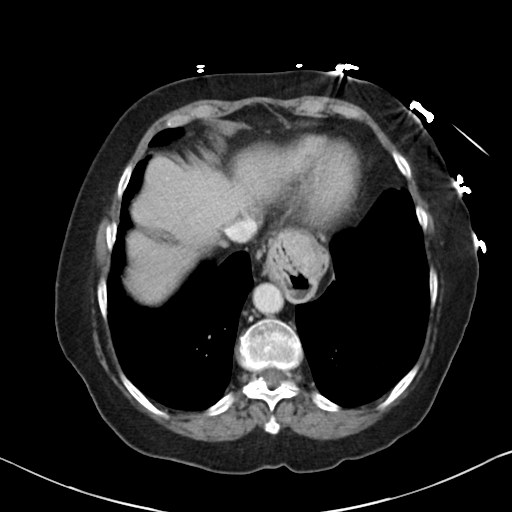

[Series 5: coronal · coronal · 0.75mm/px · 3 of 96 slices shown]
[im 32/96  soft-tissue]
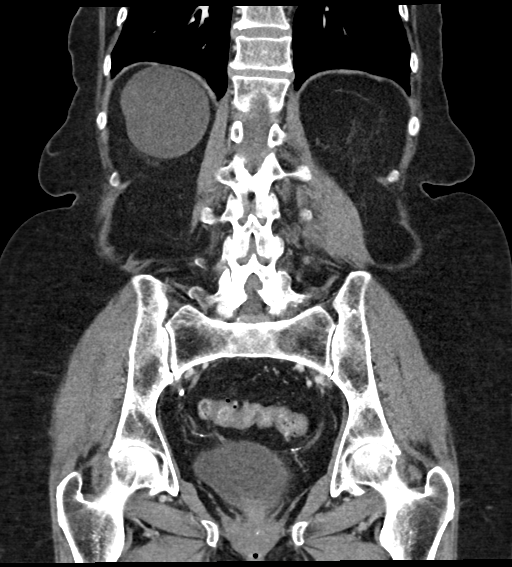
[im 43/96  soft-tissue]
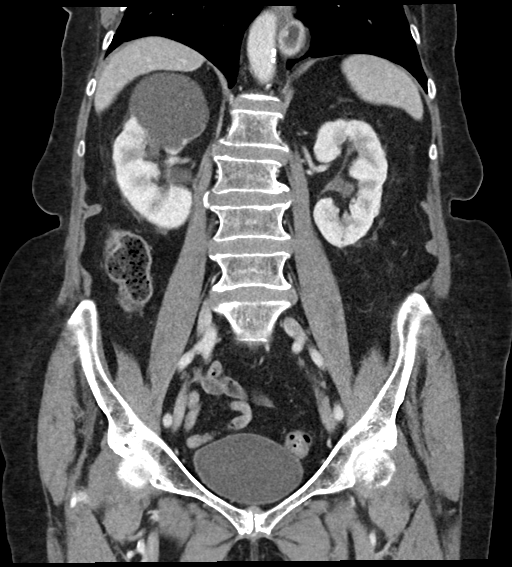
[im 53/96  soft-tissue]
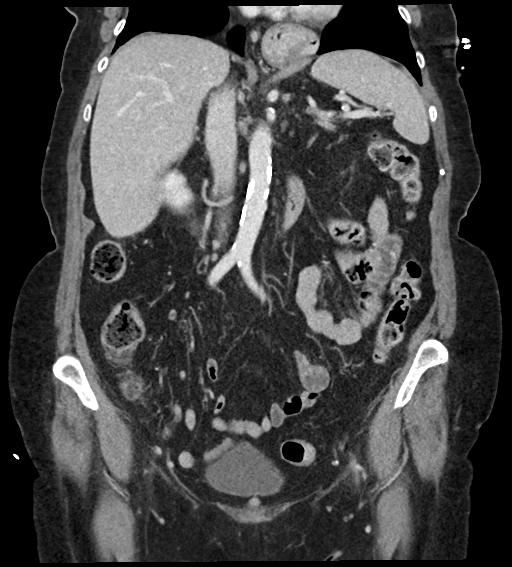

[16 of 46 positions shown; findings below may reference images not displayed]

RADIATION DOSE REDUCTION: This exam was performed according to the
departmental dose-optimization program which includes automated
exposure control, adjustment of the mA and/or kV according to
patient size and/or use of iterative reconstruction technique.

CONTRAST:  80mL OMNIPAQUE IOHEXOL 300 MG/ML  SOLN
FINDINGS: Lower chest: Subpleural 4 mm left lower lobe pulmonary nodule is
unchanged on [DATE] and considered benign. Normal heart size without
pericardial or pleural effusion. Small hiatal hernia, slightly
increased.

Hepatobiliary: Tiny right hepatic lobe low-density lesion is likely
a cyst. Normal gallbladder, without biliary ductal dilatation.

Pancreas: Similar appearance of fatty replacement involving the
pancreatic body, tail, uncinate process. No duct dilatation or acute
inflammation.

Spleen: Normal in size, without focal abnormality.

Adrenals/Urinary Tract: Normal adrenal glands. Upper pole right
renal 6.1 cm fluid density lesion is most consistent with a cyst .
In the absence of clinically indicated signs/symptoms require(s) no
independent follow-up. Normal left kidney, without hydronephrosis.
Normal urinary bladder.

Stomach/Bowel: Decompressed remainder of the stomach. Scattered
colonic diverticula. Normal terminal ileum and appendix. Normal
small bowel.

Vascular/Lymphatic: Aortic atherosclerosis. Retroaortic left renal
vein. Significant atherosclerosis of the proximal SMA. No
abdominopelvic adenopathy.

Reproductive: Hysterectomy.  No adnexal mass.

Other: No significant free fluid. Mild pelvic floor laxity. No free
intraperitoneal air.

Musculoskeletal: Disc bulges including at the lumbosacral junction,
L4-5 level.
IMPRESSION: 1. No acute process in the abdomen or pelvis.
2. Small hiatal hernia, slightly increased.
3. Aortic Atherosclerosis ([id number].P).

## 2023-09-27 ENCOUNTER — Other Ambulatory Visit: Payer: Self-pay | Admitting: Internal Medicine

## 2023-09-27 DIAGNOSIS — M5030 Other cervical disc degeneration, unspecified cervical region: Secondary | ICD-10-CM

## 2023-09-28 ENCOUNTER — Other Ambulatory Visit (HOSPITAL_BASED_OUTPATIENT_CLINIC_OR_DEPARTMENT_OTHER): Payer: Self-pay

## 2023-09-28 MED ORDER — SERTRALINE HCL 25 MG PO TABS: 25.0000 mg | ORAL_TABLET | Freq: Every day | ORAL | 3 refills | Status: AC

## 2023-10-02 ENCOUNTER — Other Ambulatory Visit: Payer: Self-pay | Admitting: Internal Medicine

## 2023-10-02 DIAGNOSIS — M5030 Other cervical disc degeneration, unspecified cervical region: Secondary | ICD-10-CM

## 2023-10-07 DIAGNOSIS — M7541 Impingement syndrome of right shoulder: Secondary | ICD-10-CM | POA: Diagnosis not present

## 2023-10-19 ENCOUNTER — Telehealth: Payer: Self-pay | Admitting: Family

## 2023-10-19 NOTE — Telephone Encounter (Signed)
 Left message to call back

## 2023-10-19 NOTE — Telephone Encounter (Signed)
 Pt c/o medication issue:  1. Name of Medication:   valsartan  (DIOVAN ) 160 MG tablet    2. How are you currently taking this medication (dosage and times per day)? As written   3. Are you having a reaction (difficulty breathing--STAT)? No   4. What is your medication issue? Pt called in stating her BP is okay during day, but in the evenings and morning, its not. She states she takes this med at night but it doesn't bring her bp down. She states her bp in the mornings is around 170's/90's.  She states she is also having cramps and night sweats and she wasn't sure if these symptoms were related to this medication or not. Please advise.

## 2023-10-20 NOTE — Telephone Encounter (Signed)
 Please assist

## 2023-10-20 NOTE — Telephone Encounter (Signed)
 Night sweats atypical for cardiac cause, would recommend follow up with PCP. Please remind her of her appt upcoming 11/05/23. Recommend she bring pill bottles to that visit. We can check labs at that time to ensure no electrolyte abnormalities causing cramps.   Khyri Hinzman S Evelen Vazguez, NP

## 2023-10-21 NOTE — Telephone Encounter (Signed)
 Left message for pt to call.

## 2023-10-22 NOTE — Telephone Encounter (Signed)
 Spoke to patient's daughter (on [place]). Pt's daughter advised that pt has been having leg cramping and noticing night sweats. Reports that she is unsure if patient is staying hydrated well or not. Daughter reports that cramping started last week and this week she is sick with upper respiratory symptoms. Reports that elevated BP readings are before Valsartan  is taken. States that patient has been on Valsartan  for about a year. Advised that I am unaware that symptoms being experiencing are side effects. Pt has PCP appointment tomorrow due to cold symptoms. Advised to keep appointment with PCP and try to stay hydrated. Please advise if there are further recommendations.

## 2023-10-23 ENCOUNTER — Encounter: Payer: Self-pay | Admitting: Family Medicine

## 2023-10-23 ENCOUNTER — Ambulatory Visit: Admitting: Family Medicine

## 2023-10-23 VITALS — BP 128/82 | HR 64 | Temp 98.7°F | Ht 65.0 in | Wt 141.6 lb

## 2023-10-23 DIAGNOSIS — B9689 Other specified bacterial agents as the cause of diseases classified elsewhere: Secondary | ICD-10-CM | POA: Diagnosis not present

## 2023-10-23 DIAGNOSIS — J329 Chronic sinusitis, unspecified: Secondary | ICD-10-CM

## 2023-10-23 DIAGNOSIS — F5101 Primary insomnia: Secondary | ICD-10-CM

## 2023-10-23 MED ORDER — TRAZODONE HCL 50 MG PO TABS
25.0000 mg | ORAL_TABLET | Freq: Every day | ORAL | 0 refills | Status: DC
Start: 2023-10-23 — End: 2023-11-16

## 2023-10-23 MED ORDER — AZITHROMYCIN 250 MG PO TABS: ORAL_TABLET | ORAL | 0 refills | Status: AC

## 2023-10-23 MED ORDER — PREDNISONE 20 MG PO TABS
20.0000 mg | ORAL_TABLET | Freq: Every day | ORAL | 0 refills | Status: AC
Start: 2023-10-23 — End: 2023-10-28

## 2023-10-23 NOTE — Telephone Encounter (Signed)
 Noted. Will follow up as scheduled. Due to multiple symptoms likely related to URI and multiple medication concerns best addressed in clinic visit.   Gregory Dowe S Faren Florence, NP

## 2023-10-23 NOTE — Telephone Encounter (Signed)
 Spoke with Daughter per DPR. States her mother said last night after she spoke with our office that she had stopped the Valsartan  last week with the leg cramps and went back to the old medication(losartan ?).  Pt is going to PCP today and has appointment 8/7 with Reche Finder, NP.  Stated understanding of taking BP for a week and will request she brings those to her appointment.

## 2023-10-23 NOTE — Telephone Encounter (Signed)
 Agree that leg cramps, night sweats unlikely related to Valsartan  and more likely due to acute illness. Recommend follow up wit PCP as scheduled. Recommend checking BP at least an hour after medications for one week and reporting back readings prior to making  any medication changes. Want to carefully avoid hypotension (low BP).   Krystal [name] S Krystal Krenn, NP

## 2023-10-23 NOTE — Progress Notes (Signed)
 Acute Office Visit  Subjective:     Patient ID: Krystal [name], female    DOB: 10-Mar-1930, 89 y.o.   MRN: [id number]  No chief complaint on file.   HPI  Discussed the use of AI scribe software for clinical note transcription with the patient, who gave verbal consent to proceed.  History of Present Illness Krystal [name] is a 89 year old female who presents with headache, cough, and mucus drainage.  Headache and upper respiratory symptoms - Headache located behind the eyes and in the head - Mucus drainage in the throat - Hoarse voice - No ear pain - No fever - Allegra and Zyrtec were ineffective for symptom relief - Mucinex  caused nausea - Not currently taking medication for respiratory symptoms - Unable to tolerate penicillin, cefdinir , doxycycline , and Levaquin  Sleep disturbance - Difficulty sleeping - Takes Xanax  for sleep but dislikes it - Granddaughter suggested trazodone as an alternative  Nocturnal leg cramps - Experiences leg cramps at night     ROS Per HPI      Objective:    BP 128/82 (BP Location: Left Arm, Patient Position: Sitting, Cuff Size: Normal)   Pulse 64   Temp 98.7 F (37.1 C) (Temporal)   Ht 5' 5 (1.651 m)   Wt 141 lb 9.6 oz (64.2 kg)   SpO2 95%   BMI 23.56 kg/m    Physical Exam Vitals and nursing note reviewed.  Constitutional:      General: She is not in acute distress.    Appearance: Normal appearance. She is normal weight.  HENT:     Head: Normocephalic and atraumatic.     Right Ear: External ear normal.     Left Ear: External ear normal.     Nose: Congestion present.     Right Sinus: Maxillary sinus tenderness and frontal sinus tenderness present.     Left Sinus: Maxillary sinus tenderness and frontal sinus tenderness present.     Mouth/Throat:     Mouth: Mucous membranes are moist.     Pharynx: Oropharynx is clear.     Comments: Oropharyngeal cobblestoning   Eyes:     Extraocular Movements: Extraocular movements  intact.     Pupils: Pupils are equal, round, and reactive to light.  Neck:     Vascular: No carotid bruit.  Cardiovascular:     Rate and Rhythm: Normal rate and regular rhythm.     Pulses: Normal pulses.     Heart sounds: Normal heart sounds.  Pulmonary:     Effort: Pulmonary effort is normal. No respiratory distress.     Breath sounds: Normal breath sounds. No wheezing, rhonchi or rales.     Comments: Mild cough in office  Musculoskeletal:        General: Normal range of motion.     Cervical back: Normal range of motion.     Right lower leg: No edema.     Left lower leg: No edema.  Lymphadenopathy:     Cervical: No cervical adenopathy.  Skin:    General: Skin is warm and dry.  Neurological:     General: No focal deficit present.     Mental Status: She is alert and oriented to person, place, and time.  Psychiatric:        Mood and Affect: Mood normal.        Thought Content: Thought content normal.     No results found for any visits on 10/23/23.      Assessment &  Plan:   Assessment and Plan Assessment & Plan Sinusitis Symptoms suggest sinusitis with persistent headache, cough, mucus drainage, and hoarseness. No fever. Allergies noted, no relief from Allegra, Zyrtec, and Mucinex . Sinus infection suspected. Azithromycin  and prednisone  considered for treatment. - Prescribe azithromycin . - Prescribe prednisone .  Insomnia Difficulty sleeping with unsatisfactory results from Xanax . Trazodone considered as a milder alternative. Reports nocturnal leg cramps. - Discontinue Xanax . - Prescribe trazodone. - Advise to take trazodone at bedtime and go to bed immediately after. - Have Festus call to confirm medication review.     No orders of the defined types were placed in this encounter.    Meds ordered this encounter  Medications   azithromycin  (ZITHROMAX ) 250 MG tablet    Sig: Take 2 tablets on day 1, then 1 tablet daily on days 2 through 5    Dispense:  6 tablet     Refill:  0   predniSONE  (DELTASONE ) 20 MG tablet    Sig: Take 1 tablet (20 mg total) by mouth daily with breakfast for 5 days.    Dispense:  5 tablet    Refill:  0   traZODone (DESYREL) 50 MG tablet    Sig: Take 0.5 tablets (25 mg total) by mouth at bedtime.    Dispense:  15 tablet    Refill:  0    Return in about 4 weeks (around 11/20/2023) for meds.  Corean LITTIE Ku, FNP

## 2023-10-23 NOTE — Patient Instructions (Signed)
 I have sent in azithromycin  for you to take.  Take 2 tablets today, then 1 tablet daily for the next 4 days.  I have sent in prednisone  for you to take 1 tablet once daily in the morning with breakfast for the next 5 days.  I have sent in trazodone 50mg  in for you to take 1/2 tablet at bedtime.  STOP xanax   Follow up with me in one month for evaluation of medication effectiveness.

## 2023-11-02 ENCOUNTER — Other Ambulatory Visit: Payer: Self-pay | Admitting: Internal Medicine

## 2023-11-02 DIAGNOSIS — M5030 Other cervical disc degeneration, unspecified cervical region: Secondary | ICD-10-CM

## 2023-11-05 ENCOUNTER — Ambulatory Visit (HOSPITAL_BASED_OUTPATIENT_CLINIC_OR_DEPARTMENT_OTHER): Admitting: Family

## 2023-11-05 ENCOUNTER — Encounter (HOSPITAL_BASED_OUTPATIENT_CLINIC_OR_DEPARTMENT_OTHER): Payer: Self-pay | Admitting: Family

## 2023-11-05 VITALS — BP 162/90 | HR 60 | Ht 65.0 in | Wt 144.3 lb

## 2023-11-05 DIAGNOSIS — I2510 Atherosclerotic heart disease of native coronary artery without angina pectoris: Secondary | ICD-10-CM | POA: Diagnosis not present

## 2023-11-05 DIAGNOSIS — E785 Hyperlipidemia, unspecified: Secondary | ICD-10-CM | POA: Diagnosis not present

## 2023-11-05 DIAGNOSIS — I10 Essential (primary) hypertension: Secondary | ICD-10-CM | POA: Diagnosis not present

## 2023-11-05 DIAGNOSIS — Z9861 Coronary angioplasty status: Secondary | ICD-10-CM

## 2023-11-05 MED ORDER — LOSARTAN POTASSIUM 50 MG PO TABS: 50.0000 mg | ORAL_TABLET | Freq: Two times a day (BID) | ORAL | 1 refills | Status: DC

## 2023-11-05 MED ORDER — SPIRONOLACTONE 25 MG PO TABS: 12.5000 mg | ORAL_TABLET | Freq: Every day | ORAL | 1 refills | Status: DC

## 2023-11-05 MED ORDER — METOPROLOL SUCCINATE ER 50 MG PO TB24: 50.0000 mg | ORAL_TABLET | Freq: Every day | ORAL | 3 refills | Status: AC

## 2023-11-05 NOTE — Progress Notes (Signed)
 [facility] Assessment:    Date:  11/05/2023   ID:  Krystal [name], DOB [birth year], MRN [id number]  PCP:  Joshua Debby LITTIE, MD  Cardiologist:  Dorn Lesches, MD  Nephrologist:  Referring MD: Joshua Debby LITTIE, MD   CC: Hypertension  History of Present Illness:    Krystal [name] is a 89 y.o. female with a hx of CAD with prior DES to circumflex in 2003, HTN, HLD, GERD, CKD, NICM, anxiety here to follow up in the [facility].   She is nonischemic cardiomyopathy with LVEF 40% by echo 2014.  Established with Dr. Raford in [facility] 01/14/23.  She had recently been seen in the ED with SBP greater than 200 for several days.  Due to blood pressure med difficult to control with variable readings at home.  Hydralazine  was stopped due to difficulty of 3 times daily dosing.  She was started on spironolactone  25 mg daily which was later reduced to 12.5 mg daily due to hyperkalemia.  At visit with pharmacy team 02/19/2023 after discussion with Dr. Raford she was started on sertraline  for anxiety.  At visit 08/03/2023 losartan  stopped and transition to valsartan .  Last seen 09/03/23 her average BP in the morning was 134/76 with average afternoon blood pressure 131/78.  SBP range 120-140s.  Her Valsartan  was moved to evening dosing. She was again advised to check BP only after medications for medication titration. She contacted the office 10/19/23 noting night sweats, leg cramping in setting of bacterial sinusitis. She was reassured that symptoms were not related to Vaslartan.   At visit 10/23/23 with PCP she was given course of azithromycine, prednisone . Due to insomnia, xanax  discontinued and prescribed trazodone .   Presents today for follow up independently. She reports still taking half tablet of xanax  at bedtime and reports some nights I sleep and some nights I don't which she attributes to leg cramps and hot flashes which have resolved. She has not  yet tried the trazodone  for sleep. Reports her upper cold symptoms seem to worsen anything with rain weather.   BP this morning 134/76 before her medication. Otherwise readings have been as low as 105/60 or as high as 160/91 in the evening. She does not always sit rest before checking BP.  She does not bring specific list for review today. Encouraged to bring blood pressure cuff to next office visit.   Half glass at night of pickle juice to help with cramps which does help.  Her cramps at night do improve if she walks at night. She has not had any lower extremity edema. She continues to walk in the morning regularly for exercise.   She is no longer taking her Valsartan , has returned back to Losartan  independently as does not feel the Valsartan  absorbed well in her system. She is taking half tablet of Spironolactone  once per day.   Previous antihypertensives: HCTZ-hyponatremia Amlodipine -edema  Past Medical History:  Diagnosis Date   Adenomatous colon polyp    Allergy    seasonal   Anemia    pt. denies   Anxiety    CAD (coronary artery disease)    Cataract    cataracts removed left eye.   Chronic lower back pain    Diverticulosis    GERD (gastroesophageal reflux disease)    Headache(784.0)    Hiatal hernia    Hyperlipidemia    Hypertension    IBS (irritable bowel syndrome) 10/13/2011   Osteoarthritis    Osteoporosis  Renal cysts, acquired, bilateral    SBO (small bowel obstruction) (HCC) 09/05/2015   Venous insufficiency     Past Surgical History:  Procedure Laterality Date   ABDOMINAL HYSTERECTOMY     BLADDER REPAIR     CARDIAC CATHETERIZATION  01/12/2002   90% stenosis of the left circumflex, stented with a 3x50mm Cypher stent, resulting in reduction of 90% to 10%    CARDIOVASCULAR STRESS TEST  03/22/2007   Mild inferolateral thinning toward the apex without significant ischemia. Nondiagnostic electrocardiogram.   CATARACT EXTRACTION     bilateral   COLONOSCOPY   08/17/2008   adenomatous polyp, diverticulosis, external hemorrhoids   COLONOSCOPY W/ BIOPSIES     multiple    LEFT LOWER EXTREMITY VENOUS DOPPLER  06/18/2011   No evidence of left lower extremity DVT   TRANSTHORACIC ECHOCARDIOGRAM  11/18/2012   EF 40-45%, LV systolic mild-moderately reduced, mild-moderate mitral valve regurg, mild-moderate tricuspid valve regurg. NSR-LBBB with occas PVCs   UPPER GASTROINTESTINAL ENDOSCOPY  07/03/2010   hiatal hernia   VARICOSE VEIN SURGERY      Current Medications: Current Meds  Medication Sig   acetaminophen  (TYLENOL ) 500 MG tablet Take 500 mg by mouth every 4 (four) hours as needed for mild pain or headache.   albuterol  (VENTOLIN  HFA) 108 (90 Base) MCG/ACT inhaler TAKE 2 PUFFS BY MOUTH EVERY 6 HOURS AS NEEDED FOR WHEEZE OR SHORTNESS OF BREATH   ALPRAZolam  (XANAX ) 0.5 MG tablet TAKE 1 TABLET BY MOUTH AT BEDTIME.   aspirin  EC 81 MG tablet Take 81 mg by mouth daily.   Calcium  Carbonate Antacid (CALCIUM  CARBONATE PO) Take 1 tablet by mouth daily.   Cholecalciferol (VITAMIN D -3 PO) Take 1 capsule by mouth daily.   cycloSPORINE  (RESTASIS ) 0.05 % ophthalmic emulsion Place 1 drop into both eyes 2 (two) times daily.   fluticasone  (FLONASE ) 50 MCG/ACT nasal spray Place 1 spray into both nostrils daily.   lansoprazole  (PREVACID ) 15 MG capsule TAKE 1 CAPSULE (15 MG TOTAL) BY MOUTH 2 (TWO) TIMES DAILY BEFORE A MEAL.   meloxicam  (MOBIC ) 7.5 MG tablet Take 7.5 mg by mouth daily.   metoprolol  succinate (TOPROL -XL) 50 MG 24 hr tablet Take 1 tablet (50 mg total) by mouth daily. Take with or immediately following a meal.   ondansetron  (ZOFRAN -ODT) 4 MG disintegrating tablet TAKE 1 TABLET BY MOUTH EVERY 8 HOURS AS NEEDED FOR NAUSEA AND VOMITING   PREMARIN vaginal cream Place 1 Application vaginally See admin instructions. Apply vaginally 2-3 times a week as directed   rosuvastatin  (CRESTOR ) 10 MG tablet TAKE 1 TABLET BY MOUTH EVERY DAY   sertraline  (ZOLOFT ) 25 MG tablet Take  1 tablet (25 mg total) by mouth daily.   spironolactone  (ALDACTONE ) 25 MG tablet Take 12.5 mg by mouth daily.   traZODone  (DESYREL ) 50 MG tablet Take 0.5 tablets (25 mg total) by mouth at bedtime.   valsartan  (DIOVAN ) 160 MG tablet Take 1 tablet in the evening     Allergies:   Lipitor [atorvastatin], Zocor  [simvastatin ], Doxycycline , Fosamax [alendronate], Levaquin [levofloxacin], Nexium [esomeprazole], Omnicef  [cefdinir ], Penicillins, Pneumococcal vaccine, Pneumovax [pneumococcal polysaccharide vaccine], Prilosec [omeprazole], Norvasc  [amlodipine ], Shellfish allergy, and Shellfish-derived products   Social History   Socioeconomic History   Marital status: Married    Spouse name: Elsie   Number of children: 2   Years of education: Not on file   Highest education level: Not on file  Occupational History   Occupation: retired  Tobacco Use   Smoking status: Never   Smokeless  tobacco: Never  Vaping Use   Vaping status: Never Used  Substance and Sexual Activity   Alcohol use: No    Alcohol/week: 0.0 standard drinks of alcohol   Drug use: No   Sexual activity: Not on file  Other Topics Concern   Not on file  Social History Narrative   Married to Chalmers who has congestive heart failure problems   She is retired never smoker rare alcohol   Social Drivers of Corporate investment banker Strain: Low Risk  (08/22/2022)   Overall Financial Resource Strain (CARDIA)    Difficulty of Paying Living Expenses: Not hard at all  Food Insecurity: No Food Insecurity (08/22/2022)   Hunger Vital Sign    Worried About Running Out of Food in the Last Year: Never true    Ran Out of Food in the Last Year: Never true  Transportation Needs: No Transportation Needs (08/22/2022)   PRAPARE - Administrator, Civil Service (Medical): No    Lack of Transportation (Non-Medical): No  Physical Activity: Insufficiently Active (08/22/2022)   Exercise Vital Sign    Days of Exercise per Week: 4 days     Minutes of Exercise per Session: 30 min  Stress: No Stress Concern Present (08/22/2022)   [employer] of Occupational Health - Occupational Stress Questionnaire    Feeling of Stress : Not at all  Social Connections: Moderately Isolated (08/22/2022)   Social Connection and Isolation Panel    Frequency of Communication with Friends and Family: More than three times a week    Frequency of Social Gatherings with Friends and Family: Twice a week    Attends Religious Services: Never    Database administrator or Organizations: No    Attends Engineer, structural: Never    Marital Status: Married     Family History: The patient's family history includes Heart attack in her father; Heart disease in her father; Stroke in her sister. There is no history of Colon cancer.  ROS:   Please see the history of present illness.     All other systems reviewed and are negative.  EKGs/Labs/Other Studies Reviewed:         Recent Labs: 09/16/2023: ALT 13; BUN 16; Creatinine, Ser 0.93; Hemoglobin 13.4; Platelets 214.0; Potassium 4.3; Sodium 135; TSH 1.17   Recent Lipid Panel    Component Value Date/Time   CHOL 128 09/16/2023 1448   CHOL 137 04/28/2017 0848   TRIG 80.0 09/16/2023 1448   HDL 47.90 09/16/2023 1448   HDL 44 04/28/2017 0848   CHOLHDL 3 09/16/2023 1448   VLDL 16.0 09/16/2023 1448   LDLCALC 64 09/16/2023 1448   LDLCALC 70 04/28/2017 0848   LDLDIRECT 160.4 12/19/2008 0815    Physical Exam:   VS:  BP (!) 162/90 Comment: left arm manual  Pulse 60   Ht 5' 5 (1.651 m)   Wt 144 lb 4.8 oz (65.5 kg)   SpO2 99%   BMI 24.01 kg/m  , BMI Body mass index is 24.01 kg/m.  Vitals:   11/05/23 1326 11/05/23 1329 11/05/23 1349  BP: (!) 201/96 (!) 200/104 (!) 162/90 Comment: left arm manual  Pulse: 60    Height: 5' 5 (1.651 m)    Weight: 144 lb 4.8 oz (65.5 kg)    SpO2: 99%    BMI (Calculated): 24.01      GENERAL:  Well appearing HEENT: Pupils equal round and reactive,  fundi not visualized, oral mucosa unremarkable NECK:  No jugular venous distention, waveform within normal limits, carotid upstroke brisk and symmetric, no bruits, no thyromegaly LYMPHATICS:  No cervical adenopathy LUNGS:  Clear to auscultation bilaterally HEART:  RRR.  PMI not displaced or sustained,S1 and S2 within normal limits, no S3, no S4, no clicks, no rubs, no murmurs ABD:  Flat, positive bowel sounds normal in frequency in pitch, no bruits, no rebound, no guarding, no midline pulsatile mass, no hepatomegaly, no splenomegaly EXT:  2 plus pulses throughout, no edema, no cyanosis no clubbing SKIN:  No rashes no nodules NEURO:  Cranial nerves II through XII grossly intact, motor grossly intact throughout PSYCH:  Cognitively intact, oriented to person place and time   ASSESSMENT/PLAN:    HTN -known element of whitecoat hypertension.   Initial BP in clinic today 201/96 and 200/104 which improved to 162/90 without intervention. BP this morning at home prior to medications 134/76. She has self returned from Valsartan  160mg  daily to Losartan  50mg  BID. As this is the second or third time she has self adjusted, will have her remain on Losartan  50mg  BID, refill provided. Continue Spironolactone  12.5mg  daily (prior hyperkalemia with higher doses). Continue Toprol  50mg  daily. Nurse visit in 1-2 weeks to assess accuracy of  BP cuff and have her bring her BP log as she did not bring today. If BP persistently >130/80 after medications plan to add Doxazosin 2mg  at bedtime at that time.  CAD / HLD, LDL goal <70 - prior DES 2003 to Cx. Stable with no anginal symptoms. No indication for ischemic evaluation.  GDMT aspirin81mg  daily, rosuvastatin  10mg  daily, toprol  50mg  daily. Recommend aiming for 150 minutes of moderate intensity activity per week and following a heart healthy diet.    NICM / HFrEF - Euvolemic and well compensated on exam. GDMT Toprol  50mg  daily, Spironolactone  12.5mg  daily ,Losartan  50mg  BID..  As NYHA I will defer SGLT2i but could be considered in the future.   Screening for Secondary Hypertension:     01/14/2023    2:31 PM  Causes  Drugs/Herbals Screened     - Comments limits sodium.  no caffeine .  Rare EtOH.  Occasional NSAIDS  Sleep Apnea Screened    Relevant Labs/Studies:    Latest Ref Rng & Units 09/16/2023    2:48 PM 04/23/2023    9:32 AM 04/06/2023    2:13 PM  Basic Labs  Sodium 135 - 145 mEq/L 135  140  142   Potassium 3.5 - 5.1 mEq/L 4.3  4.8  5.5   Creatinine 0.40 - 1.20 mg/dL 9.06  8.85  8.88        Latest Ref Rng & Units 09/16/2023    2:48 PM 01/26/2022    9:50 PM  Thyroid    TSH 0.35 - 5.50 uIU/mL 1.17  2.260                    Disposition:    Nurse visit in1 -2 weeks to bring BP log and cuff to ensure accuracy FU with MD/APP/PharmD in 3 months   Medication Adjustments/Labs and Tests Ordered: Current medicines are reviewed at length with the patient today.  Concerns regarding medicines are outlined above.  No orders of the defined types were placed in this encounter.  No orders of the defined types were placed in this encounter.    Signed, Reche GORMAN Finder, NP  11/05/2023 1:51 PM    [facility]

## 2023-11-05 NOTE — Patient Instructions (Addendum)
 Medication Instructions:  Continue Losartan  50mg  twice daily   Labwork: Your physician recommends that you return for lab work today: BMET   Follow-Up: Nurse visit 11/18/23 at 1pm   Bring blood pressure cuff Bring blood pressure log  Please follow up in 3 months in [facility] with Dr. Raford, Reche Finder, NP or Allean Mink PharmD    Special Instructions:    Check blood pressure at least ONE hour AFTER your medications.  Be sure to SIT 5 minutes before checking your blood pressure.  If you recheck your blood pressure, write down the lowest number.

## 2023-11-06 LAB — BASIC METABOLIC PANEL WITH GFR
BUN/Creatinine Ratio: 23 (ref 12–28)
BUN: 24 mg/dL (ref 10–36)
CO2: 23 mmol/L (ref 20–29)
Calcium: 9 mg/dL (ref 8.7–10.3)
Chloride: 96 mmol/L (ref 96–106)
Creatinine, Ser: 1.03 mg/dL — ABNORMAL HIGH (ref 0.57–1.00)
Glucose: 87 mg/dL (ref 70–99)
Potassium: 5 mmol/L (ref 3.5–5.2)
Sodium: 133 mmol/L — ABNORMAL LOW (ref 134–144)
eGFR: 51 mL/min/1.73 — ABNORMAL LOW (ref >59)

## 2023-11-08 ENCOUNTER — Ambulatory Visit (HOSPITAL_BASED_OUTPATIENT_CLINIC_OR_DEPARTMENT_OTHER): Payer: Self-pay | Admitting: Family

## 2023-11-14 ENCOUNTER — Other Ambulatory Visit: Payer: Self-pay | Admitting: Family Medicine

## 2023-11-14 DIAGNOSIS — F5101 Primary insomnia: Secondary | ICD-10-CM

## 2023-11-17 ENCOUNTER — Encounter: Payer: Self-pay | Admitting: Internal Medicine

## 2023-11-17 NOTE — Progress Notes (Addendum)
 "   Subjective:    Patient ID: Krystal [name], female    DOB: 29-Dec-1929, 89 y.o.   MRN: [id number]      HPI Krystal [name] is here for  Chief Complaint  Patient presents with   Fatigue    Nausea, headache off and on; fatigue,    Discussed the use of AI scribe software for clinical note transcription with the patient, who gave verbal consent to proceed.  History of Present Illness Krystal [name] is a 89 year old female who presents with episodes of headache, mid-abdominal discomfort, and nausea.  She has been experiencing episodes of heavy headache, mid-abdominal discomfort, and nausea for the past 2-3 weeks. These episodes occur approximately twice a day, lasting about 30 minutes, after which she can resume her activities. The headache is described as heavy and persistent, while the abdominal discomfort is a gnawing or raw feeling in the mid stomach, around the umbilicus.  Eating seems to alleviate the abdominal discomfort.  She has been taking Zyrtec for the headaches, which has helped, but the heavy feeling in her head persists.   She thinks her symptoms started when she was switched to valsartan  from losartan  to help better control her blood pressure.  She was experiencing cramping on the medication and has switched back to losartan .  She also takes lansoprazole  once daily for stomach acid.  No fever, chills, changes in appetite, or energy levels. She reports chronic nasal congestion and a dry cough that has been present intermittently. She experiences occasional lightheadedness but no significant dizziness. No chest pain, palpitations, or dyspnea. She reports occasional constipation but no diarrhea, and her urine is clear without any pain or hematuria.  She has been consuming pickle juice to alleviate muscle cramps, which she suspects might contribute to her stomach discomfort. She maintains a regular diet and fluid intake.     Medications and allergies reviewed with patient and updated if  appropriate.  Current Outpatient Medications on File Prior to Visit  Medication Sig Dispense Refill   acetaminophen  (TYLENOL ) 500 MG tablet Take 500 mg by mouth every 4 (four) hours as needed for mild pain or headache.     albuterol  (VENTOLIN  HFA) 108 (90 Base) MCG/ACT inhaler TAKE 2 PUFFS BY MOUTH EVERY 6 HOURS AS NEEDED FOR WHEEZE OR SHORTNESS OF BREATH 8.5 each 1   ALPRAZolam  (XANAX ) 0.5 MG tablet TAKE 1 TABLET BY MOUTH AT BEDTIME. 90 tablet 1   aspirin  EC 81 MG tablet Take 81 mg by mouth daily.     Calcium  Carbonate Antacid (CALCIUM  CARBONATE PO) Take 1 tablet by mouth daily.     Cholecalciferol (VITAMIN D -3 PO) Take 1 capsule by mouth daily.     cycloSPORINE  (RESTASIS ) 0.05 % ophthalmic emulsion Place 1 drop into both eyes 2 (two) times daily.     fluticasone  (FLONASE ) 50 MCG/ACT nasal spray Place 1 spray into both nostrils daily. 48 mL 1   lansoprazole  (PREVACID ) 15 MG capsule TAKE 1 CAPSULE (15 MG TOTAL) BY MOUTH 2 (TWO) TIMES DAILY BEFORE A MEAL. 180 capsule 3   losartan  (COZAAR ) 50 MG tablet Take 1 tablet (50 mg total) by mouth 2 (two) times daily. 180 tablet 1   metoprolol  succinate (TOPROL -XL) 50 MG 24 hr tablet Take 1 tablet (50 mg total) by mouth daily. Take with or immediately following a meal. 90 tablet 3   ondansetron  (ZOFRAN -ODT) 4 MG disintegrating tablet TAKE 1 TABLET BY MOUTH EVERY 8 HOURS AS NEEDED FOR NAUSEA AND VOMITING 18  tablet 1   PREMARIN vaginal cream Place 1 Application vaginally See admin instructions. Apply vaginally 2-3 times a week as directed     rosuvastatin  (CRESTOR ) 10 MG tablet TAKE 1 TABLET BY MOUTH EVERY DAY 90 tablet 1   sertraline  (ZOLOFT ) 25 MG tablet Take 1 tablet (25 mg total) by mouth daily. 90 tablet 3   spironolactone  (ALDACTONE ) 25 MG tablet Take 0.5 tablets (12.5 mg total) by mouth daily. 45 tablet 1   No current facility-administered medications on file prior to visit.    Review of Systems  Constitutional:  Negative for appetite change,  chills, fatigue and fever.  HENT:  Positive for congestion (chronic), rhinorrhea and sinus pressure (chronic). Negative for sore throat.   Respiratory:  Positive for cough (chronic, intermittent). Negative for shortness of breath and wheezing.   Cardiovascular:  Negative for chest pain, palpitations and leg swelling.  Gastrointestinal:  Positive for abdominal pain (periumbilical area), constipation (occ, mild) and nausea. Negative for blood in stool (no melena) and diarrhea.       No gerd - controlled  Genitourinary:  Negative for dysuria, frequency and hematuria.       No urine odor  Neurological:  Positive for dizziness (occ - chronic - not new) and headaches. Negative for light-headedness (occ - chronic - no change).       Objective:   Vitals:   11/18/23 0905  BP: 132/80  Pulse: (!) 59  Temp: 98 F (36.7 C)  SpO2: 94%   BP Readings from Last 3 Encounters:  11/18/23 132/80  11/05/23 (!) 162/90  10/23/23 128/82   Wt Readings from Last 3 Encounters:  11/18/23 143 lb (64.9 kg)  11/05/23 144 lb 4.8 oz (65.5 kg)  10/23/23 141 lb 9.6 oz (64.2 kg)   Body mass index is 23.8 kg/m.    Physical Exam Constitutional:      General: She is not in acute distress.    Appearance: Normal appearance.  HENT:     Head: Normocephalic and atraumatic.  Eyes:     Conjunctiva/sclera: Conjunctivae normal.  Cardiovascular:     Rate and Rhythm: Normal rate and regular rhythm.     Heart sounds: Normal heart sounds.  Pulmonary:     Effort: Pulmonary effort is normal. No respiratory distress.     Breath sounds: Normal breath sounds. No wheezing.  Abdominal:     Palpations: Abdomen is soft.     Tenderness: There is abdominal tenderness (minimal periumbilical). There is no guarding or rebound.  Musculoskeletal:     Cervical back: Neck supple.     Right lower leg: No edema.     Left lower leg: No edema.  Lymphadenopathy:     Cervical: No cervical adenopathy.  Skin:    General: Skin is warm  and dry.     Findings: No rash.  Neurological:     Mental Status: She is alert. Mental status is at baseline.  Psychiatric:        Mood and Affect: Mood normal.        Behavior: Behavior normal.            Assessment & Plan:    Assessment and Plan Assessment & Plan mid-abdominal discomfort and nausea Symptoms likely related to stomach lining irritation, possibly exacerbated by pickle juice. Symptoms improved after discontinuing valsartan . - Increase lansoprazole  to twice daily for one-two weeks, then reduce to once daily. - Avoid pickle juice. - Monitor symptoms and return if no improvement.  Headaches, Chronic  nasal congestion and sinus symptoms Chronic nasal congestion with sinus drainage improved with Zyrtec - covid test negative here today - Take Zyrtec at night to manage symptoms and reduce daytime drowsiness.    Hypertension Hypertension well-controlled   - continue losartan  50 mg twice daily, metoprolol  XL 50 mg daily, spironolactone  12.5 mg daily - Continue monitoring blood pressure on losartan .       "

## 2023-11-18 ENCOUNTER — Ambulatory Visit: Admitting: Internal Medicine

## 2023-11-18 ENCOUNTER — Ambulatory Visit (INDEPENDENT_AMBULATORY_CARE_PROVIDER_SITE_OTHER): Admitting: *Deleted

## 2023-11-18 VITALS — BP 132/80 | HR 59 | Temp 98.0°F | Ht 65.0 in | Wt 143.0 lb

## 2023-11-18 DIAGNOSIS — I10 Essential (primary) hypertension: Secondary | ICD-10-CM | POA: Diagnosis not present

## 2023-11-18 DIAGNOSIS — R110 Nausea: Secondary | ICD-10-CM

## 2023-11-18 LAB — POC COVID19 BINAXNOW: SARS Coronavirus 2 Ag: NEGATIVE

## 2023-11-18 NOTE — Patient Instructions (Addendum)
   Your covid test is negative     Medications changes include :   increase lansoprazole  to twice a day for a week and then decrease back to once a day.   Take zyrtec at night.   Avoid the pickle juice for now    Return if symptoms worsen or fail to improve.

## 2023-11-18 NOTE — Progress Notes (Unsigned)
   Nurse Visit   Date of Encounter: 11/18/2023 ID: Krystal [name], DOB 09-25-1929, MRN [id number]  PCP:  Krystal Debby LITTIE, MD   [name] Providers Cardiologist:  Krystal Lesches, MD { Click to update primary MD,subspecialty MD or APP then REFRESH:1}     Visit Details   VS:  Ht 5' 5 (1.651 m)   Wt 143 lb 9.6 oz (65.1 kg)   BMI 23.90 kg/m  , BMI Body mass index is 23.9 kg/m.  Wt Readings from Last 3 Encounters:  11/18/23 143 lb 9.6 oz (65.1 kg)  11/18/23 143 lb (64.9 kg)  11/05/23 144 lb 4.8 oz (65.5 kg)     Reason for visit: blood pressure check  Performed today: Vitals 148/88 home machine 148/76 with office check  Changes (medications, testing, etc.) : *** Length of Visit: *** minutes    Medications Adjustments/Labs and Tests Ordered: No orders of the defined types were placed in this encounter.  No orders of the defined types were placed in this encounter.    Krystal Newell Birk, LPN  1/79/7974 8:72 PM

## 2023-11-19 ENCOUNTER — Telehealth (HOSPITAL_BASED_OUTPATIENT_CLINIC_OR_DEPARTMENT_OTHER): Payer: Self-pay | Admitting: Family

## 2023-11-19 NOTE — Telephone Encounter (Signed)
 Nurse visit from yesterday reviewed. Home BP cuff found to be accurate.  If home BP routinely >130/80, recommend addition of Doxazosin 2mg  at bedtime. If home BP fluctuates both above and below 130/80, continue present regimen and follow up as scheduled.   Krystal [name] S Krystal Hendershott, NP

## 2023-11-20 NOTE — Telephone Encounter (Addendum)
 Spoke with daughter and will call patient next week at daughters request

## 2023-11-20 NOTE — Addendum Note (Signed)
 Addended by: FREDIRICK NEWELL NOVAK on: 11/20/2023 05:22 PM   Modules accepted: Orders

## 2023-11-23 NOTE — Telephone Encounter (Signed)
 Left message to call back.

## 2023-11-24 MED ORDER — DOXAZOSIN MESYLATE 2 MG PO TABS: 2.0000 mg | ORAL_TABLET | Freq: Every day | ORAL | 1 refills | Status: DC

## 2023-11-24 NOTE — Telephone Encounter (Signed)
 Pt returning call

## 2023-11-24 NOTE — Addendum Note (Signed)
 Addended by: FREDIRICK NEWELL NOVAK on: 11/24/2023 05:31 PM   Modules accepted: Orders

## 2023-11-24 NOTE — Telephone Encounter (Signed)
 Advised patient, verbalized understanding

## 2023-12-04 ENCOUNTER — Other Ambulatory Visit: Payer: Self-pay | Admitting: Internal Medicine

## 2023-12-04 DIAGNOSIS — J301 Allergic rhinitis due to pollen: Secondary | ICD-10-CM

## 2023-12-12 ENCOUNTER — Encounter: Payer: Self-pay | Admitting: Cardiovascular Disease

## 2023-12-12 ENCOUNTER — Encounter: Payer: Self-pay | Admitting: Internal Medicine

## 2024-01-01 DIAGNOSIS — H26492 Other secondary cataract, left eye: Secondary | ICD-10-CM | POA: Diagnosis not present

## 2024-01-01 DIAGNOSIS — H43813 Vitreous degeneration, bilateral: Secondary | ICD-10-CM | POA: Diagnosis not present

## 2024-01-01 DIAGNOSIS — H524 Presbyopia: Secondary | ICD-10-CM | POA: Diagnosis not present

## 2024-01-01 DIAGNOSIS — H04123 Dry eye syndrome of bilateral lacrimal glands: Secondary | ICD-10-CM | POA: Diagnosis not present

## 2024-01-01 DIAGNOSIS — H5212 Myopia, left eye: Secondary | ICD-10-CM | POA: Diagnosis not present

## 2024-01-01 DIAGNOSIS — H0100B Unspecified blepharitis left eye, upper and lower eyelids: Secondary | ICD-10-CM | POA: Diagnosis not present

## 2024-01-01 DIAGNOSIS — H0100A Unspecified blepharitis right eye, upper and lower eyelids: Secondary | ICD-10-CM | POA: Diagnosis not present

## 2024-01-01 DIAGNOSIS — H52201 Unspecified astigmatism, right eye: Secondary | ICD-10-CM | POA: Diagnosis not present

## 2024-01-08 ENCOUNTER — Ambulatory Visit (INDEPENDENT_AMBULATORY_CARE_PROVIDER_SITE_OTHER)

## 2024-01-08 DIAGNOSIS — Z23 Encounter for immunization: Secondary | ICD-10-CM

## 2024-01-08 NOTE — Progress Notes (Signed)
 Pt was given High dose flu vaccine with no complications.

## 2024-01-11 ENCOUNTER — Encounter (HOSPITAL_BASED_OUTPATIENT_CLINIC_OR_DEPARTMENT_OTHER): Payer: Self-pay

## 2024-01-12 DIAGNOSIS — H26492 Other secondary cataract, left eye: Secondary | ICD-10-CM | POA: Diagnosis not present

## 2024-01-17 ENCOUNTER — Other Ambulatory Visit (HOSPITAL_BASED_OUTPATIENT_CLINIC_OR_DEPARTMENT_OTHER): Payer: Self-pay | Admitting: Cardiovascular Disease

## 2024-01-17 DIAGNOSIS — I10 Essential (primary) hypertension: Secondary | ICD-10-CM

## 2024-02-04 ENCOUNTER — Other Ambulatory Visit: Payer: Self-pay | Admitting: Internal Medicine

## 2024-02-04 DIAGNOSIS — E785 Hyperlipidemia, unspecified: Secondary | ICD-10-CM

## 2024-02-04 DIAGNOSIS — I2510 Atherosclerotic heart disease of native coronary artery without angina pectoris: Secondary | ICD-10-CM

## 2024-02-04 DIAGNOSIS — R062 Wheezing: Secondary | ICD-10-CM

## 2024-02-11 ENCOUNTER — Ambulatory Visit (HOSPITAL_BASED_OUTPATIENT_CLINIC_OR_DEPARTMENT_OTHER): Admitting: Family

## 2024-02-11 ENCOUNTER — Encounter (HOSPITAL_BASED_OUTPATIENT_CLINIC_OR_DEPARTMENT_OTHER): Payer: Self-pay | Admitting: Family

## 2024-02-11 VITALS — BP 148/72 | HR 59 | Ht 65.0 in | Wt 148.0 lb

## 2024-02-11 DIAGNOSIS — I10 Essential (primary) hypertension: Secondary | ICD-10-CM

## 2024-02-11 DIAGNOSIS — Z9861 Coronary angioplasty status: Secondary | ICD-10-CM | POA: Diagnosis not present

## 2024-02-11 DIAGNOSIS — I2510 Atherosclerotic heart disease of native coronary artery without angina pectoris: Secondary | ICD-10-CM | POA: Diagnosis not present

## 2024-02-11 DIAGNOSIS — E785 Hyperlipidemia, unspecified: Secondary | ICD-10-CM

## 2024-02-11 MED ORDER — DOXAZOSIN MESYLATE 2 MG PO TABS: 2.0000 mg | ORAL_TABLET | Freq: Every evening | ORAL | 1 refills | Status: AC

## 2024-02-11 NOTE — Progress Notes (Signed)
 [facility] Assessment:    Date:  02/11/2024   ID:  Krystal [name], DOB 02/28/1930, MRN [id number]  PCP:  Joshua Debby LITTIE, MD  Cardiologist:  Dorn Lesches, MD  Nephrologist:  Referring MD: Joshua Debby LITTIE, MD   CC: Hypertension  History of Present Illness:    Krystal [name] is a 89 y.o. female with a hx of CAD with prior DES to circumflex in 2003, HTN, HLD, GERD, CKD, NICM, anxiety here to follow up in the [facility].   She is nonischemic cardiomyopathy with LVEF 40% by echo 2014.  Established with Dr. Raford in [facility] 01/14/23.  She had recently been seen in the ED with SBP greater than 200 for several days.  Due to blood pressure med difficult to control with variable readings at home.  Hydralazine  was stopped due to difficulty of 3 times daily dosing.  She was started on spironolactone  25 mg daily which was later reduced to 12.5 mg daily due to hyperkalemia.  At visit with pharmacy team 02/19/2023 after discussion with Dr. Raford she was started on sertraline  for anxiety.  At visit 08/03/2023 losartan  stopped and transition to valsartan .  Last seen 09/03/23 her average BP in the morning was 134/76 with average afternoon blood pressure 131/78.  SBP range 120-140s.  Her Valsartan  was moved to evening dosing. She was again advised to check BP only after medications for medication titration. She contacted the office 10/19/23 noting night sweats, leg cramping in setting of bacterial sinusitis. She was reassured that symptoms were not related to Vaslartan.   At visit 10/23/23 with PCP she was given course of azithromycine, prednisone . Due to insomnia, xanax  discontinued and prescribed trazodone .   She was seen 11/05/23. BP elevated in clinic with known white coat hypertension but reported reasonably controlled at hom. She was asked to return to assess accuracy of BP cuff. Nurse visit 11/18/23 home BP cuff 148/88 and manual reading  148/76. Doxazosin  2mg  at bedtime was added.  Presents today for follow up. She is taking Doxazosin  every evening around 7. She perceived it as increasing her urination in the evening when she took it at bedtime. Reviewed that it is not a fluid pill. Feeling overall well. Reports no shortness of breath nor dyspnea on exertion. Reports no chest pain, pressure, or tightness. No edema, orthopnea, PND. Reports no palpitations.  Exercising by walking. Home BP readings over the last month has ranged from 109/61-134/73. She has occasional hypotensive reading in the morning after medications with some fatigue. No lightheadedness, near syncope, syncope. As she feels reasonably well and her BP is better controlled than previously, prefers to continue her present regimen.   Previous antihypertensives: HCTZ-hyponatremia Amlodipine -edema  Past Medical History:  Diagnosis Date   Adenomatous colon polyp    Allergy    seasonal   Anemia    pt. denies   Anxiety    CAD (coronary artery disease)    Cataract    cataracts removed left eye.   Chronic lower back pain    Diverticulosis    GERD (gastroesophageal reflux disease)    Headache(784.0)    Hiatal hernia    Hyperlipidemia    Hypertension    IBS (irritable bowel syndrome) 10/13/2011   Osteoarthritis    Osteoporosis    Renal cysts, acquired, bilateral    SBO (small bowel obstruction) (HCC) 09/05/2015   Venous insufficiency     Past Surgical History:  Procedure Laterality Date   ABDOMINAL HYSTERECTOMY  BLADDER REPAIR     CARDIAC CATHETERIZATION  01/12/2002   90% stenosis of the left circumflex, stented with a 3x53mm Cypher stent, resulting in reduction of 90% to 10%    CARDIOVASCULAR STRESS TEST  03/22/2007   Mild inferolateral thinning toward the apex without significant ischemia. Nondiagnostic electrocardiogram.   CATARACT EXTRACTION     bilateral   COLONOSCOPY  08/17/2008   adenomatous polyp, diverticulosis, external hemorrhoids    COLONOSCOPY W/ BIOPSIES     multiple    LEFT LOWER EXTREMITY VENOUS DOPPLER  06/18/2011   No evidence of left lower extremity DVT   TRANSTHORACIC ECHOCARDIOGRAM  11/18/2012   EF 40-45%, LV systolic mild-moderately reduced, mild-moderate mitral valve regurg, mild-moderate tricuspid valve regurg. NSR-LBBB with occas PVCs   UPPER GASTROINTESTINAL ENDOSCOPY  07/03/2010   hiatal hernia   VARICOSE VEIN SURGERY      Current Medications: Current Meds  Medication Sig   acetaminophen  (TYLENOL ) 500 MG tablet Take 500 mg by mouth every 4 (four) hours as needed for mild pain or headache.   albuterol  (VENTOLIN  HFA) 108 (90 Base) MCG/ACT inhaler TAKE 2 PUFFS BY MOUTH EVERY 6 HOURS AS NEEDED FOR WHEEZE OR SHORTNESS OF BREATH   ALPRAZolam  (XANAX ) 0.5 MG tablet TAKE 1 TABLET BY MOUTH AT BEDTIME.   aspirin  EC 81 MG tablet Take 81 mg by mouth daily.   Calcium  Carbonate Antacid (CALCIUM  CARBONATE PO) Take 1 tablet by mouth daily.   Cholecalciferol (VITAMIN D -3 PO) Take 1 capsule by mouth daily.   cycloSPORINE  (RESTASIS ) 0.05 % ophthalmic emulsion Place 1 drop into both eyes 2 (two) times daily.   doxazosin  (CARDURA ) 2 MG tablet Take 1 tablet (2 mg total) by mouth at bedtime. (Patient taking differently: Take 2 mg by mouth every morning.)   fluticasone  (FLONASE ) 50 MCG/ACT nasal spray SPRAY 1 SPRAY INTO BOTH NOSTRILS DAILY.   lansoprazole  (PREVACID ) 15 MG capsule TAKE 1 CAPSULE (15 MG TOTAL) BY MOUTH 2 (TWO) TIMES DAILY BEFORE A MEAL.   losartan  (COZAAR ) 50 MG tablet Take 1 tablet (50 mg total) by mouth 2 (two) times daily.   metoprolol  succinate (TOPROL -XL) 50 MG 24 hr tablet Take 1 tablet (50 mg total) by mouth daily. Take with or immediately following a meal.   ondansetron  (ZOFRAN -ODT) 4 MG disintegrating tablet TAKE 1 TABLET BY MOUTH EVERY 8 HOURS AS NEEDED FOR NAUSEA AND VOMITING   PREMARIN vaginal cream Place 1 Application vaginally See admin instructions. Apply vaginally 2-3 times a week as directed    rosuvastatin  (CRESTOR ) 10 MG tablet TAKE 1 TABLET BY MOUTH EVERY DAY   sertraline  (ZOLOFT ) 25 MG tablet Take 1 tablet (25 mg total) by mouth daily.   spironolactone  (ALDACTONE ) 25 MG tablet Take 0.5 tablets (12.5 mg total) by mouth daily.     Allergies:   Lipitor [atorvastatin], Zocor  [simvastatin ], Doxycycline , Fosamax [alendronate], Levaquin [levofloxacin], Nexium [esomeprazole], Omnicef  [cefdinir ], Penicillins, Pneumococcal vaccine, Pneumovax [pneumococcal polysaccharide vaccine], Prilosec [omeprazole], Norvasc  [amlodipine ], Shellfish allergy, and Shellfish protein-containing drug products   Social History   Socioeconomic History   Marital status: Married    Spouse name: Elsie   Number of children: 2   Years of education: Not on file   Highest education level: Not on file  Occupational History   Occupation: retired  Tobacco Use   Smoking status: Never    Passive exposure: Never   Smokeless tobacco: Never  Vaping Use   Vaping status: Never Used  Substance and Sexual Activity   Alcohol use: No  Alcohol/week: 0.0 standard drinks of alcohol   Drug use: No   Sexual activity: Not on file  Other Topics Concern   Not on file  Social History Narrative   Married to Hart who has congestive heart failure problems   She is retired never smoker rare alcohol   Social Drivers of Corporate Investment Banker Strain: Low Risk  (08/22/2022)   Overall Financial Resource Strain (CARDIA)    Difficulty of Paying Living Expenses: Not hard at all  Food Insecurity: No Food Insecurity (02/11/2024)   Hunger Vital Sign    Worried About Running Out of Food in the Last Year: Never true    Ran Out of Food in the Last Year: Never true  Transportation Needs: No Transportation Needs (08/22/2022)   PRAPARE - Administrator, Civil Service (Medical): No    Lack of Transportation (Non-Medical): No  Physical Activity: Insufficiently Active (08/22/2022)   Exercise Vital Sign    Days of Exercise  per Week: 4 days    Minutes of Exercise per Session: 30 min  Stress: No Stress Concern Present (08/22/2022)   [employer] of Occupational Health - Occupational Stress Questionnaire    Feeling of Stress : Not at all  Social Connections: Moderately Isolated (08/22/2022)   Social Connection and Isolation Panel    Frequency of Communication with Friends and Family: More than three times a week    Frequency of Social Gatherings with Friends and Family: Twice a week    Attends Religious Services: Never    Database Administrator or Organizations: No    Attends Engineer, Structural: Never    Marital Status: Married     Family History: The patient's family history includes Heart attack in her father; Heart disease in her father; Stroke in her sister. There is no history of Colon cancer.  ROS:   Please see the history of present illness.     All other systems reviewed and are negative.  EKGs/Labs/Other Studies Reviewed:    EKG Interpretation Date/Time:  Thursday February 11 2024 13:57:34 EST Ventricular Rate:  59 PR Interval:  222 QRS Duration:  150 QT Interval:  478 QTC Calculation: 473 R Axis:   4  Text Interpretation: Sinus bradycardia with 1st degree A-V block Left bundle branch block Confirmed by Vannie Mora ([id number]) on 02/11/2024 2:04:01 PM    Recent Labs: 09/16/2023: ALT 13; Hemoglobin 13.4; Platelets 214.0; TSH 1.17 11/05/2023: BUN 24; Creatinine, Ser 1.03; Potassium 5.0; Sodium 133   Recent Lipid Panel    Component Value Date/Time   CHOL 128 09/16/2023 1448   CHOL 137 04/28/2017 0848   TRIG 80.0 09/16/2023 1448   HDL 47.90 09/16/2023 1448   HDL 44 04/28/2017 0848   CHOLHDL 3 09/16/2023 1448   VLDL 16.0 09/16/2023 1448   LDLCALC 64 09/16/2023 1448   LDLCALC 70 04/28/2017 0848   LDLDIRECT 160.4 12/19/2008 0815    Physical Exam:   VS:  BP (!) 148/72 (BP Location: Right Arm, Patient Position: Sitting, Cuff Size: Normal)   Pulse (!) 59   Ht 5' 5 (1.651  m)   Wt 148 lb (67.1 kg)   SpO2 95%   BMI 24.63 kg/m  , BMI Body mass index is 24.63 kg/m.  Vitals:   02/11/24 1351  BP: (!) 148/72  Pulse: (!) 59  Height: 5' 5 (1.651 m)  Weight: 148 lb (67.1 kg)  SpO2: 95%  BMI (Calculated): 24.63    GENERAL:  Well  appearing HEENT: Pupils equal round and reactive, fundi not visualized, oral mucosa unremarkable NECK:  No jugular venous distention, waveform within normal limits, carotid upstroke brisk and symmetric, no bruits, no thyromegaly LYMPHATICS:  No cervical adenopathy LUNGS:  Clear to auscultation bilaterally HEART:  RRR.  PMI not displaced or sustained,S1 and S2 within normal limits, no S3, no S4, no clicks, no rubs, no murmurs ABD:  Flat, positive bowel sounds normal in frequency in pitch, no bruits, no rebound, no guarding, no midline pulsatile mass, no hepatomegaly, no splenomegaly EXT:  2 plus pulses throughout, no edema, no cyanosis no clubbing SKIN:  No rashes no nodules NEURO:  Cranial nerves II through XII grossly intact, motor grossly intact throughout PSYCH:  Cognitively intact, oriented to person place and time   ASSESSMENT/PLAN:    HTN -Known element of whitecoat hypertension.  Home BP cuff previously found to be accurate. Home readings well controlled.  She previously has returned to Losartan  from Valsartan  as perceived adverse effects on Valsartan . As such, continue Losartan  50mg  BID.  Continue Spironolactone  12.5mg  daily (prior hyperkalemia with higher doses).  Continue Toprol  50mg  daily.  Continue Doxazosin  2mg  every evening.   CAD / HLD, LDL goal <70 - prior DES 2003 to Cx. Stable with no anginal symptoms. No indication for ischemic evaluation.  GDMT aspirin81mg  daily, rosuvastatin  10mg  daily, toprol  50mg  daily. Recommend aiming for 150 minutes of moderate intensity activity per week and following a heart healthy diet.    NICM / HFrEF - Euvolemic and well compensated on exam. GDMT Toprol  50mg  daily, Spironolactone   12.5mg  daily, Losartan  50mg  BID. As NYHA I will defer SGLT2i but could be considered in the future.   Screening for Secondary Hypertension:     01/14/2023    2:31 PM  Causes  Drugs/Herbals Screened     - Comments limits sodium.  no caffeine .  Rare EtOH.  Occasional NSAIDS  Sleep Apnea Screened    Relevant Labs/Studies:    Latest Ref Rng & Units 11/05/2023    2:23 PM 09/16/2023    2:48 PM 04/23/2023    9:32 AM  Basic Labs  Sodium 134 - 144 mmol/L 133  135  140   Potassium 3.5 - 5.2 mmol/L 5.0  4.3  4.8   Creatinine 0.57 - 1.00 mg/dL 8.96  9.06  8.85        Latest Ref Rng & Units 09/16/2023    2:48 PM 01/26/2022    9:50 PM  Thyroid    TSH 0.35 - 5.50 uIU/mL 1.17  2.260                    Disposition:     FU with MD/APP/PharmD in 4 months   Medication Adjustments/Labs and Tests Ordered: Current medicines are reviewed at length with the patient today.  Concerns regarding medicines are outlined above.  Orders Placed This Encounter  Procedures   EKG 12-Lead   No orders of the defined types were placed in this encounter.    Signed, Reche GORMAN Finder, NP  02/11/2024 2:04 PM    [facility]

## 2024-02-11 NOTE — Patient Instructions (Signed)
 Medication Instructions:  Your physician recommends that you continue on your current medications as directed. Please refer to the Current Medication list given to you today.    Follow-Up: Please follow up in 4 months in [facility] with Dr. Raford, Reche Finder, NP or Allean Mink PharmD

## 2024-03-10 ENCOUNTER — Ambulatory Visit: Admitting: Family Medicine

## 2024-03-28 ENCOUNTER — Telehealth: Payer: Self-pay | Admitting: Physician Assistant

## 2024-03-28 NOTE — Telephone Encounter (Signed)
 Inbound call from patient stating that she is having a lot of Bloating and Gas. Patient stated that she would like to be prescribed a medication for this. Patient was scheduled with the PA Jessica Zehr for January the 6 th. Patient is requesting a call back. Please advise.

## 2024-03-31 ENCOUNTER — Ambulatory Visit (HOSPITAL_COMMUNITY)
Admission: EM | Admit: 2024-03-31 | Discharge: 2024-03-31 | Disposition: A | Discharge status: Home / Self Care | Source: Home / Self Care

## 2024-03-31 ENCOUNTER — Encounter (HOSPITAL_COMMUNITY): Payer: Self-pay | Admitting: *Deleted

## 2024-03-31 DIAGNOSIS — I10 Essential (primary) hypertension: Secondary | ICD-10-CM

## 2024-03-31 DIAGNOSIS — M545 Low back pain, unspecified: Secondary | ICD-10-CM | POA: Diagnosis not present

## 2024-03-31 DIAGNOSIS — K5900 Constipation, unspecified: Secondary | ICD-10-CM | POA: Diagnosis not present

## 2024-03-31 LAB — POCT URINE DIPSTICK
Bilirubin, UA: NEGATIVE
Blood, UA: NEGATIVE
Glucose, UA: NEGATIVE mg/dL
Leukocytes, UA: NEGATIVE
Nitrite, UA: NEGATIVE
Protein Ur, POC: NEGATIVE mg/dL
Spec Grav, UA: 1.01
Urobilinogen, UA: 0.2 U/dL
pH, UA: 6

## 2024-03-31 MED ORDER — POLYETHYLENE GLYCOL 3350 17 GM/SCOOP PO POWD: 17.0000 g | Freq: Every day | ORAL | 0 refills | Status: AC | PRN

## 2024-03-31 NOTE — ED Triage Notes (Signed)
 Pt states she has nausea and some low back pain since this morning. She took some advil this morning for her back pain. The pain is worse with movement but she is worried her fluid pill is making her have a UTI.

## 2024-03-31 NOTE — Discharge Instructions (Signed)
 Your low back pain is likely due to constipation.  Take 1 capful of miralax and place it into 1-1.5 cups of water or juice. Drink this mixture once daily for the next 2-3 days until you are able to have a normal soft bowel movement.  You may take tylenol  650mg  every 6 hours as needed for back pain. Continue checking your BP at home and follow-up with your primary care provider for ongoing management/evaluation of your back pain.   If you develop any new or worsening symptoms or if your symptoms do not start to improve, please return here or follow-up with your primary care provider. If your symptoms are severe, please go to the emergency room.

## 2024-03-31 NOTE — ED Provider Notes (Signed)
 " [facility]    CSN: [id number] Arrival date & time: 03/31/24  1334      History   Chief Complaint Chief Complaint  Patient presents with   Back Pain    HPI Krystal [name] is a 89 y.o. female.   Krystal [name] is a 89 y.o. female presenting for chief complaint of nausea, bilateral low back pain, and abdominal bloating/increased flatulence that started this morning.  Her bloating and flatulence has since improved since she took her probiotic this morning.  Her bilateral low back pain has been persistent and is worsened by movement and position changes.  She takes spironolactone  diuretic and wonders if this is causing her to experience a urinary tract infection.  She denies dysuria, urinary frequency, urinary urgency, gross hematuria, abdominal pain, vomiting, flank pain, headaches, dizziness, and recent antibiotic use.  She further denies groin pain, saddle anesthesia, paresthesias to the extremities, and unilateral extremity weakness.  No recent falls.  Her last normal bowel movement was 2 days ago which is abnormal for her.  She typically has a bowel movement daily.  She denies blood or mucus in the stools and fever/chills.  She has not attempted use of any over-the-counter medications to help with symptoms prior to arrival.  Blood pressure is noted to be elevated in triage at 209/84.  She takes her blood pressure regularly at home, this morning her blood pressure was 124/84.  On recheck, blood pressure 172/82.  She has taken her blood pressure medication this morning and has a long history of hypertension. Denies CP, SOB, palpitations, dizziness, extremity weakness, headache, vision changes, and paresthesias.      Back Pain   Past Medical History:  Diagnosis Date   Adenomatous colon polyp    Allergy    seasonal   Anemia    pt. denies   Anxiety    CAD (coronary artery disease)    Cataract    cataracts removed left eye.   Chronic lower back pain    Diverticulosis     GERD (gastroesophageal reflux disease)    Headache(784.0)    Hiatal hernia    Hyperlipidemia    Hypertension    IBS (irritable bowel syndrome) 10/13/2011   Osteoarthritis    Osteoporosis    Renal cysts, acquired, bilateral    SBO (small bowel obstruction) (HCC) 09/05/2015   Venous insufficiency     Patient Active Problem List   Diagnosis Date Noted   Impingement syndrome of right shoulder region 04/22/2023   Cervical radiculopathy 04/29/2022   DDD (degenerative disc disease), cervical 04/09/2022   Chronic systolic CHF (congestive heart failure) (HCC) 01/28/2022   Encounter for general adult medical examination with abnormal findings 05/08/2020   Chronic renal disease, stage 3, moderately decreased glomerular filtration rate (GFR) between 30-59 mL/min/1.73 square meter (HCC) 11/08/2018   Chronic idiopathic constipation 10/19/2017   Cardiomyopathy (HCC) 07/16/2016   Benign paroxysmal positional vertigo 01/17/2016   Prediabetes 05/23/2015   IBS (irritable bowel syndrome) 10/13/2011   GERD (gastroesophageal reflux disease) 06/25/2010   Allergic rhinitis 10/06/2007   Hyperlipidemia with target LDL less than 70 07/19/2007   GAD (generalized anxiety disorder) 07/19/2007   Essential hypertension 07/19/2007   CAD S/P percutaneous coronary angioplasty 07/19/2007    Past Surgical History:  Procedure Laterality Date   ABDOMINAL HYSTERECTOMY     BLADDER REPAIR     CARDIAC CATHETERIZATION  01/12/2002   90% stenosis of the left circumflex, stented with a 3x64mm Cypher stent, resulting in  reduction of 90% to 10%    CARDIOVASCULAR STRESS TEST  03/22/2007   Mild inferolateral thinning toward the apex without significant ischemia. Nondiagnostic electrocardiogram.   CATARACT EXTRACTION     bilateral   COLONOSCOPY  08/17/2008   adenomatous polyp, diverticulosis, external hemorrhoids   COLONOSCOPY W/ BIOPSIES     multiple    LEFT LOWER EXTREMITY VENOUS DOPPLER  06/18/2011   No evidence of left  lower extremity DVT   TRANSTHORACIC ECHOCARDIOGRAM  11/18/2012   EF 40-45%, LV systolic mild-moderately reduced, mild-moderate mitral valve regurg, mild-moderate tricuspid valve regurg. NSR-LBBB with occas PVCs   UPPER GASTROINTESTINAL ENDOSCOPY  07/03/2010   hiatal hernia   VARICOSE VEIN SURGERY      OB History   No obstetric history on file.      Home Medications    Prior to Admission medications  Medication Sig Start Date End Date Taking? Authorizing Provider  aspirin  EC 81 MG tablet Take 81 mg by mouth daily.   Yes [provider]  Calcium  Carbonate Antacid (CALCIUM  CARBONATE PO) Take 1 tablet by mouth daily.   Yes [provider]  Cholecalciferol (VITAMIN D -3 PO) Take 1 capsule by mouth daily.   Yes [provider]  doxazosin  (CARDURA ) 2 MG tablet Take 1 tablet (2 mg total) by mouth every evening. 02/11/24  Yes Walker, Caitlin S, NP  fluticasone  (FLONASE ) 50 MCG/ACT nasal spray SPRAY 1 SPRAY INTO BOTH NOSTRILS DAILY. 12/04/23  Yes Joshua Debby CROME, MD  lansoprazole  (PREVACID ) 15 MG capsule TAKE 1 CAPSULE (15 MG TOTAL) BY MOUTH 2 (TWO) TIMES DAILY BEFORE A MEAL. 03/30/23  Yes Joshua Debby CROME, MD  metoprolol  succinate (TOPROL -XL) 50 MG 24 hr tablet Take 1 tablet (50 mg total) by mouth daily. Take with or immediately following a meal. 11/05/23  Yes Walker, Caitlin S, NP  polyethylene glycol powder (GLYCOLAX/MIRALAX) 17 GM/SCOOP powder Take 17 g by mouth daily as needed for mild constipation or moderate constipation. 03/31/24  Yes Yukari Flax, Dorna HERO, FNP  PREMARIN vaginal cream Place 1 Application vaginally See admin instructions. Apply vaginally 2-3 times a week as directed 12/20/18  Yes [provider]  rosuvastatin  (CRESTOR ) 10 MG tablet TAKE 1 TABLET BY MOUTH EVERY DAY 02/11/24  Yes Joshua Debby CROME, MD  sertraline  (ZOLOFT ) 25 MG tablet Take 1 tablet (25 mg total) by mouth daily. 09/28/23  Yes Vannie Reche RAMAN, NP  spironolactone  (ALDACTONE ) 25 MG tablet  Take 0.5 tablets (12.5 mg total) by mouth daily. 01/19/24  Yes Vannie Reche RAMAN, NP  traZODone  (DESYREL ) 50 MG tablet Take 25 mg by mouth at bedtime.   Yes [provider]  acetaminophen  (TYLENOL ) 500 MG tablet Take 500 mg by mouth every 4 (four) hours as needed for mild pain or headache.    [provider]  albuterol  (VENTOLIN  HFA) 108 (90 Base) MCG/ACT inhaler TAKE 2 PUFFS BY MOUTH EVERY 6 HOURS AS NEEDED FOR WHEEZE OR SHORTNESS OF BREATH 02/11/24   Joshua Debby CROME, MD  ALPRAZolam  (XANAX ) 0.5 MG tablet TAKE 1 TABLET BY MOUTH AT BEDTIME. 04/16/23   Joshua Debby CROME, MD  cycloSPORINE  (RESTASIS ) 0.05 % ophthalmic emulsion Place 1 drop into both eyes 2 (two) times daily.    [provider]  losartan  (COZAAR ) 50 MG tablet Take 1 tablet (50 mg total) by mouth 2 (two) times daily. 11/05/23 02/11/24  Vannie Reche RAMAN, NP  ondansetron  (ZOFRAN -ODT) 4 MG disintegrating tablet TAKE 1 TABLET BY MOUTH EVERY 8 HOURS AS NEEDED FOR NAUSEA  AND VOMITING 07/06/23   Joshua Debby CROME, MD    Family History Family History  Problem Relation Age of Onset   Heart attack Father    Heart disease Father    Stroke Sister    Colon cancer Neg Hx     Social History Social History[1]   Allergies   Lipitor [atorvastatin], Zocor  [simvastatin ], Doxycycline , Fosamax [alendronate], Levaquin [levofloxacin], Nexium [esomeprazole], Omnicef  [cefdinir ], Penicillins, Pneumococcal vaccine, Pneumovax [pneumococcal polysaccharide vaccine], Prilosec [omeprazole], Norvasc  [amlodipine ], Shellfish allergy, and Shellfish protein-containing drug products   Review of Systems Review of Systems  Musculoskeletal:  Positive for back pain.  Per HPI   Physical Exam Triage Vital Signs ED Triage Vitals  Encounter Vitals Group     BP 03/31/24 1508 (!) 209/84     Girls Systolic BP Percentile --      Girls Diastolic BP Percentile --      Boys Systolic BP Percentile --      Boys Diastolic BP Percentile --      Pulse  Rate 03/31/24 1508 62     Resp 03/31/24 1508 16     Temp 03/31/24 1508 (!) 97.5 F (36.4 C)     Temp Source 03/31/24 1508 Oral     SpO2 03/31/24 1508 96 %     Weight --      Height --      Head Circumference --      Peak Flow --      Pain Score 03/31/24 1506 10     Pain Loc --      Pain Education --      Exclude from Growth Chart --    No data found.  Updated Vital Signs BP (!) 172/82 (BP Location: Right Arm)   Pulse 62   Temp (!) 97.5 F (36.4 C) (Oral)   Resp 16   SpO2 96%   Visual Acuity Right Eye Distance:   Left Eye Distance:   Bilateral Distance:    Right Eye Near:   Left Eye Near:    Bilateral Near:     Physical Exam Vitals and nursing note reviewed.  Constitutional:      Appearance: She is not ill-appearing or toxic-appearing.  HENT:     Head: Normocephalic and atraumatic.     Right Ear: Hearing and external ear normal.     Left Ear: Hearing and external ear normal.     Nose: Nose normal.     Mouth/Throat:     Lips: Pink.  Eyes:     General: Lids are normal. Vision grossly intact. Gaze aligned appropriately.     Extraocular Movements: Extraocular movements intact.     Conjunctiva/sclera: Conjunctivae normal.  Cardiovascular:     Rate and Rhythm: Normal rate and regular rhythm.     Heart sounds: Normal heart sounds, S1 normal and S2 normal.  Pulmonary:     Effort: Pulmonary effort is normal. No respiratory distress.     Breath sounds: Normal breath sounds and air entry.  Abdominal:     General: Bowel sounds are normal.     Palpations: Abdomen is soft.     Tenderness: There is no abdominal tenderness. There is no right CVA tenderness, left CVA tenderness or guarding.  Musculoskeletal:     Cervical back: Normal and neck supple.     Thoracic back: Normal.     Lumbar back: Tenderness present. No swelling, edema, deformity, signs of trauma, lacerations, spasms or bony tenderness. Normal range of motion. Negative right straight leg  raise test and  negative left straight leg raise test. No scoliosis.     Comments: Tender to palpation across the diffuse lumbar spine and lumbar paraspinous muscles.  Nontender to the midline spine.  Ambulatory steady gait without difficulty.  5/5 strength against resistance to bilateral lower extremities.  Sensation intact bilateral lower extremities.   Skin:    General: Skin is warm and dry.     Capillary Refill: Capillary refill takes less than 2 seconds.     Findings: No rash.  Neurological:     General: No focal deficit present.     Mental Status: She is alert and oriented to person, place, and time. Mental status is at baseline.     Cranial Nerves: No dysarthria or facial asymmetry.  Psychiatric:        Mood and Affect: Mood normal.        Speech: Speech normal.        Behavior: Behavior normal.        Thought Content: Thought content normal.        Judgment: Judgment normal.      UC Treatments / Results  Labs (all labs ordered are listed, but only abnormal results are displayed) Labs Reviewed  POCT URINE DIPSTICK - Abnormal; Notable for the following components:      Result Value   Ketones, POC UA trace (5) (*)    All other components within normal limits    EKG   Radiology No results found.  Procedures Procedures (including critical care time)  Medications Ordered in UC Medications - No data to display  Initial Impression / Assessment and Plan / UC Course  I have reviewed the triage vital signs and the nursing notes.  Pertinent labs & imaging results that were available during my care of the patient were reviewed by me and considered in my medical decision making (see chart for details).   1.  Acute bilateral low back pain without sciatica, constipation, elevated blood pressure reading in office with diagnosis of hypertension Presentation is consistent with low back pain secondary to constipation. Urinalysis is unremarkable for signs of UTI, she is not experiencing any  urinary symptoms. Low suspicion for musculoskeletal abnormality given atraumatic mechanism of pain to the low back, therefore deferred imaging of the low back.  No red flags on exam. I would like to trial treatment with MiraLAX 1 capful daily into 1 to 1-1/2 cups of water or juice to promote normal bowel movement.  She may use this once daily for the next 2 to 3 days until able to have a soft bowel movement, then as needed. Recommend increase water intake/fiber intake and follow-up with primary care provider in the next 3 to 4 days should symptoms fail to improve.   Blood pressure reduced on recheck, follow-up with cardiology/PCP for ongoing management of hypertension.  Counseled patient on potential for adverse effects with medications prescribed/recommended today, strict ER and return-to-clinic precautions discussed, patient verbalized understanding.    Final Clinical Impressions(s) / UC Diagnoses   Final diagnoses:  Acute bilateral low back pain without sciatica  Constipation, unspecified constipation type  Elevated blood pressure reading in office with diagnosis of hypertension     Discharge Instructions      Your low back pain is likely due to constipation.  Take 1 capful of miralax and place it into 1-1.5 cups of water or juice. Drink this mixture once daily for the next 2-3 days until you are able to have a normal  soft bowel movement.  You may take tylenol  650mg  every 6 hours as needed for back pain. Continue checking your BP at home and follow-up with your primary care provider for ongoing management/evaluation of your back pain.   If you develop any new or worsening symptoms or if your symptoms do not start to improve, please return here or follow-up with your primary care provider. If your symptoms are severe, please go to the emergency room.    ED Prescriptions     Medication Sig Dispense Auth. Provider   polyethylene glycol powder (GLYCOLAX/MIRALAX) 17 GM/SCOOP powder  Take 17 g by mouth daily as needed for mild constipation or moderate constipation. 255 g Enedelia Dorna HERO, FNP      PDMP not reviewed this encounter.    [1]  Social History Tobacco Use   Smoking status: Never    Passive exposure: Never   Smokeless tobacco: Never  Vaping Use   Vaping status: Never Used  Substance Use Topics   Alcohol use: No    Alcohol/week: 0.0 standard drinks of alcohol   Drug use: No     Enedelia Dorna HERO, FNP 03/31/24 1658  "

## 2024-04-05 ENCOUNTER — Encounter: Payer: Self-pay | Admitting: Gastroenterology

## 2024-04-05 ENCOUNTER — Ambulatory Visit: Admitting: Gastroenterology

## 2024-04-05 VITALS — BP 130/80 | HR 60 | Ht 65.0 in | Wt 147.1 lb

## 2024-04-05 DIAGNOSIS — K638219 Small intestinal bacterial overgrowth, unspecified: Secondary | ICD-10-CM

## 2024-04-05 DIAGNOSIS — R143 Flatulence: Secondary | ICD-10-CM | POA: Insufficient documentation

## 2024-04-05 DIAGNOSIS — K5909 Other constipation: Secondary | ICD-10-CM | POA: Diagnosis not present

## 2024-04-05 DIAGNOSIS — K5900 Constipation, unspecified: Secondary | ICD-10-CM | POA: Insufficient documentation

## 2024-04-05 DIAGNOSIS — R140 Abdominal distension (gaseous): Secondary | ICD-10-CM | POA: Insufficient documentation

## 2024-04-05 MED ORDER — RIFAXIMIN 550 MG PO TABS: 550.0000 mg | ORAL_TABLET | Freq: Three times a day (TID) | ORAL | 0 refills | Status: DC

## 2024-04-05 MED ORDER — DICYCLOMINE HCL 10 MG PO CAPS: 10.0000 mg | ORAL_CAPSULE | Freq: Two times a day (BID) | ORAL | 1 refills | Status: AC | PRN

## 2024-04-05 NOTE — Progress Notes (Signed)
 "    04/05/2024 JULEEN SORRELS [id number] June 19, 1929  Discussed the use of AI scribe software for clinical note transcription with the patient, who gave verbal consent to proceed.  History of Present Illness Krystal [name] is a 89 year old female with chronic bloating and constipation who presents for evaluation of recurrent abdominal discomfort with gas and bloating.  She is a patient of Dr. Darilyn.  Reports longstanding bloating with intermittent abdominal pain and increased gas. Passing gas is sometimes accompanied by passage of stool. On New Year's Day, experienced severe abdominal and back pain radiating to the hips, prompting an urgent care visit; pain resolved spontaneously. Describes these symptoms as similar to prior episodes.  Previously treated in 2021 for bloating and related symptoms with metronidazole , which was discontinued due to nausea, and believed that she took rifaximin  in the past, which was taken three times daily and found helpful.  Currently takes probiotics without relief. Uses dicyclomine  for abdominal spasms, which helps with pain and she would like that refilled.  Chronic constipation has recently worsened. Initially managed with stool softeners and Miralax , but Miralax  caused excessive pain and cramping, leading to discontinuation. Fiber supplements increased gas and bloating.  Previously used prunes, which are effective for bowel movements but do not fully break down in her digestive tract.  Weight has remained stable. Exercises regularly. Lives with her husband who is almost 38 years old.   Past Medical History:  Diagnosis Date   Adenomatous colon polyp    Allergy    seasonal   Anemia    pt. denies   Anxiety    CAD (coronary artery disease)    Cataract    cataracts removed left eye.   Chronic lower back pain    Diverticulosis    GERD (gastroesophageal reflux disease)    Headache(784.0)    Hiatal hernia    Hyperlipidemia    Hypertension    IBS  (irritable bowel syndrome) 10/13/2011   Osteoarthritis    Osteoporosis    Renal cysts, acquired, bilateral    SBO (small bowel obstruction) (HCC) 09/05/2015   Venous insufficiency    Past Surgical History:  Procedure Laterality Date   ABDOMINAL HYSTERECTOMY     BLADDER REPAIR     CARDIAC CATHETERIZATION  01/12/2002   90% stenosis of the left circumflex, stented with a 3x9mm Cypher stent, resulting in reduction of 90% to 10%    CARDIOVASCULAR STRESS TEST  03/22/2007   Mild inferolateral thinning toward the apex without significant ischemia. Nondiagnostic electrocardiogram.   CATARACT EXTRACTION     bilateral   COLONOSCOPY  08/17/2008   adenomatous polyp, diverticulosis, external hemorrhoids   COLONOSCOPY W/ BIOPSIES     multiple    LEFT LOWER EXTREMITY VENOUS DOPPLER  06/18/2011   No evidence of left lower extremity DVT   TRANSTHORACIC ECHOCARDIOGRAM  11/18/2012   EF 40-45%, LV systolic mild-moderately reduced, mild-moderate mitral valve regurg, mild-moderate tricuspid valve regurg. NSR-LBBB with occas PVCs   UPPER GASTROINTESTINAL ENDOSCOPY  07/03/2010   hiatal hernia   VARICOSE VEIN SURGERY      reports that she has never smoked. She has never been exposed to tobacco smoke. She has never used smokeless tobacco. She reports that she does not drink alcohol and does not use drugs. family history includes Heart attack in her father; Heart disease in her father; Stroke in her sister. Allergies[1]    Outpatient Encounter Medications as of 04/05/2024  Medication Sig   acetaminophen  (TYLENOL ) 500 MG tablet  Take 500 mg by mouth every 4 (four) hours as needed for mild pain or headache.   albuterol  (VENTOLIN  HFA) 108 (90 Base) MCG/ACT inhaler TAKE 2 PUFFS BY MOUTH EVERY 6 HOURS AS NEEDED FOR WHEEZE OR SHORTNESS OF BREATH   ALPRAZolam  (XANAX ) 0.5 MG tablet TAKE 1 TABLET BY MOUTH AT BEDTIME.   aspirin  EC 81 MG tablet Take 81 mg by mouth daily.   Calcium  Carbonate Antacid (CALCIUM  CARBONATE PO)  Take 1 tablet by mouth daily.   Cholecalciferol (VITAMIN D -3 PO) Take 1 capsule by mouth daily.   cycloSPORINE  (RESTASIS ) 0.05 % ophthalmic emulsion Place 1 drop into both eyes 2 (two) times daily.   doxazosin  (CARDURA ) 2 MG tablet Take 1 tablet (2 mg total) by mouth every evening.   fluticasone  (FLONASE ) 50 MCG/ACT nasal spray SPRAY 1 SPRAY INTO BOTH NOSTRILS DAILY.   lansoprazole  (PREVACID ) 15 MG capsule TAKE 1 CAPSULE (15 MG TOTAL) BY MOUTH 2 (TWO) TIMES DAILY BEFORE A MEAL.   losartan  (COZAAR ) 50 MG tablet Take 1 tablet (50 mg total) by mouth 2 (two) times daily.   metoprolol  succinate (TOPROL -XL) 50 MG 24 hr tablet Take 1 tablet (50 mg total) by mouth daily. Take with or immediately following a meal.   ondansetron  (ZOFRAN -ODT) 4 MG disintegrating tablet TAKE 1 TABLET BY MOUTH EVERY 8 HOURS AS NEEDED FOR NAUSEA AND VOMITING   polyethylene glycol powder (GLYCOLAX /MIRALAX ) 17 GM/SCOOP powder Take 17 g by mouth daily as needed for mild constipation or moderate constipation.   PREMARIN vaginal cream Place 1 Application vaginally See admin instructions. Apply vaginally 2-3 times a week as directed   rosuvastatin  (CRESTOR ) 10 MG tablet TAKE 1 TABLET BY MOUTH EVERY DAY   sertraline  (ZOLOFT ) 25 MG tablet Take 1 tablet (25 mg total) by mouth daily.   spironolactone  (ALDACTONE ) 25 MG tablet Take 0.5 tablets (12.5 mg total) by mouth daily.   traZODone  (DESYREL ) 50 MG tablet Take 25 mg by mouth at bedtime.   No facility-administered encounter medications on file as of 04/05/2024.     REVIEW OF SYSTEMS  : All other systems reviewed and negative except where noted in the History of Present Illness.   PHYSICAL EXAM: BP 130/80   Pulse 60   Ht 5' 5 (1.651 m)   Wt 147 lb 2 oz (66.7 kg)   BMI 24.48 kg/m  General: Well developed white female in no acute distress Head: Normocephalic and atraumatic Eyes:  Sclerae anicteric, conjunctiva pink. Ears: Normal auditory acuity Lungs: Clear throughout to  auscultation; no W/R/R. Heart: Regular rate and rhythm; no M/R/G. Abdomen: Soft, non-distended.  BS present.  Mild diffuse TTP. Musculoskeletal: Symmetrical with no gross deformities  Skin: No lesions on visible extremities Extremities: No edema  Neurological: Alert oriented x 4, grossly non-focal Psychological:  Alert and cooperative. 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 - Prescribed rifaximin  550 mg orally three times daily for two weeks. - Instructed her to contact the office or send a MyChart message approximately one week after completing therapy to report on symptom improvement.  Chronic constipation Chronic, intermittently worsening constipation with intolerance to fiber and full-dose polyethylene glycol, requiring careful management. - Recommended trial of Senokot, starting with one tablet in the evening and titrating as needed. - Otherwise advised adjustment of polyethylene glycol  dosing to a half capful every other day if needed, with monitoring for cramping or pain.   CC:  Joshua Debby CROME, MD       [1]  Allergies Allergen Reactions   Lipitor [Atorvastatin] Other (See Comments)    Myalgias    Zocor  [Simvastatin ] Other (See Comments)    Myalgias    Doxycycline      diarrhea   Fosamax [Alendronate] Other (See Comments)    Esophagitis   Levaquin [Levofloxacin] Diarrhea   Nexium [Esomeprazole] Nausea Only and Other (See Comments)    Stomach pains/burning sensation   Omnicef  [Cefdinir ] Diarrhea   Penicillins Hives   Pneumococcal Vaccine Other (See Comments)    Fatigue and aches that lasted 6+ months   Pneumovax [Pneumococcal Polysaccharide Vaccine]    Prilosec [Omeprazole] Nausea Only and Other  (See Comments)    Stomach pains/burning sensation   Norvasc  [Amlodipine ] Swelling    Ankle edema   Shellfish Allergy Hives, Nausea Only and Rash   Shellfish Protein-Containing Drug Products Hives, Nausea Only and Rash   "

## 2024-04-05 NOTE — Patient Instructions (Addendum)
 We have sent the following medications to your pharmacy for you to pick up at your convenience: Dicyclomine  10 mg twice daily as needed. Xifixan 550 mg three times daily for 14 days.   May use OTC Senokot for constipation as directed.  _______________________________________________________  If your blood pressure at your visit was 140/90 or greater, please contact your primary care physician to follow up on this.  _______________________________________________________  If you are age 89 or older, your body mass index should be between 23-30. Your Body mass index is 24.48 kg/m. If this is out of the aforementioned range listed, please consider follow up with your Primary Care Provider.  If you are age 89 or younger, your body mass index should be between 19-25. Your Body mass index is 24.48 kg/m. If this is out of the aformentioned range listed, please consider follow up with your Primary Care Provider.   ________________________________________________________  The [facility] providers would like to encourage you to use MYCHART to communicate with providers for non-urgent requests or questions.  Due to long hold times on the telephone, sending your provider a message by [facility] may be a faster and more efficient way to get a response.  Please allow 48 business hours for a response.  Please remember that this is for non-urgent requests.  _______________________________________________________  [facility] is using a team-based approach to care.  Your team is made up of your doctor and two to three APPS. Our APPS (Nurse Practitioners and Physician Assistants) work with your physician to ensure care continuity for you. They are fully qualified to address your health concerns and develop a treatment plan. They communicate directly with your gastroenterologist to care for you. Seeing the Advanced Practice Practitioners on your physician's team can help you by facilitating care more  promptly, often allowing for earlier appointments, access to diagnostic testing, procedures, and other specialty referrals.

## 2024-04-06 ENCOUNTER — Telehealth: Payer: Self-pay | Admitting: Gastroenterology

## 2024-04-06 NOTE — Telephone Encounter (Signed)
 Please advise

## 2024-04-06 NOTE — Telephone Encounter (Signed)
 Inbound call from patient stating that she was seen with Harlene yesterday. She states she was proscribed Xifaxan  and its going to cost her too much. Patient is requesting a alternative medication and is requesting to get it today. Please advise.

## 2024-04-07 NOTE — Telephone Encounter (Signed)
 Cal from pt following up on medication request from yesterday 01/07. Pt requesting a call for confirmation of new medication.  Please advise.Thank you.

## 2024-04-08 ENCOUNTER — Other Ambulatory Visit (HOSPITAL_COMMUNITY): Payer: Self-pay

## 2024-04-08 MED ORDER — RIFAXIMIN 550 MG PO TABS: 550.0000 mg | ORAL_TABLET | Freq: Three times a day (TID) | ORAL | 0 refills | Status: AC

## 2024-04-08 MED ORDER — METRONIDAZOLE 250 MG PO TABS: 250.0000 mg | ORAL_TABLET | Freq: Three times a day (TID) | ORAL | 0 refills | Status: AC

## 2024-04-08 NOTE — Telephone Encounter (Signed)
 Patient would like to try something other than Xifaxan . Please advise.

## 2024-04-08 NOTE — Telephone Encounter (Addendum)
 Patient states the copay is $800 and doesn't think she can afford this.

## 2024-04-08 NOTE — Telephone Encounter (Signed)
 Spoke with pharmacy and Xifaxan  need prior authorization. Please submit for IBS-D.

## 2024-04-08 NOTE — Addendum Note (Signed)
 Addended by: WILL POWELL CROME on: 04/08/2024 05:00 PM   Modules accepted: Orders

## 2024-04-08 NOTE — Telephone Encounter (Signed)
 Informed patient of possible side effects but patient states she is in pain and does not want to wait on Xifaxan . Flagyl  sent to pharmacy.

## 2024-04-11 ENCOUNTER — Telehealth: Payer: Self-pay

## 2024-04-11 NOTE — Telephone Encounter (Signed)
 Pharmacy Patient Advocate Encounter   Received notification from Pt Calls Messages that prior authorization for Xifaxan  550MG  tablets is required/requested.   Insurance verification completed.   The patient is insured through [facility] [employer].   We are currently unable to proceed with a prior authorization for Xifaxan  for IBS-D due to a lack of clinical documentation supporting this diagnosis. The most recent visit notes reflect constipation rather than diarrhea. If there is updated or additional information to support IBS-D, please let us  know so we may proceed.

## 2024-04-11 NOTE — Telephone Encounter (Signed)
 Krystal [name] see the note regarding Xifaxan 

## 2024-04-11 NOTE — Telephone Encounter (Signed)
 See alternate note pt taking flagyl 

## 2024-04-24 ENCOUNTER — Other Ambulatory Visit (HOSPITAL_BASED_OUTPATIENT_CLINIC_OR_DEPARTMENT_OTHER): Payer: Self-pay | Admitting: Family

## 2024-04-24 DIAGNOSIS — I10 Essential (primary) hypertension: Secondary | ICD-10-CM

## 2024-04-27 ENCOUNTER — Other Ambulatory Visit: Payer: Self-pay | Admitting: Gastroenterology

## 2024-04-27 ENCOUNTER — Other Ambulatory Visit: Payer: Self-pay | Admitting: Internal Medicine

## 2024-04-27 DIAGNOSIS — M5030 Other cervical disc degeneration, unspecified cervical region: Secondary | ICD-10-CM

## 2024-04-30 ENCOUNTER — Other Ambulatory Visit: Payer: Self-pay | Admitting: Internal Medicine

## 2024-06-01 ENCOUNTER — Encounter (HOSPITAL_BASED_OUTPATIENT_CLINIC_OR_DEPARTMENT_OTHER): Admitting: Cardiovascular Disease
